# Patient Record
Sex: Male | Born: 1940 | Race: White | Hispanic: No | Marital: Married | Smoking: Former smoker
Health system: Southern US, Community
[De-identification: ages and names within clinical notes are randomized; demographics above are authoritative.]

## PROBLEM LIST (undated history)

## (undated) DIAGNOSIS — M1990 Unspecified osteoarthritis, unspecified site: Secondary | ICD-10-CM

## (undated) DIAGNOSIS — I2510 Atherosclerotic heart disease of native coronary artery without angina pectoris: Secondary | ICD-10-CM

## (undated) DIAGNOSIS — E291 Testicular hypofunction: Secondary | ICD-10-CM

## (undated) DIAGNOSIS — N529 Male erectile dysfunction, unspecified: Secondary | ICD-10-CM

## (undated) DIAGNOSIS — I739 Peripheral vascular disease, unspecified: Secondary | ICD-10-CM

## (undated) DIAGNOSIS — E119 Type 2 diabetes mellitus without complications: Secondary | ICD-10-CM

## (undated) DIAGNOSIS — I10 Essential (primary) hypertension: Secondary | ICD-10-CM

## (undated) DIAGNOSIS — E785 Hyperlipidemia, unspecified: Secondary | ICD-10-CM

## (undated) HISTORY — DX: Atherosclerotic heart disease of native coronary artery without angina pectoris: I25.10

## (undated) HISTORY — DX: Testicular hypofunction: E29.1

## (undated) HISTORY — DX: Essential (primary) hypertension: I10

## (undated) HISTORY — DX: Hyperlipidemia, unspecified: E78.5

## (undated) HISTORY — DX: Type 2 diabetes mellitus without complications: E11.9

## (undated) HISTORY — DX: Peripheral vascular disease, unspecified: I73.9

## (undated) HISTORY — DX: Unspecified osteoarthritis, unspecified site: M19.90

## (undated) HISTORY — DX: Male erectile dysfunction, unspecified: N52.9

---

## 2003-05-20 HISTORY — PX: INGUINAL HERNIA REPAIR: SUR1180

## 2004-01-03 ENCOUNTER — Encounter: Admission: RE | Admit: 2004-01-03 | Discharge: 2004-01-03 | Payer: Self-pay | Admitting: Surgery

## 2004-01-05 ENCOUNTER — Ambulatory Visit (HOSPITAL_COMMUNITY): Admission: RE | Admit: 2004-01-05 | Discharge: 2004-01-05 | Payer: Self-pay | Admitting: Surgery

## 2004-01-05 ENCOUNTER — Ambulatory Visit (HOSPITAL_BASED_OUTPATIENT_CLINIC_OR_DEPARTMENT_OTHER): Admission: RE | Admit: 2004-01-05 | Discharge: 2004-01-05 | Payer: Self-pay | Admitting: Surgery

## 2004-12-26 ENCOUNTER — Inpatient Hospital Stay (HOSPITAL_COMMUNITY): Admission: EM | Admit: 2004-12-26 | Discharge: 2005-01-01 | Payer: Self-pay | Admitting: Emergency Medicine

## 2004-12-26 HISTORY — PX: CARDIAC CATHETERIZATION: SHX172

## 2004-12-27 HISTORY — PX: CORONARY ARTERY BYPASS GRAFT: SHX141

## 2005-01-27 ENCOUNTER — Encounter
Admission: RE | Admit: 2005-01-27 | Discharge: 2005-01-27 | Payer: Self-pay | Admitting: Thoracic Surgery (Cardiothoracic Vascular Surgery)

## 2005-12-13 IMAGING — CR DG CHEST 2V
2 series · 2 of 2 positions shown · non-contrast
Comparison: none

CLINICAL DATA: 63 year old with bypass surgery.
 TWO VIEW CHEST:
 Two views of the chest are compared to prior films from 12/31/04 demonstrating stable surgical changes related to double bypass surgery.   Much improved basilar aeration with resolution of atelectasis.  No effusions are seen.

[view not recorded (1 of 2)]
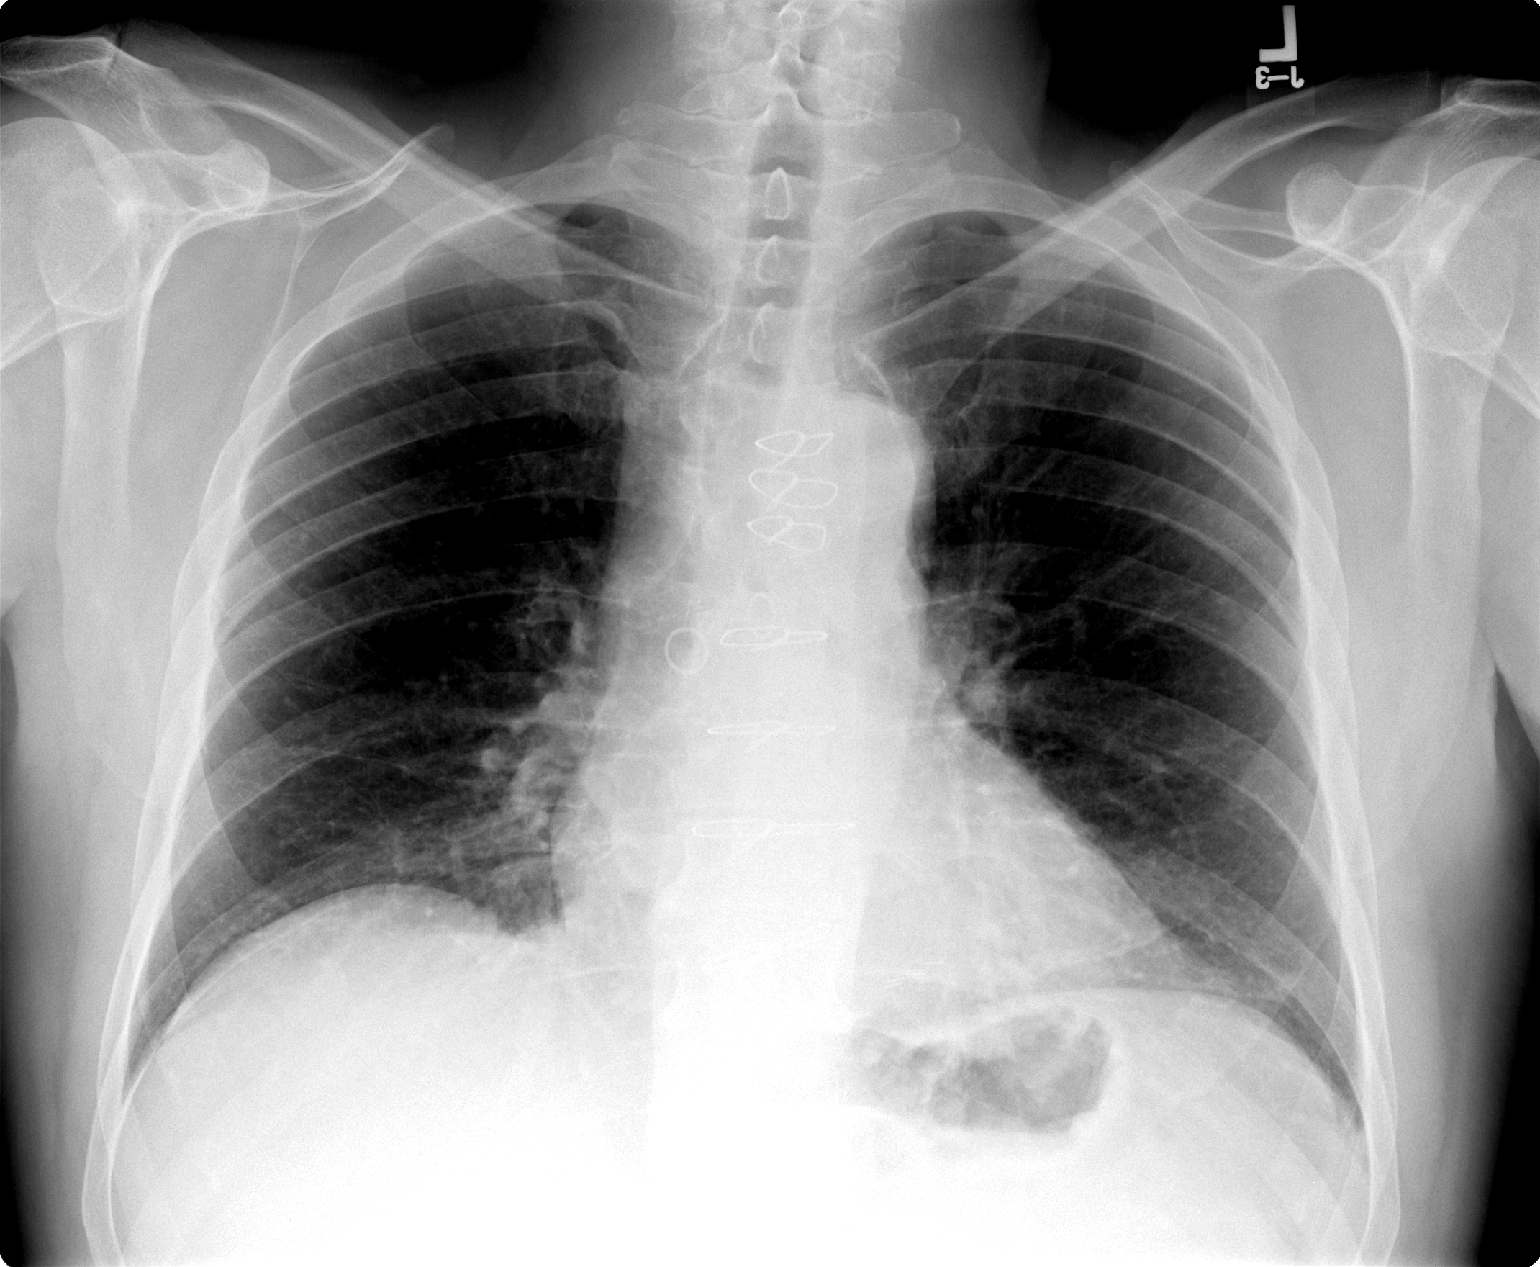

[view not recorded (2 of 2)]
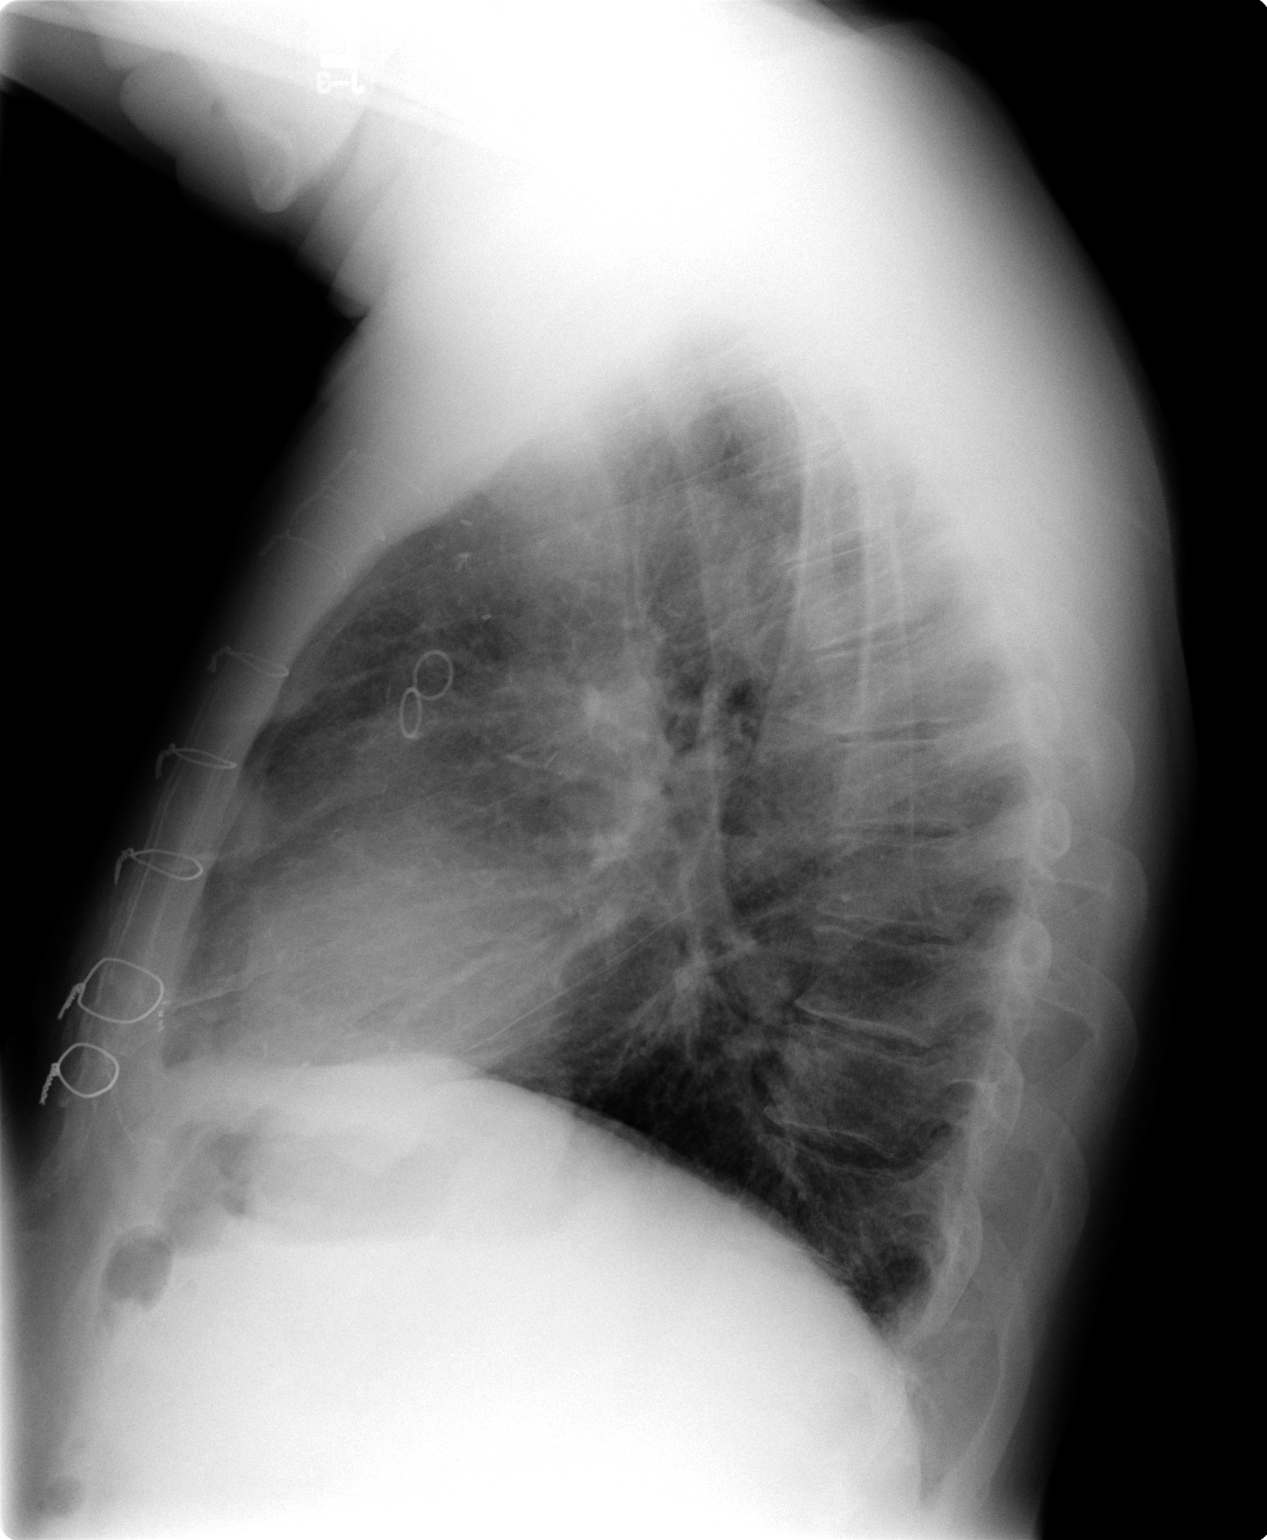

[2 of 2 positions shown; findings below may reference images not displayed]

IMPRESSION: 1.  Surgical changes related to bypass surgery.
 2.  Resolution of basilar atelectasis.
 3.  No edema or infiltrate.

## 2011-06-27 DIAGNOSIS — I739 Peripheral vascular disease, unspecified: Secondary | ICD-10-CM

## 2011-06-27 DIAGNOSIS — I2510 Atherosclerotic heart disease of native coronary artery without angina pectoris: Secondary | ICD-10-CM

## 2011-06-27 HISTORY — DX: Peripheral vascular disease, unspecified: I73.9

## 2011-06-27 HISTORY — DX: Atherosclerotic heart disease of native coronary artery without angina pectoris: I25.10

## 2012-09-30 ENCOUNTER — Encounter: Payer: Self-pay | Admitting: Cardiovascular Disease

## 2012-10-01 ENCOUNTER — Ambulatory Visit (INDEPENDENT_AMBULATORY_CARE_PROVIDER_SITE_OTHER): Payer: PRIVATE HEALTH INSURANCE | Admitting: Cardiovascular Disease

## 2012-10-01 ENCOUNTER — Encounter: Payer: Self-pay | Admitting: Cardiovascular Disease

## 2012-10-01 VITALS — BP 130/80 | HR 67 | Ht 72.0 in | Wt 221.0 lb

## 2012-10-01 DIAGNOSIS — I10 Essential (primary) hypertension: Secondary | ICD-10-CM | POA: Insufficient documentation

## 2012-10-01 DIAGNOSIS — E785 Hyperlipidemia, unspecified: Secondary | ICD-10-CM | POA: Insufficient documentation

## 2012-10-01 DIAGNOSIS — Z951 Presence of aortocoronary bypass graft: Secondary | ICD-10-CM | POA: Insufficient documentation

## 2012-10-01 DIAGNOSIS — I2510 Atherosclerotic heart disease of native coronary artery without angina pectoris: Secondary | ICD-10-CM

## 2012-10-01 DIAGNOSIS — I739 Peripheral vascular disease, unspecified: Secondary | ICD-10-CM

## 2012-10-01 NOTE — Assessment & Plan Note (Signed)
The patient denies claudication. He does have symptoms of sciatica. His left lower a Doppler study performed in our office 06/27/11 revealed a right ABI of 1.1 and a left of 0.89 with a high-frequency signal in his proximal left SFA. We will repeat lower extremity arterial Dopplers

## 2012-10-01 NOTE — Assessment & Plan Note (Signed)
Intolerant to statin drugs. His last lipid profile performed in October of last year revealed a total cholesterol of 206, LDL of 140, HDL of 36.

## 2012-10-01 NOTE — Assessment & Plan Note (Signed)
The patient had a nonischemic Myoview 06/27/11 he denies chest pain or shortness of breath. Continue medical therapy will be recommended.

## 2012-10-01 NOTE — Progress Notes (Signed)
10/01/2012 [name]   April 30, 1941  [id number]  Primary Physician Richard Puffer, MD Primary Cardiologist: [name] M.D. FACC  HPI:  The patient returns today for 58-month followup. He is accompanied by his wife today. 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 Ashley Lloyd December 27, 2004, for left main 3-vessel disease. He had a LIMA to his LAD, a vein to his circumflex marginal branch as well as to the PDA. His other problems include hypertension and hyperlipidemia. He denies chest pain or shortness of breath. He does have left ABI in the 0.89 range with a high-frequency signal in his left SFA but denies claudication. He does have what sounds like sciatica in his right hip. His last lipid profile performed in March revealed a total cholesterol 191, LDL of 130 and HDL of 35. He did have side effects to his Crestor. I had started him on Pravachol because of the abnormal profile which the edition was intolerant to chest pain or shortness of breath. His last Myoview performed a year ago is nonischemic.    Current Outpatient Prescriptions  Medication Sig Dispense Refill  . aspirin 81 MG tablet Take 81 mg by mouth daily.      Marland Kitchen lisinopril (PRINIVIL,ZESTRIL) 20 MG tablet Take 20 mg by mouth daily.      . metoprolol (LOPRESSOR) 50 MG tablet Take 50 mg by mouth at bedtime.      . Omega-3 Fatty Acids (FISH OIL) 1200 MG CAPS Take 1,200 capsules by mouth every morning.      . Testosterone (AXIRON) 30 MG/ACT SOLN Place 30 mg onto the skin 2 (two) times daily.       No current facility-administered medications for this visit.    Allergies  Allergen Reactions  . Crestor (Rosuvastatin) Other (See Comments)    Myalgias    History   Social History  . Marital Status: Legally Separated    Spouse Name: N/A    Number of Children: N/A  . Years of Education: N/A    Occupational History  . Not on file.   Social History Main Topics  . Smoking status: Former Smoker -- 1.00 packs/day for 50 years    Types: Cigarettes    Quit date: 12/17/2004  . Smokeless tobacco: Not on file  . Alcohol Use: Yes  . Drug Use: Not on file  . Sexually Active: Not on file   Other Topics Concern  . Not on file   Social History Narrative  . No narrative on file     Review of Systems: General: negative for chills, fever, night sweats or weight changes.  Cardiovascular: negative for chest pain, dyspnea on exertion, edema, orthopnea, palpitations, paroxysmal nocturnal dyspnea or shortness of breath Dermatological: negative for rash Respiratory: negative for cough or wheezing Urologic: negative for hematuria Abdominal: negative for nausea, vomiting, diarrhea, bright red blood per rectum, melena, or hematemesis Neurologic: negative for visual changes, syncope, or dizziness All other systems reviewed and are otherwise negative except as noted above.    Blood pressure 130/80, pulse 67, height 6' (1.829 m), weight 221 lb (100.245 kg).  General appearance: alert and no distress Neck: no adenopathy, no carotid bruit, no JVD, supple, symmetrical, trachea midline and thyroid not enlarged, symmetric, no tenderness/mass/nodules Lungs: clear to auscultation bilaterally Heart: regular rate and rhythm, S1, S2 normal, no murmur, click, rub or gallop Extremities: extremities  normal, atraumatic, no cyanosis or edema  EKG normal sinus rhythm at 67 without ST or T wave changes  ASSESSMENT AND PLAN:   CAD in native artery The patient had a nonischemic Myoview 06/27/11 he denies chest pain or shortness of breath. Continue medical therapy will be recommended.  HTN (hypertension) Well-controlled on current medications  Hyperlipidemia Intolerant to statin drugs. His last lipid profile performed in October of last year revealed a total cholesterol of 206, LDL of 140, HDL of  36.  Peripheral vascular disease The patient denies claudication. He does have symptoms of sciatica. His left lower a Doppler study performed in our office 06/27/11 revealed a right ABI of 1.1 and a left of 0.89 with a high-frequency signal in his proximal left SFA. We will repeat lower extremity arterial Dopplers      [name] JMD, [name], FSCAI 10/01/2012 8:56 AM

## 2012-10-01 NOTE — Patient Instructions (Signed)
  Your physician wants you to follow-up in: 6 MONTHS WITH AN EXTENDER AND 12 MONTHS WITH DR Allyson Sabal. You will receive a reminder letter in the mail one month in advance. If you don't receive a letter, please call our office to schedule the follow-up appointment.   NO CHANGES IN YOUR MEDICATION   Your physician has ordered the following tests: LOWER EXTREMITY DOPPLER

## 2012-10-01 NOTE — Assessment & Plan Note (Signed)
Well-controlled on current medications 

## 2012-10-20 ENCOUNTER — Ambulatory Visit (HOSPITAL_COMMUNITY)
Admission: RE | Admit: 2012-10-20 | Discharge: 2012-10-20 | Disposition: A | Discharge status: Home / Self Care | Payer: PRIVATE HEALTH INSURANCE | Source: Ambulatory Visit | Attending: Cardiovascular Disease | Admitting: Cardiovascular Disease

## 2012-10-20 DIAGNOSIS — I739 Peripheral vascular disease, unspecified: Secondary | ICD-10-CM | POA: Insufficient documentation

## 2012-10-20 NOTE — Progress Notes (Signed)
Arterial Duplex Completed. [name]  

## 2012-10-25 ENCOUNTER — Telehealth: Payer: Self-pay | Admitting: Cardiovascular Disease

## 2012-10-25 NOTE — Telephone Encounter (Signed)
Message forwarded to K. Vogel, RN.  

## 2012-10-25 NOTE — Telephone Encounter (Signed)
lmom 

## 2012-10-25 NOTE — Telephone Encounter (Signed)
Would like doppler test results from Wednesday please

## 2012-10-27 NOTE — Telephone Encounter (Signed)
Returning your call. °

## 2012-11-01 ENCOUNTER — Encounter: Payer: Self-pay | Admitting: *Deleted

## 2012-11-01 ENCOUNTER — Telehealth: Payer: Self-pay | Admitting: *Deleted

## 2012-11-01 DIAGNOSIS — I739 Peripheral vascular disease, unspecified: Secondary | ICD-10-CM

## 2012-11-01 NOTE — Telephone Encounter (Signed)
Message copied by Marella Bile on Mon Nov 01, 2012 11:16 AM ------      Message from: [name]      Created: Mon Oct 25, 2012  8:00 PM       No change from prior study. Repeat in 12 months. ------

## 2012-11-01 NOTE — Telephone Encounter (Signed)
Order placed for repeat doppler in 1 year 

## 2012-11-01 NOTE — Telephone Encounter (Signed)
Letter mailed with results. 

## 2013-05-27 ENCOUNTER — Ambulatory Visit (INDEPENDENT_AMBULATORY_CARE_PROVIDER_SITE_OTHER): Payer: PRIVATE HEALTH INSURANCE | Admitting: Cardiovascular Disease

## 2013-05-27 ENCOUNTER — Encounter: Payer: Self-pay | Admitting: Cardiovascular Disease

## 2013-05-27 VITALS — BP 130/80 | HR 78 | Ht 72.0 in | Wt 221.0 lb

## 2013-05-27 DIAGNOSIS — I2510 Atherosclerotic heart disease of native coronary artery without angina pectoris: Secondary | ICD-10-CM

## 2013-05-27 DIAGNOSIS — Z79899 Other long term (current) drug therapy: Secondary | ICD-10-CM

## 2013-05-27 DIAGNOSIS — E785 Hyperlipidemia, unspecified: Secondary | ICD-10-CM

## 2013-05-27 NOTE — Progress Notes (Signed)
Patient ID: [name], male   DOB: 04-12-1941, 73 y.o.   MRN: [id number]  Chief Complaint  Patient presents with  . Follow-up    ROV 6 months, no chest pain  . Tingling    left arm that radiates down to the fingers    HPI [name] is a 73 y.o. male with h/o CAD (status post coronary artery bypass grafting by Dr. [name] December 27, 2004, for left main 3-vessel disease. He had a LIMA to his LAD, a vein to his circumflex marginal branch as well as to the PDA). He also has h/o PVD (left ABI:0.89 with high frequency signal in left SFA, but asymptomatic), HTN and hyperlipidemia.   He reports feeling well. He denies any chest pain, shortness of breath, palpitations or lower extremity edema. He denies any claudication. 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 He doesn't exercise on a regular basis.  He has not been taking pravastatin for over a year due to myalgias. Additionally, his lisinopril was stopped a year ago due to persistent cough.   Last lexiscan myoview was 06/27/11 which was normal.  Last lipid panel was on 03/03/2012 and showed LDL: 140, HDL: 36, trig: 148 and total chol: 206 Past Medical History  Diagnosis Date  . Coronary artery disease 06/27/2011    Normal Lexiscan, EF 64, post-stress EF 64, no EKG changes  . Hyperlipidemia   . Hypertension   . Claudication 06/27/2011    Left ABIs abnormal values with mild-moderate arterial insufficiency, right SFA moderate amount of mixed density plaque elevating velocities suggestive 50-69% diameter reduction  . PVD (peripheral vascular disease)   . Osteoarthritis   . Impotence of organic origin   . Other testicular hypofunction     Past Surgical History  Procedure Laterality Date  . Cardiac catheterization  12/26/2004    Left main-90% distal tapered stenosis, left circumflex-95% ostial stenosis, first marginal branch 99% ostial branch, posterior descending artery 99% ostial stenosis, 60-70% segmental mid stenosis, needed CABG  .  Inguinal hernia repair Left   . Coronary artery bypass graft  12/27/2004    Left main 3-vessel disease, LIMA to LAD, saphenous vein to circumflexmarginal branch and to the PDA    Family History  Problem Relation Age of Onset  . Hypertension Mother     67  . Clotting disorder Father     77    Social History History  Substance Use Topics  . Smoking status: Former Smoker -- 1.00 packs/day for 50 years    Types: Cigarettes    Quit date: 12/17/2004  . Smokeless tobacco: Not on file  . Alcohol Use: Yes    Allergies  Allergen Reactions  . Crestor [Rosuvastatin] Other (See Comments)    Myalgias  . Lisinopril     cough    Current Outpatient Prescriptions  Medication Sig Dispense Refill  . aspirin 81 MG tablet Take 81 mg by mouth daily.      . metoprolol (LOPRESSOR) 50 MG tablet Take 50 mg by mouth at bedtime.      . naproxen sodium (ANAPROX) 220 MG tablet Take 440 mg by mouth at bedtime.      . Omega-3 Fatty Acids (FISH OIL) 1200 MG CAPS Take 1,200 capsules by mouth every morning.      . Testosterone (AXIRON) 30 MG/ACT SOLN Place 30 mg onto the skin 2 (two) times daily.      . Vitamin D, Ergocalciferol, (DRISDOL) 50000 UNITS CAPS capsule Take 50,000 Units  by mouth every 7 (seven) days.       No current facility-administered medications for this visit.    Review of Systems Review of Systems Negative except per HPI Blood pressure 130/80, pulse 78, height 6' (1.829 m), weight 221 lb (100.245 kg).  Physical Exam Physical Exam General: no acute distress, alert and oriented x3, pleasant elderly white male HEENT: no JVD, no carotid murmur appreciated CV: S1S2, RRR, no murmur appreciated Pulm: clear to auscultation bilaterally, normal work of breathing, no wheezing or crackles.  Extr: no edema, dorsalis pedis pulse difficult to palpate bilaterally Data Reviewed EKG: normal sinus rhythm rate 78  Assessment/Plan:  73 y.o. male with h/o CAD, asymptomatic PVD, HTN and  hyperlipidemia.    1. H/o CAD: s/p CABG in 2006. Asymptomatic. Not currently on statin due to intolerance.  - recheck lipid panel with LFTs - repeat stress test in 1 year (last one in 06/2011 and normal)  2. Hypertension:  - controled on metoprolol 50mg   - acei stopped due to cough  3. Hyperlipidemia: not on medical therapy due to intolerance - repeat lipid panel  4. PVD: asymptomatic - continue to monitor - continue ASA   Follow up in 6-8 months        Suzanne Garbers 05/27/2013, 11:02 AM Family Medicine Resident, PGY-3  I have seen examined and evaluated the patient with Dr. Gwenlyn SaranLosq and agree with her assessment and plan.  Runell GessJonathan J. Berry, M.D., FACP, [facility]CFACC, Earl LagosFAHA, [facility] HospitalFSCAI [facility] [address] [place], [place]  [address]  [phone] 05/27/2013 4:10 PM

## 2013-05-27 NOTE — Patient Instructions (Signed)
  We will see you back in follow up in November 2015  Dr Allyson SabalBerry has ordered blood work to be done at your convenience, fasting

## 2013-06-03 LAB — HEPATIC FUNCTION PANEL
ALBUMIN: 4 g/dL (ref 3.5–5.2)
ALK PHOS: 68 U/L (ref 39–117)
ALT: 20 U/L (ref 0–53)
AST: 21 U/L (ref 0–37)
BILIRUBIN INDIRECT: 1.2 mg/dL — AB (ref 0.0–0.9)
BILIRUBIN TOTAL: 1.5 mg/dL — AB (ref 0.3–1.2)
Bilirubin, Direct: 0.3 mg/dL (ref 0.0–0.3)
TOTAL PROTEIN: 6.9 g/dL (ref 6.0–8.3)

## 2013-06-03 LAB — LIPID PANEL
CHOL/HDL RATIO: 5.4 ratio
Cholesterol: 161 mg/dL (ref 0–200)
HDL: 30 mg/dL — ABNORMAL LOW (ref >39)
LDL Cholesterol: 114 mg/dL — ABNORMAL HIGH (ref 0–99)
Triglycerides: 84 mg/dL (ref <150)
VLDL: 17 mg/dL (ref 0–40)

## 2013-10-19 ENCOUNTER — Telehealth (HOSPITAL_COMMUNITY): Payer: Self-pay | Admitting: *Deleted

## 2013-11-01 ENCOUNTER — Telehealth (HOSPITAL_COMMUNITY): Payer: Self-pay | Admitting: *Deleted

## 2016-06-30 ENCOUNTER — Encounter: Payer: Self-pay | Admitting: *Deleted

## 2016-07-01 ENCOUNTER — Ambulatory Visit (INDEPENDENT_AMBULATORY_CARE_PROVIDER_SITE_OTHER): Payer: Medicare HMO | Admitting: Diagnostic Neuroimaging

## 2016-07-01 ENCOUNTER — Encounter: Payer: Self-pay | Admitting: Diagnostic Neuroimaging

## 2016-07-01 VITALS — BP 163/74 | HR 61 | Ht 73.0 in | Wt 222.4 lb

## 2016-07-01 DIAGNOSIS — I739 Peripheral vascular disease, unspecified: Secondary | ICD-10-CM

## 2016-07-01 DIAGNOSIS — M79605 Pain in left leg: Secondary | ICD-10-CM | POA: Diagnosis not present

## 2016-07-01 DIAGNOSIS — M5416 Radiculopathy, lumbar region: Secondary | ICD-10-CM | POA: Diagnosis not present

## 2016-07-01 DIAGNOSIS — G629 Polyneuropathy, unspecified: Secondary | ICD-10-CM | POA: Diagnosis not present

## 2016-07-01 DIAGNOSIS — M79604 Pain in right leg: Secondary | ICD-10-CM

## 2016-07-01 NOTE — Patient Instructions (Addendum)
Thank you for coming to see [place] at [facility]. I hope we have been able to provide you high quality care today.  You may receive a patient satisfaction survey over the next few weeks. We would appreciate your feedback and comments so that we may continue to improve ourselves and the health of our patients.  - I will check additional testing (MRI lumbar spine, lab testing)   ~~~~~~~~~~~~~~~~~~~~~~~~~~~~~~~~~~~~~~~~~~~~~~~~~~~~~~~~~~~~~~~~~  DR. PENUMALLI'S GUIDE TO HAPPY AND HEALTHY LIVING These are some of my general health and wellness recommendations. Some of them may apply to you better than others. Please use common sense as you try these suggestions and feel free to ask me any questions.   ACTIVITY/FITNESS Mental, social, emotional and physical stimulation are very important for brain and body health. Try learning a new activity (arts, music, language, sports, games).  Keep moving your body to the best of your abilities. You can do this at home, inside or outside, the park, community center, gym or anywhere you like. Consider a physical therapist or personal trainer to get started. Consider the app Sworkit. Fitness trackers such as smart-watches, smart-phones or Fitbits can help as well.   NUTRITION Eat more plants: colorful vegetables, nuts, seeds and berries.  Eat less sugar, salt, preservatives and processed foods.  Avoid toxins such as cigarettes and alcohol.  Drink water when you are thirsty. Warm water with a slice of lemon is an excellent morning drink to start the day.  Consider these websites for more information The Nutrition Source (https://www.henry-hernandez.biz/) Precision Nutrition (WindowBlog.ch)   RELAXATION Consider practicing mindfulness meditation or other relaxation techniques such as deep breathing, prayer, yoga, tai chi, massage. See website mindful.org or the apps Headspace or Calm to help get  started.   SLEEP Try to get at least 7-8+ hours sleep per day. Regular exercise and reduced caffeine will help you sleep better. Practice good sleep hygeine techniques. See website sleep.org for more information.   PLANNING Prepare estate planning, living will, healthcare POA documents. Sometimes this is best planned with the help of an attorney. Theconversationproject.org and agingwithdignity.org are excellent resources.

## 2016-07-01 NOTE — Progress Notes (Signed)
[facility]  PATIENT: [name] DOB: Sep 22, 1940  REFERRING CLINICIAN: Kristopher Oppenheim, MD HISTORY FROM: patient  REASON FOR VISIT: new consult    HISTORICAL  CHIEF COMPLAINT:  Chief Complaint  Patient presents with  . Pain    rm 7, New Pt, "pain in legs, feet x 1 year; worse with walking, comes and goes"    HISTORY OF PRESENT ILLNESS:   76 year old male here for evaluation of lower extremity pain, numbness and tingling. Symptoms of been present for at least one year, intermittent, worse when he is walking. He walks up to 1000 feet symptoms start to aggravate and occur. He has history of peripheral vascular disease followed by cardiology in the past. Patient having more numbness and tingling. Patient has had some low back pain radiating to his legs and calves in the past. No other aggravating or triggering factors. No alleviating factors.   REVIEW OF SYSTEMS: Full 14 system review of systems performed and negative with exception of: Only as per history of present illness.  ALLERGIES: Allergies  Allergen Reactions  . Crestor [Rosuvastatin] Other (See Comments)    Myalgias  . Lisinopril     cough    HOME MEDICATIONS: Outpatient Medications Prior to Visit  Medication Sig Dispense Refill  . aspirin 81 MG tablet Take 81 mg by mouth daily.    . metoprolol (LOPRESSOR) 50 MG tablet Take 50 mg by mouth at bedtime.    . naproxen sodium (ANAPROX) 220 MG tablet Take 440 mg by mouth at bedtime.    . Omega-3 Fatty Acids (FISH OIL) 1200 MG CAPS Take 1,200 capsules by mouth every morning.    . Vitamin D, Ergocalciferol, (DRISDOL) 50000 UNITS CAPS capsule Take 50,000 Units by mouth every 7 (seven) days.    . Testosterone (AXIRON) 30 MG/ACT SOLN Place 30 mg onto the skin 2 (two) times daily.     No facility-administered medications prior to visit.     PAST MEDICAL HISTORY: Past Medical History:  Diagnosis Date  . Claudication (HCC) 06/27/2011   Left ABIs abnormal  values with mild-moderate arterial insufficiency, right SFA moderate amount of mixed density plaque elevating velocities suggestive 50-69% diameter reduction  . Coronary artery disease 06/27/2011   Normal Lexiscan, EF 64, post-stress EF 64, no EKG changes  . Hyperlipidemia   . Hypertension   . Impotence of organic origin   . Osteoarthritis   . Other testicular hypofunction   . PVD (peripheral vascular disease) (HCC)     PAST SURGICAL HISTORY: Past Surgical History:  Procedure Laterality Date  . CARDIAC CATHETERIZATION  12/26/2004   Left main-90% distal tapered stenosis, left circumflex-95% ostial stenosis, first marginal branch 99% ostial branch, posterior descending artery 99% ostial stenosis, 60-70% segmental mid stenosis, needed CABG  . CORONARY ARTERY BYPASS GRAFT  12/27/2004   Left main 3-vessel disease, LIMA to LAD, saphenous vein to circumflexmarginal branch and to the PDA  . INGUINAL HERNIA REPAIR Left 2005    FAMILY HISTORY: Family History  Problem Relation Age of Onset  . Hypertension Mother     14  . Clotting disorder Father     81  . Diabetes Brother     heart surgery    SOCIAL HISTORY:  Social History   Social History  . Marital status: Married    Spouse name: cathy  . Number of children: 1  . Years of education: 21   Occupational History  . retired    Social History Main Topics  .  Smoking status: Former Smoker    Packs/day: 1.00    Years: 50.00    Types: Cigarettes    Quit date: 12/17/2004  . Smokeless tobacco: Never Used  . Alcohol use Yes     Comment: occasionally  . Drug use: No  . Sexual activity: Not on file   Other Topics Concern  . Not on file   Social History Narrative   Lives with wife   Caffeine - coffee, 1 cup daily     PHYSICAL EXAM  GENERAL EXAM/CONSTITUTIONAL: Vitals:  Vitals:   07/01/16 0747  BP: (!) 163/74  Pulse: 61  Weight: 222 lb 6.4 oz (100.9 kg)  Height: 6\' 1"  (1.854 m)     Body mass index is 29.34  kg/m.  Visual Acuity Screening   Right eye Left eye Both eyes  Without correction: 20/40 20/40   With correction:        Patient is in no distress; well developed, nourished and groomed; neck is supple  STRAIGHT LEG RAISE NEGATIVE  FABER TESTING NEGATIVE  CARDIOVASCULAR:  Examination of carotid arteries is normal; no carotid bruits  Regular rate and rhythm, no murmurs  Examination of peripheral vascular system by observation and palpation is normal  EXCEPT DECR DORSIS PEDIS AND POSTERIOR TIBIAL PULSES BY PALPATION BILATERALLY  EYES:  Ophthalmoscopic exam of optic discs and posterior segments is normal; no papilledema or hemorrhages  MUSCULOSKELETAL:  Gait, strength, tone, movements noted in Neurologic exam below  NEUROLOGIC: MENTAL STATUS:  No flowsheet data found.  awake, alert, oriented to person, place and time  recent and remote memory intact  normal attention and concentration  language fluent, comprehension intact, naming intact,   fund of knowledge appropriate  CRANIAL NERVE:   2nd - no papilledema on fundoscopic exam  2nd, 3rd, 4th, 6th - pupils equal and reactive to light, visual fields full to confrontation, extraocular muscles intact, no nystagmus  5th - facial sensation symmetric  7th - facial strength symmetric  8th - hearing intact  9th - palate elevates symmetrically, uvula midline  11th - shoulder shrug symmetric  12th - tongue protrusion midline  MOTOR:   normal bulk and tone, full strength in the BUE, BLE  SENSORY:   normal and symmetric to light touch, pinprick, temperature, vibration EXCEPT  DECR PP, TEMP AND VIB AT TOES AND FEET  COORDINATION:   finger-nose-finger, fine finger movements SLOW  REFLEXES:   deep tendon reflexes present --> BUE 1, KNEES 2, RIGHT ANKLE 1, LEFT ANKLE TRACE  DOWN GOING TOES  GAIT/STATION:   SLIGHTLY CAUTIOUS GAIT; narrow based gait; able to walk on toes and heels; romberg is  negative    DIAGNOSTIC DATA (LABS, IMAGING, TESTING) - I reviewed patient records, labs, notes, testing and imaging myself where available.  No results found for: WBC, HGB, HCT, MCV, PLT    Component Value Date/Time   PROT 6.9 06/03/2013 0956   ALBUMIN 4.0 06/03/2013 0956   AST 21 06/03/2013 0956   ALT 20 06/03/2013 0956   ALKPHOS 68 06/03/2013 0956   BILITOT 1.5 (H) 06/03/2013 0956   Lab Results  Component Value Date   CHOL 161 06/03/2013   HDL 30 (L) 06/03/2013   LDLCALC 114 (H) 06/03/2013   TRIG 84 06/03/2013   CHOLHDL 5.4 06/03/2013   No results found for: HGBA1C No results found for: VITAMINB12 No results found for: TSH  10/20/12 ABI u/s  - Right and left superficial femoral artery: Moderate mixed density plaque suggestive of  50-69% stenosis - Anterior tibial artery demonstrates occlusive disease with reconstitution of flow in dorsalis pedis artery - Posterior tibial artery demonstrates occlusive disease with reconstitution of flow distally.    ASSESSMENT AND PLAN  76 y.o. year old male here with Here for evaluation of lower extremity pain and numbness for the past one year, triggered by walking up to 1000 feet. Will check additional testing.  Ddx: lumbar spinal stenosis, peripheral neuropathy, myopathy/myalgia, peripheral vascular disease  1. Lumbar radiculopathy   2. Peripheral vascular disease (HCC)   3. Pain in both lower extremities   4. Neuropathy (HCC)      PLAN: - check additional testing (MRI lumbar spine and labs) - follow up with Dr. Allyson SabalBerry re: peripheral vascular disease (last seen in 2014-2015) - conservative mgmt of pain; may consider stretching, physical therapy and OTC tylenol or ibuprofen  Orders Placed This Encounter  Procedures  . MR LUMBAR SPINE WO CONTRAST  . CK  . Aldolase  . Vitamin B12  . Hemoglobin A1c  . TSH  . Ambulatory referral to Cardiology   Return in about 2 months (around 08/29/2016).    Suanne MarkerVIKRAM R. Gilda Abboud, MD  07/01/2016, 8:17 AM Certified in Neurology, Neurophysiology and Neuroimaging  [facility] [address], [address] [place], [place] [address] [phone]

## 2016-07-02 LAB — HEMOGLOBIN A1C
Est. average glucose Bld gHb Est-mCnc: 134 mg/dL
HEMOGLOBIN A1C: 6.3 % — AB (ref 4.8–5.6)

## 2016-07-02 LAB — ALDOLASE: Aldolase: 6 U/L (ref 3.3–10.3)

## 2016-07-02 LAB — CK: CK TOTAL: 148 U/L (ref 24–204)

## 2016-07-02 LAB — VITAMIN B12: VITAMIN B 12: 340 pg/mL (ref 232–1245)

## 2016-07-02 LAB — TSH: TSH: 1.15 u[IU]/mL (ref 0.450–4.500)

## 2016-07-03 ENCOUNTER — Telehealth: Payer: Self-pay | Admitting: *Deleted

## 2016-07-03 NOTE — Telephone Encounter (Signed)
Called home phone, unable to LVM. Called mobile #, LVM per Dr Marjory LiesPenumalli, advising patient his lab result are nor,mal with the exception of a borderline high A1C. Advised he discuss with his PCP at his next appointment. Left number for any questions.

## 2016-07-11 ENCOUNTER — Other Ambulatory Visit: Payer: Self-pay | Admitting: Diagnostic Neuroimaging

## 2016-07-11 DIAGNOSIS — T1590XA Foreign body on external eye, part unspecified, unspecified eye, initial encounter: Secondary | ICD-10-CM

## 2016-07-17 ENCOUNTER — Encounter: Payer: Self-pay | Admitting: Podiatry

## 2016-07-17 ENCOUNTER — Ambulatory Visit (INDEPENDENT_AMBULATORY_CARE_PROVIDER_SITE_OTHER): Payer: Medicare HMO | Admitting: Podiatry

## 2016-07-17 VITALS — BP 170/100 | HR 70 | Ht 73.0 in | Wt 222.0 lb

## 2016-07-17 DIAGNOSIS — B351 Tinea unguium: Secondary | ICD-10-CM | POA: Diagnosis not present

## 2016-07-17 DIAGNOSIS — L600 Ingrowing nail: Secondary | ICD-10-CM | POA: Diagnosis not present

## 2016-07-17 DIAGNOSIS — M79676 Pain in unspecified toe(s): Secondary | ICD-10-CM

## 2016-07-17 NOTE — Progress Notes (Signed)
   Subjective:    Patient ID: [name], male    DOB: 07-Nov-1940, 76 y.o.   MRN: [id number]  HPI this patient presents to the office with chief complaint of a painful big toenail, left foot. He states the nail was smashed years ago and it has grown thick and frequently falls off on its own. He says the nail is now painful walking and wearing his shoes. He denies any history of drainage or pus. He presents the office today for an evaluation and treatment of this painful toe    Review of Systems  Cardiovascular:       Calf pain with walking  Musculoskeletal: Positive for gait problem.  Skin:       Nail changes       Objective:   Physical Exam GENERAL APPEARANCE: Alert, conversant. Appropriately groomed. No acute distress.  VASCULAR: Pedal pulses are  palpable at  [facility] bilateral.  Capillary refill time is immediate to all digits,  Normal temperature gradient.  Digital hair growth is present bilateral  NEUROLOGIC: sensation is normal to 5.07 monofilament at 5/5 sites bilateral.  Light touch is intact bilateral, Muscle strength normal.  MUSCULOSKELETAL: acceptable muscle strength, tone and stability bilateral.  Intrinsic muscluature intact bilateral.  Rectus appearance of foot and digits noted bilateral.   DERMATOLOGIC: skin color, texture, and turgor are within normal limits.  No preulcerative lesions or ulcers  are seen, no interdigital maceration noted.  No open lesions present.  . No drainage noted.  NAILS  Thick disfigured discolored nails both feet.  His lateral border left hallux is ingrown.           Assessment & Plan:  Onychomycosis   Ingrown Toenail.  IE  Debride nails  B/L  RTC 3 months   Helane GuntherGregory Mayer DPM

## 2016-07-18 ENCOUNTER — Telehealth: Payer: Self-pay | Admitting: Diagnostic Neuroimaging

## 2016-07-18 NOTE — Telephone Encounter (Signed)
Dondra SpryGail at [facility] informed me that Monia Pouchetna did not approve the MR Lumbar.. The case number is [id number]. The phone number to [facility] for the peer to peer is [phone] option 3. They are schedule to have this [facility] done at [facility] on Monday 07/21/16.

## 2016-07-21 ENCOUNTER — Other Ambulatory Visit: Payer: PRIVATE HEALTH INSURANCE

## 2016-07-21 NOTE — Telephone Encounter (Signed)
Sorry; not able to.

## 2016-07-21 NOTE — Telephone Encounter (Signed)
He has his MRI appointment this afternoon at 1:00 pm. Do you think you will be able to do that peer to peer??

## 2016-07-21 NOTE — Telephone Encounter (Signed)
I called for mri lumbar spine approval.  Approval number: [id number]  Suanne MarkerVIKRAM R. Javin Nong, MD 07/21/2016, 4:46 PM Certified in Neurology, Neurophysiology and Neuroimaging  [facility] [address], [address] [place], [place] [address] [phone]

## 2016-07-22 NOTE — Telephone Encounter (Signed)
Noted, thank you

## 2016-07-25 ENCOUNTER — Encounter: Payer: Self-pay | Admitting: Cardiovascular Disease

## 2016-07-25 ENCOUNTER — Ambulatory Visit (INDEPENDENT_AMBULATORY_CARE_PROVIDER_SITE_OTHER): Payer: Medicare HMO | Admitting: Cardiovascular Disease

## 2016-07-25 VITALS — BP 144/76 | HR 66 | Ht 73.0 in | Wt 221.2 lb

## 2016-07-25 DIAGNOSIS — Z01812 Encounter for preprocedural laboratory examination: Secondary | ICD-10-CM | POA: Diagnosis not present

## 2016-07-25 DIAGNOSIS — Z713 Dietary counseling and surveillance: Secondary | ICD-10-CM

## 2016-07-25 DIAGNOSIS — I10 Essential (primary) hypertension: Secondary | ICD-10-CM

## 2016-07-25 DIAGNOSIS — E7800 Pure hypercholesterolemia, unspecified: Secondary | ICD-10-CM

## 2016-07-25 DIAGNOSIS — I739 Peripheral vascular disease, unspecified: Secondary | ICD-10-CM

## 2016-07-25 DIAGNOSIS — I2510 Atherosclerotic heart disease of native coronary artery without angina pectoris: Secondary | ICD-10-CM

## 2016-07-25 LAB — CBC WITH DIFFERENTIAL/PLATELET
BASOS ABS: 0 {cells}/uL (ref 0–200)
Basophils Relative: 0 %
EOS ABS: 188 {cells}/uL (ref 15–500)
Eosinophils Relative: 2 %
HCT: 43.4 % (ref 38.5–50.0)
Hemoglobin: 14.5 g/dL (ref 13.2–17.1)
LYMPHS PCT: 25 %
Lymphs Abs: 2350 cells/uL (ref 850–3900)
MCH: 29.3 pg (ref 27.0–33.0)
MCHC: 33.4 g/dL (ref 32.0–36.0)
MCV: 87.7 fL (ref 80.0–100.0)
MONOS PCT: 8 %
MPV: 11.6 fL (ref 7.5–12.5)
Monocytes Absolute: 752 cells/uL (ref 200–950)
NEUTROS PCT: 65 %
Neutro Abs: 6110 cells/uL (ref 1500–7800)
PLATELETS: 275 10*3/uL (ref 140–400)
RBC: 4.95 MIL/uL (ref 4.20–5.80)
RDW: 14.2 % (ref 11.0–15.0)
WBC: 9.4 10*3/uL (ref 3.8–10.8)

## 2016-07-25 LAB — BASIC METABOLIC PANEL WITH GFR
BUN: 24 mg/dL (ref 7–25)
CALCIUM: 9.4 mg/dL (ref 8.6–10.3)
CO2: 25 mmol/L (ref 20–31)
CREATININE: 0.71 mg/dL (ref 0.70–1.18)
Chloride: 106 mmol/L (ref 98–110)
GFR, Est African American: >89 mL/min (ref >=60)
GFR, Est Non African American: >89 mL/min (ref >=60)
GLUCOSE: 98 mg/dL (ref 65–99)
Potassium: 4.5 mmol/L (ref 3.5–5.3)
Sodium: 140 mmol/L (ref 135–146)

## 2016-07-25 LAB — APTT: aPTT: 29 s (ref 22–34)

## 2016-07-25 LAB — PROTIME-INR
INR: 1
PROTHROMBIN TIME: 11 s (ref 9.0–11.5)

## 2016-07-25 NOTE — Assessment & Plan Note (Signed)
Coronary artery disease status post cardiac catheterization performed by myself 12/26/04 revealing left main/three-vessel disease with preserved left ventricular function. He had coronary artery bypass grafting by Dr.Cub Cornelius Moraswen  12/27/04 with a LIMA to his LAD, a vein to the circumflex marginal branch as well as to his PDA. Since I saw him in the office 4 years ago he has remained asymptomatic.

## 2016-07-25 NOTE — Assessment & Plan Note (Signed)
History of peripheral arterial disease with Doppler performed 10/20/12 revealing a right ABI 1.1 left ABI 0.85. He did have moderately ill with gated velocities at the origin of his left SFA. Since I saw him 4 years ago the claudication has gotten progressively worse. It is symmetric. He has  lifestyle limiting claudication at  less than one block. We will get lower extremity arterial Doppler studies. This reveals progression of disease we have decided to proceed with angiography and potential endovascular therapy.

## 2016-07-25 NOTE — Assessment & Plan Note (Signed)
History of hypertension blood pressure measured 144/76. He is on metoprolol. Continue current meds at current dosing

## 2016-07-25 NOTE — Assessment & Plan Note (Signed)
History of hyperlipidemia not on statin therapy followed by his PCP 

## 2016-07-25 NOTE — Progress Notes (Signed)
07/25/2016 [name]   [birth year]  [id number]  Primary Physician [name], MD Primary Cardiologist: [name] MD Roseanne Reno    HPI:  [name] is a 76 year old mildly overweight married Caucasian male who is accompanied by his wife [name]. I last saw him in the office 10/01/12.Marland Kitchen He is the father of one child, grandfather and one grandchild. 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 [name] December 27, 2004, for left main 3-vessel disease. He had a LIMA to his LAD, a vein to his circumflex marginal branch as well as to the PDA. His other problems include hypertension and hyperlipidemia. He denies chest pain or shortness of breath. He does have left ABI in the 0.85range with a high-frequency signal in his left SFA by duplex ultrasound 10/20/12 but did not claudication at that time. His last Myoview performed 06/27/11 was nonischemic. 4 years ago he developed lifestyle limiting bilateral lower extremity claudication. Should be noted that he did stop smoking at the time of his bypass surgery in 2006.  Current Outpatient Prescriptions  Medication Sig Dispense Refill  . aspirin 81 MG tablet Take 81 mg by mouth daily.    . metoprolol (LOPRESSOR) 50 MG tablet Take 50 mg by mouth at bedtime.    . Omega-3 Fatty Acids (FISH OIL) 1200 MG CAPS Take 1,200 capsules by mouth every morning.    . Testosterone (AXIRON) 30 MG/ACT SOLN Place 30 mg onto the skin 2 (two) times daily.    . Vitamin D, Ergocalciferol, (DRISDOL) 50000 UNITS CAPS capsule Take 50,000 Units by mouth every 7 (seven) days.     No current facility-administered medications for this visit.     Allergies  Allergen Reactions  . Crestor [Rosuvastatin] Other (See Comments)    Myalgias  . Lisinopril     cough    Social History   Social History  . Marital status: Married    Spouse name: cathy  . Number of children: 1  . Years of education: 66   Occupational History  . retired     Social History Main Topics  . Smoking status: Former Smoker    Packs/day: 1.00    Years: 50.00    Types: Cigarettes    Quit date: 12/17/2004  . Smokeless tobacco: Never Used  . Alcohol use Yes     Comment: occasionally  . Drug use: No  . Sexual activity: Not on file   Other Topics Concern  . Not on file   Social History Narrative   Lives with wife   Caffeine - coffee, 1 cup daily     Review of Systems: General: negative for chills, fever, night sweats or weight changes.  Cardiovascular: negative for chest pain, dyspnea on exertion, edema, orthopnea, palpitations, paroxysmal nocturnal dyspnea or shortness of breath Dermatological: negative for rash Respiratory: negative for cough or wheezing Urologic: negative for hematuria Abdominal: negative for nausea, vomiting, diarrhea, bright red blood per rectum, melena, or hematemesis Neurologic: negative for visual changes, syncope, or dizziness All other systems reviewed and are otherwise negative except as noted above.    Blood pressure (!) 144/76, pulse 66, height 6\' 1"  (1.854 m), weight 221 lb 3.2 oz (100.3 kg).  General appearance: alert and no distress Neck: no adenopathy, no carotid bruit, no JVD, supple, symmetrical, trachea midline and thyroid not enlarged, symmetric, no tenderness/mass/nodules Lungs: clear to auscultation bilaterally Heart: regular rate and rhythm, S1, S2 normal, no murmur, click,  rub or gallop Extremities: extremities normal, atraumatic, no cyanosis or edema  EKG sinus rhythm at 66 without ST or T-wave changes. I personally reviewed this EKG.  ASSESSMENT AND PLAN:   CAD in native artery Coronary artery disease status post cardiac catheterization performed by myself 12/26/04 revealing left main/three-vessel disease with preserved left ventricular function. He had coronary artery bypass grafting by Dr.Cub Cornelius Moraswen  12/27/04 with a LIMA to his LAD, a vein to the circumflex marginal branch as well as to his PDA.  Since I saw him in the office 4 years ago he has remained asymptomatic.  HTN (hypertension) History of hypertension blood pressure measured 144/76. He is on metoprolol. Continue current meds at current dosing  Hyperlipidemia History of hyperlipidemia not on statin therapy followed by his PCP  Peripheral vascular disease History of peripheral arterial disease with Doppler performed 10/20/12 revealing a right ABI 1.1 left ABI 0.85. He did have moderately ill with gated velocities at the origin of his left SFA. Since I saw him 4 years ago the claudication has gotten progressively worse. It is symmetric. He has  lifestyle limiting claudication at  less than one block. We will get lower extremity arterial Doppler studies. This reveals progression of disease we have decided to proceed with angiography and potential endovascular therapy.      Runell GessJonathan J. Shernita Rabinovich MD FACP,FACC,FAHA, [facility] 07/25/2016 8:49 AM

## 2016-07-25 NOTE — Patient Instructions (Signed)
Medication Instructions: Your physician recommends that you continue on your current medications as directed. Please refer to the Current Medication list given to you today.  If you need a refill on your cardiac medications before your next appointment, please call your pharmacy.   Labwork: Your physician recommends that you return for lab work: Monday, 07/28/16--BMET, CBC w/ Diff, PT-INR, PTT   Testing/Procedures: Your physician has requested that you have a lower extremity arterial exercise duplex. During this test, exercise and ultrasound are used to evaluate arterial blood flow in the legs. Allow one hour for this exam. There are no restrictions or special instructions.  Your physician has requested that you have an ankle brachial index (ABI). During this test an ultrasound and blood pressure cuff are used to evaluate the arteries that supply the arms and legs with blood. Allow thirty minutes for this exam. There are no restrictions or special instructions.  A chest x-ray--Tuesday, 07/29/16-- takes a picture of the organs and structures inside the chest, including the heart, lungs, and blood vessels. This test can show several things, including, whether the heart is enlarges; whether fluid is building up in the lungs; and whether pacemaker / defibrillator leads are still in place.  Follow-Up: Dr. Allyson SabalBerry has ordered a peripheral angiogram on Monday, 08/04/16 with Dr. Allyson SabalBerry (Dx: Claudication) to [facility].  This procedure is going to look at the bloodflow in your lower extremities.  If Dr. Allyson SabalBerry is able to open up the arteries, you will have to spend one night in the hospital.  If he is not able to open the arteries, you will be able to go home that same day.    After the procedure, you will not be allowed to drive for 3 days or push, pull, or lift anything greater than 10 lbs for one week.    You will be required to have the following tests prior to the procedure:  1.  Blood work-the blood work can be done no more than 14 days prior to the procedure.  It [facility] be done at any [facility].  There is one downstairs on the first floor of this building and one in the [facility] building [phone]([address], [address])  2. Chest Xray-the chest xray order has already been placed [facility].     *REPS:  SCOTT  Puncture site: Rt. Groin

## 2016-07-28 ENCOUNTER — Other Ambulatory Visit: Payer: Self-pay | Admitting: Cardiovascular Disease

## 2016-07-28 DIAGNOSIS — I739 Peripheral vascular disease, unspecified: Secondary | ICD-10-CM

## 2016-07-29 ENCOUNTER — Ambulatory Visit
Admission: RE | Admit: 2016-07-29 | Discharge: 2016-07-29 | Disposition: A | Discharge status: Home / Self Care | Payer: Medicare HMO | Source: Ambulatory Visit | Attending: Cardiovascular Disease | Admitting: Cardiovascular Disease

## 2016-07-29 ENCOUNTER — Ambulatory Visit
Admission: RE | Admit: 2016-07-29 | Discharge: 2016-07-29 | Disposition: A | Discharge status: Home / Self Care | Payer: Medicare HMO | Source: Ambulatory Visit | Attending: Diagnostic Neuroimaging | Admitting: Diagnostic Neuroimaging

## 2016-07-29 ENCOUNTER — Telehealth: Payer: Self-pay | Admitting: Cardiovascular Disease

## 2016-07-29 DIAGNOSIS — M79604 Pain in right leg: Secondary | ICD-10-CM

## 2016-07-29 DIAGNOSIS — M5416 Radiculopathy, lumbar region: Secondary | ICD-10-CM | POA: Diagnosis not present

## 2016-07-29 DIAGNOSIS — T1590XA Foreign body on external eye, part unspecified, unspecified eye, initial encounter: Secondary | ICD-10-CM

## 2016-07-29 DIAGNOSIS — I10 Essential (primary) hypertension: Secondary | ICD-10-CM

## 2016-07-29 DIAGNOSIS — G629 Polyneuropathy, unspecified: Secondary | ICD-10-CM

## 2016-07-29 DIAGNOSIS — I739 Peripheral vascular disease, unspecified: Secondary | ICD-10-CM

## 2016-07-29 DIAGNOSIS — M79605 Pain in left leg: Secondary | ICD-10-CM

## 2016-07-29 NOTE — Telephone Encounter (Signed)
CBC with Differential/Platelet  Order: [id number]  Status:  Final result Visible to patient:  No (Not Released) Dx:  Peripheral vascular disease (HCC); Cl...   BASIC METABOLIC PANEL WITH GFR  Order: [id number]  Status:  Final result Visible to patient:  No (Not Released) Dx:  Peripheral vascular disease (HCC); Cl...   APTT  Order: [id number]  Status:  Final result Visible to patient:  No (Not Released) Dx:  Peripheral vascular disease (HCC); Cl...   Protime-INR  Order: [id number]  Status:  Final result Visible to patient:  No (Not Released) Dx:  Peripheral vascular disease (HCC); Cl...  Notes Recorded by Evans Lanceaylor W Heela Heishman on 07/29/2016 at 2:45 PM EDT Left detail message with results, ok per DPR, and to call back if any questions.   ------  Notes Recorded by Runell GessJonathan J Berry, MD on 07/25/2016 at 5:00 PM EST Call and tell normal     DG Chest 2 View  Order: [id number]  Status:  Final result Visible to patient:  No (Not Released) Dx:  Peripheral vascular disease (HCC); Cl...  Notes Recorded by Evans Lanceaylor W Shloimy Michalski on 07/29/2016 at 2:45 PM EDT Left detail message with results, ok per DPR, and to call back if any questions.   ------  Notes Recorded by Runell GessJonathan J Berry, MD on 07/29/2016 at 2:17 PM EDT NAD

## 2016-07-31 ENCOUNTER — Ambulatory Visit (HOSPITAL_COMMUNITY)
Admission: RE | Admit: 2016-07-31 | Discharge: 2016-07-31 | Disposition: A | Discharge status: Home / Self Care | Payer: Medicare HMO | Source: Ambulatory Visit | Attending: Cardiology | Admitting: Cardiology

## 2016-07-31 DIAGNOSIS — I739 Peripheral vascular disease, unspecified: Secondary | ICD-10-CM | POA: Diagnosis not present

## 2016-08-04 ENCOUNTER — Encounter (HOSPITAL_COMMUNITY): Payer: Self-pay | Admitting: Cardiovascular Disease

## 2016-08-04 ENCOUNTER — Encounter (HOSPITAL_COMMUNITY)
Admission: RE | Disposition: A | Discharge status: Home / Self Care | Payer: Self-pay | Source: Ambulatory Visit | Attending: Cardiovascular Disease

## 2016-08-04 ENCOUNTER — Ambulatory Visit (HOSPITAL_COMMUNITY)
Admission: RE | Admit: 2016-08-04 | Discharge: 2016-08-04 | Disposition: A | Discharge status: Home / Self Care | Payer: Medicare HMO | Source: Ambulatory Visit | Attending: Cardiovascular Disease | Admitting: Cardiovascular Disease

## 2016-08-04 ENCOUNTER — Telehealth: Payer: Self-pay | Admitting: Diagnostic Neuroimaging

## 2016-08-04 DIAGNOSIS — Z951 Presence of aortocoronary bypass graft: Secondary | ICD-10-CM | POA: Diagnosis not present

## 2016-08-04 DIAGNOSIS — Z6829 Body mass index (BMI) 29.0-29.9, adult: Secondary | ICD-10-CM | POA: Diagnosis not present

## 2016-08-04 DIAGNOSIS — E663 Overweight: Secondary | ICD-10-CM | POA: Diagnosis not present

## 2016-08-04 DIAGNOSIS — I739 Peripheral vascular disease, unspecified: Secondary | ICD-10-CM | POA: Diagnosis present

## 2016-08-04 DIAGNOSIS — I10 Essential (primary) hypertension: Secondary | ICD-10-CM | POA: Diagnosis not present

## 2016-08-04 DIAGNOSIS — I70213 Atherosclerosis of native arteries of extremities with intermittent claudication, bilateral legs: Secondary | ICD-10-CM | POA: Insufficient documentation

## 2016-08-04 DIAGNOSIS — Z7982 Long term (current) use of aspirin: Secondary | ICD-10-CM | POA: Insufficient documentation

## 2016-08-04 DIAGNOSIS — Z87891 Personal history of nicotine dependence: Secondary | ICD-10-CM | POA: Insufficient documentation

## 2016-08-04 DIAGNOSIS — I2510 Atherosclerotic heart disease of native coronary artery without angina pectoris: Secondary | ICD-10-CM | POA: Insufficient documentation

## 2016-08-04 DIAGNOSIS — E785 Hyperlipidemia, unspecified: Secondary | ICD-10-CM | POA: Insufficient documentation

## 2016-08-04 DIAGNOSIS — I700 Atherosclerosis of aorta: Secondary | ICD-10-CM | POA: Diagnosis not present

## 2016-08-04 HISTORY — PX: LOWER EXTREMITY INTERVENTION: CATH118252

## 2016-08-04 LAB — GLUCOSE, CAPILLARY: Glucose-Capillary: 115 mg/dL — ABNORMAL HIGH (ref 65–99)

## 2016-08-04 SURGERY — LOWER EXTREMITY INTERVENTION

## 2016-08-04 MED ORDER — MIDAZOLAM HCL 2 MG/2ML IJ SOLN
INTRAMUSCULAR | Status: AC
  Filled 2016-08-04: qty 2

## 2016-08-04 MED ORDER — FENTANYL CITRATE (PF) 100 MCG/2ML IJ SOLN
INTRAMUSCULAR | Status: DC | PRN
  Administered 2016-08-04: 25 ug via INTRAVENOUS

## 2016-08-04 MED ORDER — SODIUM CHLORIDE 0.9 % WEIGHT BASED INFUSION
3.0000 mL/kg/h | INTRAVENOUS | Status: DC
  Administered 2016-08-04: 3 mL/kg/h via INTRAVENOUS

## 2016-08-04 MED ORDER — FENTANYL CITRATE (PF) 100 MCG/2ML IJ SOLN
INTRAMUSCULAR | Status: AC
  Filled 2016-08-04: qty 2

## 2016-08-04 MED ORDER — IODIXANOL 320 MG/ML IV SOLN
INTRAVENOUS | Status: DC | PRN
  Administered 2016-08-04: 117 mL via INTRA_ARTERIAL

## 2016-08-04 MED ORDER — ASPIRIN 81 MG PO CHEW
CHEWABLE_TABLET | ORAL | Status: AC
  Administered 2016-08-04: 81 mg via ORAL
  Filled 2016-08-04: qty 1

## 2016-08-04 MED ORDER — ASPIRIN EC 81 MG PO TBEC
81.0000 mg | DELAYED_RELEASE_TABLET | Freq: Every day | ORAL | Status: DC
  Filled 2016-08-04: qty 1

## 2016-08-04 MED ORDER — LIDOCAINE HCL (PF) 1 % IJ SOLN
INTRAMUSCULAR | Status: AC
  Filled 2016-08-04: qty 30

## 2016-08-04 MED ORDER — SODIUM CHLORIDE 0.9 % WEIGHT BASED INFUSION: 1.0000 mL/kg/h | INTRAVENOUS | Status: DC

## 2016-08-04 MED ORDER — ONDANSETRON HCL 4 MG/2ML IJ SOLN: 4.0000 mg | Freq: Four times a day (QID) | INTRAMUSCULAR | Status: DC | PRN

## 2016-08-04 MED ORDER — ACETAMINOPHEN 325 MG PO TABS: 650.0000 mg | ORAL_TABLET | ORAL | Status: DC | PRN

## 2016-08-04 MED ORDER — SODIUM CHLORIDE 0.9% FLUSH: 3.0000 mL | INTRAVENOUS | Status: DC | PRN

## 2016-08-04 MED ORDER — ASPIRIN 81 MG PO CHEW
81.0000 mg | CHEWABLE_TABLET | ORAL | Status: AC
  Administered 2016-08-04: 81 mg via ORAL

## 2016-08-04 MED ORDER — MIDAZOLAM HCL 2 MG/2ML IJ SOLN
INTRAMUSCULAR | Status: DC | PRN
  Administered 2016-08-04: 1 mg via INTRAVENOUS

## 2016-08-04 MED ORDER — MORPHINE SULFATE (PF) 4 MG/ML IV SOLN: 2.0000 mg | INTRAVENOUS | Status: DC | PRN

## 2016-08-04 MED ORDER — HYDRALAZINE HCL 20 MG/ML IJ SOLN
10.0000 mg | INTRAMUSCULAR | Status: DC | PRN
  Administered 2016-08-04: 10 mg via INTRAVENOUS

## 2016-08-04 MED ORDER — HEPARIN (PORCINE) IN NACL 2-0.9 UNIT/ML-% IJ SOLN
INTRAMUSCULAR | Status: DC | PRN
  Administered 2016-08-04: 1000 mL

## 2016-08-04 MED ORDER — LIDOCAINE HCL (PF) 1 % IJ SOLN
INTRAMUSCULAR | Status: DC | PRN
  Administered 2016-08-04: 20 mL

## 2016-08-04 MED ORDER — HYDRALAZINE HCL 20 MG/ML IJ SOLN
INTRAMUSCULAR | Status: AC
  Administered 2016-08-04: 10 mg via INTRAVENOUS
  Filled 2016-08-04: qty 1

## 2016-08-04 MED ORDER — HEPARIN (PORCINE) IN NACL 2-0.9 UNIT/ML-% IJ SOLN
INTRAMUSCULAR | Status: AC
  Filled 2016-08-04: qty 1000

## 2016-08-04 MED ORDER — SODIUM CHLORIDE 0.9 % IV SOLN: INTRAVENOUS | Status: AC

## 2016-08-04 SURGICAL SUPPLY — 9 items
CATH ANGIO 5F PIGTAIL 65CM (CATHETERS) ×1 IMPLANT
KIT PV (KITS) ×2 IMPLANT
SHEATH PINNACLE 5F 10CM (SHEATH) ×1 IMPLANT
STOPCOCK MORSE 400PSI 3WAY (MISCELLANEOUS) ×1 IMPLANT
SYRINGE MEDRAD AVANTA MACH 7 (SYRINGE) ×1 IMPLANT
TRANSDUCER W/STOPCOCK (MISCELLANEOUS) ×2 IMPLANT
TRAY PV CATH (CUSTOM PROCEDURE TRAY) ×2 IMPLANT
TUBING CIL FLEX 10 FLL-RA (TUBING) ×1 IMPLANT
WIRE HITORQ VERSACORE ST 145CM (WIRE) ×1 IMPLANT

## 2016-08-04 NOTE — Interval H&P Note (Signed)
History and Physical Interval Note:  08/04/2016 7:37 AM  [name]  has presented today for surgery, with the diagnosis of claudication  The various methods of treatment have been discussed with the patient and family. After consideration of risks, benefits and other options for treatment, the patient has consented to  Procedure(s): Lower Extremity Intervention (N/A) as a surgical intervention .  The patient's history has been reviewed, patient examined, no change in status, stable for surgery.  I have reviewed the patient's chart and labs.  Questions were answered to the patient's satisfaction.     Nanetta BattyBerry, Royann Wildasin

## 2016-08-04 NOTE — Discharge Instructions (Signed)
Angiogram, Care After °This sheet gives you information about how to care for yourself after your procedure. Your health care provider may also give you more specific instructions. If you have problems or questions, contact your health care provider. °What can I expect after the procedure? °After the procedure, it is common to have bruising and tenderness at the catheter insertion area. °Follow these instructions at home: °Insertion site care  °· Follow instructions from your health care provider about how to take care of your insertion site. Make sure you: °¨ Wash your hands with soap and water before you change your bandage (dressing). If soap and water are not available, use hand sanitizer. °¨ Change your dressing as told by your health care provider. °¨ Leave stitches (sutures), skin glue, or adhesive strips in place. These skin closures may need to stay in place for 2 weeks or longer. If adhesive strip edges start to loosen and curl up, you may trim the loose edges. Do not remove adhesive strips completely unless your health care provider tells you to do that. °· Do not take baths, swim, or use a hot tub until your health care provider approves. °· You may shower 24-48 hours after the procedure or as told by your health care provider. °¨ Gently wash the site with plain soap and water. °¨ Pat the area dry with a clean towel. °¨ Do not rub the site. This may cause bleeding. °· Do not apply powder or lotion to the site. Keep the site clean and dry. °· Check your insertion site every day for signs of infection. Check for: °¨ Redness, swelling, or pain. °¨ Fluid or blood. °¨ Warmth. °¨ Pus or a bad smell. °Activity  °· Rest as told by your health care provider, usually for 1-2 days. °· Do not lift anything that is heavier than 10 lbs. (4.5 kg) or as told by your health care provider. °· Do not drive for 24 hours if you were given a medicine to help you relax (sedative). °· Do not drive or use heavy machinery while  taking prescription pain medicine. °General instructions  °· Return to your normal activities as told by your health care provider, usually in about a week. Ask your health care provider what activities are safe for you. °· If the catheter site starts bleeding, lie flat and put pressure on the site. If the bleeding does not stop, get help right away. This is a medical emergency. °· Drink enough fluid to keep your urine clear or pale yellow. This helps flush the contrast dye from your body. °· Take over-the-counter and prescription medicines only as told by your health care provider. °· Keep all follow-up visits as told by your health care provider. This is important. °Contact a health care provider if: °· You have a fever or chills. °· You have redness, swelling, or pain around your insertion site. °· You have fluid or blood coming from your insertion site. °· The insertion site feels warm to the touch. °· You have pus or a bad smell coming from your insertion site. °· You have bruising around the insertion site. °· You notice blood collecting in the tissue around the catheter site (hematoma). The hematoma may be painful to the touch. °Get help right away if: °· You have severe pain at the catheter insertion area. °· The catheter insertion area swells very fast. °· The catheter insertion area is bleeding, and the bleeding does not stop when you hold steady pressure on   the area. °· The area near or just beyond the catheter insertion site becomes pale, cool, tingly, or numb. °These symptoms may represent a serious problem that is an emergency. Do not wait to see if the symptoms will go away. Get medical help right away. Call your local emergency services (911 in the [place] Do not drive yourself to the hospital. °Summary °· After the procedure, it is common to have bruising and tenderness at the catheter insertion area. °· After the procedure, it is important to rest and drink plenty of fluids. °· Do not take baths,  swim, or use a hot tub until your health care provider says it is okay to do so. You may shower 24-48 hours after the procedure or as told by your health care provider. °· If the catheter site starts bleeding, lie flat and put pressure on the site. If the bleeding does not stop, get help right away. This is a medical emergency. °This information is not intended to replace advice given to you by your health care provider. Make sure you discuss any questions you have with your health care provider. °Document Released: 11/21/2004 Document Revised: 04/09/2016 Document Reviewed: 04/09/2016 °Elsevier Interactive Patient Education © 2017 [employer] ° °

## 2016-08-04 NOTE — Progress Notes (Signed)
Site area: Right groin a 5 french arterial sheath was removed   Site Prior to Removal:  Level 0  Pressure Applied For 15 MINUTES    Bedrest Beginning at 0920a  Manual:   Yes.    Patient Status During Pull:  stable  Post Pull Groin Site:  Level 0  Post Pull Instructions Given:  Yes.    Post Pull Pulses Present:  Yes.    Dressing Applied:  Yes.    Comments:  VS remain stable during sheath pull.

## 2016-08-04 NOTE — H&P (View-Only) (Signed)
07/25/2016 [name]   [birth year]  [id number]  Primary Physician [name], MD Primary Cardiologist: [name] MD Roseanne Reno    HPI:  [name] is a 76 year old mildly overweight married Caucasian male who is accompanied by his wife [name]. I last saw him in the office 10/01/12.Marland Kitchen He is the father of one child, grandfather and one grandchild. 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 [name] December 27, 2004, for left main 3-vessel disease. He had a LIMA to his LAD, a vein to his circumflex marginal branch as well as to the PDA. His other problems include hypertension and hyperlipidemia. He denies chest pain or shortness of breath. He does have left ABI in the 0.85range with a high-frequency signal in his left SFA by duplex ultrasound 10/20/12 but did not claudication at that time. His last Myoview performed 06/27/11 was nonischemic. 4 years ago he developed lifestyle limiting bilateral lower extremity claudication. Should be noted that he did stop smoking at the time of his bypass surgery in 2006.  Current Outpatient Prescriptions  Medication Sig Dispense Refill  . aspirin 81 MG tablet Take 81 mg by mouth daily.    . metoprolol (LOPRESSOR) 50 MG tablet Take 50 mg by mouth at bedtime.    . Omega-3 Fatty Acids (FISH OIL) 1200 MG CAPS Take 1,200 capsules by mouth every morning.    . Testosterone (AXIRON) 30 MG/ACT SOLN Place 30 mg onto the skin 2 (two) times daily.    . Vitamin D, Ergocalciferol, (DRISDOL) 50000 UNITS CAPS capsule Take 50,000 Units by mouth every 7 (seven) days.     No current facility-administered medications for this visit.     Allergies  Allergen Reactions  . Crestor [Rosuvastatin] Other (See Comments)    Myalgias  . Lisinopril     cough    Social History   Social History  . Marital status: Married    Spouse name: cathy  . Number of children: 1  . Years of education: 66   Occupational History  . retired     Social History Main Topics  . Smoking status: Former Smoker    Packs/day: 1.00    Years: 50.00    Types: Cigarettes    Quit date: 12/17/2004  . Smokeless tobacco: Never Used  . Alcohol use Yes     Comment: occasionally  . Drug use: No  . Sexual activity: Not on file   Other Topics Concern  . Not on file   Social History Narrative   Lives with wife   Caffeine - coffee, 1 cup daily     Review of Systems: General: negative for chills, fever, night sweats or weight changes.  Cardiovascular: negative for chest pain, dyspnea on exertion, edema, orthopnea, palpitations, paroxysmal nocturnal dyspnea or shortness of breath Dermatological: negative for rash Respiratory: negative for cough or wheezing Urologic: negative for hematuria Abdominal: negative for nausea, vomiting, diarrhea, bright red blood per rectum, melena, or hematemesis Neurologic: negative for visual changes, syncope, or dizziness All other systems reviewed and are otherwise negative except as noted above.    Blood pressure (!) 144/76, pulse 66, height 6\' 1"  (1.854 m), weight 221 lb 3.2 oz (100.3 kg).  General appearance: alert and no distress Neck: no adenopathy, no carotid bruit, no JVD, supple, symmetrical, trachea midline and thyroid not enlarged, symmetric, no tenderness/mass/nodules Lungs: clear to auscultation bilaterally Heart: regular rate and rhythm, S1, S2 normal, no murmur, click,  rub or gallop Extremities: extremities normal, atraumatic, no cyanosis or edema  EKG sinus rhythm at 66 without ST or T-wave changes. I personally reviewed this EKG.  ASSESSMENT AND PLAN:   CAD in native artery Coronary artery disease status post cardiac catheterization performed by myself 12/26/04 revealing left main/three-vessel disease with preserved left ventricular function. He had coronary artery bypass grafting by Dr.Cub Cornelius Moraswen  12/27/04 with a LIMA to his LAD, a vein to the circumflex marginal branch as well as to his PDA.  Since I saw him in the office 4 years ago he has remained asymptomatic.  HTN (hypertension) History of hypertension blood pressure measured 144/76. He is on metoprolol. Continue current meds at current dosing  Hyperlipidemia History of hyperlipidemia not on statin therapy followed by his PCP  Peripheral vascular disease History of peripheral arterial disease with Doppler performed 10/20/12 revealing a right ABI 1.1 left ABI 0.85. He did have moderately ill with gated velocities at the origin of his left SFA. Since I saw him 4 years ago the claudication has gotten progressively worse. It is symmetric. He has  lifestyle limiting claudication at  less than one block. We will get lower extremity arterial Doppler studies. This reveals progression of disease we have decided to proceed with angiography and potential endovascular therapy.      Runell GessJonathan J. Devere Brem MD FACP,FACC,FAHA, [facility] 07/25/2016 8:49 AM

## 2016-08-04 NOTE — Telephone Encounter (Signed)
I called patient with MRI results. 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   I gave options for referral to spine surgeon for surgical treatment. Patient would like to discuss this first with his PCP and think about his options.    Suanne MarkerVIKRAM R. Ranald Alessio, MD 08/04/2016, 12:54 PM Certified in Neurology, Neurophysiology and Neuroimaging  [facility] [address], [address] [place], [place] [address] [phone]

## 2016-08-14 ENCOUNTER — Telehealth: Payer: Self-pay | Admitting: *Deleted

## 2016-08-14 NOTE — Telephone Encounter (Signed)
LVM informing patient his PCP, Dr Clarene DukeLittle sent referral to see Dr Marjory LiesPenumalli sooner than his routine FU on 09/02/16 due to MRI. Advised patient that Dr Marjory LiesPenumalli spoke with him re: MRI results, and Dr Marjory LiesPenumalli recommended referral to neurosurgeon. Patient was going to discuss with PCP. This RN stated that Dr Marjory LiesPenumalli is not a neurosurgeon, requested he call back to discuss.

## 2016-08-18 ENCOUNTER — Encounter: Payer: Self-pay | Admitting: *Deleted

## 2016-08-18 NOTE — Telephone Encounter (Signed)
Spoke with patient re: needs to see neurosurgeon. He stated he now has blood clots and is seeing cardiologist this month. He stated he wants to see D Penumalli after seeing cardiologist, wants to hold off on neurosurgeon referral at this time. Rescheduled him and advised if he needs to cancel he call more than 24 hrs ahead to avoid cancellation fee. Patient verbalized understanding, appreciation.

## 2016-08-21 ENCOUNTER — Telehealth: Payer: Self-pay | Admitting: *Deleted

## 2016-08-21 NOTE — Telephone Encounter (Signed)
Per Dr Marjory Lies, called patient's PCP, Dr Fredirick Maudlin office and spoke with Karen Kitchens, referral coordinator. Karen Kitchens stated there is not a nurse in the office. She took message re: this RN spoke to patient last week about Dr Marjory Lies recommending he see neurosurgery. Advised her that patient stated he is currently having a problem with blood clots in his legs. Patient did not want a neurosurgury consult at this time.  Patient did request to keep his FU with Dr Marjory Lies, but for it to be rescheduled for after cardiologist's appointment. He stated he would call back and cancel if he decided he didn't want to see Dr Marjory Lies. This RN explained to patient that Dr Marjory Lies recommends neurosurgery so a follow up with him at this time was not necessary.  Patient still requested to keep FU for now; rescheduled Dr [facility]  following his cardiology appt on 09/12/16.    Karen Kitchens stated she would go ahead and put referral to neurosurgery in for patient so that he can be evaluated. She verbalized understanding of call. This RN advised she call back for any questions.

## 2016-09-02 ENCOUNTER — Ambulatory Visit: Payer: Medicare HMO | Admitting: Diagnostic Neuroimaging

## 2016-09-12 ENCOUNTER — Encounter: Payer: Self-pay | Admitting: Cardiovascular Disease

## 2016-09-12 ENCOUNTER — Ambulatory Visit (INDEPENDENT_AMBULATORY_CARE_PROVIDER_SITE_OTHER): Payer: Medicare HMO | Admitting: Cardiovascular Disease

## 2016-09-12 VITALS — BP 141/85 | HR 93 | Ht 73.0 in | Wt 215.6 lb

## 2016-09-12 DIAGNOSIS — I10 Essential (primary) hypertension: Secondary | ICD-10-CM

## 2016-09-12 DIAGNOSIS — I739 Peripheral vascular disease, unspecified: Secondary | ICD-10-CM

## 2016-09-12 DIAGNOSIS — E7800 Pure hypercholesterolemia, unspecified: Secondary | ICD-10-CM

## 2016-09-12 MED ORDER — CILOSTAZOL 50 MG PO TABS: 50.0000 mg | ORAL_TABLET | Freq: Two times a day (BID) | ORAL | 6 refills | Status: DC

## 2016-09-12 NOTE — Progress Notes (Signed)
09/12/2016 [name]   [birth year]  [id number]  Primary Physician Richard Puffer, MD Primary Cardiologist: Richard Gess MD [name]  HPI:  [name] is a 76 year old mildly overweight married Caucasian male whoI last saw him in the office 07/25/16. He is the father of one child, grandfather and one grandchild. 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 Ashley Lloyd December 27, 2004, for left main 3-vessel disease. He had a LIMA to his LAD, a vein to his circumflex marginal branch as well as to the PDA. His other problems include hypertension and hyperlipidemia. He denies chest pain or shortness of breath. He does have left ABI in the 0.85range with a high-frequency signal in his left SFA by duplex ultrasound 10/20/12 but did not claudication at that time.His last Myoview performed 06/27/11 was nonischemic.4 years ago he developed lifestyle limiting bilateral lower extremity claudication. Should be noted that he did stop smoking at the time of his bypass surgery in 2006. He underwent peripheral angiography by myself 08/04/16 revealing a long segment calcified occlusion right SFA and high-grade diffuse calcified left SFA stenosis. I did not think that he could be percutaneously revascularized and therefore opted for medical therapy.  Current Outpatient Prescriptions  Medication Sig Dispense Refill  . aspirin 81 MG tablet Take 81 mg by mouth at bedtime.     . metoprolol (LOPRESSOR) 50 MG tablet Take 50 mg by mouth at bedtime.    . naproxen sodium (ANAPROX) 220 MG tablet Take 440 mg by mouth at bedtime.    . Omega-3 Fatty Acids (FISH OIL) 1200 MG CAPS Take 2,400 capsules by mouth every morning.     . Vitamin D, Ergocalciferol, (DRISDOL) 50000 UNITS CAPS capsule Take 50,000 Units by mouth every Sunday.      No current facility-administered medications for this visit.     Allergies  Allergen Reactions  . Crestor [Rosuvastatin] Other (See Comments)   Myalgias  . Lisinopril     cough    Social History   Social History  . Marital status: Married    Spouse name: cathy  . Number of children: 1  . Years of education: 67   Occupational History  . retired    Social History Main Topics  . Smoking status: Former Smoker    Packs/day: 1.00    Years: 50.00    Types: Cigarettes    Quit date: 12/17/2004  . Smokeless tobacco: Never Used  . Alcohol use Yes     Comment: occasionally  . Drug use: No  . Sexual activity: Not on file   Other Topics Concern  . Not on file   Social History Narrative   Lives with wife   Caffeine - coffee, 1 cup daily     Review of Systems: General: negative for chills, fever, night sweats or weight changes.  Cardiovascular: negative for chest pain, dyspnea on exertion, edema, orthopnea, palpitations, paroxysmal nocturnal dyspnea or shortness of breath Dermatological: negative for rash Respiratory: negative for cough or wheezing Urologic: negative for hematuria Abdominal: negative for nausea, vomiting, diarrhea, bright red blood per rectum, melena, or hematemesis Neurologic: negative for visual changes, syncope, or dizziness All other systems reviewed and are otherwise negative except as noted above.    Blood pressure (!) 141/85, pulse 93, height  (1.854 m), weight 215 lb 9.6 oz (97.8 kg), SpO2 92 %.  General appearance: alert and no distress Neck: no adenopathy, no carotid bruit, no JVD,  supple, symmetrical, trachea midline and thyroid not enlarged, symmetric, no tenderness/mass/nodules Lungs: clear to auscultation bilaterally Heart: regular rate and rhythm, S1, S2 normal, no murmur, click, rub or gallop Extremities: extremities normal, atraumatic, no cyanosis or edema  EKG not performed today  ASSESSMENT AND PLAN:   Peripheral vascular disease [facility]) [name] returns today for follow-up of his recent angiogram which I performed 08/04/16 for claudication. He had Dopplers in our office  performed/15/18 revealing a right ABI could not be Because of Noncompressibility and Left ABI of 0.63. He Had a Occluded Right SFA from the Origin down to the Adductor Canal Which Was Highly Calcified and with Two-Vessel Runoff and No Left SFA That Was Diffusely Calcified 80-95% Diffuse Calcified Stenosis. I did not think that his right SFA could be percutaneously revascularized, and therefore we opted for medical therapy. I'm going to put him on Pletal 50 mg by mouth twice a day.  HTN (hypertension) History of hypertension blood pressure measured today 141/85. He is on metoprolol. Continue current meds at current dosing  Hyperlipidemia History of hyperlipidemia intolerant to statin therapy. We will recheck a lipid and liver profile.      Richard Gess MD FACP,FACC,FAHA, [facility] 09/12/2016 4:22 PM

## 2016-09-12 NOTE — Assessment & Plan Note (Signed)
[name] returns today for follow-up of his recent angiogram which I performed 08/04/16 for claudication. He had Dopplers in our office performed/15/18 revealing a right ABI could not be Because of Noncompressibility and Left ABI of 0.63. He Had a Occluded Right SFA from the Origin down to the Adductor Canal Which Was Highly Calcified and with Two-Vessel Runoff and No Left SFA That Was Diffusely Calcified 80-95% Diffuse Calcified Stenosis. I did not think that his right SFA could be percutaneously revascularized, and therefore we opted for medical therapy. I'm going to put him on Pletal 50 mg by mouth twice a day.

## 2016-09-12 NOTE — Assessment & Plan Note (Signed)
History of hypertension blood pressure measured today 141/85. He is on metoprolol. Continue current meds at current dosing

## 2016-09-12 NOTE — Addendum Note (Signed)
Addended by: Evans Lance on: 09/12/2016 04:34 PM   Modules accepted: Orders

## 2016-09-12 NOTE — Assessment & Plan Note (Signed)
History of hyperlipidemia intolerant to statin therapy. We will recheck a lipid and liver profile 

## 2016-09-12 NOTE — Patient Instructions (Signed)
Medication Instructions: START Pletal 50 mg twice daily.   Labwork: Your physician recommends that you return for a FASTING lipid profile and hepatic function panel.   Follow-Up: Your physician wants you to follow-up in: 6 months with Dr. Allyson Sabal. You will receive a reminder letter in the mail two months in advance. If you don't receive a letter, please call our office to schedule the follow-up appointment.  If you need a refill on your cardiac medications before your next appointment, please call your pharmacy.

## 2016-09-16 ENCOUNTER — Ambulatory Visit: Payer: Self-pay | Admitting: Diagnostic Neuroimaging

## 2016-09-17 LAB — LIPID PANEL
CHOLESTEROL: 163 mg/dL (ref <200)
HDL: 38 mg/dL — ABNORMAL LOW (ref >40)
LDL Cholesterol: 110 mg/dL — ABNORMAL HIGH (ref <100)
Total CHOL/HDL Ratio: 4.3 Ratio (ref <5.0)
Triglycerides: 76 mg/dL (ref <150)
VLDL: 15 mg/dL (ref <30)

## 2016-09-17 LAB — HEPATIC FUNCTION PANEL
ALT: 12 U/L (ref 9–46)
AST: 13 U/L (ref 10–35)
Albumin: 4 g/dL (ref 3.6–5.1)
Alkaline Phosphatase: 73 U/L (ref 40–115)
BILIRUBIN DIRECT: 0.2 mg/dL (ref <=0.2)
BILIRUBIN INDIRECT: 0.7 mg/dL (ref 0.2–1.2)
Total Bilirubin: 0.9 mg/dL (ref 0.2–1.2)
Total Protein: 6.6 g/dL (ref 6.1–8.1)

## 2016-09-19 ENCOUNTER — Other Ambulatory Visit: Payer: Self-pay | Admitting: Cardiovascular Disease

## 2016-09-19 NOTE — Telephone Encounter (Signed)
New Message   pt verbalized that he is returning call for rn Ladona Ridgelaylor  About sending a prescription into pharmacy

## 2016-09-23 MED ORDER — PRAVASTATIN SODIUM 20 MG PO TABS: 20.0000 mg | ORAL_TABLET | Freq: Every evening | ORAL | 3 refills | Status: DC

## 2016-09-23 NOTE — Telephone Encounter (Signed)
Wife is returning Taylor's call ,she needs the name of pt's pharmacy. [facility] pharmacy is [facility] in  [place]-[phone].

## 2016-09-23 NOTE — Telephone Encounter (Signed)
Rx(s) sent to pharmacy electronically per result note copied below   Notes recorded by Runell GessJonathan J Berry, MD on 09/17/2016 at 9:28 AM EDT History of CAD goal for secondary prevention. Intolerant to Crestor. Start Pravachol 20 mg a day and recheck lipid and liver in 2 months.

## 2016-10-22 ENCOUNTER — Ambulatory Visit: Payer: Medicare HMO | Admitting: Podiatry

## 2016-12-28 ENCOUNTER — Other Ambulatory Visit: Payer: Self-pay | Admitting: Cardiovascular Disease

## 2017-01-01 ENCOUNTER — Other Ambulatory Visit: Payer: Self-pay | Admitting: Cardiovascular Disease

## 2017-01-15 ENCOUNTER — Ambulatory Visit (INDEPENDENT_AMBULATORY_CARE_PROVIDER_SITE_OTHER): Payer: Medicare HMO | Admitting: Cardiology

## 2017-01-15 ENCOUNTER — Encounter: Payer: Self-pay | Admitting: Cardiology

## 2017-01-15 VITALS — BP 110/70 | HR 70 | Ht 73.0 in | Wt 215.4 lb

## 2017-01-15 DIAGNOSIS — I739 Peripheral vascular disease, unspecified: Secondary | ICD-10-CM | POA: Diagnosis not present

## 2017-01-15 DIAGNOSIS — I10 Essential (primary) hypertension: Secondary | ICD-10-CM

## 2017-01-15 DIAGNOSIS — E785 Hyperlipidemia, unspecified: Secondary | ICD-10-CM | POA: Diagnosis not present

## 2017-01-15 DIAGNOSIS — Z951 Presence of aortocoronary bypass graft: Secondary | ICD-10-CM

## 2017-01-15 DIAGNOSIS — M48061 Spinal stenosis, lumbar region without neurogenic claudication: Secondary | ICD-10-CM

## 2017-01-15 MED ORDER — CILOSTAZOL 100 MG PO TABS: 50.0000 mg | ORAL_TABLET | Freq: Two times a day (BID) | ORAL | Status: DC

## 2017-01-15 MED ORDER — CILOSTAZOL 100 MG PO TABS: 100.0000 mg | ORAL_TABLET | Freq: Two times a day (BID) | ORAL | 3 refills | Status: DC

## 2017-01-15 MED ORDER — CILOSTAZOL 100 MG PO TABS: 100.0000 mg | ORAL_TABLET | Freq: Two times a day (BID) | ORAL | Status: DC

## 2017-01-15 NOTE — Assessment & Plan Note (Signed)
CABG x 3 2006-LIMA-LAD, SVG-OM, SVG-PDA Myoview low risk 2013 No angina

## 2017-01-15 NOTE — Patient Instructions (Signed)
Medication Instructions: INCREASE the Pletal to 100 mg tablet twice daily.  If you need a refill on your cardiac medications before your next appointment, please call your pharmacy.    Follow-Up: Your physician wants you to follow-up towards the end of September with Dr. Allyson SabalBerry. You will receive a reminder letter in the mail two months in advance. If you don't receive a letter, please call our office to schedule this follow-up appointment.     Thank you [facility] choosing [facility]!!

## 2017-01-15 NOTE — Assessment & Plan Note (Signed)
Controlled.  

## 2017-01-15 NOTE — Assessment & Plan Note (Signed)
Pt seen for increasing claudication of his LE

## 2017-01-15 NOTE — Assessment & Plan Note (Signed)
MRI March 2018 1. At L4-5: pseudo disc bulging and facet and ligamentum flavum hypertrophy with severe spinal stenosis and moderate biforaminal stenosis. 2. At L2-3: disc bulging and facet hypertrophy with moderate spinal stenosis and mild foraminal stenosis. 3. At L3-4: disc bulging and facet hypertrophy with mild biforaminal stenosis.  This was never worked up as it was felt his symptoms were secondary to vascular disease. He has no rest leg pain

## 2017-01-15 NOTE — Progress Notes (Signed)
01/15/2017 [name]   [birth year]  [id number]  Primary Physician [name], MD Primary Cardiologist: Dr Allyson Sabal  HPI:  76 y/o male followed by Dr Allyson Sabal with a history of CAD, s/p CABG x 3 in 2006. He had a Myoview that was low risk in 2013. He has PVD and had an angiogram march 2018 secondary to claudication. The plan was for medical Rx and Pletal 50 mg BID was added.   The pt is in the office today for follow up. He is having increasing claudication. Yesterday he was walking his grandson to school and could hardly make it to the school from the parking lot. He has no rest symptoms.    Media Information        Document Information   CARDIOVASCULAR    08/05/2016 13:00  Attached To:  Hospital Encounter on 08/04/16  Source Information   Default, Provider, MD       Current Outpatient Prescriptions  Medication Sig Dispense Refill  . aspirin 81 MG tablet Take 81 mg by mouth at bedtime.     . cilostazol (PLETAL) 100 MG tablet Take 1 tablet (100 mg total) by mouth 2 (two) times daily. 180 tablet 3  . metoprolol (LOPRESSOR) 50 MG tablet Take 50 mg by mouth at bedtime.    . naproxen sodium (ANAPROX) 220 MG tablet Take 440 mg by mouth at bedtime.    . Omega-3 Fatty Acids (FISH OIL) 1200 MG CAPS Take 2,400 capsules by mouth every morning.     . pravastatin (PRAVACHOL) 20 MG tablet Take 1 tablet (20 mg total) by mouth every evening. 90 tablet 3  . Vitamin D, Ergocalciferol, (DRISDOL) 50000 UNITS CAPS capsule Take 50,000 Units by mouth every Sunday.      No current facility-administered medications for this visit.     Allergies  Allergen Reactions  . Crestor [Rosuvastatin] Other (See Comments)    Myalgias  . Lisinopril     cough    Past Medical History:  Diagnosis Date  . Claudication (HCC) 06/27/2011   Left ABIs abnormal values with mild-moderate arterial insufficiency, right SFA moderate amount of mixed density plaque elevating velocities suggestive 50-69% diameter  reduction  . Coronary artery disease 06/27/2011   Normal Lexiscan, EF 64, post-stress EF 64, no EKG changes  . Diabetes mellitus without complication (HCC)    prediabetic  . Hyperlipidemia   . Hypertension   . Impotence of organic origin   . Osteoarthritis   . Other testicular hypofunction   . PVD (peripheral vascular disease) (HCC)     Social History   Social History  . Marital status: Married    Spouse name: cathy  . Number of children: 1  . Years of education: 12   Occupational History  . retired    Social History Main Topics  . Smoking status: Former Smoker    Packs/day: 1.00    Years: 50.00    Types: Cigarettes    Quit date: 12/17/2004  . Smokeless tobacco: Never Used  . Alcohol use Yes     Comment: occasionally  . Drug use: No  . Sexual activity: Not on file   Other Topics Concern  . Not on file   Social History Narrative   Lives with wife   Caffeine - coffee, 1 cup daily     Family History  Problem Relation Age of Onset  . Hypertension Mother        19 96  . Clotting disorder Father  1969  . Diabetes Brother        heart surgery     Review of Systems: General: negative for chills, fever, night sweats or weight changes.  Cardiovascular: negative for chest pain, dyspnea on exertion, edema, orthopnea, palpitations, paroxysmal nocturnal dyspnea or shortness of breath Dermatological: negative for rash Respiratory: negative for cough or wheezing Urologic: negative for hematuria Abdominal: negative for nausea, vomiting, diarrhea, bright red blood per rectum, melena, or hematemesis Neurologic: negative for visual changes, syncope, or dizziness All other systems reviewed and are otherwise negative except as noted above.    Blood pressure 110/70, pulse 70, height 6\' 1"  (1.854 m), weight 215 lb 6.4 oz (97.7 kg).  General appearance: alert, cooperative and no distress Neck: no carotid bruit and no JVD Lungs: clear to auscultation bilaterally Heart:  regular rate and rhythm Extremities: no edema. No wounds on his feet. His distal pulses are diminnished Skin: Skin color, texture, turgor normal. No rashes or lesions Neurologic: Grossly normal   ASSESSMENT AND PLAN:   Claudication of both lower extremities (HCC) Pt seen for increasing claudication of his LE  Peripheral vascular disease (HCC) Bilateral LEA disease, occluded RSFA, 95% LSFA with 2V runoff by angiogram March 2018   Hx of CABG CABG x 3 2006-LIMA-LAD, SVG-OM, SVG-PDA Myoview low risk 2013 No angina  Dyslipidemia Statin intolerance  HTN (hypertension) Controlled  Spinal stenosis, lumbar MRI March 2018 1. At L4-5: pseudo disc bulging and facet and ligamentum flavum hypertrophy with severe spinal stenosis and moderate biforaminal stenosis. 2. At L2-3: disc bulging and facet hypertrophy with moderate spinal stenosis and mild foraminal stenosis. 3. At L3-4: disc bulging and facet hypertrophy with mild biforaminal stenosis.  This was never worked up as it was felt his symptoms were secondary to vascular disease. He has no rest leg pain   PLAN  The pt states he did have some improvement in his symptoms with the Pletal initially but that he is now as bad or worse than before.   Increase Pletal to 100 mg BID. Will ask dr Allyson SabalBerry to review-? surgical consult.   Corine ShelterLuke Chiquita Heckert PA-C 01/15/2017 9:20 AM

## 2017-01-15 NOTE — Assessment & Plan Note (Signed)
Bilateral LEA disease, occluded RSFA, 95% LSFA with 2V runoff by angiogram March 2018

## 2017-01-15 NOTE — Assessment & Plan Note (Signed)
Statin intolerance 

## 2017-02-18 ENCOUNTER — Ambulatory Visit (INDEPENDENT_AMBULATORY_CARE_PROVIDER_SITE_OTHER): Payer: Medicare HMO | Admitting: Cardiovascular Disease

## 2017-02-18 ENCOUNTER — Encounter: Payer: Self-pay | Admitting: Cardiovascular Disease

## 2017-02-18 ENCOUNTER — Other Ambulatory Visit: Payer: Self-pay | Admitting: Cardiovascular Disease

## 2017-02-18 VITALS — BP 140/80 | HR 70 | Ht 73.0 in | Wt 214.0 lb

## 2017-02-18 DIAGNOSIS — I10 Essential (primary) hypertension: Secondary | ICD-10-CM

## 2017-02-18 DIAGNOSIS — E785 Hyperlipidemia, unspecified: Secondary | ICD-10-CM | POA: Diagnosis not present

## 2017-02-18 DIAGNOSIS — I739 Peripheral vascular disease, unspecified: Secondary | ICD-10-CM

## 2017-02-18 NOTE — Assessment & Plan Note (Signed)
History of essential hypertension blood pressure is 140/80. He is on metoprolol. Continue current meds

## 2017-02-18 NOTE — Assessment & Plan Note (Signed)
History of dyslipidemia on statin therapy followed by his PCP 

## 2017-02-18 NOTE — Assessment & Plan Note (Signed)
History of peripheral arterial disease with lifestyle limiting claudication. Angiography performed by myself 08/04/16 revealed an occluded right SFA at the origin down to the adductor canal which was highly calcified with 2 vessel runoff and diffuse high-grade segmental disease in the left SFA which was highly calcified as well. The patient is on maximum doses of Pletal. He wishes to pursue percutaneous fixation of his left SFA with staged potential consideration of right femoropopliteal bypass grafting.

## 2017-02-18 NOTE — Patient Instructions (Signed)
   [facility] CARDIOVASCULAR DIVISION [facility] [address] [place] [address] Dept: [phone] Loc: [phone]  [name]  02/18/2017  You are scheduled for a Peripheral Angiogram on Monday, November 26 with Dr. Nanetta Batty.  1. Please arrive at the [facility] (Main Entrance A) at [facility]: [address] [place], [place] [address] at 5:30 AM (two hours before your procedure to ensure your preparation). Free valet parking service is available.   Special note: Every effort is made to have your procedure done on time. Please understand that emergencies sometimes delay scheduled procedures.  2. Diet: Do not eat or drink anything after midnight prior to your procedure except sips of water to take medications.  3. Labs: You will need to have blood drawn on Monday, November 19 at [facility] [address], [place]  Open: 8am - 5pm (Lunch 12:30 - 1:30)   Phone: [phone]. You do not need to be fasting.  4. Medication instructions in preparation for your procedure:  On the morning of your procedure, take your Aspirin and any morning medicines NOT listed above.  You may use sips of water.  5. Plan for one night stay--bring personal belongings. 6. Bring a current list of your medications and current insurance cards. 7. You MUST have a responsible person to drive you home. 8. Someone MUST be with you the first 24 hours after you arrive home or your discharge will be delayed. 9. Please wear clothes that are easy to get on and off and wear slip-on shoes.  Thank you for allowing [place] to care for you!   -- [facility]  Post Procedure Testing and Follow-up:  Your physician has requested that you have a lower extremity arterial duplex. During this test, ultrasound is used to evaluate arterial blood flow in the legs. Allow one hour for this exam. There are no  restrictions or special instructions.  Your physician has requested that you have an ankle brachial index (ABI). During this test an ultrasound and blood pressure cuff are used to evaluate the arteries that supply the arms and legs with blood. Allow thirty minutes for this exam. There are no restrictions or special instructions. --approximately 1 week after procedure  Your physician recommends that you schedule a follow-up appointment with Dr. Allyson Sabal approximately 2 weeks after procedure.

## 2017-02-18 NOTE — Progress Notes (Signed)
02/18/2017 [name]   02-26-1941  [id number]  Primary Physician Richard Puffer, MD Primary Cardiologist: Richard Gess MD [name], [place], [place]  HPI:  [name] is a 76 y.o. male mildly overweight married Caucasian male whoI last saw him in the office 07/25/16. He is the father of one child, grandfather and one grandchild. I last saw him in the office 09/12/16. 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 Ashley Lloyd December 27, 2004, for left main 3-vessel disease. He had a LIMA to his LAD, a vein to his circumflex marginal branch as well as to the PDA. His other problems include hypertension and hyperlipidemia. He denies chest pain or shortness of breath. He does have left ABI in the 0.85range with a high-frequency signal in his left SFA by duplex ultrasound 10/20/12 but did not claudication at that time.His last Myoview performed 06/27/11 was nonischemic.4 years ago he developed lifestyle limiting bilateral lower extremity claudication. Should be noted that he did stop smoking at the time of his bypass surgery in 2006. He underwent peripheral angiography by myself 08/04/16 revealing a long segment calcified occlusion right SFA and high-grade diffuse calcified left SFA stenosis. I did not think that he could be percutaneously revascularized on the right, and therefore opted for medical therapy. This Pletal has been titrated to 100 mg by mouth twice a day without significant improvement in his claudication symptoms. These are lifestyle limiting. He denies chest pain or shortness of breath.   Current Meds  Medication Sig  . aspirin 81 MG tablet Take 81 mg by mouth at bedtime.   . cilostazol (PLETAL) 100 MG tablet Take 1 tablet (100 mg total) by mouth 2 (two) times daily.  . metoprolol (LOPRESSOR) 50 MG tablet Take 50 mg by mouth at bedtime.  . naproxen sodium (ANAPROX) 220 MG tablet Take 440 mg by mouth at bedtime.  . Omega-3 Fatty Acids (FISH OIL) 1200 MG CAPS Take  2,400 capsules by mouth every morning.   . Vitamin D, Ergocalciferol, (DRISDOL) 50000 UNITS CAPS capsule Take 50,000 Units by mouth every Sunday.      Allergies  Allergen Reactions  . Crestor [Rosuvastatin] Other (See Comments)    Myalgias  . Lisinopril     cough    Social History   Social History  . Marital status: Married    Spouse name: [name]  . Number of children: 1  . Years of education: 9   Occupational History  . retired    Social History Main Topics  . Smoking status: Former Smoker    Packs/day: 1.00    Years: 50.00    Types: Cigarettes    Quit date: 12/17/2004  . Smokeless tobacco: Never Used  . Alcohol use Yes     Comment: occasionally  . Drug use: No  . Sexual activity: Not on file   Other Topics Concern  . Not on file   Social History Narrative   Lives with wife   Caffeine - coffee, 1 cup daily     Review of Systems: General: negative for chills, fever, night sweats or weight changes.  Cardiovascular: negative for chest pain, dyspnea on exertion, edema, orthopnea, palpitations, paroxysmal nocturnal dyspnea or shortness of breath Dermatological: negative for rash Respiratory: negative for cough or wheezing Urologic: negative for hematuria Abdominal: negative for nausea, vomiting, diarrhea, bright red blood per rectum, melena, or hematemesis Neurologic: negative for visual changes, syncope, or dizziness All other systems reviewed and  are otherwise negative except as noted above.    Blood pressure 140/80, pulse 70, height  (1.854 m), weight 214 lb (97.1 kg).  General appearance: alert and no distress Neck: no adenopathy, no carotid bruit, no JVD, supple, symmetrical, trachea midline and thyroid not enlarged, symmetric, no tenderness/mass/nodules Lungs: clear to auscultation bilaterally Heart: regular rate and rhythm, S1, S2 normal, no murmur, click, rub or gallop Extremities: extremities normal, atraumatic, no cyanosis or edema Pulses: 2+ and  symmetric Diminished pedal pulses bilaterally Skin: Skin color, texture, turgor normal. No rashes or lesions Neurologic: Alert and oriented X 3, normal strength and tone. Normal symmetric reflexes. Normal coordination and gait  EKG not performed today  ASSESSMENT AND PLAN:   Hx of CABG History of coronary artery bypass grafting 3 in 2006 the LIMA to his LAD, vein to obtuse marginal branch and PDA. His last Myoview performed in 2013 was low risk. He denies chest pain or shortness of breath.  HTN (hypertension) History of essential hypertension blood pressure is 140/80. He is on metoprolol. Continue current meds  Dyslipidemia History of dyslipidemia on statin therapy followed by his PCP  Peripheral vascular disease (HCC) History of peripheral arterial disease with lifestyle limiting claudication. Angiography performed by myself 08/04/16 revealed an occluded right SFA at the origin down to the adductor canal which was highly calcified with 2 vessel runoff and diffuse high-grade segmental disease in the left SFA which was highly calcified as well. The patient is on maximum doses of Pletal. He wishes to pursue percutaneous fixation of his left SFA with staged potential consideration of right femoropopliteal bypass grafting.      Richard Gess MD FACP,FACC,FAHA, [facility] 02/18/2017 9:34 AM

## 2017-02-18 NOTE — Assessment & Plan Note (Signed)
History of coronary artery bypass grafting 3 in 2006 the LIMA to his LAD, vein to obtuse marginal branch and PDA. His last Myoview performed in 2013 was low risk. He denies chest pain or shortness of breath.

## 2017-04-06 ENCOUNTER — Other Ambulatory Visit: Payer: Self-pay | Admitting: Cardiovascular Disease

## 2017-04-06 DIAGNOSIS — I739 Peripheral vascular disease, unspecified: Secondary | ICD-10-CM

## 2017-04-07 ENCOUNTER — Telehealth: Payer: Self-pay | Admitting: Cardiovascular Disease

## 2017-04-07 NOTE — Telephone Encounter (Signed)
Spoke to patient- patient wanted make sure he was prepared for procedure on 05/13/17  patient did not have labs done yesterday , will come tomorrow. Patient aware to arrive at hospital at 8:30 am . Per TAYLOR INSTRUCTION TIME CHANGE WITH PROCEDURE.  PATIENT VERBALIZED UNDERSTANDING.

## 2017-04-07 NOTE — Telephone Encounter (Signed)
New message    Patient would like to go over details of his procedure for Monday please call thank you

## 2017-04-09 LAB — CBC WITH DIFFERENTIAL/PLATELET
BASOS: 0 %
Basophils Absolute: 0 10*3/uL (ref 0.0–0.2)
EOS (ABSOLUTE): 0.3 10*3/uL (ref 0.0–0.4)
EOS: 3 %
HEMATOCRIT: 41.4 % (ref 37.5–51.0)
Hemoglobin: 14.1 g/dL (ref 13.0–17.7)
IMMATURE GRANULOCYTES: 0 %
Immature Grans (Abs): 0 10*3/uL (ref 0.0–0.1)
LYMPHS ABS: 2.2 10*3/uL (ref 0.7–3.1)
Lymphs: 27 %
MCH: 30 pg (ref 26.6–33.0)
MCHC: 34.1 g/dL (ref 31.5–35.7)
MCV: 88 fL (ref 79–97)
MONOCYTES: 6 %
MONOS ABS: 0.5 10*3/uL (ref 0.1–0.9)
Neutrophils Absolute: 5.3 10*3/uL (ref 1.4–7.0)
Neutrophils: 64 %
PLATELETS: 266 10*3/uL (ref 150–379)
RBC: 4.7 x10E6/uL (ref 4.14–5.80)
RDW: 14.2 % (ref 12.3–15.4)
WBC: 8.4 10*3/uL (ref 3.4–10.8)

## 2017-04-09 LAB — BASIC METABOLIC PANEL
BUN / CREAT RATIO: 25 — AB (ref 10–24)
BUN: 21 mg/dL (ref 8–27)
CO2: 23 mmol/L (ref 20–29)
CREATININE: 0.83 mg/dL (ref 0.76–1.27)
Calcium: 9.4 mg/dL (ref 8.6–10.2)
Chloride: 104 mmol/L (ref 96–106)
GFR calc Af Amer: 100 mL/min/{1.73_m2} (ref >59)
GFR, EST NON AFRICAN AMERICAN: 87 mL/min/{1.73_m2} (ref >59)
GLUCOSE: 121 mg/dL — AB (ref 65–99)
Potassium: 4.7 mmol/L (ref 3.5–5.2)
SODIUM: 142 mmol/L (ref 134–144)

## 2017-04-09 LAB — PROTIME-INR
INR: 1.1 (ref 0.8–1.2)
PROTHROMBIN TIME: 11 s (ref 9.1–12.0)

## 2017-04-09 LAB — APTT: APTT: 30 s (ref 24–33)

## 2017-04-13 ENCOUNTER — Other Ambulatory Visit: Payer: Self-pay

## 2017-04-13 ENCOUNTER — Encounter (HOSPITAL_COMMUNITY)
Admission: RE | Disposition: A | Discharge status: Home / Self Care | Payer: Self-pay | Source: Ambulatory Visit | Attending: Cardiovascular Disease

## 2017-04-13 ENCOUNTER — Ambulatory Visit (HOSPITAL_COMMUNITY)
Admission: RE | Admit: 2017-04-13 | Discharge: 2017-04-14 | Disposition: A | Discharge status: Home / Self Care | Payer: Medicare HMO | Source: Ambulatory Visit | Attending: Cardiovascular Disease | Admitting: Cardiovascular Disease

## 2017-04-13 ENCOUNTER — Encounter (HOSPITAL_COMMUNITY): Payer: Self-pay | Admitting: Cardiovascular Disease

## 2017-04-13 DIAGNOSIS — I70212 Atherosclerosis of native arteries of extremities with intermittent claudication, left leg: Secondary | ICD-10-CM | POA: Diagnosis not present

## 2017-04-13 DIAGNOSIS — I2510 Atherosclerotic heart disease of native coronary artery without angina pectoris: Secondary | ICD-10-CM | POA: Insufficient documentation

## 2017-04-13 DIAGNOSIS — Z951 Presence of aortocoronary bypass graft: Secondary | ICD-10-CM | POA: Insufficient documentation

## 2017-04-13 DIAGNOSIS — I739 Peripheral vascular disease, unspecified: Secondary | ICD-10-CM | POA: Diagnosis present

## 2017-04-13 DIAGNOSIS — Z7982 Long term (current) use of aspirin: Secondary | ICD-10-CM | POA: Insufficient documentation

## 2017-04-13 DIAGNOSIS — Z87891 Personal history of nicotine dependence: Secondary | ICD-10-CM | POA: Insufficient documentation

## 2017-04-13 DIAGNOSIS — I10 Essential (primary) hypertension: Secondary | ICD-10-CM | POA: Diagnosis not present

## 2017-04-13 DIAGNOSIS — E785 Hyperlipidemia, unspecified: Secondary | ICD-10-CM | POA: Insufficient documentation

## 2017-04-13 DIAGNOSIS — E663 Overweight: Secondary | ICD-10-CM | POA: Insufficient documentation

## 2017-04-13 DIAGNOSIS — Z6828 Body mass index (BMI) 28.0-28.9, adult: Secondary | ICD-10-CM | POA: Diagnosis not present

## 2017-04-13 HISTORY — PX: LOWER EXTREMITY ANGIOGRAPHY: CATH118251

## 2017-04-13 HISTORY — PX: PERIPHERAL VASCULAR BALLOON ANGIOPLASTY: CATH118281

## 2017-04-13 HISTORY — PX: PERIPHERAL VASCULAR ATHERECTOMY: CATH118256

## 2017-04-13 LAB — POCT ACTIVATED CLOTTING TIME
ACTIVATED CLOTTING TIME: 213 s
Activated Clotting Time: 230 seconds
Activated Clotting Time: 235 seconds
Activated Clotting Time: 208 seconds
Activated Clotting Time: 252 seconds

## 2017-04-13 SURGERY — LOWER EXTREMITY ANGIOGRAPHY
Anesthesia: LOCAL

## 2017-04-13 MED ORDER — SODIUM CHLORIDE 0.9% FLUSH: 3.0000 mL | INTRAVENOUS | Status: DC | PRN

## 2017-04-13 MED ORDER — CLOPIDOGREL BISULFATE 75 MG PO TABS: 300.0000 mg | ORAL_TABLET | Freq: Once | ORAL | Status: AC

## 2017-04-13 MED ORDER — VERAPAMIL HCL 2.5 MG/ML IV SOLN
INTRAVENOUS | Status: AC
  Filled 2017-04-13: qty 2

## 2017-04-13 MED ORDER — SODIUM CHLORIDE 0.9 % WEIGHT BASED INFUSION
3.0000 mL/kg/h | INTRAVENOUS | Status: DC
  Administered 2017-04-13 (×2): 3 mL/kg/h via INTRAVENOUS

## 2017-04-13 MED ORDER — NITROGLYCERIN IN D5W 200-5 MCG/ML-% IV SOLN
INTRAVENOUS | Status: AC
  Filled 2017-04-13: qty 250

## 2017-04-13 MED ORDER — HEPARIN SODIUM (PORCINE) 1000 UNIT/ML IJ SOLN
INTRAMUSCULAR | Status: AC
  Filled 2017-04-13: qty 1

## 2017-04-13 MED ORDER — IODIXANOL 320 MG/ML IV SOLN
INTRAVENOUS | Status: DC | PRN
  Administered 2017-04-13: 110 mL via INTRA_ARTERIAL

## 2017-04-13 MED ORDER — NITROGLYCERIN IN D5W 200-5 MCG/ML-% IV SOLN
0.0000 ug/min | INTRAVENOUS | Status: DC
  Administered 2017-04-13: 20 ug/min via INTRAVENOUS

## 2017-04-13 MED ORDER — HEPARIN (PORCINE) IN NACL 2-0.9 UNIT/ML-% IJ SOLN
INTRAMUSCULAR | Status: AC
  Filled 2017-04-13: qty 1000

## 2017-04-13 MED ORDER — ACETAMINOPHEN 325 MG PO TABS
650.0000 mg | ORAL_TABLET | ORAL | Status: DC | PRN
Start: 2017-04-13 — End: 2017-04-14

## 2017-04-13 MED ORDER — HEPARIN (PORCINE) IN NACL 2-0.9 UNIT/ML-% IJ SOLN
INTRAMUSCULAR | Status: AC | PRN
  Administered 2017-04-13: 1500 mL

## 2017-04-13 MED ORDER — ASPIRIN 81 MG PO CHEW
CHEWABLE_TABLET | ORAL | Status: AC
  Filled 2017-04-13: qty 1

## 2017-04-13 MED ORDER — ANGIOPLASTY BOOK
Freq: Once | Status: AC
  Administered 2017-04-13: 21:00:00 1
  Filled 2017-04-13: qty 1

## 2017-04-13 MED ORDER — HEPARIN SODIUM (PORCINE) 1000 UNIT/ML IJ SOLN
INTRAMUSCULAR | Status: DC | PRN
  Administered 2017-04-13: 2500 [IU] via INTRAVENOUS
  Administered 2017-04-13: 3000 [IU] via INTRAVENOUS
  Administered 2017-04-13: 8000 [IU] via INTRAVENOUS
  Administered 2017-04-13: 2500 [IU] via INTRAVENOUS

## 2017-04-13 MED ORDER — ONDANSETRON HCL 4 MG/2ML IJ SOLN
4.0000 mg | Freq: Four times a day (QID) | INTRAMUSCULAR | Status: DC | PRN
  Administered 2017-04-13: 4 mg via INTRAVENOUS
  Filled 2017-04-13: qty 2

## 2017-04-13 MED ORDER — HYDRALAZINE HCL 20 MG/ML IJ SOLN
10.0000 mg | Freq: Once | INTRAMUSCULAR | Status: AC
  Administered 2017-04-13: 16:00:00 10 mg via INTRAVENOUS

## 2017-04-13 MED ORDER — SODIUM CHLORIDE 0.9 % IV SOLN
250.0000 mL | INTRAVENOUS | Status: DC | PRN
  Administered 2017-04-13: 100 mL via INTRAVENOUS

## 2017-04-13 MED ORDER — CLOPIDOGREL BISULFATE 300 MG PO TABS
ORAL_TABLET | ORAL | Status: AC
  Filled 2017-04-13: qty 1

## 2017-04-13 MED ORDER — PRAVASTATIN SODIUM 40 MG PO TABS
20.0000 mg | ORAL_TABLET | Freq: Every evening | ORAL | Status: DC
  Administered 2017-04-13: 20 mg via ORAL
  Filled 2017-04-13: qty 1

## 2017-04-13 MED ORDER — SODIUM CHLORIDE 0.9% FLUSH: 3.0000 mL | Freq: Two times a day (BID) | INTRAVENOUS | Status: DC

## 2017-04-13 MED ORDER — ASPIRIN 81 MG PO TABS: 81.0000 mg | ORAL_TABLET | Freq: Every day | ORAL | Status: DC

## 2017-04-13 MED ORDER — METOPROLOL TARTRATE 25 MG PO TABS
50.0000 mg | ORAL_TABLET | Freq: Every day | ORAL | Status: DC
  Administered 2017-04-13: 21:00:00 50 mg via ORAL
  Filled 2017-04-13: qty 2

## 2017-04-13 MED ORDER — LIDOCAINE HCL (PF) 1 % IJ SOLN
INTRAMUSCULAR | Status: DC | PRN
  Administered 2017-04-13: 15 mL via INTRADERMAL

## 2017-04-13 MED ORDER — NITROGLYCERIN IN D5W 200-5 MCG/ML-% IV SOLN: 0.0000 ug/min | INTRAVENOUS | Status: DC

## 2017-04-13 MED ORDER — NITROGLYCERIN 1 MG/10 ML FOR IR/CATH LAB
INTRA_ARTERIAL | Status: DC | PRN
  Administered 2017-04-13 (×6): 200 ug via INTRA_ARTERIAL

## 2017-04-13 MED ORDER — ASPIRIN 81 MG PO CHEW
81.0000 mg | CHEWABLE_TABLET | ORAL | Status: AC
  Administered 2017-04-13: 81 mg via ORAL

## 2017-04-13 MED ORDER — MORPHINE SULFATE (PF) 4 MG/ML IV SOLN: 2.0000 mg | INTRAVENOUS | Status: DC | PRN

## 2017-04-13 MED ORDER — SODIUM CHLORIDE 0.9 % IV SOLN
INTRAVENOUS | Status: DC | PRN
  Administered 2017-04-13 (×2): 1000 mL via SURGICAL_CAVITY

## 2017-04-13 MED ORDER — LIDOCAINE HCL (PF) 1 % IJ SOLN
INTRAMUSCULAR | Status: AC
  Filled 2017-04-13: qty 30

## 2017-04-13 MED ORDER — CLOPIDOGREL BISULFATE 300 MG PO TABS
ORAL_TABLET | ORAL | Status: DC | PRN
  Administered 2017-04-13: 300 mg via ORAL

## 2017-04-13 MED ORDER — HYDRALAZINE HCL 20 MG/ML IJ SOLN
5.0000 mg | INTRAMUSCULAR | Status: DC | PRN
  Administered 2017-04-13: 5 mg via INTRAVENOUS
  Filled 2017-04-13 (×3): qty 1

## 2017-04-13 MED ORDER — LABETALOL HCL 5 MG/ML IV SOLN: 10.0000 mg | INTRAVENOUS | Status: DC | PRN

## 2017-04-13 MED ORDER — SODIUM CHLORIDE 0.9 % WEIGHT BASED INFUSION: 1.0000 mL/kg/h | INTRAVENOUS | Status: DC

## 2017-04-13 MED ORDER — CLOPIDOGREL BISULFATE 75 MG PO TABS
75.0000 mg | ORAL_TABLET | Freq: Every day | ORAL | Status: DC
  Administered 2017-04-14: 09:00:00 75 mg via ORAL
  Filled 2017-04-13: qty 1

## 2017-04-13 MED ORDER — SODIUM CHLORIDE 0.9 % IV SOLN
INTRAVENOUS | Status: AC
  Administered 2017-04-13: 14:00:00 via INTRAVENOUS

## 2017-04-13 MED ORDER — ASPIRIN EC 81 MG PO TBEC
81.0000 mg | DELAYED_RELEASE_TABLET | Freq: Every day | ORAL | Status: DC
  Administered 2017-04-14: 81 mg via ORAL
  Filled 2017-04-13: qty 1

## 2017-04-13 SURGICAL SUPPLY — 36 items
BALLN ADMIRAL INPACT 5X250 (BALLOONS) ×3
BALLN COYOTE OTW 4X220X150 (BALLOONS) ×3
BALLN IN.PACT DCB 5X120 (BALLOONS) ×3
BALLOON ADMIRAL INPACT 5X250 (BALLOONS) IMPLANT
BALLOON COYOTE OTW 4X220X150 (BALLOONS) IMPLANT
CATH ANGIO 5F BER2 65CM (CATHETERS) ×1 IMPLANT
CATH ANGIO 5F PIGTAIL 65CM (CATHETERS) IMPLANT
CATH CROSS OVER TEMPO 5F (CATHETERS) ×1 IMPLANT
CATH QUICKCROSS .018X135CM (MICROCATHETER) ×1 IMPLANT
DCB IN.PACT 5X120 (BALLOONS) IMPLANT
DEVICE CONTINUOUS FLUSH (MISCELLANEOUS) ×1 IMPLANT
DEVICE EMBOSHIELD NAV6 4.0-7.0 (WIRE) ×1 IMPLANT
DEVICE TORQUE .025-.038 (MISCELLANEOUS) ×1 IMPLANT
DIAMONDBACK SOLID OAS 1.5MM (CATHETERS) ×3
DIAMONDBACK SOLID OAS 2.0MM (CATHETERS) ×3
GUIDEWIRE LT ZIPWIRE 035X260 (WIRE) ×1 IMPLANT
GUIDEWIRE REGALIA .014X300CM (WIRE) ×1 IMPLANT
KIT ENCORE 26 ADVANTAGE (KITS) ×1 IMPLANT
KIT PV (KITS) ×3 IMPLANT
LUBRICANT VIPERSLIDE CORONARY (MISCELLANEOUS) ×1 IMPLANT
SHEATH HIGHFLEX ANSEL 7FR 55CM (SHEATH) ×1 IMPLANT
SHEATH PINNACLE 5F 10CM (SHEATH) ×1 IMPLANT
SHEATH PINNACLE 7F 10CM (SHEATH) ×1 IMPLANT
SYR MEDRAD MARK V 150ML (SYRINGE) ×3 IMPLANT
SYSTEM DIMNDBCK SLD OAS 1.5MM (CATHETERS) IMPLANT
SYSTEM DIMNDBCK SLD OAS 2.0MM (CATHETERS) IMPLANT
TAPE RADIOPAQUE TURBO (MISCELLANEOUS) ×2 IMPLANT
TRANSDUCER W/STOPCOCK (MISCELLANEOUS) ×3 IMPLANT
TRAY PV CATH (CUSTOM PROCEDURE TRAY) ×3 IMPLANT
WIRE AMPLATZ SS-J .035X180CM (WIRE) ×1 IMPLANT
WIRE HI TORQ VERSACORE J 260CM (WIRE) ×1 IMPLANT
WIRE HITORQ VERSACORE ST 145CM (WIRE) ×1 IMPLANT
WIRE ROSEN-J .035X260CM (WIRE) ×1 IMPLANT
WIRE SPARTACORE .014X300CM (WIRE) ×2 IMPLANT
WIRE TREASURE-12 .018X300CM (WIRE) ×1 IMPLANT
WIRE VIPER ADVANCE .017X335CM (WIRE) ×1 IMPLANT

## 2017-04-13 NOTE — Progress Notes (Signed)
Site area: right groin  Site Prior to Removal:  Level 1  Pressure Applied For 20 MINUTES    Minutes Beginning at 1810  Manual:   Yes.    Patient Status During Pull:  Mild vagal reaction  Post Pull Groin Site:  Level 0  Post Pull Instructions Given:  Yes.    Post Pull Pulses Present:  Yes.    Dressing Applied:  Yes.    Comments:

## 2017-04-13 NOTE — Care Management Note (Signed)
Case Management Note  Patient Details  Name: [name] MRN: [id number] Date of Birth: [birth year]  Subjective/Objective:   From home,  pta indep, s/p pv intervention, will be on plavix.  For dc to home , no needs.                  Action/Plan: DC home no needs.  Expected Discharge Date:  04/14/17               Expected Discharge Plan:  Home/Self Care  In-House Referral:     Discharge planning Services  CM Consult  Post Acute Care Choice:    Choice offered to:     DME Arranged:    DME Agency:     HH Arranged:    HH Agency:     Status of Service:  Completed, signed off  If discussed at [employer] Length of Stay Meetings, dates discussed:    Additional Comments:  Leone Havenaylor, Kaimana Lurz Clinton, RN 04/13/2017, 3:27 PM

## 2017-04-13 NOTE — H&P (Signed)
Runell GessBerry, Saidy Ormand J, MD  Physician  Cardiology  Progress Notes  Signed  Encounter Date:  02/18/2017       Related encounter[facility]      Signed      Expand All Collapse All       [] Hide copied text  [] Hover for details       04/13/17 [name]   1941-01-27  [id number]  Primary Physician Aida PufferLittle, James, MD Primary Cardiologist: Runell GessJonathan J Aydn Ferrara MD Milagros LollFACP, FACC, [place], [place]  HPI:  [name] is a 76 y.o. male mildly overweight married Caucasian male whoI last saw him in the office 07/25/16. He is the father of one child, grandfather and one grandchild. I last saw him in the office 09/12/16. 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 Ashley Marinerub Owen December 27, 2004, for left main 3-vessel disease. He had a LIMA to his LAD, a vein to his circumflex marginal branch as well as to the PDA. His other problems include hypertension and hyperlipidemia. He denies chest pain or shortness of breath. He does have left ABI in the 0.85range with a high-frequency signal in his left SFA by duplex ultrasound 10/20/12 but did not claudication at that time.His last Myoview performed 06/27/11 was nonischemic.4 years ago he developed lifestyle limiting bilateral lower extremity claudication. Should be noted that he did stop smoking at the time of his bypass surgery in 2006. He underwent peripheral angiography by myself 08/04/16 revealing a long segment calcified occlusion right SFA and high-grade diffuse calcified left SFA stenosis. I did not think that he could be percutaneously revascularized on the right, and therefore opted for medical therapy. This Pletal has been titrated to 100 mg by mouth twice a day without significant improvement in his claudication symptoms. These are lifestyle limiting. He denies chest pain or shortness of breath.   ActiveMedications      Current Meds  Medication Sig  . aspirin 81 MG tablet Take  81 mg by mouth at bedtime.   . cilostazol (PLETAL) 100 MG tablet Take 1 tablet (100 mg total) by mouth 2 (two) times daily.  . metoprolol (LOPRESSOR) 50 MG tablet Take 50 mg by mouth at bedtime.  . naproxen sodium (ANAPROX) 220 MG tablet Take 440 mg by mouth at bedtime.  . Omega-3 Fatty Acids (FISH OIL) 1200 MG CAPS Take 2,400 capsules by mouth every morning.   . Vitamin D, Ergocalciferol, (DRISDOL) 50000 UNITS CAPS capsule Take 50,000 Units by mouth every Sunday.             Allergies  Allergen Reactions  . Crestor [Rosuvastatin] Other (See Comments)    Myalgias  . Lisinopril     cough    Social History        Social History  . Marital status: Married    Spouse name: cathy  . Number of children: 1  . Years of education: 4912       Occupational History  . retired          Social History Main Topics  . Smoking status: Former Smoker    Packs/day: 1.00    Years: 50.00    Types: Cigarettes    Quit date: 12/17/2004  . Smokeless tobacco: Never Used  . Alcohol use Yes     Comment: occasionally  . Drug use: No  . Sexual activity: Not on file       Other Topics Concern  .  Not on file      Social History Narrative   Lives with wife   Caffeine - coffee, 1 cup daily     Review of Systems: General: negative for chills, fever, night sweats or weight changes.  Cardiovascular: negative for chest pain, dyspnea on exertion, edema, orthopnea, palpitations, paroxysmal nocturnal dyspnea or shortness of breath Dermatological: negative for rash Respiratory: negative for cough or wheezing Urologic: negative for hematuria Abdominal: negative for nausea, vomiting, diarrhea, bright red blood per rectum, melena, or hematemesis Neurologic: negative for visual changes, syncope, or dizziness All other systems reviewed and are otherwise negative except as noted above.    Blood pressure 140/80, pulse 70, height 6\' 1"  (1.854 m), weight 214 lb (97.1 kg).    General appearance: alert and no distress Neck: no adenopathy, no carotid bruit, no JVD, supple, symmetrical, trachea midline and thyroid not enlarged, symmetric, no tenderness/mass/nodules Lungs: clear to auscultation bilaterally Heart: regular rate and rhythm, S1, S2 normal, no murmur, click, rub or gallop Extremities: extremities normal, atraumatic, no cyanosis or edema Pulses: 2+ and symmetric Diminished pedal pulses bilaterally Skin: Skin color, texture, turgor normal. No rashes or lesions Neurologic: Alert and oriented X 3, normal strength and tone. Normal symmetric reflexes. Normal coordination and gait  EKG not performed today  ASSESSMENT AND PLAN:   Hx of CABG History of coronary artery bypass grafting 3 in 2006 the LIMA to his LAD, vein to obtuse marginal branch and PDA. His last Myoview performed in 2013 was low risk. He denies chest pain or shortness of breath.  HTN (hypertension) History of essential hypertension blood pressure is 140/80. He is on metoprolol. Continue current meds  Dyslipidemia History of dyslipidemia on statin therapy followed by his PCP  Peripheral vascular disease (HCC) History of peripheral arterial disease with lifestyle limiting claudication. Angiography performed by myself 08/04/16 revealed an occluded right SFA at the origin down to the adductor canal which was highly calcified with 2 vessel runoff and diffuse high-grade segmental disease in the left SFA which was highly calcified as well. The patient is on maximum doses of Pletal. He wishes to pursue percutaneous revascularization of his left SFA with staged potential consideration of right femoropopliteal bypass grafting.      Runell GessJonathan J. Ferol Laiche, M.D., FACP, [facility]ACC, Earl LagosFAHA, [facility]SCAI [facility] [address] [place], [place]  [address]  [phone] 04/13/2017 10:03 AM

## 2017-04-13 NOTE — Interval H&P Note (Signed)
History and Physical Interval Note:  04/13/2017 10:48 AM  Dareen PianoAlford Crowl  has presented today for surgery, with the diagnosis of claudication  The various methods of treatment have been discussed with the patient and family. After consideration of risks, benefits and other options for treatment, the patient has consented to  Procedure(s): LOWER EXTREMITY ANGIOGRAPHY (N/A) as a surgical intervention .  The patient's history has been reviewed, patient examined, no change in status, stable for surgery.  I have reviewed the patient's chart and labs.  Questions were answered to the patient's satisfaction.     Nanetta BattyJonathan Berry

## 2017-04-14 DIAGNOSIS — E782 Mixed hyperlipidemia: Secondary | ICD-10-CM | POA: Diagnosis not present

## 2017-04-14 DIAGNOSIS — I739 Peripheral vascular disease, unspecified: Secondary | ICD-10-CM

## 2017-04-14 DIAGNOSIS — I25119 Atherosclerotic heart disease of native coronary artery with unspecified angina pectoris: Secondary | ICD-10-CM

## 2017-04-14 DIAGNOSIS — I70212 Atherosclerosis of native arteries of extremities with intermittent claudication, left leg: Secondary | ICD-10-CM | POA: Diagnosis not present

## 2017-04-14 LAB — BASIC METABOLIC PANEL
Anion gap: 6 (ref 5–15)
BUN: 17 mg/dL (ref 6–20)
CHLORIDE: 109 mmol/L (ref 101–111)
CO2: 24 mmol/L (ref 22–32)
CREATININE: 0.75 mg/dL (ref 0.61–1.24)
Calcium: 8.8 mg/dL — ABNORMAL LOW (ref 8.9–10.3)
GFR calc Af Amer: >60 mL/min (ref >60)
GFR calc non Af Amer: >60 mL/min (ref >60)
Glucose, Bld: 112 mg/dL — ABNORMAL HIGH (ref 65–99)
Potassium: 3.8 mmol/L (ref 3.5–5.1)
Sodium: 139 mmol/L (ref 135–145)

## 2017-04-14 LAB — CBC
HCT: 38 % — ABNORMAL LOW (ref 39.0–52.0)
Hemoglobin: 12.6 g/dL — ABNORMAL LOW (ref 13.0–17.0)
MCH: 29.1 pg (ref 26.0–34.0)
MCHC: 33.2 g/dL (ref 30.0–36.0)
MCV: 87.8 fL (ref 78.0–100.0)
PLATELETS: 198 10*3/uL (ref 150–400)
RBC: 4.33 MIL/uL (ref 4.22–5.81)
RDW: 14.4 % (ref 11.5–15.5)
WBC: 11.1 10*3/uL — ABNORMAL HIGH (ref 4.0–10.5)

## 2017-04-14 MED ORDER — CLOPIDOGREL BISULFATE 75 MG PO TABS: 75.0000 mg | ORAL_TABLET | Freq: Every day | ORAL | 3 refills | Status: DC

## 2017-04-14 MED ORDER — FISH OIL 1200 MG PO CAPS: 2.0000 | ORAL_CAPSULE | Freq: Every morning | ORAL | Status: AC

## 2017-04-14 MED FILL — Verapamil HCl IV Soln 2.5 MG/ML: INTRAVENOUS | Qty: 2 | Status: AC

## 2017-04-14 NOTE — Discharge Summary (Signed)
Discharge Summary    Patient ID: [name],  MRN: [id number], DOB/AGE: [birth year] 76 y.o.  Admit date: 04/13/2017 Discharge date: 04/14/2017  Primary Care Provider: Aida PufferLittle, James Primary Cardiologist: Allyson SabalBerry   Discharge Diagnoses    Active Problems:   Peripheral vascular disease [facility](HCC)   Claudication in peripheral vascular disease (HCC)   Allergies Allergies  Allergen Reactions  . Crestor [Rosuvastatin] Other (See Comments)    Myalgias  . Lisinopril     cough    Diagnostic Studies/Procedures    PV angiogram: 04/13/17  Final Impression: Successful diamondback orbital rotational atherectomy followed by drug eluting balloon angioplasty using distal protection of the entire calcified left SFA for lifestyle limiting claudication. The patient did receive 300 mg of Plavix at the end of the case. The sheath will be removed and pressure held once the ACT falls below 170. Additionally hydrated overnight and discharged home in the morning on dual antiplatelet therapy. We will arrange lower extremity arterial Doppler studies in our [facility] office next week and I will see him back in 2-3 weeks thereafter.  Nanetta BattyJonathan Berry. 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 Cornelius Moraswen December 27, 2004, for left main 3-vessel disease. He had a LIMA to his LAD, a vein to his circumflex marginal branch as well as to the PDA. His other problems include hypertension and hyperlipidemia. He was noted to have left ABI in the 0.85 range with a high-frequency signal in his left SFA by duplex ultrasound 10/20/12 but did not report claudication at that time.His last Myoview performed 06/27/11 was nonischemic.4 years ago he developed lifestyle limiting bilateral lower extremity claudication. Should be noted that he did stop smoking at the time of his bypass surgery in 2006. He underwent peripheral  angiography by Dr. Allyson SabalBerry 08/04/16 revealing a long segment calcified occlusion right SFA and high-grade diffuse calcified left SFA stenosis. It was felt that he could not be percutaneously revascularized on the right,and therefore opted for medical therapy.His pletal was titrated to 100 mg by mouth twice a day without significant improvement in his claudication symptoms. These symptoms were lifestyle limiting. Given his report he was set up for outpatient PV angiogram.  Hospital Course     Underwent successful diamondback orbital rotational atherectomy with drug eluting balloon angioplasty of the entire left SFA. Plan for ASA/Plavix in addition to his home dose of pletal. No complications noted post cath. Labs showed Cr 0.75 and Hgb 12.6. Able to ambulate the following day without any issues.  [name] was seen by Dr. Eldridge DaceVaranasi and determined stable for discharge home. Follow up in the office has been arranged. Medications are listed below.   _____________  Discharge Vitals Blood pressure 101/65, pulse 74, temperature 98 F (36.7 C), temperature source Oral, resp. rate 17, height 6\' 1"  (1.854 m), weight 217 lb 6 oz (98.6 kg), SpO2 93 %.  Filed Weights   04/13/17 0833 04/14/17 0333  Weight: 214 lb (97.1 kg) 217 lb 6 oz (98.6 kg)    Labs & Radiologic Studies    CBC Recent Labs    04/14/17 0321  WBC 11.1*  HGB 12.6*  HCT 38.0*  MCV 87.8  PLT 198   Basic Metabolic Panel Recent Labs    82/95/6211/27/18 0321  NA 139  K 3.8  CL 109  CO2 24  GLUCOSE 112*  BUN 17  CREATININE 0.75  CALCIUM 8.8*   Liver Function Tests No results for input(s): AST, ALT, ALKPHOS, BILITOT, PROT, ALBUMIN in the last 72 hours. No results for input(s): LIPASE, AMYLASE in the last 72 hours. Cardiac Enzymes No results for input(s): CKTOTAL, CKMB, CKMBINDEX, TROPONINI in the last 72 hours. BNP Invalid input(s): POCBNP D-Dimer No results for input(s): DDIMER in the last 72 hours. Hemoglobin A1C No results  for input(s): HGBA1C in the last 72 hours. Fasting Lipid Panel No results for input(s): CHOL, HDL, LDLCALC, TRIG, CHOLHDL, LDLDIRECT in the last 72 hours. Thyroid Function Tests No results for input(s): TSH, T4TOTAL, T3FREE, THYROIDAB in the last 72 hours.  Invalid input(s): FREET3 _____________  No results found. Disposition   Pt is being discharged home today in good condition.  Follow-up Plans & Appointments    Follow-up Information    [facility] Follow up on 04/21/2017.   Specialty:  Cardiology Why:  at 9am for your follow up dopplers Contact information: [address] [place] [id number] [phone]       Runell GessBerry, Jonathan J, MD Follow up on 04/28/2017.   Specialties:  Cardiology, Radiology Why:  at 9:15am for your follow up appt.  Contact information: [address] [place] [address] [phone]          Discharge Instructions    Call MD for:  redness, tenderness, or signs of infection (pain, swelling, redness, odor or green/yellow discharge around incision site)   Complete by:  As directed    Diet - low sodium heart healthy   Complete by:  As directed    Discharge instructions   Complete by:  As directed    Groin Site Care Refer to this sheet in the next few weeks. These instructions provide you with information on caring for yourself after your procedure. Your caregiver may also give you more specific instructions. Your treatment has been planned according to current medical practices, but problems sometimes occur. Call your caregiver if you have any problems or questions after your procedure. HOME CARE INSTRUCTIONS You may shower 24 hours after the procedure. Remove the bandage (dressing) and gently wash the site with plain soap and water. Gently pat the site dry.  Do not apply powder or lotion to the site.  Do not sit in a bathtub, swimming pool, or whirlpool for 5 to 7 days.  No bending,  squatting, or lifting anything over 10 pounds (4.5 kg) as directed by your caregiver.  Inspect the site at least twice daily.  Do not drive home if you are discharged the same day of the procedure. Have someone else drive you.  You may drive 24 hours after the procedure unless otherwise instructed by your caregiver.  What to expect: Any bruising will usually fade within 1 to 2 weeks.  Blood that collects in the tissue (hematoma) may be painful to the touch. It should usually decrease in size and tenderness within 1 to 2 weeks.  SEEK IMMEDIATE MEDICAL CARE IF: You have unusual pain at the groin site or down the affected leg.  You have redness, warmth, swelling, or pain at the groin site.  You have drainage (other than a small amount of blood on the dressing).  You have chills.  You have a fever or persistent symptoms for more than 72 hours.  You have a fever and your symptoms suddenly get worse.  Your leg becomes pale, cool, tingly, or numb.  You have heavy bleeding from the site. Hold  pressure on the site. Marland Kitchen  PLEASE DO NOT MISS ANY DOSES OF YOUR PLAVIX!!!!! Also keep a log of you blood pressures and bring back to your follow up appt. Please call the office with any questions.   Patients taking blood thinners should generally stay away from medicines like ibuprofen, Advil, Motrin, naproxen, and Aleve due to risk of stomach bleeding. You may take Tylenol as directed or talk to your primary doctor about alternatives.   Increase activity slowly   Complete by:  As directed       Discharge Medications     Medication List    STOP taking these medications   naproxen sodium 220 MG tablet Commonly known as:  ALEVE     TAKE these medications   aspirin 81 MG tablet Take 81 mg by mouth at bedtime.   cilostazol 100 MG tablet Commonly known as:  PLETAL Take 1 tablet (100 mg total) by mouth 2 (two) times daily.   clopidogrel 75 MG tablet Commonly known as:  PLAVIX Take 1 tablet (75 mg  total) by mouth daily with breakfast. Start taking on:  04/15/2017   Fish Oil 1200 MG Caps Take 2 capsules (2,400 mg total) by mouth every morning. What changed:  how much to take   metoprolol tartrate 50 MG tablet Commonly known as:  LOPRESSOR Take 50 mg by mouth at bedtime.   pravastatin 20 MG tablet Commonly known as:  PRAVACHOL Take 1 tablet (20 mg total) by mouth every evening.   Vitamin D (Ergocalciferol) 50000 units Caps capsule Commonly known as:  DRISDOL Take 50,000 Units by mouth every Sunday.        Outstanding Labs/Studies   Follow up dopplers   Duration of Discharge Encounter   Greater than 30 minutes including physician time.  Signed, Laverda Page NP-C 04/14/2017, 8:47 AM   I have examined the patient and reviewed assessment and plan and discussed with patient.  Agree with above as stated.  Pulses Dopplerable, PT bilaterally.  Stop Aleve given Plavix use.  OK to continue Pletal given right SFA occlusion.  No right groin hematoma. Plan f/u with Dr. Allyson Sabal.  All questions answered.  Lance Muss

## 2017-04-15 ENCOUNTER — Telehealth: Payer: Self-pay | Admitting: Cardiovascular Disease

## 2017-04-15 LAB — POCT ACTIVATED CLOTTING TIME: ACTIVATED CLOTTING TIME: 169 s

## 2017-04-15 NOTE — Telephone Encounter (Signed)
-----   Message from Runell GessJonathan J Berry, MD Lloyd at 04/14/2017  4:47 PM EST ----- Regarding: RE: [name] radiation I will have my nurse [name] take care of that.   JJB ----- Message ----- From: Hazel SamsPifer, [name]: 04/14/2017   2:57 PM To: Arty BaumgartnerLindsay B Roberts, NP, Runell GessJonathan J Berry, MD Subject: [name] radiation                                 Dr. Allyson SabalBerry, [name] had 74 minutes of fluoro time from his procedure on 11-26 and didn't get anything in his discharge paperwork about his exposure. Can you or the provider that sees him at his follow up make sure that he does not have any reactions and let him know what to look for? Thanks,  Adalberto IllAaron Pifer  Here is the standard text we use:  Your procedure required the use of prolonged amounts of x-ray.  Radiation side-effects are unlikely but possible.  Please have a family member inspect your chest and back area daily, for signs of redness or rash two weeks from today.  Please call your provider and tell us whether if you have concerns about your findings.

## 2017-04-15 NOTE — Telephone Encounter (Signed)
Left detail message, ok per DPR, and to call back if any questions/concerns/symptoms.

## 2017-04-21 ENCOUNTER — Ambulatory Visit (HOSPITAL_COMMUNITY)
Admission: RE | Admit: 2017-04-21 | Discharge: 2017-04-21 | Disposition: A | Discharge status: Home / Self Care | Payer: Medicare HMO | Source: Ambulatory Visit | Attending: Cardiology | Admitting: Cardiology

## 2017-04-21 DIAGNOSIS — I70202 Unspecified atherosclerosis of native arteries of extremities, left leg: Secondary | ICD-10-CM | POA: Diagnosis not present

## 2017-04-21 DIAGNOSIS — R9389 Abnormal findings on diagnostic imaging of other specified body structures: Secondary | ICD-10-CM | POA: Diagnosis not present

## 2017-04-21 DIAGNOSIS — E785 Hyperlipidemia, unspecified: Secondary | ICD-10-CM | POA: Insufficient documentation

## 2017-04-21 DIAGNOSIS — I10 Essential (primary) hypertension: Secondary | ICD-10-CM | POA: Diagnosis not present

## 2017-04-21 DIAGNOSIS — Z87891 Personal history of nicotine dependence: Secondary | ICD-10-CM | POA: Diagnosis not present

## 2017-04-21 DIAGNOSIS — Z951 Presence of aortocoronary bypass graft: Secondary | ICD-10-CM | POA: Insufficient documentation

## 2017-04-21 DIAGNOSIS — I739 Peripheral vascular disease, unspecified: Secondary | ICD-10-CM

## 2017-04-28 ENCOUNTER — Ambulatory Visit: Payer: Medicare HMO | Admitting: Cardiovascular Disease

## 2017-05-01 ENCOUNTER — Ambulatory Visit: Payer: Medicare HMO | Admitting: Cardiovascular Disease

## 2017-05-01 ENCOUNTER — Encounter: Payer: Self-pay | Admitting: Cardiovascular Disease

## 2017-05-01 VITALS — BP 138/72 | HR 73 | Ht 73.0 in | Wt 217.0 lb

## 2017-05-01 DIAGNOSIS — I739 Peripheral vascular disease, unspecified: Secondary | ICD-10-CM

## 2017-05-01 DIAGNOSIS — Z951 Presence of aortocoronary bypass graft: Secondary | ICD-10-CM | POA: Diagnosis not present

## 2017-05-01 DIAGNOSIS — I10 Essential (primary) hypertension: Secondary | ICD-10-CM | POA: Diagnosis not present

## 2017-05-01 DIAGNOSIS — E785 Hyperlipidemia, unspecified: Secondary | ICD-10-CM | POA: Diagnosis not present

## 2017-05-01 MED ORDER — PRAVASTATIN SODIUM 40 MG PO TABS: 40.0000 mg | ORAL_TABLET | Freq: Every evening | ORAL | 3 refills | Status: DC

## 2017-05-01 NOTE — Assessment & Plan Note (Signed)
History of dyslipidemia on Pravachol 20 mg a day recent lipid profile performed 09/16/16 revealing an LDL of 110. I am going to increase his Pravachol from 20-40 mg a day and will recheck a lipid liver profile in 2 months

## 2017-05-01 NOTE — Assessment & Plan Note (Signed)
History of essential hypertension blood pressure measured 138/72. He is on metoprolol. Continue current meds at current dosing

## 2017-05-01 NOTE — Progress Notes (Signed)
05/01/2017 [name]   1941/05/13  [id number]  Primary Physician Aida PufferLittle, James, MD Primary Cardiologist: Runell GessJonathan J Kemyah Buser MD FACP, [place]ACC, [place], [place]  HPI:  Richard Pianolford Laffoon is a 76 y.o.  mildly overweight married Caucasian male whoI last saw him in the office 02/18/17. He is the father of one child, grandfather and one grandchild. I last saw him in the office 09/12/16. 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 Ashley Marinerub Owen December 27, 2004, for left main 3-vessel disease. He had a LIMA to his LAD, a vein to his circumflex marginal branch as well as to the PDA. His other problems include hypertension and hyperlipidemia. He denies chest pain or shortness of breath. He does have left ABI in the 0.85range with a high-frequency signal in his left SFA by duplex ultrasound 10/20/12 but did not claudication at that time.His last Myoview performed 06/27/11 was nonischemic.4 years ago he developed lifestyle limiting bilateral lower extremity claudication. Should be noted that he did stop smoking at the time of his bypass surgery in 2006. He underwent peripheral angiography by myself 08/04/16 revealing a long segment calcified occlusion right SFA and high-grade diffuse calcified left SFA stenosis. I did not think that he could be percutaneously revascularized on the right, and therefore opted for medical therapy. This Pletal has been titrated to 100 mg by mouth twice a day without significant improvement in his claudication symptoms. These are lifestyle limiting. He denies chest pain or shortness of breath. I performed peripheral angiography and endovascular therapy of his diffusely calcified and highly obstructive left SFA using diamondback orbital rotational atherectomy followed by drug-eluting balloon plasty. This left ABI increased from 0.63-.85 and his claudication has significantly improved and were resolved.   No outpatient medications have been marked as taking for the 05/01/17  encounter (Office Visit) with Runell GessBerry, Ardie Mclennan J, MD.     Allergies  Allergen Reactions  . Crestor [Rosuvastatin] Other (See Comments)    Myalgias  . Lisinopril     cough    Social History   Socioeconomic History  . Marital status: Married    Spouse name: cathy  . Number of children: 1  . Years of education: 9812  . Highest education level: Not on file  Social Needs  . Financial resource strain: Not on file  . Food insecurity - worry: Not on file  . Food insecurity - inability: Not on file  . Transportation needs - medical: Not on file  . Transportation needs - non-medical: Not on file  Occupational History  . Occupation: retired  Tobacco Use  . Smoking status: Former Smoker    Packs/day: 1.00    Years: 50.00    Pack years: 50.00    Types: Cigarettes    Last attempt to quit: 12/17/2004    Years since quitting: 12.3  . Smokeless tobacco: Never Used  Substance and Sexual Activity  . Alcohol use: Yes    Comment: occasionally  . Drug use: No  . Sexual activity: Not on file  Other Topics Concern  . Not on file  Social History Narrative   Lives with wife   Caffeine - coffee, 1 cup daily     Review of Systems: General: negative for chills, fever, night sweats or weight changes.  Cardiovascular: negative for chest pain, dyspnea on exertion, edema, orthopnea, palpitations, paroxysmal nocturnal dyspnea or shortness of breath Dermatological: negative for rash Respiratory: negative for cough or wheezing Urologic: negative for hematuria Abdominal: negative  for nausea, vomiting, diarrhea, bright red blood per rectum, melena, or hematemesis Neurologic: negative for visual changes, syncope, or dizziness All other systems reviewed and are otherwise negative except as noted above.    Blood pressure 138/72, pulse 73, height 6\' 1"  (1.854 m), weight 217 lb (98.4 kg), SpO2 94 %.  General appearance: alert and no distress Neck: no adenopathy, no carotid bruit, no JVD, supple,  symmetrical, trachea midline and thyroid not enlarged, symmetric, no tenderness/mass/nodules Lungs: clear to auscultation bilaterally Heart: regular rate and rhythm, S1, S2 normal, no murmur, click, rub or gallop Extremities: extremities normal, atraumatic, no cyanosis or edema Pulses: Absent right pedal pulse Skin: Skin color, texture, turgor normal. No rashes or lesions Neurologic: Alert and oriented X 3, normal strength and tone. Normal symmetric reflexes. Normal coordination and gait  EKG not performed today  ASSESSMENT AND PLAN:   Hx of CABG History of coronary artery bypass grafting 12/27/04 with Myoview performed 06/27/11 which is nonischemic. He denies chest pain or shortness of breath.  HTN (hypertension) History of essential hypertension blood pressure measured 138/72. He is on metoprolol. Continue current meds at current dosing  Dyslipidemia History of dyslipidemia on Pravachol 20 mg a day recent lipid profile performed 09/16/16 revealing an LDL of 110. 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 He ultimately decided to proceed with endovascular therapy on the left which I did on 04/13/17 using diamondback orbital rotational atherectomy followed by drug-eluting balloon angioplasty. His Dopplers significantly improved and his ABIs on the left went from 0.63-.85. His claudication has resolved. He remains on dual antiplatelet therapy.      Runell GessJonathan J. Summers Buendia MD FACP,FACC,FAHA, [facility]SCAI 05/01/2017 8:28 AM

## 2017-05-01 NOTE — Patient Instructions (Signed)
Medication Instructions: Your physician recommends that you continue on your current medications as directed. Please refer to the Current Medication list given to you today.  Increase Pravachol to 40 mg daily.  STOP Pletal.  Labwork: Your physician recommends that you return for a FASTING lipid profile and hepatic function panel in 2 months.   Testing/Procedures: Your physician has requested that you have a lower extremity arterial duplex. During this test, ultrasound is used to evaluate arterial blood flow in the legs. Allow one hour for this exam. There are no restrictions or special instructions.  Your physician has requested that you have an ankle brachial index (ABI). During this test an ultrasound and blood pressure cuff are used to evaluate the arteries that supply the arms and legs with blood. Allow thirty minutes for this exam. There are no restrictions or special instructions. (will repeat every 6 months)   Follow-Up: We request that you follow-up in: 6 months with an extender and in 12 months with Dr San MorelleBerry  You will receive a reminder letter in the mail two months in advance. If you don't receive a letter, please call our office to schedule the follow-up appointment.  If you need a refill on your cardiac medications before your next appointment, please call your pharmacy.

## 2017-05-01 NOTE — Assessment & Plan Note (Signed)
History of coronary artery bypass grafting 12/27/04 with Myoview performed 06/27/11 which is nonischemic. He denies chest pain or shortness of breath.

## 2017-05-01 NOTE — Assessment & Plan Note (Signed)
History of twice a day with angiogram performed 08/04/16 revealing a totally occluded right SFA from the origin down to the adductor canal is highly calcified and not percutaneously revascularizable with diffuse high-grade calcific obstructive disease in the left SFA. He ultimately decided to proceed with endovascular therapy on the left which I did on 04/13/17 using diamondback orbital rotational atherectomy followed by drug-eluting balloon angioplasty. His Dopplers significantly improved and his ABIs on the left went from 0.63-.85. His claudication has resolved. He remains on dual antiplatelet therapy.

## 2017-05-01 NOTE — Addendum Note (Signed)
Addended by: Evans LanceSTOVER, Elyshia Kumagai W on: 05/01/2017 08:33 AM   Modules accepted: Orders

## 2017-06-29 LAB — LIPID PANEL
CHOL/HDL RATIO: 3.1 ratio (ref 0.0–5.0)
Cholesterol, Total: 122 mg/dL (ref 100–199)
HDL: 39 mg/dL — AB (ref >39)
LDL CALC: 70 mg/dL (ref 0–99)
TRIGLYCERIDES: 65 mg/dL (ref 0–149)
VLDL CHOLESTEROL CAL: 13 mg/dL (ref 5–40)

## 2017-06-29 LAB — HEPATIC FUNCTION PANEL
ALT: 14 IU/L (ref 0–44)
AST: 17 IU/L (ref 0–40)
Albumin: 4.3 g/dL (ref 3.5–4.8)
Alkaline Phosphatase: 76 IU/L (ref 39–117)
Bilirubin Total: 0.9 mg/dL (ref 0.0–1.2)
Bilirubin, Direct: 0.27 mg/dL (ref 0.00–0.40)
Total Protein: 6.8 g/dL (ref 6.0–8.5)

## 2017-07-03 NOTE — Progress Notes (Signed)
Statin holiday for 2 months and if Sx resolve have him come in to discuss alternatives with Belenda CruiseKristin

## 2017-07-17 NOTE — Progress Notes (Signed)
Have him take a statin holiday for 4-6 weeks and then see Belenda CruiseKristin back in the office in 2 months for follow-up

## 2017-08-17 ENCOUNTER — Other Ambulatory Visit: Payer: Self-pay | Admitting: Cardiology

## 2017-08-18 NOTE — Telephone Encounter (Signed)
Rx has been sent to the pharmacy electronically. ° °

## 2018-01-12 ENCOUNTER — Ambulatory Visit: Payer: Medicare HMO | Admitting: Cardiovascular Disease

## 2018-01-12 ENCOUNTER — Encounter: Payer: Self-pay | Admitting: Cardiovascular Disease

## 2018-01-12 VITALS — BP 150/68 | HR 63 | Ht 73.0 in | Wt 220.8 lb

## 2018-01-12 DIAGNOSIS — E785 Hyperlipidemia, unspecified: Secondary | ICD-10-CM | POA: Diagnosis not present

## 2018-01-12 DIAGNOSIS — I739 Peripheral vascular disease, unspecified: Secondary | ICD-10-CM | POA: Diagnosis not present

## 2018-01-12 DIAGNOSIS — I10 Essential (primary) hypertension: Secondary | ICD-10-CM

## 2018-01-12 DIAGNOSIS — Z951 Presence of aortocoronary bypass graft: Secondary | ICD-10-CM | POA: Diagnosis not present

## 2018-01-12 LAB — LIPID PANEL
CHOL/HDL RATIO: 4.6 ratio (ref 0.0–5.0)
Cholesterol, Total: 199 mg/dL (ref 100–199)
HDL: 43 mg/dL (ref >39)
LDL Calculated: 135 mg/dL — ABNORMAL HIGH (ref 0–99)
TRIGLYCERIDES: 107 mg/dL (ref 0–149)
VLDL CHOLESTEROL CAL: 21 mg/dL (ref 5–40)

## 2018-01-12 LAB — HEPATIC FUNCTION PANEL
ALK PHOS: 83 IU/L (ref 39–117)
ALT: 16 IU/L (ref 0–44)
AST: 14 IU/L (ref 0–40)
Albumin: 4.6 g/dL (ref 3.5–4.8)
Bilirubin Total: 1.6 mg/dL — ABNORMAL HIGH (ref 0.0–1.2)
Bilirubin, Direct: 0.33 mg/dL (ref 0.00–0.40)
TOTAL PROTEIN: 7.1 g/dL (ref 6.0–8.5)

## 2018-01-12 NOTE — Assessment & Plan Note (Signed)
History of peripheral arterial disease with a known occluded right SFA and angiography 3/18 diffusely diseased left SFA which ultimately underwent endovascular therapy with diamondback orbital rotational atherectomy and drug-eluting balloon angioplasty 04/13/2017 with follow-up Doppler performed 04/21/2017 revealing this to be widely patent.  This did improve his claudication on that side.  Does occasionally get claudication on the right but this is only surgically revascularizable.

## 2018-01-12 NOTE — Patient Instructions (Signed)
Medication Instructions:   NO CHANGE  Labwork:  Your physician recommends that you HAVE LAB WORK TODAY  Testing/Procedures:  Your physician has requested that you have a lower extremity arterial duplex. This test is an ultrasound of the arteries in the legs or arms. It looks at arterial blood flow in the legs and arms. Allow one hour for Lower and Upper Arterial scans. There are no restrictions or special instructions SCHEDULE IN DECEMBER  Follow-Up:  Your physician wants you to follow-up in: 6 MONTHS WITH APP You will receive a reminder letter in the mail two months in advance. If you don't receive a letter, please call our office to schedule the follow-up appointment.   Your physician wants you to follow-up in: ONE YEAR WITH DR San MorelleBERRY You will receive a reminder letter in the mail two months in advance. If you don't receive a letter, please call our office to schedule the follow-up appointment.   If you need a refill on your cardiac medications before your next appointment, please call your pharmacy.

## 2018-01-12 NOTE — Assessment & Plan Note (Signed)
History of CAD status post bypass grafting by Dr. Cornelius Moraswen 12/27/2004 for left main/three-vessel disease.  He did a LIMA to his LAD, vein to the circumflex marginal branch as well as the PDA.  He denies chest pain or shortness of breath.  Last Myoview performed 06/27/2011 was nonischemic.

## 2018-01-12 NOTE — Assessment & Plan Note (Signed)
History of hyperlipidemia intolerant to statin therapy.  He stopped his pravastatin earlier this year.  His last lipid profile performed 06/29/2017 revealed total cholesterol 122, LDL 70 and HDL of 39.  We will explore the possibility of beginning Repatha.

## 2018-01-12 NOTE — Progress Notes (Signed)
01/12/2018 [name]   1940-12-07  [id number]  Primary Physician Aida PufferLittle, James, MD Primary Cardiologist: Runell GessJonathan J Varnell Donate MD FACP, [place], [place], [place]  HPI:  [name] is a 77 y.o.   mildly overweight married Caucasian male whoI last saw him in the office  05/01/2017. 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 Ashley Marinerub Owen December 27, 2004, for left main 3-vessel disease. He had a LIMA to his LAD, a vein to his circumflex marginal branch as well as to the PDA. His other problems include hypertension and hyperlipidemia. He denies chest pain or shortness of breath. He does have left ABI in the 0.85range with a high-frequency signal in his left SFA by duplex ultrasound 10/20/12 but did not claudication at that time.His last Myoview performed 06/27/11 was nonischemic.4 years ago he developed lifestyle limiting bilateral lower extremity claudication. Should be noted that he did stop smoking at the time of his bypass surgery in 2006. He underwent peripheral angiography by myself 08/04/16 revealing a long segment calcified occlusion right SFA and high-grade diffuse calcified left SFA stenosis. I did not think that he could be percutaneously revascularized on the right,and therefore opted for medical therapy.This Pletal has been titrated to 100 mg by mouth twice a day without significant improvement in his claudication symptoms. These are lifestyle limiting. He denies chest pain or shortness of breath. I performed peripheral angiography and endovascular therapy of his diffusely calcified and highly obstructive left SFA using diamondback orbital rotational atherectomy followed by drug-eluting balloon plasty. This left ABI increased from 0.63-.85 and his claudication has significantly improved and were resolved. Since I saw him 8 months ago he is remained stable.  He continues to  enjoy the absence of claudication on the left though he has some discomfort on the right when he walks.  He denies chest pain or shortness of breath.  He did stop his pravastatin earlier this year because of statin intolerance.  Current Meds  Medication Sig  . aspirin 81 MG tablet Take 81 mg by mouth at bedtime.   . clopidogrel (PLAVIX) 75 MG tablet TAKE 1 TABLET (75 MG TOTAL) BY MOUTH DAILY WITH BREAKFAST.  . metoprolol (LOPRESSOR) 50 MG tablet Take 50 mg by mouth at bedtime.  . Omega-3 Fatty Acids (FISH OIL) 1200 MG CAPS Take 2 capsules (2,400 mg total) by mouth every morning.  . Vitamin D, Ergocalciferol, (DRISDOL) 50000 UNITS CAPS capsule Take 50,000 Units by mouth every Sunday.      Allergies  Allergen Reactions  . Crestor [Rosuvastatin] Other (See Comments)    Myalgias  . Lisinopril     cough    Social History   Socioeconomic History  . Marital status: Married    Spouse name: cathy  . Number of children: 1  . Years of education: 8012  . Highest education level: Not on file  Occupational History  . Occupation: retired  Engineer, productionocial Needs  . Financial resource strain: Not on file  . Food insecurity:    Worry: Not on file    Inability: Not on file  . Transportation needs:    Medical: Not on file    Non-medical: Not on file  Tobacco Use  . Smoking status: Former Smoker    Packs/day: 1.00    Years: 50.00    Pack years: 50.00    Types: Cigarettes  Last attempt to quit: 12/17/2004    Years since quitting: 13.0  . Smokeless tobacco: Never Used  Substance and Sexual Activity  . Alcohol use: Yes    Comment: occasionally  . Drug use: No  . Sexual activity: Not on file  Lifestyle  . Physical activity:    Days per week: Not on file    Minutes per session: Not on file  . Stress: Not on file  Relationships  . Social connections:    Talks on phone: Not on file    Gets together: Not on file    Attends religious service: Not on file    Active member of club or organization:  Not on file    Attends meetings of clubs or organizations: Not on file    Relationship status: Not on file  . Intimate partner violence:    Fear of current or ex partner: Not on file    Emotionally abused: Not on file    Physically abused: Not on file    Forced sexual activity: Not on file  Other Topics Concern  . Not on file  Social History Narrative   Lives with wife   Caffeine - coffee, 1 cup daily     Review of Systems: General: negative for chills, fever, night sweats or weight changes.  Cardiovascular: negative for chest pain, dyspnea on exertion, edema, orthopnea, palpitations, paroxysmal nocturnal dyspnea or shortness of breath Dermatological: negative for rash Respiratory: negative for cough or wheezing Urologic: negative for hematuria Abdominal: negative for nausea, vomiting, diarrhea, bright red blood per rectum, melena, or hematemesis Neurologic: negative for visual changes, syncope, or dizziness All other systems reviewed and are otherwise negative except as noted above.    Blood pressure (!) 150/68, pulse 63, height 6\' 1"  (1.854 m), weight 220 lb 12.8 oz (100.2 kg).  General appearance: alert and no distress Neck: no adenopathy, no carotid bruit, no JVD, supple, symmetrical, trachea midline and thyroid not enlarged, symmetric, no tenderness/mass/nodules Lungs: clear to auscultation bilaterally Heart: regular rate and rhythm, S1, S2 normal, no murmur, click, rub or gallop Extremities: extremities normal, atraumatic, no cyanosis or edema Pulses: Absent right pedal pulse Skin: Skin color, texture, turgor normal. No rashes or lesions Neurologic: Alert and oriented X 3, normal strength and tone. Normal symmetric reflexes. Normal coordination and gait  EKG sinus rhythm at 63 without ST or T wave changes.  I personally reviewed this EKG.  ASSESSMENT AND PLAN:   Hx of CABG History of CAD status post bypass grafting by Dr. Cornelius Moras 12/27/2004 for left main/three-vessel  disease.  He did a LIMA to his LAD, vein to the circumflex marginal branch as well as the PDA.  He denies chest pain or shortness of breath.  Last Myoview performed 06/27/2011 was nonischemic.  HTN (hypertension) History of essential hypertension her blood pressure measured at 150/68.  He is on metoprolol.  Continue current meds at current dosing.  Dyslipidemia History of hyperlipidemia intolerant to statin therapy.  He stopped his pravastatin earlier this year.  His last lipid profile performed 06/29/2017 revealed total cholesterol 122, LDL 70 and HDL of 39.  We will explore the possibility of beginning Repatha.  Peripheral vascular disease (HCC) History of peripheral arterial disease with a known occluded right SFA and angiography 3/18 diffusely diseased left SFA which ultimately underwent endovascular therapy with diamondback orbital rotational atherectomy and drug-eluting balloon angioplasty 04/13/2017 with follow-up Doppler performed 04/21/2017 revealing this to be widely patent.  This did improve his claudication on  that side.  Does occasionally get claudication on the right but this is only surgically revascularizable.      [name] MD FACP,FACC,FAHA, [facility] 01/12/2018 9:13 AM

## 2018-01-12 NOTE — Assessment & Plan Note (Signed)
History of essential hypertension her blood pressure measured at 150/68.  He is on metoprolol.  Continue current meds at current dosing.

## 2018-01-14 ENCOUNTER — Encounter: Payer: Self-pay | Admitting: Podiatry

## 2018-01-14 ENCOUNTER — Ambulatory Visit: Payer: Medicare HMO | Admitting: Podiatry

## 2018-01-14 ENCOUNTER — Ambulatory Visit (INDEPENDENT_AMBULATORY_CARE_PROVIDER_SITE_OTHER): Payer: Medicare HMO

## 2018-01-14 DIAGNOSIS — M7752 Other enthesopathy of left foot: Secondary | ICD-10-CM

## 2018-01-14 DIAGNOSIS — M2042 Other hammer toe(s) (acquired), left foot: Secondary | ICD-10-CM

## 2018-01-14 NOTE — Progress Notes (Signed)
He presents today with a chief complaint of painful fifth digit of the left foot.  He states that his PCP had removed a corn and caused some nerve damage in that area and is exquisitely tender with his tennis shoes.  Objective: Vital signs are stable he is alert oriented x3 pulses are palpable.  He has tenderness on palpation of his fifth digit of the left foot.  Radiographs do not demonstrate any osseous abnormalities in the area.  Assessment neuritis fifth digit left foot capsulitis fifth digit left foot.  Plan: After sterile Betadine skin prep I injected 2 mg of dexamethasone local anesthetic fifth PIPJ.  Follow-up with me as needed.  May need to consider surgical intervention.

## 2018-02-01 ENCOUNTER — Telehealth: Payer: Self-pay | Admitting: Cardiovascular Disease

## 2018-02-01 NOTE — Telephone Encounter (Signed)
New Message:      Pt was approved for Repatha, wife wants to know what is the next step?

## 2018-02-04 NOTE — Telephone Encounter (Signed)
LMOM - need to know which pharmacy - [facility] does not fill Repatha in store

## 2018-02-05 MED ORDER — EVOLOCUMAB 140 MG/ML ~~LOC~~ SOAJ: 140.0000 mg | SUBCUTANEOUS | 12 refills | Status: DC

## 2018-02-05 NOTE — Telephone Encounter (Signed)
[facility]   Rx sent.  Patient and wife know to call if cost prohibitive.

## 2018-02-09 ENCOUNTER — Other Ambulatory Visit: Payer: Self-pay | Admitting: Pharmacist Clinician (PhC)/ Clinical Pharmacy Specialist

## 2018-02-09 MED ORDER — EVOLOCUMAB 140 MG/ML ~~LOC~~ SOAJ: 140.0000 mg | SUBCUTANEOUS | 12 refills | Status: DC

## 2018-04-12 ENCOUNTER — Other Ambulatory Visit: Payer: Self-pay | Admitting: Cardiovascular Disease

## 2018-04-12 DIAGNOSIS — Z9862 Peripheral vascular angioplasty status: Secondary | ICD-10-CM

## 2018-04-12 DIAGNOSIS — I739 Peripheral vascular disease, unspecified: Secondary | ICD-10-CM

## 2018-04-20 ENCOUNTER — Encounter (HOSPITAL_COMMUNITY): Payer: Medicare HMO

## 2018-04-26 ENCOUNTER — Ambulatory Visit (HOSPITAL_COMMUNITY)
Admission: RE | Admit: 2018-04-26 | Discharge: 2018-04-26 | Disposition: A | Discharge status: Home / Self Care | Payer: Medicare HMO | Source: Ambulatory Visit | Attending: Cardiology | Admitting: Cardiology

## 2018-04-26 DIAGNOSIS — I739 Peripheral vascular disease, unspecified: Secondary | ICD-10-CM | POA: Diagnosis present

## 2018-04-26 DIAGNOSIS — Z9862 Peripheral vascular angioplasty status: Secondary | ICD-10-CM | POA: Insufficient documentation

## 2018-04-29 ENCOUNTER — Other Ambulatory Visit: Payer: Self-pay | Admitting: *Deleted

## 2018-04-29 DIAGNOSIS — I739 Peripheral vascular disease, unspecified: Secondary | ICD-10-CM

## 2018-06-03 ENCOUNTER — Other Ambulatory Visit: Payer: Self-pay | Admitting: Cardiovascular Disease

## 2018-06-03 NOTE — Telephone Encounter (Signed)
Rx request sent to pharmacy.  

## 2018-06-22 ENCOUNTER — Encounter: Payer: Self-pay | Admitting: Cardiology

## 2018-06-22 ENCOUNTER — Ambulatory Visit: Payer: Medicare HMO | Admitting: Cardiology

## 2018-06-22 DIAGNOSIS — I10 Essential (primary) hypertension: Secondary | ICD-10-CM | POA: Diagnosis not present

## 2018-06-22 DIAGNOSIS — I739 Peripheral vascular disease, unspecified: Secondary | ICD-10-CM | POA: Diagnosis not present

## 2018-06-22 DIAGNOSIS — Z951 Presence of aortocoronary bypass graft: Secondary | ICD-10-CM

## 2018-06-22 DIAGNOSIS — E785 Hyperlipidemia, unspecified: Secondary | ICD-10-CM

## 2018-06-22 DIAGNOSIS — M48061 Spinal stenosis, lumbar region without neurogenic claudication: Secondary | ICD-10-CM

## 2018-06-22 LAB — LIPID PANEL
Chol/HDL Ratio: 4.9 ratio (ref 0.0–5.0)
Cholesterol, Total: 152 mg/dL (ref 100–199)
HDL: 31 mg/dL — ABNORMAL LOW (ref >39)
LDL Calculated: 95 mg/dL (ref 0–99)
Triglycerides: 130 mg/dL (ref 0–149)
VLDL Cholesterol Cal: 26 mg/dL (ref 5–40)

## 2018-06-22 NOTE — Assessment & Plan Note (Signed)
Statin intolerant-approved for Repatha

## 2018-06-22 NOTE — Progress Notes (Signed)
06/22/2018 [name]   1940-06-23  [id number]  Primary Physician [name], MD Primary Cardiologist: Dr Allyson Sabal  HPI: [name] is a 78 year old male with a history of coronary disease and vascular disease.  He had CABG in 2006 with an LIMA to his LAD, and SVG to his CFX marginal branch, and an SVG to the PDA.  Myoview in 2013 was low risk.  He developed claudication and in March 2018 had PV angiogram which revealed revealed high-grade diffuse calcified left SFA stenosis as well as a long segment of calcified right SFA stenosis.  Initially the plan was for medical therapy but he continued to have claudication.  In November 2018 he underwent successful left SFA diamondback atherectomy by Dr. Allyson Sabal.  Doppler studies in December 2019 showed no significant significant change from his previous study, no evidence of significant focal stenosis on the left.  Other medical issues include hypertension and dyslipidemia.  He has statin intolerant.  His LDL in August 2019 was 135.  He has been approved for Repatha therapy.   Current Outpatient Medications  Medication Sig Dispense Refill  . aspirin 81 MG tablet Take 81 mg by mouth at bedtime.     . clopidogrel (PLAVIX) 75 MG tablet TAKE 1 TABLET (75 MG TOTAL) BY MOUTH DAILY WITH BREAKFAST. 90 tablet 2  . Evolocumab (REPATHA SURECLICK) 140 MG/ML SOAJ Inject 140 mg into the skin every 14 (fourteen) days. 2 pen 12  . metoprolol (LOPRESSOR) 50 MG tablet Take 50 mg by mouth at bedtime.    . Omega-3 Fatty Acids (FISH OIL) 1200 MG CAPS Take 2 capsules (2,400 mg total) by mouth every morning.    . Vitamin D, Ergocalciferol, (DRISDOL) 50000 UNITS CAPS capsule Take 50,000 Units by mouth every Sunday.      No current facility-administered medications for this visit.     Allergies  Allergen Reactions  . Crestor [Rosuvastatin] Other (See Comments)    Myalgias  . Lisinopril     cough    Past Medical History:  Diagnosis Date  . Claudication (HCC) 06/27/2011    Left ABIs abnormal values with mild-moderate arterial insufficiency, right SFA moderate amount of mixed density plaque elevating velocities suggestive 50-69% diameter reduction  . Coronary artery disease 06/27/2011   Normal Lexiscan, EF 64, post-stress EF 64, no EKG changes  . Diabetes mellitus without complication (HCC)    prediabetic  . Hyperlipidemia   . Hypertension   . Impotence of organic origin   . Osteoarthritis   . Other testicular hypofunction   . PVD (peripheral vascular disease) (HCC)     Social History   Socioeconomic History  . Marital status: Married    Spouse name: cathy  . Number of children: 1  . Years of education: 80  . Highest education level: Not on file  Occupational History  . Occupation: retired  Engineer, production  . Financial resource strain: Not on file  . Food insecurity:    Worry: Not on file    Inability: Not on file  . Transportation needs:    Medical: Not on file    Non-medical: Not on file  Tobacco Use  . Smoking status: Former Smoker    Packs/day: 1.00    Years: 50.00    Pack years: 50.00    Types: Cigarettes    Last attempt to quit: 12/17/2004    Years since quitting: 13.5  . Smokeless tobacco: Never Used  Substance and Sexual Activity  . Alcohol use: Yes  Comment: occasionally  . Drug use: No  . Sexual activity: Not on file  Lifestyle  . Physical activity:    Days per week: Not on file    Minutes per session: Not on file  . Stress: Not on file  Relationships  . Social connections:    Talks on phone: Not on file    Gets together: Not on file    Attends religious service: Not on file    Active member of club or organization: Not on file    Attends meetings of clubs or organizations: Not on file    Relationship status: Not on file  . Intimate partner violence:    Fear of current or ex partner: Not on file    Emotionally abused: Not on file    Physically abused: Not on file    Forced sexual activity: Not on file  Other Topics  Concern  . Not on file  Social History Narrative   Lives with wife   Caffeine - coffee, 1 cup daily     Family History  Problem Relation Age of Onset  . Hypertension Mother        [id number]  . Clotting disorder Father        [id number]  . Diabetes Brother        heart surgery     Review of Systems: General: negative for chills, fever, night sweats or weight changes.  Cardiovascular: negative for chest pain, dyspnea on exertion, edema, orthopnea, palpitations, paroxysmal nocturnal dyspnea or shortness of breath Dermatological: negative for rash Respiratory: negative for cough or wheezing Urologic: negative for hematuria Abdominal: negative for nausea, vomiting, diarrhea, bright red blood per rectum, melena, or hematemesis Neurologic: negative for visual changes, syncope, or dizziness All other systems reviewed and are otherwise negative except as noted above.    Blood pressure (!) 162/70, pulse 67, height 6' (1.829 m), weight 218 lb (98.9 kg), SpO2 95 %.  General appearance: alert, cooperative and no distress Neck: no carotid bruit and no JVD Lungs: decreased breath sounds, few rhonchi Heart: regular rate and rhythm Extremities: no edema Pulses: diminneished LEA pulses, he does have hair growth oin both feet Skin: warm and dry Neurologic: Grossly normal  EKG NSR, 63  ASSESSMENT AND PLAN:   Peripheral vascular disease (HCC) Bilateral LEA disease, occluded RSFA, 95% LSFA with 2V runoff by angiogram March 2018 [facility]  Hx of CABG CABG x 3 2006-LIMA-LAD, SVG-OM, SVG-PDA Myoview low risk 2013  Dyslipidemia Statin intolerant-approved for Repatha  HTN (hypertension) Essential hypertension-repeat B/P by me 152/76  Spinal stenosis, lumbar 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 He has not started Repatha yet-? cost.   Corine ShelterLuke  Brandalynn Ofallon PA-C 06/22/2018 8:02 AM

## 2018-06-22 NOTE — Patient Instructions (Addendum)
Medication Instructions:  Your physician recommends that you continue on your current medications as directed. Please refer to the Current Medication list given to you today. If you need a refill on your cardiac medications before your next appointment, please call your pharmacy.   Lab work: Your physician recommends that you return for lab work in: TODAY-LIPID If you have labs (blood work) drawn today and your tests are completely normal, you will receive your results only by: Marland Kitchen MyChart Message (if you have MyChart) OR . A paper copy in the mail If you have any lab test that is abnormal or we need to change your treatment, we will call you to review the results.  Testing/Procedures: None   Follow-Up: At [facility], you and your health needs are our priority.  As part of our continuing mission to provide you with exceptional heart care, we have created designated Provider Care Teams.  These Care Teams include your primary Cardiologist (physician) and Advanced Practice Providers (APPs -  Physician Assistants and Nurse Practitioners) who all work together to provide you with the care you need, when you need it. . Your physician recommends that you schedule a follow-up appointment in: 1 month with Corine Shelter, PA-C  Any Other Special Instructions Will Be Listed Below (If Applicable). CHECK YOUR BLOOD PRESSURE 3-4 TIMES A WEEK AT DIFFERENT TIMES OF THE DAY AND BRING YOUR READINGS TO YOUR NEXT VISIT

## 2018-06-22 NOTE — Assessment & Plan Note (Signed)
Essential hypertension 

## 2018-06-22 NOTE — Assessment & Plan Note (Signed)
CABG x 3 2006-LIMA-LAD, SVG-OM, SVG-PDA Myoview low risk 2013

## 2018-06-22 NOTE — Assessment & Plan Note (Signed)
Bilateral LEA disease, occluded RSFA, 95% LSFA with 2V runoff by angiogram March 2018 [facility]

## 2018-06-22 NOTE — Assessment & Plan Note (Signed)
L2-L5 by MRI March 2018

## 2018-06-25 ENCOUNTER — Telehealth: Payer: Self-pay

## 2018-06-25 NOTE — Telephone Encounter (Signed)
Spoke with patient this morning about his lab results and he wanted to know the status of his Repatha. Advised patient that I would send the pharmacist a message and they would give him a call.

## 2018-06-25 NOTE — Telephone Encounter (Signed)
Spoke with patient, his PA expired on 05/18/18.  Will reapply thru [employer] and then send rx to [facility] for pricing.  Patient aware.

## 2018-07-06 ENCOUNTER — Ambulatory Visit: Payer: Medicare HMO | Admitting: Cardiology

## 2018-07-13 ENCOUNTER — Telehealth: Payer: Self-pay

## 2018-07-13 MED ORDER — EVOLOCUMAB 140 MG/ML ~~LOC~~ SOAJ: 140.0000 mg | SUBCUTANEOUS | 12 refills | Status: DC

## 2018-07-13 NOTE — Telephone Encounter (Signed)
Called to let the pt know that the repatha pa is approved until December 31 and sent [facility] to [facility] [address] and to call back with questions, left a detailed msg

## 2018-07-20 ENCOUNTER — Ambulatory Visit: Payer: Medicare HMO | Admitting: Cardiology

## 2018-07-22 ENCOUNTER — Ambulatory Visit: Payer: Medicare HMO | Admitting: Cardiology

## 2018-07-22 ENCOUNTER — Encounter: Payer: Self-pay | Admitting: Cardiology

## 2018-07-22 VITALS — BP 126/66 | HR 74 | Ht 72.0 in | Wt 216.8 lb

## 2018-07-22 DIAGNOSIS — I739 Peripheral vascular disease, unspecified: Secondary | ICD-10-CM | POA: Diagnosis not present

## 2018-07-22 DIAGNOSIS — M48061 Spinal stenosis, lumbar region without neurogenic claudication: Secondary | ICD-10-CM | POA: Diagnosis not present

## 2018-07-22 DIAGNOSIS — E785 Hyperlipidemia, unspecified: Secondary | ICD-10-CM

## 2018-07-22 DIAGNOSIS — Z951 Presence of aortocoronary bypass graft: Secondary | ICD-10-CM

## 2018-07-22 DIAGNOSIS — I10 Essential (primary) hypertension: Secondary | ICD-10-CM

## 2018-07-22 MED ORDER — AMLODIPINE BESYLATE 5 MG PO TABS: 5.0000 mg | ORAL_TABLET | Freq: Every day | ORAL | 6 refills | Status: DC

## 2018-07-22 MED ORDER — CO-Q 10 OMEGA-3 FISH OIL PO CAPS: ORAL_CAPSULE | ORAL | 0 refills | Status: DC

## 2018-07-22 MED ORDER — ROSUVASTATIN CALCIUM 5 MG PO TABS: ORAL_TABLET | ORAL | 2 refills | Status: DC

## 2018-07-22 NOTE — Patient Instructions (Addendum)
Medication Instructions:  START Crestor 5mg  Take 1 tablet Tuesdays and Fridays START CoQ10 Take 1 tablet once a day This can be purchased over the counter START Norvasc 5mg  Take 1 tablet once a day  If you need a refill on your cardiac medications before your next appointment, please call your pharmacy.   Lab work: None  If you have labs (blood work) drawn today and your tests are completely normal, you will receive your results only by: Marland Kitchen MyChart Message (if you have MyChart) OR . A paper copy in the mail If you have any lab test that is abnormal or we need to change your treatment, we will call you to review the results.  Testing/Procedures: None   Follow-Up: At [facility], you and your health needs are our priority.  As part of our continuing mission to provide you with exceptional heart care, we have created designated Provider Care Teams.  These Care Teams include your primary Cardiologist (physician) and Advanced Practice Providers (APPs -  Physician Assistants and Nurse Practitioners) who all work together to provide you with the care you need, when you need it. You will need a follow up appointment in 5 months.  Please call our office 2 months (May/June) in advance to schedule this appointment.  You may see Dr Nanetta Batty or one of the following Advanced Practice Providers on your designated Care Team:   Corine Shelter, PA-C Judy Pimple, [place] . Marjie Skiff, PA-C  Any Other Special Instructions Will Be Listed Below (If Applicable).

## 2018-07-22 NOTE — Progress Notes (Signed)
07/22/2018 [name]   Jan 24, 1941  [id number]  Primary Physician [name], MD Primary Cardiologist: Dr Allyson Sabal  HPI:  Mr. Eltzroth is a pleasant 78 year old male with a history of coronary disease and vascular disease.  He had CABG in 2006 with an LIMA to his LAD, and SVG to his CFX marginal branch, and an SVG to the PDA.  Myoview in 2013 was low risk.  He developed claudication and in March 2018 had PV angiogram which revealed revealed high-grade diffuse calcified left SFA stenosis as well as a long segment of calcified right SFA stenosis.  Initially the plan was for medical therapy but he continued to have claudication.  In November 2018 he underwent successful left SFA diamondback atherectomy by Dr. Allyson Sabal.  Doppler studies in December 2019 showed no significant significant change from his previous study, no evidence of significant focal stenosis on the left.  Other medical issues include hypertension and dyslipidemia.  He has been statin intolerant.  He has been approved for Repatha therapy but can't afford the co pay.   I saw him in the office 06/22/2018 for a routine follow up.  He has done well from a cardiac standpoint, he is active and does lots of work outside.  His B/P was elevated.  I asked him to track his B/P and return for a follow up. I also ordered a lipid panel.  Since he was seen in Feb he found out his co pay was going to be 300.00 for the Repatha and he says he can't afford that. His B/P has been running 130-150 systolic.    Current Outpatient Medications  Medication Sig Dispense Refill  . aspirin 81 MG tablet Take 81 mg by mouth at bedtime.     . clopidogrel (PLAVIX) 75 MG tablet TAKE 1 TABLET (75 MG TOTAL) BY MOUTH DAILY WITH BREAKFAST. 90 tablet 2  . Evolocumab (REPATHA SURECLICK) 140 MG/ML SOAJ Inject 140 mg into the skin every 14 (fourteen) days. 2 pen 12  . metoprolol (LOPRESSOR) 50 MG tablet Take 50 mg by mouth at bedtime.    . Omega-3 Fatty Acids (FISH OIL) 1200 MG  CAPS Take 2 capsules (2,400 mg total) by mouth every morning.    . Vitamin D, Ergocalciferol, (DRISDOL) 50000 UNITS CAPS capsule Take 50,000 Units by mouth every Sunday.      No current facility-administered medications for this visit.     Allergies  Allergen Reactions  . Crestor [Rosuvastatin] Other (See Comments)    Myalgias  . Lisinopril     cough    Past Medical History:  Diagnosis Date  . Claudication (HCC) 06/27/2011   Left ABIs abnormal values with mild-moderate arterial insufficiency, right SFA moderate amount of mixed density plaque elevating velocities suggestive 50-69% diameter reduction  . Coronary artery disease 06/27/2011   Normal Lexiscan, EF 64, post-stress EF 64, no EKG changes  . Diabetes mellitus without complication (HCC)    prediabetic  . Hyperlipidemia   . Hypertension   . Impotence of organic origin   . Osteoarthritis   . Other testicular hypofunction   . PVD (peripheral vascular disease) (HCC)     Social History   Socioeconomic History  . Marital status: Married    Spouse name: cathy  . Number of children: 1  . Years of education: 13  . Highest education level: Not on file  Occupational History  . Occupation: retired  Engineer, production  . Financial resource strain: Not on file  . Food insecurity:  Worry: Not on file    Inability: Not on file  . Transportation needs:    Medical: Not on file    Non-medical: Not on file  Tobacco Use  . Smoking status: Former Smoker    Packs/day: 1.00    Years: 50.00    Pack years: 50.00    Types: Cigarettes    Last attempt to quit: 12/17/2004    Years since quitting: 13.6  . Smokeless tobacco: Never Used  Substance and Sexual Activity  . Alcohol use: Yes    Comment: occasionally  . Drug use: No  . Sexual activity: Not on file  Lifestyle  . Physical activity:    Days per week: Not on file    Minutes per session: Not on file  . Stress: Not on file  Relationships  . Social connections:    Talks on phone:  Not on file    Gets together: Not on file    Attends religious service: Not on file    Active member of club or organization: Not on file    Attends meetings of clubs or organizations: Not on file    Relationship status: Not on file  . Intimate partner violence:    Fear of current or ex partner: Not on file    Emotionally abused: Not on file    Physically abused: Not on file    Forced sexual activity: Not on file  Other Topics Concern  . Not on file  Social History Narrative   Lives with wife   Caffeine - coffee, 1 cup daily     Family History  Problem Relation Age of Onset  . Hypertension Mother        52  . Clotting disorder Father        31  . Diabetes Brother        heart surgery     Review of Systems: General: negative for chills, fever, night sweats or weight changes.  Cardiovascular: negative for chest pain, dyspnea on exertion, edema, orthopnea, palpitations, paroxysmal nocturnal dyspnea or shortness of breath Dermatological: negative for rash Respiratory: negative for cough or wheezing Urologic: negative for hematuria Abdominal: negative for nausea, diarrhea, bright red blood per rectum, melena, or hematemesis N&V yesterday after he ate shrimp for lunch- better today Neurologic: negative for visual changes, syncope, or dizziness All other systems reviewed and are otherwise negative except as noted above.    Blood pressure 126/66, pulse 74, height 6' (1.829 m), weight 216 lb 12.8 oz (98.3 kg).  General appearance: alert, cooperative and no distress Neck: no JVD Extremities: no edema Skin: Skin color, texture, turgor normal. No rashes or lesions Neurologic: Grossly normal   ASSESSMENT AND PLAN:    HTN (hypertension) Essential hypertension-repeat B/P by me 138/74  Peripheral vascular disease (HCC) Bilateral LEA disease, occluded RSFA, 95% LSFA with 2V runoff by angiogram March 2018 Successful LSFA [facility] atherectomy Nov 2018 for  claudication  Hx of CABG CABG x 3 2006-LIMA-LAD, SVG-OM, SVG-PDA Myoview low risk 2013  Dyslipidemia Statin intolerant-approved for Repatha but co pay too high  Spinal stenosis, lumbar L2-L5 by MRI March 2018- he had two days of back pain after using a chain saw last week.   PLAN Mr. Mccaskey tells me that last month when he was seen he remembered that he was taking an over-the-counter cold medication when we saw him.  His blood pressure has been better since he finished that.  It still elevated and I suggested we add amlodipine  5 mg daily.  He has had a cough associated with an ACE inhibitor in the past.  I suggested we try Crestor 5 mg twice a week.  His recent LDL was 95.  I also suggested he take co-Q10 daily.  He will have a follow-up lipid panel when he sees Dr. Allyson Sabal in August.  Corine Shelter PA-C 07/22/2018 9:03 AM

## 2018-09-28 ENCOUNTER — Other Ambulatory Visit (HOSPITAL_COMMUNITY): Payer: Self-pay | Admitting: Cardiovascular Disease

## 2018-09-28 DIAGNOSIS — I739 Peripheral vascular disease, unspecified: Secondary | ICD-10-CM

## 2018-10-28 ENCOUNTER — Telehealth: Payer: Self-pay

## 2018-10-28 NOTE — Telephone Encounter (Signed)
Called pt to notify them that [employer] is not covering the medication to receive it free from the manufacturer and to apply w/ the [employer] gave them the number and the website and instructed the pt that they may apply by phone or thru the site

## 2019-01-03 ENCOUNTER — Other Ambulatory Visit: Payer: Self-pay | Admitting: Cardiology

## 2019-01-14 ENCOUNTER — Other Ambulatory Visit: Payer: Self-pay

## 2019-01-14 ENCOUNTER — Ambulatory Visit: Payer: Medicare HMO | Admitting: Cardiovascular Disease

## 2019-01-14 ENCOUNTER — Encounter: Payer: Self-pay | Admitting: Cardiovascular Disease

## 2019-01-14 DIAGNOSIS — I10 Essential (primary) hypertension: Secondary | ICD-10-CM

## 2019-01-14 DIAGNOSIS — I739 Peripheral vascular disease, unspecified: Secondary | ICD-10-CM

## 2019-01-14 DIAGNOSIS — Z951 Presence of aortocoronary bypass graft: Secondary | ICD-10-CM | POA: Diagnosis not present

## 2019-01-14 DIAGNOSIS — E785 Hyperlipidemia, unspecified: Secondary | ICD-10-CM | POA: Diagnosis not present

## 2019-01-14 NOTE — Patient Instructions (Addendum)
Medication Instructions:  Your physician recommends that you continue on your current medications as directed. Please refer to the Current Medication list given to you today.  If you need a refill on your cardiac medications before your next appointment, please call your pharmacy.   Lab work: Your physician recommends that you return for lab work within 1-2 weeks: lipid and liver panels  If you have labs (blood work) drawn today and your tests are completely normal, you will receive your results only by: Marland Kitchen MyChart Message (if you have MyChart) OR . A paper copy in the mail If you have any lab test that is abnormal or we need to change your treatment, we will call you to review the results.  Testing/Procedures: Your physician has requested that you have a lower or upper extremity arterial duplex. This test is an ultrasound of the arteries in the legs or arms. It looks at arterial blood flow in the legs and arms. Allow one hour for Lower and Upper Arterial scans. There are no restrictions or special instructions TO BE SCHEDULED FOR December 2021  Your physician has requested that you have an ankle brachial index (ABI). During this test an ultrasound and blood pressure cuff are used to evaluate the arteries that supply the arms and legs with blood. Allow thirty minutes for this exam. There are no restrictions or special instructions. TO BE SCHEDULED FOR December 2021  Follow-Up: At [facility], you and your health needs are our priority.  As part of our continuing mission to provide you with exceptional heart care, we have created designated Provider Care Teams.  These Care Teams include your primary Cardiologist (physician) and Advanced Practice Providers (APPs -  Physician Assistants and Nurse Practitioners) who all work together to provide you with the care you need, when you need it. . You will need a follow up appointment in 6 months with Kerin Ransom, PA-C and in 12 months with Dr.  Quay Burow.  Please call our office 2 months in advance to schedule each appointment.

## 2019-01-14 NOTE — Progress Notes (Signed)
01/14/2019 [name]   Sep 13, 1940  [id number]  Primary Physician Tamsen Roers, MD Primary Cardiologist: Lorretta Harp MD FACP, [place], [place], [place]  HPI:  [name] is a 79 y.o.  mildly overweight married Caucasian male whoI last saw him in the office  01/12/2018. 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 Lilly Cove December 27, 2004, for left main 3-vessel disease. He had a LIMA to his LAD, a vein to his circumflex marginal branch as well as to the PDA. His other problems include hypertension and hyperlipidemia. He denies chest pain or shortness of breath. He does have left ABI in the 0.85range with a high-frequency signal in his left SFA by duplex ultrasound 10/20/12 but did not claudication at that time.His last Myoview performed 06/27/11 was nonischemic.4 years ago he developed lifestyle limiting bilateral lower extremity claudication. Should be noted that he did stop smoking at the time of his bypass surgery in 2006. He underwent peripheral angiography by myself 08/04/16 revealing a long segment calcified occlusion right SFA and high-grade diffuse calcified left SFA stenosis. I did not think that he could be percutaneously revascularized on the right,and therefore opted for medical therapy.This Pletal has been titrated to 100 mg by mouth twice a day without significant improvement in his claudication symptoms. These are lifestyle limiting. He denies chest pain or shortness of breath. I performed peripheral angiography and endovascular therapy of his diffusely calcified and highly obstructive left SFA using diamondback orbital rotational atherectomy followed by drug-eluting balloon plasty. This left ABI increased from 0.63-.85 and his claudication has significantly improved and were resolved.   Since I saw him a year ago he is done well.  His Dopplers in December  revealed a left ABI 0.84 with a patent left SFA.  Does have spinal stenosis and has back and leg pain which is somewhat lifestyle limiting.  He denies chest pain or shortness of breath.  He is somewhat statin intolerant although is a recently able to take Crestor 3 times a week..   Current Meds  Medication Sig  . amLODipine (NORVASC) 5 MG tablet TAKE 1 TABLET BY MOUTH EVERY DAY  . aspirin 81 MG tablet Take 81 mg by mouth at bedtime.   . clopidogrel (PLAVIX) 75 MG tablet TAKE 1 TABLET (75 MG TOTAL) BY MOUTH DAILY WITH BREAKFAST.  Marland Kitchen Coenzyme Q10-Fish Oil-Vit E (CO-Q 10 OMEGA-3 FISH OIL) CAPS Take 1 tablet once a day  . Evolocumab (REPATHA SURECLICK) 536 MG/ML SOAJ Inject 140 mg into the skin every 14 (fourteen) days.  . metoprolol (LOPRESSOR) 50 MG tablet Take 50 mg by mouth at bedtime.  . Omega-3 Fatty Acids (FISH OIL) 1200 MG CAPS Take 2 capsules (2,400 mg total) by mouth every morning.  . rosuvastatin (CRESTOR) 5 MG tablet Take 1 tablet on Tuesdays and Fridays ONLY  . Vitamin D, Ergocalciferol, (DRISDOL) 50000 UNITS CAPS capsule Take 50,000 Units by mouth every Sunday.      Allergies  Allergen Reactions  . Crestor [Rosuvastatin] Other (See Comments)    Myalgias  . Lisinopril     cough    Social History   Socioeconomic History  . Marital status: Married    Spouse name: cathy  . Number of children: 1  . Years of education: 44  . Highest education level: Not on file  Occupational History  . Occupation: retired  Social Needs  . Financial resource strain: Not on file  . Food insecurity    Worry: Not on file    Inability: Not on file  . Transportation needs    Medical: Not on file    Non-medical: Not on file  Tobacco Use  . Smoking status: Former Smoker    Packs/day: 1.00    Years: 50.00    Pack years: 50.00    Types: Cigarettes    Quit date: 12/17/2004    Years since quitting: 14.0  . Smokeless tobacco: Never Used  Substance and Sexual Activity  . Alcohol use: Yes     Comment: occasionally  . Drug use: No  . Sexual activity: Not on file  Lifestyle  . Physical activity    Days per week: Not on file    Minutes per session: Not on file  . Stress: Not on file  Relationships  . Social Musician on phone: Not on file    Gets together: Not on file    Attends religious service: Not on file    Active member of club or organization: Not on file    Attends meetings of clubs or organizations: Not on file    Relationship status: Not on file  . Intimate partner violence    Fear of current or ex partner: Not on file    Emotionally abused: Not on file    Physically abused: Not on file    Forced sexual activity: Not on file  Other Topics Concern  . Not on file  Social History Narrative   Lives with wife   Caffeine - coffee, 1 cup daily     Review of Systems: General: negative for chills, fever, night sweats or weight changes.  Cardiovascular: negative for chest pain, dyspnea on exertion, edema, orthopnea, palpitations, paroxysmal nocturnal dyspnea or shortness of breath Dermatological: negative for rash Respiratory: negative for cough or wheezing Urologic: negative for hematuria Abdominal: negative for nausea, vomiting, diarrhea, bright red blood per rectum, melena, or hematemesis Neurologic: negative for visual changes, syncope, or dizziness All other systems reviewed and are otherwise negative except as noted above.    Blood pressure 124/70, pulse 80, temperature 99 F (37.2 C), height 6' (1.829 m), weight 215 lb (97.5 kg).  General appearance: alert and no distress Neck: no adenopathy, no carotid bruit, no JVD, supple, symmetrical, trachea midline and thyroid not enlarged, symmetric, no tenderness/mass/nodules Lungs: clear to auscultation bilaterally Heart: regular rate and rhythm, S1, S2 normal, no murmur, click, rub or gallop Extremities: extremities normal, atraumatic, no cyanosis or edema Pulses: 2+ and symmetric Skin: Skin color,  texture, turgor normal. No rashes or lesions Neurologic: Alert and oriented X 3, normal strength and tone. Normal symmetric reflexes. Normal coordination and gait  EKG normal sinus rhythm at 80 without ST or T wave changes.  I personally reviewed this EKG.  ASSESSMENT AND PLAN:   Hx of CABG History of CAD status post CABG in 2006 with a LIMA to his LAD, vein to the circumflex marginal branch and to the PDA.  His last Myoview was in 2013 and was low risk.  He denies chest pain or shortness of breath.  HTN (hypertension) History of essential hypertension with blood pressure measured today at 124/70.  He is on Norvasc and metoprolol  Dyslipidemia History of hyperlipidemia somewhat statin intolerant, improved for Repatha but unable to afford this.  He is back on Crestor 3 times a week and is tolerating this.  We will recheck a lipid liver profile.  Peripheral vascular disease (HCC) History of peripheral arterial disease status post peripheral angiography by myself 08/04/2016 revealing a long segment calcified occlusion of the right SFA which did not I did not think was percutaneously addressable as well as long subtotally occluded calcified left SFA which I revascularized using orbital rotational atherectomy, PTA 04/13/2017 resulting in improvement in his claudication on that side.  His most recent Dopplers performed 04/26/2018 revealed a left ABI 0.84 with a patent left SFA.  He does have back issues/spinal stenosis potentially contributing to his back and leg pain.      Runell GessJonathan J. Berry MD FACP,FACC,FAHA, [facility] 01/14/2019 4:55 PM

## 2019-01-14 NOTE — Assessment & Plan Note (Signed)
History of hyperlipidemia somewhat statin intolerant, improved for Repatha but unable to afford this.  He is back on Crestor 3 times a week and is tolerating this.  We will recheck a lipid liver profile.

## 2019-01-14 NOTE — Assessment & Plan Note (Signed)
History of essential hypertension with blood pressure measured today at 124/70.  He is on Norvasc and metoprolol

## 2019-01-14 NOTE — Assessment & Plan Note (Signed)
History of peripheral arterial disease status post peripheral angiography by myself 08/04/2016 revealing a long segment calcified occlusion of the right SFA which did not I did not think was percutaneously addressable as well as long subtotally occluded calcified left SFA which I revascularized using orbital rotational atherectomy, PTA 04/13/2017 resulting in improvement in his claudication on that side.  His most recent Dopplers performed 04/26/2018 revealed a left ABI 0.84 with a patent left SFA.  He does have back issues/spinal stenosis potentially contributing to his back and leg pain.

## 2019-01-14 NOTE — Assessment & Plan Note (Signed)
History of CAD status post CABG in 2006 with a LIMA to his LAD, vein to the circumflex marginal branch and to the PDA.  His last Myoview was in 2013 and was low risk.  He denies chest pain or shortness of breath.

## 2019-01-18 LAB — HEPATIC FUNCTION PANEL
ALT: 14 IU/L (ref 0–44)
AST: 15 IU/L (ref 0–40)
Albumin: 4.2 g/dL (ref 3.7–4.7)
Alkaline Phosphatase: 79 IU/L (ref 39–117)
Bilirubin Total: 0.7 mg/dL (ref 0.0–1.2)
Bilirubin, Direct: 0.21 mg/dL (ref 0.00–0.40)
Total Protein: 6.8 g/dL (ref 6.0–8.5)

## 2019-01-18 LAB — LIPID PANEL
Chol/HDL Ratio: 3.5 ratio (ref 0.0–5.0)
Cholesterol, Total: 138 mg/dL (ref 100–199)
HDL: 39 mg/dL — ABNORMAL LOW (ref >39)
LDL Chol Calc (NIH): 84 mg/dL (ref 0–99)
Triglycerides: 74 mg/dL (ref 0–149)
VLDL Cholesterol Cal: 15 mg/dL (ref 5–40)

## 2019-01-19 ENCOUNTER — Encounter: Payer: Self-pay | Admitting: *Deleted

## 2019-02-13 ENCOUNTER — Other Ambulatory Visit: Payer: Self-pay | Admitting: Cardiology

## 2019-03-02 ENCOUNTER — Other Ambulatory Visit: Payer: Self-pay | Admitting: Cardiovascular Disease

## 2019-04-27 ENCOUNTER — Other Ambulatory Visit: Payer: Self-pay

## 2019-04-27 ENCOUNTER — Ambulatory Visit (HOSPITAL_COMMUNITY)
Admission: RE | Admit: 2019-04-27 | Discharge: 2019-04-27 | Disposition: A | Discharge status: Home / Self Care | Payer: Medicare HMO | Source: Ambulatory Visit | Attending: Cardiology | Admitting: Cardiology

## 2019-04-27 ENCOUNTER — Other Ambulatory Visit (HOSPITAL_COMMUNITY): Payer: Self-pay | Admitting: Cardiovascular Disease

## 2019-04-27 DIAGNOSIS — I739 Peripheral vascular disease, unspecified: Secondary | ICD-10-CM

## 2019-05-06 ENCOUNTER — Ambulatory Visit (INDEPENDENT_AMBULATORY_CARE_PROVIDER_SITE_OTHER): Payer: Medicare HMO | Admitting: Cardiovascular Disease

## 2019-05-06 ENCOUNTER — Other Ambulatory Visit: Payer: Self-pay

## 2019-05-06 ENCOUNTER — Encounter: Payer: Self-pay | Admitting: Cardiovascular Disease

## 2019-05-06 VITALS — BP 130/76 | HR 59 | Ht 72.0 in | Wt 222.2 lb

## 2019-05-06 DIAGNOSIS — I739 Peripheral vascular disease, unspecified: Secondary | ICD-10-CM

## 2019-05-06 DIAGNOSIS — Z951 Presence of aortocoronary bypass graft: Secondary | ICD-10-CM | POA: Diagnosis not present

## 2019-05-06 DIAGNOSIS — I10 Essential (primary) hypertension: Secondary | ICD-10-CM

## 2019-05-06 DIAGNOSIS — E785 Hyperlipidemia, unspecified: Secondary | ICD-10-CM | POA: Diagnosis not present

## 2019-05-06 NOTE — Assessment & Plan Note (Signed)
History of dyslipidemia on Crestor 3 times a week although he is statin intolerant and takes it sporadically.  We got him authorized for Repatha although he never filled the prescription because of financial constraints.  His most recent lipid profile performed 01/18/2019 revealed total cholesterol 138, LDL of 95 and HDL 39.

## 2019-05-06 NOTE — Progress Notes (Signed)
05/06/2019 [name]   25-Sep-1940  [id number]  Primary Physician Tamsen Roers, MD Primary Cardiologist: Lorretta Harp MD FACP, [place], [place], [place]  HPI:  [name] is a 79 y.o.  mildly overweight married Caucasian male whoI last saw him in the office  01/06/2019. 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 Lilly Cove December 27, 2004, for left main 3-vessel disease. He had a LIMA to his LAD, a vein to his circumflex marginal branch as well as to the PDA. His other problems include hypertension and hyperlipidemia. He denies chest pain or shortness of breath. He does have left ABI in the 0.85range with a high-frequency signal in his left SFA by duplex ultrasound 10/20/12 but did not claudication at that time.His last Myoview performed 06/27/11 was nonischemic.4 years ago he developed lifestyle limiting bilateral lower extremity claudication. Should be noted that he did stop smoking at the time of his bypass surgery in 2006. He underwent peripheral angiography by myself 08/04/16 revealing a long segment calcified occlusion right SFA and high-grade diffuse calcified left SFA stenosis. I did not think that he could be percutaneously revascularized on the right,and therefore opted for medical therapy.This Pletal has been titrated to 100 mg by mouth twice a day without significant improvement in his claudication symptoms. These are lifestyle limiting. He denies chest pain or shortness of breath. I performed peripheral angiography and endovascular therapy of his diffusely calcified and highly obstructive left SFA using diamondback orbital rotational atherectomy followed by drug-eluting balloon plasty. This left ABI increased from 0.63-.85 and his claudication has significantly improved and were resolved.   Since I saw him 4 months ago he is remained stable.  His most  recent Dopplers performed 04/26/2021 suggest restenosis of his left SFA although he continues to deny claudication.  Denies chest pain or shortness of breath.  He is statin intolerant and was unable to afford Repatha.  We may pursue Praluent.  No outpatient medications have been marked as taking for the 05/06/19 encounter (Office Visit) with Lorretta Harp, MD.     Allergies  Allergen Reactions  . Crestor [Rosuvastatin] Other (See Comments)    Myalgias  . Lisinopril     cough    Social History   Socioeconomic History  . Marital status: Married    Spouse name: cathy  . Number of children: 1  . Years of education: 54  . Highest education level: Not on file  Occupational History  . Occupation: retired  Tobacco Use  . Smoking status: Former Smoker    Packs/day: 1.00    Years: 50.00    Pack years: 50.00    Types: Cigarettes    Quit date: 12/17/2004    Years since quitting: 14.3  . Smokeless tobacco: Never Used  Substance and Sexual Activity  . Alcohol use: Yes    Comment: occasionally  . Drug use: No  . Sexual activity: Not on file  Other Topics Concern  . Not on file  Social History Narrative   Lives with wife   Caffeine - coffee, 1 cup daily   Social Determinants of Health   Financial Resource Strain:   . Difficulty of Paying Living Expenses: Not on file  Food Insecurity:   . Worried About Charity fundraiser in the Last Year: Not on file  . Ran Out of Food in the  Last Year: Not on file  Transportation Needs:   . Lack of Transportation (Medical): Not on file  . Lack of Transportation (Non-Medical): Not on file  Physical Activity:   . Days of Exercise per Week: Not on file  . Minutes of Exercise per Session: Not on file  Stress:   . Feeling of Stress : Not on file  Social Connections:   . Frequency of Communication with Friends and Family: Not on file  . Frequency of Social Gatherings with Friends and Family: Not on file  . Attends Religious Services: Not on  file  . Active Member of Clubs or Organizations: Not on file  . Attends Banker Meetings: Not on file  . Marital Status: Not on file  Intimate Partner Violence:   . Fear of Current or Ex-Partner: Not on file  . Emotionally Abused: Not on file  . Physically Abused: Not on file  . Sexually Abused: Not on file     Review of Systems: General: negative for chills, fever, night sweats or weight changes.  Cardiovascular: negative for chest pain, dyspnea on exertion, edema, orthopnea, palpitations, paroxysmal nocturnal dyspnea or shortness of breath Dermatological: negative for rash Respiratory: negative for cough or wheezing Urologic: negative for hematuria Abdominal: negative for nausea, vomiting, diarrhea, bright red blood per rectum, melena, or hematemesis Neurologic: negative for visual changes, syncope, or dizziness All other systems reviewed and are otherwise negative except as noted above.    Blood pressure 130/76, pulse (!) 59, height 6' (1.829 m), weight 222 lb 3.2 oz (100.8 kg), SpO2 98 %.  General appearance: alert and no distress Neck: no adenopathy, no JVD, supple, symmetrical, trachea midline, thyroid not enlarged, symmetric, no tenderness/mass/nodules and Left carotid bruit Lungs: clear to auscultation bilaterally Heart: regular rate and rhythm, S1, S2 normal, no murmur, click, rub or gallop Extremities: extremities normal, atraumatic, no cyanosis or edema Pulses: Absent pedal pulses Skin: Skin color, texture, turgor normal. No rashes or lesions Neurologic: Alert and oriented X 3, normal strength and tone. Normal symmetric reflexes. Normal coordination and gait  EKG not performed today  ASSESSMENT AND PLAN:   Hx of CABG History of CAD status post CABG by Dr. Cornelius Moras 12/27/2004 for left main three-vessel disease.  He had a LIMA to his LAD, vein to the circumflex marginal branch as well as the PDA.  Myoview performed 06/27/2011 was nonischemic.  He denies chest pain  or shortness of breath.  HTN (hypertension) History of essential hypertension blood pressure measured today 130/76.  He is on amlodipine, and metoprolol.  Dyslipidemia History of dyslipidemia on Crestor 3 times a week although he is statin intolerant and takes it sporadically.  We got him authorized for Repatha although he never filled the prescription because of financial constraints.  His most recent lipid profile performed 01/18/2019 revealed total cholesterol 138, LDL of 95 and HDL 39.  Peripheral vascular disease (HCC) History of PAD angiography 08/04/2016 revealing a long segment calcified occlusion of the right SFA from the origin down to the adductor canal with two-vessel runoff which I did not think was endovascularly fixable.  He did have high-grade segmental calcified left SFA stenosis and underwent diamondback orbital rotational atherectomy followed by drug-coated balloon angioplasty 04/15/2017 with marked improvement in his left ABI from 0.63 up to 0.85 with improvement in his claudication which has remained durable although his most recent Doppler studies performed 04/27/2019 suggest restenosis.      [name] MD FACP,FACC,FAHA, [facility] 05/06/2019 9:09 AM

## 2019-05-06 NOTE — Assessment & Plan Note (Signed)
History of CAD status post CABG by Dr. Roxy Manns 12/27/2004 for left main three-vessel disease.  He had a LIMA to his LAD, vein to the circumflex marginal branch as well as the PDA.  Myoview performed 06/27/2011 was nonischemic.  He denies chest pain or shortness of breath.

## 2019-05-06 NOTE — Patient Instructions (Addendum)
Medication Instructions:  Your physician recommends that you continue on your current medications as directed. Please refer to the Current Medication list given to you today. *If you need a refill on your cardiac medications before your next appointment, please call your pharmacy*  Lab Work: NONE ordered at this time of appointment   If you have labs (blood work) drawn today and your tests are completely normal, you will receive your results only by: Marland Kitchen MyChart Message (if you have MyChart) OR . A paper copy in the mail If you have any lab test that is abnormal or we need to change your treatment, we will call you to review the results.  Testing/Procedures: Your physician has requested that you have a lower extremity arterial exercise duplex. During this test, exercise and ultrasound are used to evaluate arterial blood flow in the legs. Allow one hour for this exam. There are no restrictions or special instructions.  Please schedule for 12 months   Follow-Up: At [facility], you and your health needs are our priority.  As part of our continuing mission to provide you with exceptional heart care, we have created designated Provider Care Teams.  These Care Teams include your primary Cardiologist (physician) and Advanced Practice Providers (APPs -  Physician Assistants and Nurse Practitioners) who all work together to provide you with the care you need, when you need it.  Your next appointment:   6 month(s) 12 months  The format for your next appointment:   In Person In Person  Provider:   Kerin Ransom, PA-C Sande Rives, PA-C Coletta Memos, [place]  Quay Burow, MD-12 months   Other Instructions  Patient Assistance:  The [employer] foundation offers assistance to help pay for medication copays.  They will cover copays for all cholesterol lowering meds, including statins, fibrates, omega-3 oils, ezetimibe, Repatha, Praluent, Nexletol, Nexlizet.  The cards are usually good for  $2,500 or 12 months, whichever comes first. 1. Go to healthwellfoundation.org 2. Click on "Apply Now" 3. Answer questions as to whom is applying (patient or representative) 4. Your disease fund will be "hypercholesterolemia - Medicare access" 5. They will ask questions about finances and which medications you are taking for cholesterol 6. When you submit, the approval is usually within minutes.  You will need to print the card information from the site 7. You will need to show this information to your pharmacy, they will bill your Medicare Part D plan first -then bill [employer] --for the copay.   You can also call them at [phone], although the hold times can be quite long.   Thank you for choosing [facility]

## 2019-05-06 NOTE — Assessment & Plan Note (Signed)
History of PAD angiography 08/04/2016 revealing a long segment calcified occlusion of the right SFA from the origin down to the adductor canal with two-vessel runoff which I did not think was endovascularly fixable.  He did have high-grade segmental calcified left SFA stenosis and underwent diamondback orbital rotational atherectomy followed by drug-coated balloon angioplasty 04/15/2017 with marked improvement in his left ABI from 0.63 up to 0.85 with improvement in his claudication which has remained durable although his most recent Doppler studies performed 04/27/2019 suggest restenosis.

## 2019-05-06 NOTE — Assessment & Plan Note (Signed)
History of essential hypertension blood pressure measured today 130/76.  He is on amlodipine, and metoprolol.

## 2019-06-22 ENCOUNTER — Telehealth: Payer: Self-pay | Admitting: Cardiovascular Disease

## 2019-06-22 NOTE — Telephone Encounter (Signed)
New message   Pt c/o of Chest Pain: STAT if CP now or developed within 24 hours  1. Are you having CP right now? No   2. Are you experiencing any other symptoms (ex. SOB, nausea, vomiting, sweating)?no   3. How long have you been experiencing CP? Has had pain for about a week   4. Is your CP continuous or coming and going? Coming and going   5. Have you taken Nitroglycerin? No  ?

## 2019-06-22 NOTE — Telephone Encounter (Signed)
LMTCB on home and cell numbers listed

## 2019-06-27 NOTE — Telephone Encounter (Signed)
Lm to call back ./cy 

## 2019-07-05 ENCOUNTER — Encounter: Payer: Self-pay | Admitting: Cardiovascular Disease

## 2019-07-05 ENCOUNTER — Ambulatory Visit: Payer: Medicare HMO | Admitting: Cardiovascular Disease

## 2019-07-05 ENCOUNTER — Other Ambulatory Visit: Payer: Self-pay

## 2019-07-05 VITALS — BP 138/72 | HR 63 | Ht 72.0 in | Wt 229.0 lb

## 2019-07-05 DIAGNOSIS — Z951 Presence of aortocoronary bypass graft: Secondary | ICD-10-CM | POA: Diagnosis not present

## 2019-07-05 DIAGNOSIS — R079 Chest pain, unspecified: Secondary | ICD-10-CM | POA: Diagnosis not present

## 2019-07-05 DIAGNOSIS — I10 Essential (primary) hypertension: Secondary | ICD-10-CM | POA: Diagnosis not present

## 2019-07-05 MED ORDER — ISOSORBIDE MONONITRATE ER 30 MG PO TB24: 15.0000 mg | ORAL_TABLET | Freq: Every day | ORAL | 3 refills | Status: DC

## 2019-07-05 NOTE — Patient Instructions (Addendum)
Medication Instructions:  Start taking 15mg  Imdur Daily  If you need a refill on your cardiac medications before your next appointment, please call your pharmacy.   Lab work: NONE  Testing/Procedures: Your physician has requested that you have a lexiscan myoview. For further information please visit . Please follow instruction sheet, as given.  Follow-Up: At [facility], you and your health needs are our priority.  As part of our continuing mission to provide you with exceptional heart care, we have created designated Provider Care Teams.  These Care Teams include your primary Cardiologist (physician) and Advanced Practice Providers (APPs -  Physician Assistants and Nurse Practitioners) who all work together to provide you with the care you need, when you need it. You may see [facility], MD or one of the following Advanced Practice Providers on your designated Care Team:    Nanetta Batty, PA-C  [place], [place]  [place], Edd Fabian  Your physician wants you to follow-up in: 1 month unless the Lexiscan comes back abnormal. You will hear from the office with your results.

## 2019-07-05 NOTE — Progress Notes (Signed)
07/05/2019 [name]   1941/01/23  [id number]  Primary Physician [name], MD Primary Cardiologist: [name] MD FACP, [place], [place], [place]  HPI:  [name] is a 79 y.o.  mildly overweight married Caucasian male whoI last saw him in the office  05/06/2019. 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 [name] December 27, 2004, for left main 3-vessel disease. He had a LIMA to his LAD, a vein to his circumflex marginal branch as well as to the PDA. His other problems include hypertension and hyperlipidemia. He denies chest pain or shortness of breath. He does have left ABI in the 0.85range with a high-frequency signal in his left SFA by duplex ultrasound 10/20/12 but did not claudication at that time.His last Myoview performed 06/27/11 was nonischemic.4 years ago he developed lifestyle limiting bilateral lower extremity claudication. Should be noted that he did stop smoking at the time of his bypass surgery in 2006. He underwent peripheral angiography by myself 08/04/16 revealing a long segment calcified occlusion right SFA and high-grade diffuse calcified left SFA stenosis. I did not think that he could be percutaneously revascularized on the right,and therefore opted for medical therapy.This Pletal has been titrated to 100 mg by mouth twice a day without significant improvement in his claudication symptoms. These are lifestyle limiting. He denies chest pain or shortness of breath. I performed peripheral angiography and endovascular therapy of his diffusely calcified and highly obstructive left SFA using diamondback orbital rotational atherectomy followed by drug-eluting balloon plasty. This left ABI increased from 0.63-.85 and his claudication has significantly improved and were resolved.    His most recent Dopplers performed 04/27/2019 suggest restenosis  of his left SFA although he continues to deny claudication.  Denies chest pain or shortness of breath.  He is statin intolerant and was recently approved for Repatha.  Since I saw him 2 months ago he has developed some upper extremity and upper chest discomfort with exertion which she had not complained of before.  Current Meds  Medication Sig  . amLODipine (NORVASC) 5 MG tablet TAKE 1 TABLET BY MOUTH EVERY DAY  . aspirin 81 MG tablet Take 81 mg by mouth at bedtime.   . clopidogrel (PLAVIX) 75 MG tablet TAKE 1 TABLET BY MOUTH DAILY WITH BREAKFAST.  Marland Kitchen Coenzyme Q10-Fish Oil-Vit E (CO-Q 10 OMEGA-3 FISH OIL) CAPS Take 1 tablet once a day  . Evolocumab (REPATHA SURECLICK) 140 MG/ML SOAJ Inject 140 mg into the skin every 14 (fourteen) days.  . metoprolol (LOPRESSOR) 50 MG tablet Take 50 mg by mouth at bedtime.  . Omega-3 Fatty Acids (FISH OIL) 1200 MG CAPS Take 2 capsules (2,400 mg total) by mouth every morning.  . rosuvastatin (CRESTOR) 5 MG tablet TAKE 1 TABLET ON TUESDAYS AND FRIDAYS ONLY  . Vitamin D, Ergocalciferol, (DRISDOL) 50000 UNITS CAPS capsule Take 50,000 Units by mouth every Sunday.      Allergies  Allergen Reactions  . Crestor [Rosuvastatin] Other (See Comments)    Myalgias  . Lisinopril     cough    Social History   Socioeconomic History  . Marital status: Married    Spouse name: cathy  . Number of children: 1  . Years of education: 56  . Highest education level: Not on file  Occupational History  . Occupation: retired  Tobacco Use  . Smoking status: Former  Smoker    Packs/day: 1.00    Years: 50.00    Pack years: 50.00    Types: Cigarettes    Quit date: 12/17/2004    Years since quitting: 14.5  . Smokeless tobacco: Never Used  Substance and Sexual Activity  . Alcohol use: Yes    Comment: occasionally  . Drug use: No  . Sexual activity: Not on file  Other Topics Concern  . Not on file  Social History Narrative   Lives with wife   Caffeine - coffee, 1 cup  daily   Social Determinants of Health   Financial Resource Strain:   . Difficulty of Paying Living Expenses: Not on file  Food Insecurity:   . Worried About Charity fundraiser in the Last Year: Not on file  . Ran Out of Food in the Last Year: Not on file  Transportation Needs:   . Lack of Transportation (Medical): Not on file  . Lack of Transportation (Non-Medical): Not on file  Physical Activity:   . Days of Exercise per Week: Not on file  . Minutes of Exercise per Session: Not on file  Stress:   . Feeling of Stress : Not on file  Social Connections:   . Frequency of Communication with Friends and Family: Not on file  . Frequency of Social Gatherings with Friends and Family: Not on file  . Attends Religious Services: Not on file  . Active Member of Clubs or Organizations: Not on file  . Attends Archivist Meetings: Not on file  . Marital Status: Not on file  Intimate Partner Violence:   . Fear of Current or Ex-Partner: Not on file  . Emotionally Abused: Not on file  . Physically Abused: Not on file  . Sexually Abused: Not on file     Review of Systems: General: negative for chills, fever, night sweats or weight changes.  Cardiovascular: negative for chest pain, dyspnea on exertion, edema, orthopnea, palpitations, paroxysmal nocturnal dyspnea or shortness of breath Dermatological: negative for rash Respiratory: negative for cough or wheezing Urologic: negative for hematuria Abdominal: negative for nausea, vomiting, diarrhea, bright red blood per rectum, melena, or hematemesis Neurologic: negative for visual changes, syncope, or dizziness All other systems reviewed and are otherwise negative except as noted above.    Blood pressure 138/72, pulse 63, height 6' (1.829 m), weight 229 lb (103.9 kg).  General appearance: alert and no distress Neck: no adenopathy, no carotid bruit, no JVD, supple, symmetrical, trachea midline and thyroid not enlarged, symmetric, no  tenderness/mass/nodules Lungs: clear to auscultation bilaterally Heart: regular rate and rhythm, S1, S2 normal, no murmur, click, rub or gallop Extremities: extremities normal, atraumatic, no cyanosis or edema Pulses: Diminished pedal pulses Skin: Skin color, texture, turgor normal. No rashes or lesions Neurologic: Alert and oriented X 3, normal strength and tone. Normal symmetric reflexes. Normal coordination and gait  EKG sinus rhythm at 63 without ST or T wave changes.  I personally reviewed this EKG.    ASSESSMENT AND PLAN:   Hx of CABG History of CAD status post CABG x3 in 2006 with a LIMA to his LAD, vein graft to an obtuse marginal branch and PDA.  He did have a low risk Myoview in 2013.  I saw him 2 months ago and he was doing well at that time.  Over the last several weeks has developed some left upper extremity and upper chest discomfort with exertion.  Ongoing to begin him on low-dose Imdur and obtain  a Myoview stress test to further evaluate.      [name] MD FACP,FACC,FAHA, [facility] 07/05/2019 11:50 AM

## 2019-07-05 NOTE — Assessment & Plan Note (Signed)
History of CAD status post CABG x3 in 2006 with a LIMA to his LAD, vein graft to an obtuse marginal branch and PDA.  He did have a low risk Myoview in 2013.  I saw him 2 months ago and he was doing well at that time.  Over the last several weeks has developed some left upper extremity and upper chest discomfort with exertion.  Ongoing to begin him on low-dose Imdur and obtain a Myoview stress test to further evaluate.

## 2019-07-11 ENCOUNTER — Telehealth: Payer: Self-pay | Admitting: Pharmacist Clinician (PhC)/ Clinical Pharmacy Specialist

## 2019-07-11 NOTE — Telephone Encounter (Signed)
New Message  Pt's wife calling regarding the shot for the cholesterol (Repatha) she said they got the grant approval for it and wanted to discuss it with Belenda Cruise.  Please call

## 2019-07-12 ENCOUNTER — Telehealth: Payer: Self-pay

## 2019-07-12 ENCOUNTER — Telehealth (HOSPITAL_COMMUNITY): Payer: Self-pay

## 2019-07-12 ENCOUNTER — Other Ambulatory Visit: Payer: Self-pay | Admitting: Cardiology

## 2019-07-12 MED ORDER — PRALUENT 150 MG/ML ~~LOC~~ SOAJ: 150.0000 mg | SUBCUTANEOUS | 11 refills | Status: DC

## 2019-07-12 NOTE — Telephone Encounter (Signed)
Can you run a PA for him for Praluent?  Spreadsheet says NO PA REQUIRED, but I think he really needs one -   Thanks,

## 2019-07-12 NOTE — Telephone Encounter (Signed)
Pt was approved and this was addressed in another encounter

## 2019-07-12 NOTE — Telephone Encounter (Signed)
Encounter complete. 

## 2019-07-12 NOTE — Telephone Encounter (Signed)
Called and lmomed the pt to start praluent 150mg  instead of repatha. rx sent, instructed the pt to callback if issues arise

## 2019-07-14 ENCOUNTER — Ambulatory Visit (HOSPITAL_COMMUNITY)
Admission: RE | Admit: 2019-07-14 | Discharge: 2019-07-14 | Disposition: A | Discharge status: Home / Self Care | Payer: Medicare HMO | Source: Ambulatory Visit | Attending: Cardiology | Admitting: Cardiology

## 2019-07-14 ENCOUNTER — Other Ambulatory Visit: Payer: Self-pay

## 2019-07-14 DIAGNOSIS — Z951 Presence of aortocoronary bypass graft: Secondary | ICD-10-CM | POA: Insufficient documentation

## 2019-07-14 DIAGNOSIS — R079 Chest pain, unspecified: Secondary | ICD-10-CM | POA: Diagnosis present

## 2019-07-14 DIAGNOSIS — I10 Essential (primary) hypertension: Secondary | ICD-10-CM | POA: Insufficient documentation

## 2019-07-14 LAB — MYOCARDIAL PERFUSION IMAGING
LV dias vol: 117 mL (ref 62–150)
LV sys vol: 53 mL
Peak HR: 77 {beats}/min
Rest HR: 62 {beats}/min
SDS: 0
SRS: 0
SSS: 0
TID: 1.07

## 2019-07-14 MED ORDER — TECHNETIUM TC 99M TETROFOSMIN IV KIT
31.1000 | PACK | Freq: Once | INTRAVENOUS | Status: AC | PRN
  Administered 2019-07-14: 31.1 via INTRAVENOUS
  Filled 2019-07-14: qty 32

## 2019-07-14 MED ORDER — REGADENOSON 0.4 MG/5ML IV SOLN
0.4000 mg | Freq: Once | INTRAVENOUS | Status: AC
  Administered 2019-07-14: 0.4 mg via INTRAVENOUS

## 2019-07-14 MED ORDER — TECHNETIUM TC 99M TETROFOSMIN IV KIT
10.2000 | PACK | Freq: Once | INTRAVENOUS | Status: AC | PRN
  Administered 2019-07-14: 10.2 via INTRAVENOUS
  Filled 2019-07-14: qty 11

## 2019-08-02 ENCOUNTER — Other Ambulatory Visit: Payer: Self-pay

## 2019-08-02 ENCOUNTER — Encounter: Payer: Self-pay | Admitting: Cardiovascular Disease

## 2019-08-02 ENCOUNTER — Ambulatory Visit: Payer: Medicare HMO | Admitting: Cardiovascular Disease

## 2019-08-02 VITALS — BP 128/68 | HR 68 | Ht 73.0 in | Wt 226.6 lb

## 2019-08-02 DIAGNOSIS — Z951 Presence of aortocoronary bypass graft: Secondary | ICD-10-CM

## 2019-08-02 DIAGNOSIS — E785 Hyperlipidemia, unspecified: Secondary | ICD-10-CM | POA: Diagnosis not present

## 2019-08-02 NOTE — Patient Instructions (Signed)
Medication Instructions:  NO CHANGE *If you need a refill on your cardiac medications before your next appointment, please call your pharmacy*   Lab Work: Your physician recommends that you return for lab work in: 2 MONTHS PRIOR TO EATING If you have labs (blood work) drawn today and your tests are completely normal, you will receive your results only by: Marland Kitchen MyChart Message (if you have MyChart) OR . A paper copy in the mail If you have any lab test that is abnormal or we need to change your treatment, we will call you to review the results.  Follow-Up: At [facility], you and your health needs are our priority.  As part of our continuing mission to provide you with exceptional heart care, we have created designated Provider Care Teams.  These Care Teams include your primary Cardiologist (physician) and Advanced Practice Providers (APPs -  Physician Assistants and Nurse Practitioners) who all work together to provide you with the care you need, when you need it.  We recommend signing up for the patient portal called "MyChart".  Sign up information is provided on this After Visit Summary.  MyChart is used to connect with patients for Virtual Visits (Telemedicine).  Patients are able to view lab/test results, encounter notes, upcoming appointments, etc.  Non-urgent messages can be sent to your provider as well.   To learn more about what you can do with MyChart, go to [email or web address].    Your next appointment:   Your physician recommends that you schedule a follow-up appointment in: 3 MONTHS WITH APP  Your physician wants you to follow-up in: ONE YEAR WITH DR San Morelle will receive a reminder letter in the mail two months in advance. If you don't receive a letter, please call our office to schedule the follow-up appointment.

## 2019-08-02 NOTE — Assessment & Plan Note (Signed)
History of CAD status post CABG by Dr. Cornelius Moras 12/27/2004 for left main three-vessel disease with a LIMA to his LAD, vein to the circumflex marginal branch and to the PDA.  He had a Myoview performed 06/27/2011 which was nonischemic.  When I saw him a month ago he was complaining of some upper extremity and chest discomfort with exertion.  We obtained a follow-up Myoview stress test 07/15/2019 which was low risk and nonischemic.  His symptoms are somewhat improved.

## 2019-08-02 NOTE — Assessment & Plan Note (Signed)
History of hyperlipidemia intolerant to statin therapy with recent initiation of Praluent PCSK9 therapy.  We will recheck a fasting lipid liver profile in 2 months.

## 2019-08-02 NOTE — Progress Notes (Signed)
08/02/2019 [name]   Dec 14, 1940  [id number]  Primary Physician Tamsen Roers, MD Primary Cardiologist: Lorretta Harp MD FACP, [place], [place], [place]  HPI:  [name] is a 79 y.o.   mildly overweight married Caucasian male whoI last saw him in the office  07/05/2019. 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 Lilly Cove December 27, 2004, for left main 3-vessel disease. He had a LIMA to his LAD, a vein to his circumflex marginal branch as well as to the PDA. His other problems include hypertension and hyperlipidemia. He denies chest pain or shortness of breath. He does have left ABI in the 0.85range with a high-frequency signal in his left SFA by duplex ultrasound 10/20/12 but did not claudication at that time.His last Myoview performed 06/27/11 was nonischemic.4 years ago he developed lifestyle limiting bilateral lower extremity claudication. Should be noted that he did stop smoking at the time of his bypass surgery in 2006. He underwent peripheral angiography by myself 08/04/16 revealing a long segment calcified occlusion right SFA and high-grade diffuse calcified left SFA stenosis. I did not think that he could be percutaneously revascularized on the right,and therefore opted for medical therapy.This Pletal has been titrated to 100 mg by mouth twice a day without significant improvement in his claudication symptoms. These are lifestyle limiting. He denies chest pain or shortness of breath. I performed peripheral angiography and endovascular therapy of his diffusely calcified and highly obstructive left SFA using diamondback orbital rotational atherectomy followed by drug-eluting balloon plasty. This left ABI increased from 0.63-.85 and his claudication has significantly improved and were resolved.   His most recent Dopplers performed 04/27/2019 suggest restenosis  of his left SFA although he continues to deny claudication. Denies chest pain or shortness of breath. He is statin intolerant and was recently approved for Repatha.  Since I saw him in the office a month ago when he was complaining of some new onset upper arm and chest discomfort we did get a Myoview stress test on 07/14/2019 which was nonischemic.  His symptoms have somewhat improved since that time.    Current Meds  Medication Sig  . Alirocumab (PRALUENT) 150 MG/ML SOAJ Inject 150 mg into the skin every 14 (fourteen) days.  Marland Kitchen amLODipine (NORVASC) 5 MG tablet TAKE 1 TABLET BY MOUTH EVERY DAY  . aspirin 81 MG tablet Take 81 mg by mouth at bedtime.   . clopidogrel (PLAVIX) 75 MG tablet TAKE 1 TABLET BY MOUTH DAILY WITH BREAKFAST.  Marland Kitchen Coenzyme Q10-Fish Oil-Vit E (CO-Q 10 OMEGA-3 FISH OIL) CAPS Take 1 tablet once a day  . isosorbide mononitrate (IMDUR) 30 MG 24 hr tablet Take 0.5 tablets (15 mg total) by mouth daily.  . metoprolol (LOPRESSOR) 50 MG tablet Take 50 mg by mouth at bedtime.  . Omega-3 Fatty Acids (FISH OIL) 1200 MG CAPS Take 2 capsules (2,400 mg total) by mouth every morning.  . rosuvastatin (CRESTOR) 5 MG tablet TAKE 1 TABLET ON TUESDAYS AND FRIDAYS ONLY  . Vitamin D, Ergocalciferol, (DRISDOL) 50000 UNITS CAPS capsule Take 50,000 Units by mouth every Sunday.      Allergies  Allergen Reactions  . Crestor [Rosuvastatin] Other (See Comments)    Myalgias  . Lisinopril     cough    Social History   Socioeconomic History  . Marital status: Married    Spouse name:  cathy  . Number of children: 1  . Years of education: 70  . Highest education level: Not on file  Occupational History  . Occupation: retired  Tobacco Use  . Smoking status: Former Smoker    Packs/day: 1.00    Years: 50.00    Pack years: 50.00    Types: Cigarettes    Quit date: 12/17/2004    Years since quitting: 14.6  . Smokeless tobacco: Never Used  Substance and Sexual Activity  . Alcohol use: Yes     Comment: occasionally  . Drug use: No  . Sexual activity: Not on file  Other Topics Concern  . Not on file  Social History Narrative   Lives with wife   Caffeine - coffee, 1 cup daily   Social Determinants of Health   Financial Resource Strain:   . Difficulty of Paying Living Expenses:   Food Insecurity:   . Worried About Programme researcher, broadcasting/film/video in the Last Year:   . Barista in the Last Year:   Transportation Needs:   . Freight forwarder (Medical):   Marland Kitchen Lack of Transportation (Non-Medical):   Physical Activity:   . Days of Exercise per Week:   . Minutes of Exercise per Session:   Stress:   . Feeling of Stress :   Social Connections:   . Frequency of Communication with Friends and Family:   . Frequency of Social Gatherings with Friends and Family:   . Attends Religious Services:   . Active Member of Clubs or Organizations:   . Attends Banker Meetings:   Marland Kitchen Marital Status:   Intimate Partner Violence:   . Fear of Current or Ex-Partner:   . Emotionally Abused:   Marland Kitchen Physically Abused:   . Sexually Abused:      Review of Systems: General: negative for chills, fever, night sweats or weight changes.  Cardiovascular: negative for chest pain, dyspnea on exertion, edema, orthopnea, palpitations, paroxysmal nocturnal dyspnea or shortness of breath Dermatological: negative for rash Respiratory: negative for cough or wheezing Urologic: negative for hematuria Abdominal: negative for nausea, vomiting, diarrhea, bright red blood per rectum, melena, or hematemesis Neurologic: negative for visual changes, syncope, or dizziness All other systems reviewed and are otherwise negative except as noted above.    Blood pressure 128/68, pulse 68, height 6\' 1"  (1.854 m), weight 226 lb 9.6 oz (102.8 kg), SpO2 94 %.  General appearance: alert and no distress Neck: no adenopathy, no carotid bruit, no JVD, supple, symmetrical, trachea midline and thyroid not enlarged,  symmetric, no tenderness/mass/nodules Lungs: clear to auscultation bilaterally Heart: regular rate and rhythm, S1, S2 normal, no murmur, click, rub or gallop Extremities: extremities normal, atraumatic, no cyanosis or edema Pulses: 2+ and symmetric Skin: Skin color, texture, turgor normal. No rashes or lesions Neurologic: Alert and oriented X 3, normal strength and tone. Normal symmetric reflexes. Normal coordination and gait  EKG not performed today  ASSESSMENT AND PLAN:   Hx of CABG History of CAD status post CABG by Dr. 12/27/2004 for left main three-vessel disease with a LIMA to his LAD, vein to the circumflex marginal branch and to the PDA.  He had a Myoview performed 06/27/2011 which was nonischemic.  When I saw him a month ago he was complaining of some upper extremity and chest discomfort with exertion.  We obtained a follow-up Myoview stress test 07/15/2019 which was low risk and nonischemic.  His symptoms are somewhat improved.  Dyslipidemia History of  hyperlipidemia intolerant to statin therapy with recent initiation of Praluent PCSK9 therapy.  We will recheck a fasting lipid liver profile in 2 months.      [name] MD FACP,FACC,FAHA, [facility] 08/02/2019 8:26 AM

## 2019-09-14 ENCOUNTER — Other Ambulatory Visit: Payer: Self-pay | Admitting: Cardiovascular Disease

## 2019-11-03 ENCOUNTER — Encounter: Payer: Medicare HMO | Admitting: Physician Assistant

## 2019-11-04 NOTE — Progress Notes (Signed)
This encounter was created in error - please disregard.

## 2020-03-30 LAB — HEPATIC FUNCTION PANEL
ALT: 19 IU/L (ref 0–44)
AST: 21 IU/L (ref 0–40)
Albumin: 4.6 g/dL (ref 3.7–4.7)
Alkaline Phosphatase: 96 IU/L (ref 44–121)
Bilirubin Total: 1.1 mg/dL (ref 0.0–1.2)
Bilirubin, Direct: 0.32 mg/dL (ref 0.00–0.40)
Total Protein: 7.2 g/dL (ref 6.0–8.5)

## 2020-03-30 LAB — LIPID PANEL
Chol/HDL Ratio: 1.9 ratio (ref 0.0–5.0)
Cholesterol, Total: 99 mg/dL — ABNORMAL LOW (ref 100–199)
HDL: 51 mg/dL (ref >39)
LDL Chol Calc (NIH): 33 mg/dL (ref 0–99)
Triglycerides: 72 mg/dL (ref 0–149)
VLDL Cholesterol Cal: 15 mg/dL (ref 5–40)

## 2020-04-26 ENCOUNTER — Ambulatory Visit (HOSPITAL_COMMUNITY)
Admission: RE | Admit: 2020-04-26 | Discharge: 2020-04-26 | Disposition: A | Discharge status: Home / Self Care | Payer: Medicare HMO | Source: Ambulatory Visit | Attending: Cardiology | Admitting: Cardiology

## 2020-04-26 ENCOUNTER — Other Ambulatory Visit: Payer: Self-pay

## 2020-04-26 ENCOUNTER — Other Ambulatory Visit (HOSPITAL_COMMUNITY): Payer: Self-pay | Admitting: Cardiovascular Disease

## 2020-04-26 DIAGNOSIS — I739 Peripheral vascular disease, unspecified: Secondary | ICD-10-CM | POA: Diagnosis present

## 2020-04-27 ENCOUNTER — Encounter: Payer: Self-pay | Admitting: Cardiology

## 2020-04-27 ENCOUNTER — Ambulatory Visit: Payer: Medicare HMO | Admitting: Cardiology

## 2020-04-27 VITALS — BP 112/60 | HR 68 | Ht 73.0 in | Wt 221.8 lb

## 2020-04-27 DIAGNOSIS — I10 Essential (primary) hypertension: Secondary | ICD-10-CM | POA: Diagnosis not present

## 2020-04-27 DIAGNOSIS — I739 Peripheral vascular disease, unspecified: Secondary | ICD-10-CM

## 2020-04-27 DIAGNOSIS — R079 Chest pain, unspecified: Secondary | ICD-10-CM | POA: Diagnosis not present

## 2020-04-27 DIAGNOSIS — Z951 Presence of aortocoronary bypass graft: Secondary | ICD-10-CM

## 2020-04-27 DIAGNOSIS — M48061 Spinal stenosis, lumbar region without neurogenic claudication: Secondary | ICD-10-CM

## 2020-04-27 DIAGNOSIS — E785 Hyperlipidemia, unspecified: Secondary | ICD-10-CM | POA: Diagnosis not present

## 2020-04-27 DIAGNOSIS — R0789 Other chest pain: Secondary | ICD-10-CM | POA: Insufficient documentation

## 2020-04-27 MED ORDER — ISOSORBIDE MONONITRATE ER 30 MG PO TB24: 30.0000 mg | ORAL_TABLET | Freq: Every day | ORAL | 3 refills | Status: DC

## 2020-04-27 NOTE — Progress Notes (Signed)
Cardiology Office Note:    Date:  04/27/2020   ID:  [name], DOB [birth year], MRN [id number]  PCP:  [name], MD  Cardiologist:  Nanetta Batty, MD  Electrophysiologist:  None   Referring MD: [name], MD   CC: lt sided chest pain with exertion  History of Present Illness:    [name] is a 79 y.o. male with a hx of CAD and PVD. He had CABG in 2006 with an LIMA to his LAD, and SVG to his CFX marginal branch, and an SVG to the PDA. Myoview in 2013 was low risk. He developed claudication and in March 2018 had PV angiogram which revealed revealed high-grade diffuse calcified left SFA stenosis as well as a long segment of calcified right SFA stenosis. Initially the plan was for medical therapy but he continued to have claudication. In November 2018 he underwent successful left SFA diamondback atherectomy by Dr. Allyson Sabal. Other medical issues include hypertension and dyslipidemia. He has been statin intolerant and has been on Pralulent but has to have financial assistance to afford it.  He saw Dr Allyson Sabal in Feb 2021 and complained of lt sided chest and arm pain.  Low dose Imdur was added which the patient says helped his symptoms. Myoview Feb 2021 was low risk.   He is seen in the office today for routine follow-up.  He is accompanied by his daughter.  The patient did complain of increasing left arm pain and left chest pain.  He takes care of horses and when he carries the feed basket in his left arm he gets discomfort that goes up the left side of his chest and left arm.  When he stops it goes away.  He tells me he can carry a 50 pound bag in both arms and not have the symptoms.  He does think his symptoms are getting worse.  He has noted some associated shortness of breath.  He is also having claudication symptoms in both legs.  LE a Dopplers were done yesterday, I do not have those results but the Dopplers done in December 2020 did suggest restenosis but Dr. Allyson Sabal felt the patient  was asymptomatic then.  Past Medical History:  Diagnosis Date  . Claudication (HCC) 06/27/2011   Left ABIs abnormal values with mild-moderate arterial insufficiency, right SFA moderate amount of mixed density plaque elevating velocities suggestive 50-69% diameter reduction  . Coronary artery disease 06/27/2011   Normal Lexiscan, EF 64, post-stress EF 64, no EKG changes  . Diabetes mellitus without complication (HCC)    prediabetic  . Hyperlipidemia   . Hypertension   . Impotence of organic origin   . Osteoarthritis   . Other testicular hypofunction   . PVD (peripheral vascular disease) (HCC)     Past Surgical History:  Procedure Laterality Date  . CARDIAC CATHETERIZATION  12/26/2004   Left main-90% distal tapered stenosis, left circumflex-95% ostial stenosis, first marginal branch 99% ostial branch, posterior descending artery 99% ostial stenosis, 60-70% segmental mid stenosis, needed CABG  . CORONARY ARTERY BYPASS GRAFT  12/27/2004   Left main 3-vessel disease, LIMA to LAD, saphenous vein to circumflexmarginal branch and to the PDA  . INGUINAL HERNIA REPAIR Left 2005  . LOWER EXTREMITY ANGIOGRAPHY N/A 04/13/2017   Procedure: LOWER EXTREMITY ANGIOGRAPHY;  Surgeon: [name], MD;  Location: [facility];  Service: Cardiovascular;  Laterality: N/A;  . LOWER EXTREMITY INTERVENTION N/A 08/04/2016   Procedure: Lower Extremity Intervention;  Surgeon: [name], MD;  Location: [facility];  Service: Cardiovascular;  Laterality: N/A;  . PERIPHERAL VASCULAR ATHERECTOMY Left 04/13/2017   Procedure: PERIPHERAL VASCULAR ATHERECTOMY;  Surgeon: [name], MD;  Location: [facility];  Service: Cardiovascular;  Laterality: Left;  SFA  . PERIPHERAL VASCULAR BALLOON ANGIOPLASTY Left 04/13/2017   Procedure: PERIPHERAL VASCULAR BALLOON ANGIOPLASTY;  Surgeon: [name], MD;  Location: [facility];  Service: Cardiovascular;  Laterality: Left;  SFA     Current Medications: No outpatient medications have been marked as taking for the 04/27/20 encounter (Office Visit) with Abelino Derrick, PA-C.     Allergies:   Crestor [rosuvastatin] and Lisinopril   Social History   Socioeconomic History  . Marital status: Married    Spouse name: cathy  . Number of children: 1  . Years of education: 36  . Highest education level: Not on file  Occupational History  . Occupation: retired  Tobacco Use  . Smoking status: Former Smoker    Packs/day: 1.00    Years: 50.00    Pack years: 50.00    Types: Cigarettes    Quit date: 12/17/2004    Years since quitting: 15.3  . Smokeless tobacco: Never Used  Vaping Use  . Vaping Use: Never used  Substance and Sexual Activity  . Alcohol use: Yes    Comment: occasionally  . Drug use: No  . Sexual activity: Not on file  Other Topics Concern  . Not on file  Social History Narrative   Lives with wife   Caffeine - coffee, 1 cup daily   Social Determinants of Health   Financial Resource Strain: Not on file  Food Insecurity: Not on file  Transportation Needs: Not on file  Physical Activity: Not on file  Stress: Not on file  Social Connections: Not on file     Family History: The patient's family history includes Clotting disorder in his father; Diabetes in his brother; Hypertension in his mother.  ROS:   Please see the history of present illness. He has a history of "severe" spinal stenosis by MRI 2018  All other systems reviewed and are negative.  EKGs/Labs/Other Studies Reviewed:    The following studies were reviewed today: Myoview 07/14/2019-  Nuclear stress EF: 54%. Visually, the EF appears to be greater  There was no ST segment deviation noted during stress.  This is a low risk study. No evidence of ischemia. No evidence of previous infarction.  The study is normal.    EKG:  EKG is ordered today.  The ekg ordered today demonstrates NSR, 68  Recent Labs: 03/30/2020: ALT 19   Recent Lipid Panel    Component Value Date/Time   CHOL 99 (L) 03/30/2020 0814   TRIG 72 03/30/2020 0814   HDL 51 03/30/2020 0814   CHOLHDL 1.9 03/30/2020 0814   CHOLHDL 4.3 09/16/2016 0800   VLDL 15 09/16/2016 0800   LDLCALC 33 03/30/2020 0814    Physical Exam:    VS:  BP 112/60   Pulse 68   Ht 6\' 1"  (1.854 m)   Wt 221 lb 12.8 oz (100.6 kg)   BMI 29.26 kg/m     Wt Readings from Last 3 Encounters:  04/27/20 221 lb 12.8 oz (100.6 kg)  08/02/19 226 lb 9.6 oz (102.8 kg)  07/14/19 229 lb (103.9 kg)     GEN:  Well nourished, well developed in no acute distress HEENT: Normal NECK: No JVD CARDIAC: RRR, no murmurs, rubs, gallops RESPIRATORY:  Clear to auscultation without rales, wheezing or rhonchi  ABDOMEN: Soft, non-tender, non-distended MUSCULOSKELETAL:  No edema; No deformity -radial pulses 2=/4 bilaterally  SKIN: Warm and dry NEUROLOGIC:  Alert and oriented x 3 PSYCHIATRIC:  Normal affect   ASSESSMENT:    Chest pain of uncertain etiology The patient has typical and atypical features.  Initially I felt his symptoms were most likely M/S but the fact that low dose Imdur seemed to help is concerning.  Increase Imdur to 30 mg daily.  Hx of CABG CABG x 3 2006-LIMA-LAD, SVG-OM, SVG-PDA Myoview low risk Feb 2021  Peripheral vascular disease (HCC) Bilateral LEA disease, occluded RSFA, 95% LSFA with 2V runoff by angiogram March 2018 Successful LSFA [facility] atherectomy Nov 2018 for claudication. He c/o of bilateral thigh and calf pain- especially when walking uphill- LEA dopplers done yesterday- results pending  Dyslipidemia Statin intolerant-approved for Repatha LDL 33 Nov 2021  HTN (hypertension) Controlled  Spinal stenosis, lumbar H/O spinal stenosis "severe" by MRI 2018  PLAN:    Increase Imdur to 30 mg.  F/U Dr Allyson Sabal in 3-4 weeks.     Medication Adjustments/Labs and Tests Ordered: Current medicines are reviewed at length with the patient today.   Concerns regarding medicines are outlined above.  Orders Placed This Encounter  Procedures  . EKG 12-Lead   Meds ordered this encounter  Medications  . isosorbide mononitrate (IMDUR) 30 MG 24 hr tablet    Sig: Take 1 tablet (30 mg total) by mouth daily.    Dispense:  90 tablet    Refill:  3    Patient Instructions  Medication Instructions:  INCREASE- Isosorbide 30 mg by mouth daily  *If you need a refill on your cardiac medications before your next appointment, please call your pharmacy*   Lab Work: None Ordered   Testing/Procedures: None Ordered   Follow-Up: At [facility], you and your health needs are our priority.  As part of our continuing mission to provide you with exceptional heart care, we have created designated Provider Care Teams.  These Care Teams include your primary Cardiologist (physician) and Advanced Practice Providers (APPs -  Physician Assistants and Nurse Practitioners) who all work together to provide you with the care you need, when you need it.  We recommend signing up for the patient portal called "MyChart".  Sign up information is provided on this After Visit Summary.  MyChart is used to connect with patients for Virtual Visits (Telemedicine).  Patients are able to view lab/test results, encounter notes, upcoming appointments, etc.  Non-urgent messages can be sent to your provider as well.   To learn more about what you can do with MyChart, go to [email or web address].    Your next appointment:   1 month(s)  The format for your next appointment:   In Person  Provider:   You may see Nanetta Batty, MD or one of the following Advanced Practice Providers on your designated Care Team:    Corine Shelter, PA-C  [place], [place]  Edd Fabian, FNP         Signed, Corine Shelter, [place]  04/27/2020 10:22 AM    [facility]

## 2020-04-27 NOTE — Assessment & Plan Note (Signed)
The patient has typical and atypical features.  Initially I felt his symptoms were most likely M/S but the fact that low dose Imdur seemed to help is concerning.  Increase Imdur to 30 mg daily.

## 2020-04-27 NOTE — Assessment & Plan Note (Signed)
Bilateral LEA disease, occluded RSFA, 95% LSFA with 2V runoff by angiogram March 2018 Successful LSFA [facility] atherectomy Nov 2018 for claudication. He c/o of bilateral thigh and calf pain- especially when walking uphill- LEA dopplers done yesterday- results pending

## 2020-04-27 NOTE — Assessment & Plan Note (Signed)
Statin intolerant-approved for Repatha LDL 33 Nov 2021

## 2020-04-27 NOTE — Assessment & Plan Note (Signed)
H/O spinal stenosis "severe" by MRI 2018

## 2020-04-27 NOTE — Patient Instructions (Signed)
Medication Instructions:  INCREASE- Isosorbide 30 mg by mouth daily  *If you need a refill on your cardiac medications before your next appointment, please call your pharmacy*   Lab Work: None Ordered   Testing/Procedures: None Ordered   Follow-Up: At [facility], you and your health needs are our priority.  As part of our continuing mission to provide you with exceptional heart care, we have created designated Provider Care Teams.  These Care Teams include your primary Cardiologist (physician) and Advanced Practice Providers (APPs -  Physician Assistants and Nurse Practitioners) who all work together to provide you with the care you need, when you need it.  We recommend signing up for the patient portal called "MyChart".  Sign up information is provided on this After Visit Summary.  MyChart is used to connect with patients for Virtual Visits (Telemedicine).  Patients are able to view lab/test results, encounter notes, upcoming appointments, etc.  Non-urgent messages can be sent to your provider as well.   To learn more about what you can do with MyChart, go to [email or web address].    Your next appointment:   1 month(s)  The format for your next appointment:   In Person  Provider:   You may see Nanetta Batty, MD or one of the following Advanced Practice Providers on your designated Care Team:    Corine Shelter, PA-C  [place], [place]  Edd Fabian, [place]

## 2020-04-27 NOTE — Assessment & Plan Note (Signed)
Controlled.  

## 2020-04-27 NOTE — Assessment & Plan Note (Signed)
CABG x 3 2006-LIMA-LAD, SVG-OM, SVG-PDA Myoview low risk Feb 2021

## 2020-06-20 ENCOUNTER — Other Ambulatory Visit: Payer: Self-pay | Admitting: Cardiovascular Disease

## 2020-06-26 ENCOUNTER — Other Ambulatory Visit (HOSPITAL_COMMUNITY)
Admission: RE | Admit: 2020-06-26 | Discharge: 2020-06-26 | Disposition: A | Discharge status: Home / Self Care | Payer: Medicare HMO | Source: Ambulatory Visit | Attending: Cardiovascular Disease | Admitting: Cardiovascular Disease

## 2020-06-26 ENCOUNTER — Ambulatory Visit (INDEPENDENT_AMBULATORY_CARE_PROVIDER_SITE_OTHER): Payer: Medicare HMO | Admitting: Cardiovascular Disease

## 2020-06-26 ENCOUNTER — Other Ambulatory Visit: Payer: Self-pay

## 2020-06-26 ENCOUNTER — Encounter: Payer: Self-pay | Admitting: Cardiovascular Disease

## 2020-06-26 DIAGNOSIS — Z01812 Encounter for preprocedural laboratory examination: Secondary | ICD-10-CM | POA: Diagnosis present

## 2020-06-26 DIAGNOSIS — Z951 Presence of aortocoronary bypass graft: Secondary | ICD-10-CM

## 2020-06-26 DIAGNOSIS — I10 Essential (primary) hypertension: Secondary | ICD-10-CM

## 2020-06-26 DIAGNOSIS — Z20822 Contact with and (suspected) exposure to covid-19: Secondary | ICD-10-CM | POA: Diagnosis not present

## 2020-06-26 DIAGNOSIS — I739 Peripheral vascular disease, unspecified: Secondary | ICD-10-CM

## 2020-06-26 DIAGNOSIS — R079 Chest pain, unspecified: Secondary | ICD-10-CM

## 2020-06-26 DIAGNOSIS — E785 Hyperlipidemia, unspecified: Secondary | ICD-10-CM

## 2020-06-26 LAB — SARS CORONAVIRUS 2 (TAT 6-24 HRS): SARS Coronavirus 2: NEGATIVE

## 2020-06-26 MED ORDER — SODIUM CHLORIDE 0.9% FLUSH: 3.0000 mL | Freq: Two times a day (BID) | INTRAVENOUS | Status: DC

## 2020-06-26 NOTE — Assessment & Plan Note (Signed)
History of essential hypertension a blood pressure measured today 140/74.  He is on amlodipine and metoprolol.

## 2020-06-26 NOTE — Assessment & Plan Note (Signed)
History of dyslipidemia on Praluent.  His most recent lipid profile performed 03/30/2020 revealed a total cholesterol 99, LDL of 33 and HDL 51.

## 2020-06-26 NOTE — Patient Instructions (Signed)
    [facility] CARDIOVASCULAR DIVISION [facility] [address] [place] [address] Dept: [phone] Loc: [phone]  Kleber Crean  06/26/2020  You are scheduled for a Cardiac Catheterization on Thursday, February 10 with Dr. Nanetta Batty.  1. Please arrive at the [facility] (Main Entrance A) at [facility]: [address] [place], [place] [address] at 9:30 AM (This time is two hours before your procedure to ensure your preparation). Free valet parking service is available.   Special note: Every effort is made to have your procedure done on time. Please understand that emergencies sometimes delay scheduled procedures.  2. Diet: Do not eat solid foods after midnight.  The patient may have clear liquids until 5am upon the day of the procedure.  3. Labs: You will need to have blood drawn today.  4. Medication instructions in preparation for your procedure:    On the morning of your procedure, take your Aspirin and Plavix/Clopidogrel and any morning medicines NOT listed above.  You may use sips of water.  5. Plan for one night stay--bring personal belongings. 6. Bring a current list of your medications and current insurance cards. 7. You MUST have a responsible person to drive you home. 8. Someone MUST be with you the first 24 hours after you arrive home or your discharge will be delayed. 9. Please wear clothes that are easy to get on and off and wear slip-on shoes.  Thank you for allowing [place] to care for you!   -- [facility]  You will need a COVID-19  test prior to your procedure. You are scheduled for Today 06/26/20 at 11:10 AM. This is a Drive Up Visit at [address] [place], [place] [address]. Someone will direct you to the appropriate testing line. Stay in your car and someone will be with you shortly.  Pt will need a 2 week follow up appointment with Dr. Allyson Sabal.

## 2020-06-26 NOTE — H&P (View-Only) (Signed)
06/26/2020 [name]   1941-05-07  [id number]  Primary Physician [name], MD Primary Cardiologist: [name] MD FACP, [place], [place], [place]  HPI:  [name] is a 80 y.o.  mildly overweight married Caucasian male whoI last saw him in the office 08/02/2019.  He is accompanied by his wife [name] today.  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 [name] December 27, 2004, for left main 3-vessel disease. He had a LIMA to his LAD, a vein to his circumflex marginal branch as well as to the PDA. His other problems include hypertension and hyperlipidemia. He denies chest pain or shortness of breath. He does have left ABI in the 0.85range with a high-frequency signal in his left SFA by duplex ultrasound 10/20/12 but did not claudication at that time.His last Myoview performed 06/27/11 was nonischemic.4 years ago he developed lifestyle limiting bilateral lower extremity claudication. Should be noted that he did stop smoking at the time of his bypass surgery in 2006. He underwent peripheral angiography by myself 08/04/16 revealing a long segment calcified occlusion right SFA and high-grade diffuse calcified left SFA stenosis. I did not think that he could be percutaneously revascularized on the right,and therefore opted for medical therapy.This Pletal has been titrated to 100 mg by mouth twice a day without significant improvement in his claudication symptoms. These are lifestyle limiting. He denies chest pain or shortness of breath. I performed peripheral angiography and endovascular therapy of his diffusely calcified and highly obstructive left SFA using diamondback orbital rotational atherectomy followed by drug-eluting balloon plasty. This left ABI increased from 0.63-.85 and his claudication has significantly improved and were resolved.   His most recent Dopplers  performed 12/9/2020suggest restenosis of his left SFA although he continues to deny claudication. Denies chest pain or shortness of breath. He is statin intolerant andwas recently approved for Repatha.  Since I saw him in the office a year ago he has had progressive upper extremity effort angina.  He did have a negative Myoview 07/14/2019.  He saw Corine Shelter, PA-C in the office 04/27/2020.  His Imdur was increased but this is not affected the frequency and/or severity of his symptoms.  He also complains of claudication bilaterally.  He has a known occluded right SFA from prior angiography status post orbital atherectomy, PTA of his subtotally occluded calcified left SFA 04/13/2017.  Recent Dopplers performed 04/26/2020 revealed a left ABI of 0.52 with an occluded left popliteal artery.   Current Meds  Medication Sig  . Alirocumab (PRALUENT) 150 MG/ML SOAJ Inject 150 mg into the skin every 14 (fourteen) days.  Marland Kitchen amLODipine (NORVASC) 5 MG tablet TAKE 1 TABLET BY MOUTH EVERY DAY  . aspirin 81 MG tablet Take 81 mg by mouth at bedtime.   . clopidogrel (PLAVIX) 75 MG tablet TAKE 1 TABLET BY MOUTH DAILY WITH BREAKFAST.  Marland Kitchen Coenzyme Q10-Fish Oil-Vit E (CO-Q 10 OMEGA-3 FISH OIL) CAPS Take 1 tablet once a day  . isosorbide mononitrate (IMDUR) 30 MG 24 hr tablet Take 1 tablet (30 mg total) by mouth daily.  . metoprolol (LOPRESSOR) 50 MG tablet Take 50 mg by mouth at bedtime.  . Omega-3 Fatty Acids (FISH OIL) 1200 MG CAPS Take 2 capsules (2,400 mg total) by mouth every morning.  . Vitamin D, Ergocalciferol, (DRISDOL) 50000 UNITS CAPS capsule Take 50,000 Units by mouth every Sunday.  Allergies  Allergen Reactions  . Crestor [Rosuvastatin] Other (See Comments)    Myalgias  . Lisinopril     cough    Social History   Socioeconomic History  . Marital status: Married    Spouse name: cathy  . Number of children: 1  . Years of education: 48  . Highest education level: Not on file  Occupational  History  . Occupation: retired  Tobacco Use  . Smoking status: Former Smoker    Packs/day: 1.00    Years: 50.00    Pack years: 50.00    Types: Cigarettes    Quit date: 12/17/2004    Years since quitting: 15.5  . Smokeless tobacco: Never Used  Vaping Use  . Vaping Use: Never used  Substance and Sexual Activity  . Alcohol use: Yes    Comment: occasionally  . Drug use: No  . Sexual activity: Not on file  Other Topics Concern  . Not on file  Social History Narrative   Lives with wife   Caffeine - coffee, 1 cup daily   Social Determinants of Health   Financial Resource Strain: Not on file  Food Insecurity: Not on file  Transportation Needs: Not on file  Physical Activity: Not on file  Stress: Not on file  Social Connections: Not on file  Intimate Partner Violence: Not on file     Review of Systems: General: negative for chills, fever, night sweats or weight changes.  Cardiovascular: negative for chest pain, dyspnea on exertion, edema, orthopnea, palpitations, paroxysmal nocturnal dyspnea or shortness of breath Dermatological: negative for rash Respiratory: negative for cough or wheezing Urologic: negative for hematuria Abdominal: negative for nausea, vomiting, diarrhea, bright red blood per rectum, melena, or hematemesis Neurologic: negative for visual changes, syncope, or dizziness All other systems reviewed and are otherwise negative except as noted above.    Blood pressure 140/74, pulse 71, height 6\' 1"  (1.854 m), weight 226 lb 9.6 oz (102.8 kg), SpO2 96 %.  General appearance: alert and no distress Neck: no adenopathy, no carotid bruit, no JVD, supple, symmetrical, trachea midline and thyroid not enlarged, symmetric, no tenderness/mass/nodules Lungs: clear to auscultation bilaterally Heart: regular rate and rhythm, S1, S2 normal, no murmur, click, rub or gallop Extremities: extremities normal, atraumatic, no cyanosis or edema Pulses: 2+ and symmetric Absent pedal  pulses Skin: Skin color, texture, turgor normal. No rashes or lesions Neurologic: Alert and oriented X 3, normal strength and tone. Normal symmetric reflexes. Normal coordination and gait  EKG sinus rhythm at 66 without ST or T wave changes.  I personally reviewed this EKG.  ASSESSMENT AND PLAN:   Hx of CABG History of CABG by Dr. 12/27/2004 for left main/three-vessel disease.  LIMA to his LAD, a vein to the circumflex obtuse marginal branch and to the PDA.  He did have a Myoview stress test performed 07/14/2019 that was nonischemic.  He continues to have exertional chest pain.  I decided to proceed with outpatient diagnostic coronary angiography to further define his anatomy given the fact that he has 79 year old vein graft.  HTN (hypertension) History of essential hypertension a blood pressure measured today 140/74.  He is on amlodipine and metoprolol.  Dyslipidemia History of dyslipidemia on Praluent.  His most recent lipid profile performed 03/30/2020 revealed a total cholesterol 99, LDL of 33 and HDL 51.  Peripheral vascular disease (HCC) History of PAD with a known occluded right SFA by angiography in March 2018.  He underwent orbital atherectomy, PTA by myself  04/13/2017 with an excellent result.  He has three-vessel runoff.  He does have lifestyle limiting claudication with recent Dopplers performed 04/26/2020 revealed a right ABI of 0.74 and a left of 0.52 with what appears to be an occluded left popliteal artery.  We will address this angiographically and endovascularly sometime in the near future.      Shigeru Lampert J. Evangelynn Lochridge MD FACP,FACC,FAHA, FSCAI 06/26/2020 9:21 AM 

## 2020-06-26 NOTE — Assessment & Plan Note (Signed)
History of CABG by Dr. Cornelius Moras 12/27/2004 for left main/three-vessel disease.  LIMA to his LAD, a vein to the circumflex obtuse marginal branch and to the PDA.  He did have a Myoview stress test performed 07/14/2019 that was nonischemic.  He continues to have exertional chest pain.  I decided to proceed with outpatient diagnostic coronary angiography to further define his anatomy given the fact that he has 79 year old vein graft.

## 2020-06-26 NOTE — Progress Notes (Signed)
06/26/2020 [name]   1941-05-07  [id number]  Primary Physician [name], MD Primary Cardiologist: [name] MD FACP, [place], [place], [place]  HPI:  [name] is a 80 y.o.  mildly overweight married Caucasian male whoI last saw him in the office 08/02/2019.  He is accompanied by his wife [name] today.  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 [name] December 27, 2004, for left main 3-vessel disease. He had a LIMA to his LAD, a vein to his circumflex marginal branch as well as to the PDA. His other problems include hypertension and hyperlipidemia. He denies chest pain or shortness of breath. He does have left ABI in the 0.85range with a high-frequency signal in his left SFA by duplex ultrasound 10/20/12 but did not claudication at that time.His last Myoview performed 06/27/11 was nonischemic.4 years ago he developed lifestyle limiting bilateral lower extremity claudication. Should be noted that he did stop smoking at the time of his bypass surgery in 2006. He underwent peripheral angiography by myself 08/04/16 revealing a long segment calcified occlusion right SFA and high-grade diffuse calcified left SFA stenosis. I did not think that he could be percutaneously revascularized on the right,and therefore opted for medical therapy.This Pletal has been titrated to 100 mg by mouth twice a day without significant improvement in his claudication symptoms. These are lifestyle limiting. He denies chest pain or shortness of breath. I performed peripheral angiography and endovascular therapy of his diffusely calcified and highly obstructive left SFA using diamondback orbital rotational atherectomy followed by drug-eluting balloon plasty. This left ABI increased from 0.63-.85 and his claudication has significantly improved and were resolved.   His most recent Dopplers  performed 12/9/2020suggest restenosis of his left SFA although he continues to deny claudication. Denies chest pain or shortness of breath. He is statin intolerant andwas recently approved for Repatha.  Since I saw him in the office a year ago he has had progressive upper extremity effort angina.  He did have a negative Myoview 07/14/2019.  He saw Corine Shelter, PA-C in the office 04/27/2020.  His Imdur was increased but this is not affected the frequency and/or severity of his symptoms.  He also complains of claudication bilaterally.  He has a known occluded right SFA from prior angiography status post orbital atherectomy, PTA of his subtotally occluded calcified left SFA 04/13/2017.  Recent Dopplers performed 04/26/2020 revealed a left ABI of 0.52 with an occluded left popliteal artery.   Current Meds  Medication Sig  . Alirocumab (PRALUENT) 150 MG/ML SOAJ Inject 150 mg into the skin every 14 (fourteen) days.  Marland Kitchen amLODipine (NORVASC) 5 MG tablet TAKE 1 TABLET BY MOUTH EVERY DAY  . aspirin 81 MG tablet Take 81 mg by mouth at bedtime.   . clopidogrel (PLAVIX) 75 MG tablet TAKE 1 TABLET BY MOUTH DAILY WITH BREAKFAST.  Marland Kitchen Coenzyme Q10-Fish Oil-Vit E (CO-Q 10 OMEGA-3 FISH OIL) CAPS Take 1 tablet once a day  . isosorbide mononitrate (IMDUR) 30 MG 24 hr tablet Take 1 tablet (30 mg total) by mouth daily.  . metoprolol (LOPRESSOR) 50 MG tablet Take 50 mg by mouth at bedtime.  . Omega-3 Fatty Acids (FISH OIL) 1200 MG CAPS Take 2 capsules (2,400 mg total) by mouth every morning.  . Vitamin D, Ergocalciferol, (DRISDOL) 50000 UNITS CAPS capsule Take 50,000 Units by mouth every Sunday.  Allergies  Allergen Reactions  . Crestor [Rosuvastatin] Other (See Comments)    Myalgias  . Lisinopril     cough    Social History   Socioeconomic History  . Marital status: Married    Spouse name: cathy  . Number of children: 1  . Years of education: 48  . Highest education level: Not on file  Occupational  History  . Occupation: retired  Tobacco Use  . Smoking status: Former Smoker    Packs/day: 1.00    Years: 50.00    Pack years: 50.00    Types: Cigarettes    Quit date: 12/17/2004    Years since quitting: 15.5  . Smokeless tobacco: Never Used  Vaping Use  . Vaping Use: Never used  Substance and Sexual Activity  . Alcohol use: Yes    Comment: occasionally  . Drug use: No  . Sexual activity: Not on file  Other Topics Concern  . Not on file  Social History Narrative   Lives with wife   Caffeine - coffee, 1 cup daily   Social Determinants of Health   Financial Resource Strain: Not on file  Food Insecurity: Not on file  Transportation Needs: Not on file  Physical Activity: Not on file  Stress: Not on file  Social Connections: Not on file  Intimate Partner Violence: Not on file     Review of Systems: General: negative for chills, fever, night sweats or weight changes.  Cardiovascular: negative for chest pain, dyspnea on exertion, edema, orthopnea, palpitations, paroxysmal nocturnal dyspnea or shortness of breath Dermatological: negative for rash Respiratory: negative for cough or wheezing Urologic: negative for hematuria Abdominal: negative for nausea, vomiting, diarrhea, bright red blood per rectum, melena, or hematemesis Neurologic: negative for visual changes, syncope, or dizziness All other systems reviewed and are otherwise negative except as noted above.    Blood pressure 140/74, pulse 71, height 6\' 1"  (1.854 m), weight 226 lb 9.6 oz (102.8 kg), SpO2 96 %.  General appearance: alert and no distress Neck: no adenopathy, no carotid bruit, no JVD, supple, symmetrical, trachea midline and thyroid not enlarged, symmetric, no tenderness/mass/nodules Lungs: clear to auscultation bilaterally Heart: regular rate and rhythm, S1, S2 normal, no murmur, click, rub or gallop Extremities: extremities normal, atraumatic, no cyanosis or edema Pulses: 2+ and symmetric Absent pedal  pulses Skin: Skin color, texture, turgor normal. No rashes or lesions Neurologic: Alert and oriented X 3, normal strength and tone. Normal symmetric reflexes. Normal coordination and gait  EKG sinus rhythm at 66 without ST or T wave changes.  I personally reviewed this EKG.  ASSESSMENT AND PLAN:   Hx of CABG History of CABG by Dr. 12/27/2004 for left main/three-vessel disease.  LIMA to his LAD, a vein to the circumflex obtuse marginal branch and to the PDA.  He did have a Myoview stress test performed 07/14/2019 that was nonischemic.  He continues to have exertional chest pain.  I decided to proceed with outpatient diagnostic coronary angiography to further define his anatomy given the fact that he has 79 year old vein graft.  HTN (hypertension) History of essential hypertension a blood pressure measured today 140/74.  He is on amlodipine and metoprolol.  Dyslipidemia History of dyslipidemia on Praluent.  His most recent lipid profile performed 03/30/2020 revealed a total cholesterol 99, LDL of 33 and HDL 51.  Peripheral vascular disease (HCC) History of PAD with a known occluded right SFA by angiography in March 2018.  He underwent orbital atherectomy, PTA by myself  04/13/2017 with an excellent result.  He has three-vessel runoff.  He does have lifestyle limiting claudication with recent Dopplers performed 04/26/2020 revealed a right ABI of 0.74 and a left of 0.52 with what appears to be an occluded left popliteal artery.  We will address this angiographically and endovascularly sometime in the near future.      [name] MD FACP,FACC,FAHA, [facility] 06/26/2020 9:21 AM

## 2020-06-26 NOTE — Assessment & Plan Note (Signed)
History of PAD with a known occluded right SFA by angiography in March 2018.  He underwent orbital atherectomy, PTA by myself 04/13/2017 with an excellent result.  He has three-vessel runoff.  He does have lifestyle limiting claudication with recent Dopplers performed 04/26/2020 revealed a right ABI of 0.74 and a left of 0.52 with what appears to be an occluded left popliteal artery.  We will address this angiographically and endovascularly sometime in the near future.

## 2020-06-27 LAB — BASIC METABOLIC PANEL
BUN/Creatinine Ratio: 20 (ref 10–24)
BUN: 14 mg/dL (ref 8–27)
CO2: 22 mmol/L (ref 20–29)
Calcium: 9.3 mg/dL (ref 8.6–10.2)
Chloride: 106 mmol/L (ref 96–106)
Creatinine, Ser: 0.71 mg/dL — ABNORMAL LOW (ref 0.76–1.27)
GFR calc Af Amer: 104 mL/min/{1.73_m2} (ref >59)
GFR calc non Af Amer: 90 mL/min/{1.73_m2} (ref >59)
Glucose: 114 mg/dL — ABNORMAL HIGH (ref 65–99)
Potassium: 4.4 mmol/L (ref 3.5–5.2)
Sodium: 144 mmol/L (ref 134–144)

## 2020-06-27 LAB — CBC
Hematocrit: 43.4 % (ref 37.5–51.0)
Hemoglobin: 14.6 g/dL (ref 13.0–17.7)
MCH: 29.8 pg (ref 26.6–33.0)
MCHC: 33.6 g/dL (ref 31.5–35.7)
MCV: 89 fL (ref 79–97)
Platelets: 278 10*3/uL (ref 150–450)
RBC: 4.9 x10E6/uL (ref 4.14–5.80)
RDW: 13.3 % (ref 11.6–15.4)
WBC: 10.2 10*3/uL (ref 3.4–10.8)

## 2020-06-27 NOTE — Progress Notes (Signed)
Attempted to call patient regarding procedure instructions for tomorrow.  Left voice mail, nothing to eat or drink after midnight, need responsible adult to drive you home and stay with you for 1st 24 hours.  Ok to take all morning medications.  Arrival time is 9:30

## 2020-06-28 ENCOUNTER — Other Ambulatory Visit: Payer: Self-pay

## 2020-06-28 ENCOUNTER — Encounter (HOSPITAL_COMMUNITY)
Admission: RE | Disposition: A | Discharge status: Home / Self Care | Payer: Self-pay | Source: Home / Self Care | Attending: Cardiovascular Disease

## 2020-06-28 ENCOUNTER — Ambulatory Visit (HOSPITAL_COMMUNITY)
Admission: RE | Admit: 2020-06-28 | Discharge: 2020-06-28 | Disposition: A | Discharge status: Home / Self Care | Payer: Medicare HMO | Attending: Cardiovascular Disease | Admitting: Cardiovascular Disease

## 2020-06-28 DIAGNOSIS — I10 Essential (primary) hypertension: Secondary | ICD-10-CM | POA: Diagnosis not present

## 2020-06-28 DIAGNOSIS — I2510 Atherosclerotic heart disease of native coronary artery without angina pectoris: Secondary | ICD-10-CM | POA: Diagnosis not present

## 2020-06-28 DIAGNOSIS — I739 Peripheral vascular disease, unspecified: Secondary | ICD-10-CM | POA: Diagnosis not present

## 2020-06-28 DIAGNOSIS — R0789 Other chest pain: Secondary | ICD-10-CM | POA: Diagnosis present

## 2020-06-28 DIAGNOSIS — I25119 Atherosclerotic heart disease of native coronary artery with unspecified angina pectoris: Secondary | ICD-10-CM | POA: Diagnosis not present

## 2020-06-28 DIAGNOSIS — R079 Chest pain, unspecified: Secondary | ICD-10-CM | POA: Diagnosis present

## 2020-06-28 DIAGNOSIS — Z7982 Long term (current) use of aspirin: Secondary | ICD-10-CM | POA: Insufficient documentation

## 2020-06-28 DIAGNOSIS — Z79899 Other long term (current) drug therapy: Secondary | ICD-10-CM | POA: Insufficient documentation

## 2020-06-28 DIAGNOSIS — E785 Hyperlipidemia, unspecified: Secondary | ICD-10-CM | POA: Diagnosis not present

## 2020-06-28 DIAGNOSIS — Z951 Presence of aortocoronary bypass graft: Secondary | ICD-10-CM | POA: Diagnosis not present

## 2020-06-28 DIAGNOSIS — Z7902 Long term (current) use of antithrombotics/antiplatelets: Secondary | ICD-10-CM | POA: Insufficient documentation

## 2020-06-28 DIAGNOSIS — Z888 Allergy status to other drugs, medicaments and biological substances status: Secondary | ICD-10-CM | POA: Diagnosis not present

## 2020-06-28 DIAGNOSIS — Z87891 Personal history of nicotine dependence: Secondary | ICD-10-CM | POA: Diagnosis not present

## 2020-06-28 HISTORY — PX: RIGHT/LEFT HEART CATH AND CORONARY/GRAFT ANGIOGRAPHY: CATH118267

## 2020-06-28 HISTORY — PX: ABDOMINAL AORTOGRAM: CATH118222

## 2020-06-28 LAB — POCT I-STAT 7, (LYTES, BLD GAS, ICA,H+H)
Acid-base deficit: 1 mmol/L (ref 0.0–2.0)
Bicarbonate: 25 mmol/L (ref 20.0–28.0)
Calcium, Ion: 1.24 mmol/L (ref 1.15–1.40)
HCT: 39 % (ref 39.0–52.0)
Hemoglobin: 13.3 g/dL (ref 13.0–17.0)
O2 Saturation: 98 %
Potassium: 3.8 mmol/L (ref 3.5–5.1)
Sodium: 143 mmol/L (ref 135–145)
TCO2: 26 mmol/L (ref 22–32)
pCO2 arterial: 44.5 mmHg (ref 32.0–48.0)
pH, Arterial: 7.359 (ref 7.350–7.450)
pO2, Arterial: 105 mmHg (ref 83.0–108.0)

## 2020-06-28 LAB — POCT I-STAT EG7
Acid-Base Excess: 0 mmol/L (ref 0.0–2.0)
Acid-Base Excess: 1 mmol/L (ref 0.0–2.0)
Bicarbonate: 25.9 mmol/L (ref 20.0–28.0)
Bicarbonate: 26.9 mmol/L (ref 20.0–28.0)
Calcium, Ion: 1.24 mmol/L (ref 1.15–1.40)
Calcium, Ion: 1.25 mmol/L (ref 1.15–1.40)
HCT: 39 % (ref 39.0–52.0)
HCT: 39 % (ref 39.0–52.0)
Hemoglobin: 13.3 g/dL (ref 13.0–17.0)
Hemoglobin: 13.3 g/dL (ref 13.0–17.0)
O2 Saturation: 68 %
O2 Saturation: 69 %
Potassium: 3.8 mmol/L (ref 3.5–5.1)
Potassium: 3.9 mmol/L (ref 3.5–5.1)
Sodium: 144 mmol/L (ref 135–145)
Sodium: 144 mmol/L (ref 135–145)
TCO2: 27 mmol/L (ref 22–32)
TCO2: 28 mmol/L (ref 22–32)
pCO2, Ven: 46 mmHg (ref 44.0–60.0)
pCO2, Ven: 48.1 mmHg (ref 44.0–60.0)
pH, Ven: 7.355 (ref 7.250–7.430)
pH, Ven: 7.359 (ref 7.250–7.430)
pO2, Ven: 37 mmHg (ref 32.0–45.0)
pO2, Ven: 38 mmHg (ref 32.0–45.0)

## 2020-06-28 SURGERY — RIGHT/LEFT HEART CATH AND CORONARY/GRAFT ANGIOGRAPHY
Anesthesia: LOCAL

## 2020-06-28 MED ORDER — SODIUM CHLORIDE 0.9% FLUSH: 3.0000 mL | INTRAVENOUS | Status: DC | PRN

## 2020-06-28 MED ORDER — IOHEXOL 350 MG/ML SOLN
INTRAVENOUS | Status: DC | PRN
  Administered 2020-06-28: 100 mL

## 2020-06-28 MED ORDER — IOHEXOL 350 MG/ML SOLN
INTRAVENOUS | Status: AC
  Filled 2020-06-28: qty 1

## 2020-06-28 MED ORDER — MIDAZOLAM HCL 2 MG/2ML IJ SOLN
INTRAMUSCULAR | Status: AC
  Filled 2020-06-28: qty 2

## 2020-06-28 MED ORDER — HEPARIN (PORCINE) IN NACL 1000-0.9 UT/500ML-% IV SOLN
INTRAVENOUS | Status: AC
  Filled 2020-06-28: qty 1000

## 2020-06-28 MED ORDER — LABETALOL HCL 5 MG/ML IV SOLN: 10.0000 mg | INTRAVENOUS | Status: DC | PRN

## 2020-06-28 MED ORDER — SODIUM CHLORIDE 0.9 % IV SOLN: INTRAVENOUS | Status: DC

## 2020-06-28 MED ORDER — FENTANYL CITRATE (PF) 100 MCG/2ML IJ SOLN
INTRAMUSCULAR | Status: DC | PRN
  Administered 2020-06-28: 25 ug via INTRAVENOUS

## 2020-06-28 MED ORDER — ASPIRIN 81 MG PO CHEW: 81.0000 mg | CHEWABLE_TABLET | ORAL | Status: DC

## 2020-06-28 MED ORDER — HYDRALAZINE HCL 20 MG/ML IJ SOLN: 10.0000 mg | INTRAMUSCULAR | Status: DC | PRN

## 2020-06-28 MED ORDER — MORPHINE SULFATE (PF) 2 MG/ML IV SOLN: 2.0000 mg | INTRAVENOUS | Status: DC | PRN

## 2020-06-28 MED ORDER — LIDOCAINE HCL (PF) 1 % IJ SOLN
INTRAMUSCULAR | Status: DC | PRN
  Administered 2020-06-28: 20 mL

## 2020-06-28 MED ORDER — SODIUM CHLORIDE 0.9 % WEIGHT BASED INFUSION: 1.0000 mL/kg/h | INTRAVENOUS | Status: DC

## 2020-06-28 MED ORDER — LIDOCAINE HCL (PF) 1 % IJ SOLN
INTRAMUSCULAR | Status: AC
  Filled 2020-06-28: qty 30

## 2020-06-28 MED ORDER — SODIUM CHLORIDE 0.9 % IV SOLN: 250.0000 mL | INTRAVENOUS | Status: DC | PRN

## 2020-06-28 MED ORDER — ONDANSETRON HCL 4 MG/2ML IJ SOLN: 4.0000 mg | Freq: Four times a day (QID) | INTRAMUSCULAR | Status: DC | PRN

## 2020-06-28 MED ORDER — HEPARIN (PORCINE) IN NACL 1000-0.9 UT/500ML-% IV SOLN
INTRAVENOUS | Status: DC | PRN
  Administered 2020-06-28: 500 mL

## 2020-06-28 MED ORDER — SODIUM CHLORIDE 0.9% FLUSH: 3.0000 mL | Freq: Two times a day (BID) | INTRAVENOUS | Status: DC

## 2020-06-28 MED ORDER — FENTANYL CITRATE (PF) 100 MCG/2ML IJ SOLN
INTRAMUSCULAR | Status: AC
  Filled 2020-06-28: qty 2

## 2020-06-28 MED ORDER — CLOPIDOGREL BISULFATE 75 MG PO TABS: 75.0000 mg | ORAL_TABLET | Freq: Every day | ORAL | Status: DC

## 2020-06-28 MED ORDER — ASPIRIN 81 MG PO CHEW: 81.0000 mg | CHEWABLE_TABLET | Freq: Every day | ORAL | Status: DC

## 2020-06-28 MED ORDER — ACETAMINOPHEN 325 MG PO TABS: 650.0000 mg | ORAL_TABLET | ORAL | Status: DC | PRN

## 2020-06-28 MED ORDER — SODIUM CHLORIDE 0.9 % WEIGHT BASED INFUSION
3.0000 mL/kg/h | INTRAVENOUS | Status: AC
  Administered 2020-06-28: 3 mL/kg/h via INTRAVENOUS

## 2020-06-28 MED ORDER — MIDAZOLAM HCL 2 MG/2ML IJ SOLN
INTRAMUSCULAR | Status: DC | PRN
  Administered 2020-06-28: 1 mg via INTRAVENOUS

## 2020-06-28 SURGICAL SUPPLY — 18 items
BAG SNAP BAND KOVER 36X36 (MISCELLANEOUS) ×1 IMPLANT
CATH INFINITI 5 FR IM (CATHETERS) ×1 IMPLANT
CATH INFINITI 5FR MULTPACK ANG (CATHETERS) ×1 IMPLANT
CATH SWAN GANZ 7F STRAIGHT (CATHETERS) ×1 IMPLANT
CLOSURE MYNX CONTROL 5F (Vascular Products) ×1 IMPLANT
CLOSURE MYNX CONTROL 6F/7F (Vascular Products) ×1 IMPLANT
COVER DOME SNAP 22 D (MISCELLANEOUS) ×1 IMPLANT
KIT HEART LEFT (KITS) ×2 IMPLANT
PACK CARDIAC CATHETERIZATION (CUSTOM PROCEDURE TRAY) ×2 IMPLANT
SHEATH PINNACLE 5F 10CM (SHEATH) ×1 IMPLANT
SHEATH PINNACLE 7F 10CM (SHEATH) ×1 IMPLANT
SHEATH PROBE COVER 6X72 (BAG) ×1 IMPLANT
SYR MEDRAD MARK 7 150ML (SYRINGE) ×2 IMPLANT
TRANSDUCER W/STOPCOCK (MISCELLANEOUS) ×2 IMPLANT
TUBING CIL FLEX 10 FLL-RA (TUBING) ×2 IMPLANT
WIRE EMERALD 3MM-J .035X150CM (WIRE) ×1 IMPLANT
WIRE EMERALD 3MM-J .035X260CM (WIRE) ×1 IMPLANT
WIRE HI TORQ VERSACORE-J 145CM (WIRE) ×1 IMPLANT

## 2020-06-28 NOTE — Interval H&P Note (Signed)
Cath Lab Visit (complete for each Cath Lab visit)  Clinical Evaluation Leading to the Procedure:   ACS: No.  Non-ACS:    Anginal Classification: CCS II  Anti-ischemic medical therapy: Minimal Therapy (1 class of medications)  Non-Invasive Test Results: No non-invasive testing performed  Prior CABG: Previous CABG      History and Physical Interval Note:  06/28/2020 12:16 PM  [name]  has presented today for surgery, with the diagnosis of unstable angina.  The various methods of treatment have been discussed with the patient and family. After consideration of risks, benefits and other options for treatment, the patient has consented to  Procedure(s): LEFT HEART CATH AND CORS/GRAFTS ANGIOGRAPHY (N/A) as a surgical intervention.  The patient's history has been reviewed, patient examined, no change in status, stable for surgery.  I have reviewed the patient's chart and labs.  Questions were answered to the patient's satisfaction.     Nanetta Batty

## 2020-06-28 NOTE — Discharge Instructions (Signed)

## 2020-06-28 NOTE — Progress Notes (Signed)
Pt ambulated without difficulty or bleeding.   Discharged home with wife who will drive and stay with pt x 24 hrs 

## 2020-06-29 ENCOUNTER — Encounter (HOSPITAL_COMMUNITY): Payer: Self-pay | Admitting: Cardiovascular Disease

## 2020-07-10 ENCOUNTER — Encounter: Payer: Self-pay | Admitting: Cardiovascular Disease

## 2020-07-10 ENCOUNTER — Ambulatory Visit (INDEPENDENT_AMBULATORY_CARE_PROVIDER_SITE_OTHER): Payer: Medicare HMO | Admitting: Cardiovascular Disease

## 2020-07-10 ENCOUNTER — Other Ambulatory Visit: Payer: Self-pay

## 2020-07-10 VITALS — BP 112/66 | HR 64 | Ht 73.0 in | Wt 215.0 lb

## 2020-07-10 DIAGNOSIS — E785 Hyperlipidemia, unspecified: Secondary | ICD-10-CM | POA: Diagnosis not present

## 2020-07-10 DIAGNOSIS — Z951 Presence of aortocoronary bypass graft: Secondary | ICD-10-CM | POA: Diagnosis not present

## 2020-07-10 DIAGNOSIS — I739 Peripheral vascular disease, unspecified: Secondary | ICD-10-CM | POA: Diagnosis not present

## 2020-07-10 DIAGNOSIS — I10 Essential (primary) hypertension: Secondary | ICD-10-CM | POA: Diagnosis not present

## 2020-07-10 LAB — HEPATIC FUNCTION PANEL
ALT: 26 IU/L (ref 0–44)
AST: 18 IU/L (ref 0–40)
Albumin: 4.5 g/dL (ref 3.7–4.7)
Alkaline Phosphatase: 85 IU/L (ref 44–121)
Bilirubin Total: 1 mg/dL (ref 0.0–1.2)
Bilirubin, Direct: 0.26 mg/dL (ref 0.00–0.40)
Total Protein: 6.9 g/dL (ref 6.0–8.5)

## 2020-07-10 LAB — LIPID PANEL
Chol/HDL Ratio: 4.4 ratio (ref 0.0–5.0)
Cholesterol, Total: 176 mg/dL (ref 100–199)
HDL: 40 mg/dL (ref >39)
LDL Chol Calc (NIH): 121 mg/dL — ABNORMAL HIGH (ref 0–99)
Triglycerides: 78 mg/dL (ref 0–149)
VLDL Cholesterol Cal: 15 mg/dL (ref 5–40)

## 2020-07-10 MED ORDER — CILOSTAZOL 50 MG PO TABS: 50.0000 mg | ORAL_TABLET | Freq: Two times a day (BID) | ORAL | 4 refills | Status: DC

## 2020-07-10 NOTE — Progress Notes (Signed)
07/11/2020 [name]   [birth year]  [id number]  Primary Physician [name], MD Primary Cardiologist: [name] MD FACP, [place], [place], [place]  HPI:  [name] is a 80 y.o.  mildly overweight married Caucasian male whoI last saw him in the office 08/02/2019.  He is accompanied by his wife [name] today.  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 [name] December 27, 2004, for left main 3-vessel disease. He had a LIMA to his LAD, a vein to his circumflex marginal branch as well as to the PDA. His other problems include hypertension and hyperlipidemia. He denies chest pain or shortness of breath. He does have left ABI in the 0.85range with a high-frequency signal in his left SFA by duplex ultrasound 10/20/12 but did not claudication at that time.His last Myoview performed 06/27/11 was nonischemic.4 years ago he developed lifestyle limiting bilateral lower extremity claudication. Should be noted that he did stop smoking at the time of his bypass surgery in 2006. He underwent peripheral angiography by myself 08/04/16 revealing a long segment calcified occlusion right SFA and high-grade diffuse calcified left SFA stenosis. I did not think that he could be percutaneously revascularized on the right,and therefore opted for medical therapy.This Pletal has been titrated to 100 mg by mouth twice a day without significant improvement in his claudication symptoms. These are lifestyle limiting. He denies chest pain or shortness of breath. I performed peripheral angiography and endovascular therapy of his diffusely calcified and highly obstructive left SFA using diamondback orbital rotational atherectomy followed by drug-eluting balloon plasty. This left ABI increased from 0.63-.85 and his claudication has significantly improved and were resolved.  He  had progressive upper  extremity effort angina.  He did have a negative Myoview 07/14/2019.  He saw Corine Shelter, PA-C in the office 04/27/2020.  His Imdur was increased but this is not affected the frequency and/or severity of his symptoms.  He also complains of claudication bilaterally.  He has a known occluded right SFA from prior angiography status post orbital atherectomy, PTA of his subtotally occluded calcified left SFA 04/13/2017.  Recent Dopplers performed 04/26/2020 revealed a left ABI of 0.52 with an occluded left popliteal artery.  I performed right left heart cath on him 06/28/2020 revealing a left dominant system, normal LV function, patent vein to the distal left PDA, LM branch and LIMA to the LAD.  He did have a 90% apical LAD stenosis after LIMA insertion.  His hemodynamics are stable.  He says he feels better since the heart cath although I did not do an intervention.  I also performed abdominal aortography and bilateral iliac angiography revealing no obstructive disease.  He does complain of bilateral calf claudication.  The etiology of his chest pain cannot be explained by the findings on his heart cath I have reassured him and his wife [name].   Current Meds  Medication Sig  . Alirocumab (PRALUENT) 150 MG/ML SOAJ Inject 150 mg into the skin every 14 (fourteen) days.  Marland Kitchen amLODipine (NORVASC) 5 MG tablet TAKE 1 TABLET BY MOUTH EVERY DAY (Patient taking differently: Take 5 mg by mouth daily.)  . aspirin 81 MG tablet Take 81 mg by mouth at bedtime.   . cilostazol (PLETAL) 50 MG tablet Take 1 tablet (50 mg total) by mouth 2 (two) times daily.  . clopidogrel (PLAVIX) 75 MG tablet  TAKE 1 TABLET BY MOUTH DAILY WITH BREAKFAST. (Patient taking differently: Take 75 mg by mouth daily.)  . Coenzyme Q10-Fish Oil-Vit E (CO-Q 10 OMEGA-3 FISH OIL) CAPS Take 1 tablet once a day (Patient taking differently: Take 1 capsule by mouth daily. 200 mg)  . isosorbide mononitrate (IMDUR) 30 MG 24 hr tablet Take 1 tablet (30 mg total) by  mouth daily.  . metoprolol (LOPRESSOR) 50 MG tablet Take 730 mg by mouth daily.  . Omega-3 Fatty Acids (FISH OIL) 1200 MG CAPS Take 2 capsules (2,400 mg total) by mouth every morning. (Patient taking differently: Take 2,400 mg by mouth every morning.)  . Vitamin D, Ergocalciferol, (DRISDOL) 50000 UNITS CAPS capsule Take 50,000 Units by mouth every Sunday.    Current Facility-Administered Medications for the 07/10/20 encounter (Office Visit) with [name], MD  Medication  . sodium chloride flush (NS) 0.9 % injection 3 mL     Allergies  Allergen Reactions  . Crestor [Rosuvastatin] Other (See Comments)    Myalgias  . Lisinopril     cough    Social History   Socioeconomic History  . Marital status: Married    Spouse name: cathy  . Number of children: 1  . Years of education: 62  . Highest education level: Not on file  Occupational History  . Occupation: retired  Tobacco Use  . Smoking status: Former Smoker    Packs/day: 1.00    Years: 50.00    Pack years: 50.00    Types: Cigarettes    Quit date: 12/17/2004    Years since quitting: 15.5  . Smokeless tobacco: Never Used  Vaping Use  . Vaping Use: Never used  Substance and Sexual Activity  . Alcohol use: Yes    Comment: occasionally  . Drug use: No  . Sexual activity: Not on file  Other Topics Concern  . Not on file  Social History Narrative   Lives with wife   Caffeine - coffee, 1 cup daily   Social Determinants of Health   Financial Resource Strain: Not on file  Food Insecurity: Not on file  Transportation Needs: Not on file  Physical Activity: Not on file  Stress: Not on file  Social Connections: Not on file  Intimate Partner Violence: Not on file     Review of Systems: General: negative for chills, fever, night sweats or weight changes.  Cardiovascular: negative for chest pain, dyspnea on exertion, edema, orthopnea, palpitations, paroxysmal nocturnal dyspnea or shortness of breath Dermatological:  negative for rash Respiratory: negative for cough or wheezing Urologic: negative for hematuria Abdominal: negative for nausea, vomiting, diarrhea, bright red blood per rectum, melena, or hematemesis Neurologic: negative for visual changes, syncope, or dizziness All other systems reviewed and are otherwise negative except as noted above.    Blood pressure 112/66, pulse 64, height 6\' 1"  (1.854 m), weight 215 lb (97.5 kg).  General appearance: alert and no distress Neck: no adenopathy, no carotid bruit, no JVD, supple, symmetrical, trachea midline and thyroid not enlarged, symmetric, no tenderness/mass/nodules Lungs: clear to auscultation bilaterally Heart: regular rate and rhythm, S1, S2 normal, no murmur, click, rub or gallop Extremities: extremities normal, atraumatic, no cyanosis or edema Pulses: Absent pedal pulses Skin: Skin color, texture, turgor normal. No rashes or lesions Neurologic: Alert and oriented X 3, normal strength and tone. Normal symmetric reflexes. Normal coordination and gait  EKG not performed today  ASSESSMENT AND PLAN:   Hx of CABG History of CABG x3 in 2006 with Myoview  performed February 2021 which was low risk.  He does not complain of exertional chest pain.  I performed outpatient right left heart cath on him 06/28/2020 revealing patent grafts and normal LV function.  There are no culprit lesion lesions identified.  His filling pressures were normal.  HTN (hypertension) History of essential hypertension a blood pressure measured today 112/66.  He is on amlodipine and metoprolol.  Dyslipidemia History of hyperlipidemia with statin intolerance.  He was previously on Praluent however he has not taken it since October because of financial issues.  We will recheck a lipid liver profile this morning.  Lipid profile performed 09/27/2019 revealed total cholesterol 71 with an LDL of less than 10.  The PCSK9 however appeared to be working.    Peripheral vascular disease  (HCC) History of peripheral arterial disease with a known occluded right SFA and recent Dopplers that showed left popliteal artery occlusion.  He has had multiple procedures on his left SFA popliteal artery.  I did do abdominal aortography bilateral iliac angiography during his recent cardiac catheterization 06/28/2020 revealing no obstructive disease in his iliac system.  I am going to begin him on Pletal 50 mg p.o. twice daily and will reassess efficacy in 3 months.      [name] MD FACP,FACC,FAHA, [facility] 07/11/2020 4:33 PM

## 2020-07-10 NOTE — Patient Instructions (Signed)
Medication Instructions:   -Start Pletal 50mg  twice daily.  *If you need a refill on your cardiac medications before your next appointment, please call your pharmacy*   Lab Work: Your physician recommends that you have labs drawn today: lipid/liver profile.  If you have labs (blood work) drawn today and your tests are completely normal, you will receive your results only by: MyChart Message (if you have MyChart) OR . A paper copy in the mail If you have any lab test that is abnormal or we need to change your treatment, we will call you to review the results.   Follow-Up: At [facility], you and your health needs are our priority.  As part of our continuing mission to provide you with exceptional heart care, we have created designated Provider Care Teams.  These Care Teams include your primary Cardiologist (physician) and Advanced Practice Providers (APPs -  Physician Assistants and Nurse Practitioners) who all work together to provide you with the care you need, when you need it.  We recommend signing up for the patient portal called "MyChart".  Sign up information is provided on this After Visit Summary.  MyChart is used to connect with patients for Virtual Visits (Telemedicine).  Patients are able to view lab/test results, encounter notes, upcoming appointments, etc.  Non-urgent messages can be sent to your provider as well.   To learn more about what you can do with MyChart, go to [facility].    Your next appointment:   3 month(s)  The format for your next appointment:   In Person  Provider:   [email or web address], MD

## 2020-07-10 NOTE — Assessment & Plan Note (Signed)
History of CABG x3 in 2006 with Myoview performed February 2021 which was low risk.  He does not complain of exertional chest pain.  I performed outpatient right left heart cath on him 06/28/2020 revealing patent grafts and normal LV function.  There are no culprit lesion lesions identified.  His filling pressures were normal.

## 2020-07-10 NOTE — Assessment & Plan Note (Addendum)
History of hyperlipidemia with statin intolerance.  He was previously on Praluent however he has not taken it since October because of financial issues.  We will recheck a lipid liver profile this morning.  Lipid profile performed 09/27/2019 revealed total cholesterol 71 with an LDL of less than 10.  The PCSK9 however appeared to be working.

## 2020-07-10 NOTE — Assessment & Plan Note (Signed)
History of peripheral arterial disease with a known occluded right SFA and recent Dopplers that showed left popliteal artery occlusion.  He has had multiple procedures on his left SFA popliteal artery.  I did do abdominal aortography bilateral iliac angiography during his recent cardiac catheterization 06/28/2020 revealing no obstructive disease in his iliac system.  I am going to begin him on Pletal 50 mg p.o. twice daily and will reassess efficacy in 3 months.

## 2020-07-10 NOTE — Assessment & Plan Note (Signed)
History of essential hypertension a blood pressure measured today 112/66.  He is on amlodipine and metoprolol.

## 2020-07-13 ENCOUNTER — Other Ambulatory Visit: Payer: Self-pay

## 2020-07-13 DIAGNOSIS — E785 Hyperlipidemia, unspecified: Secondary | ICD-10-CM

## 2020-07-16 ENCOUNTER — Other Ambulatory Visit: Payer: Self-pay | Admitting: Family Medicine

## 2020-07-16 ENCOUNTER — Other Ambulatory Visit: Payer: Self-pay

## 2020-07-16 ENCOUNTER — Ambulatory Visit
Admission: RE | Admit: 2020-07-16 | Discharge: 2020-07-16 | Disposition: A | Discharge status: Home / Self Care | Payer: Medicare HMO | Source: Ambulatory Visit | Attending: Family Medicine | Admitting: Family Medicine

## 2020-07-16 DIAGNOSIS — R062 Wheezing: Secondary | ICD-10-CM

## 2020-07-22 ENCOUNTER — Other Ambulatory Visit: Payer: Self-pay | Admitting: Cardiology

## 2020-08-02 ENCOUNTER — Ambulatory Visit (INDEPENDENT_AMBULATORY_CARE_PROVIDER_SITE_OTHER): Payer: Medicare HMO | Admitting: Pharmacist

## 2020-08-02 ENCOUNTER — Other Ambulatory Visit: Payer: Self-pay

## 2020-08-02 VITALS — BP 112/58 | HR 66 | Resp 16 | Ht 73.0 in | Wt 228.0 lb

## 2020-08-02 DIAGNOSIS — E785 Hyperlipidemia, unspecified: Secondary | ICD-10-CM | POA: Diagnosis not present

## 2020-08-02 DIAGNOSIS — T466X5A Adverse effect of antihyperlipidemic and antiarteriosclerotic drugs, initial encounter: Secondary | ICD-10-CM

## 2020-08-02 DIAGNOSIS — M791 Myalgia, unspecified site: Secondary | ICD-10-CM

## 2020-08-02 DIAGNOSIS — Z951 Presence of aortocoronary bypass graft: Secondary | ICD-10-CM

## 2020-08-02 MED ORDER — PRALUENT 150 MG/ML ~~LOC~~ SOAJ: 150.0000 mg | SUBCUTANEOUS | 11 refills | Status: DC

## 2020-08-02 NOTE — Progress Notes (Signed)
Patient ID: [name]                 DOB: 16-Nov-1940                    MRN: [id number]     HPI: [name] is a 80 y.o. male patient referred to lipid clinic by Dr Allyson Sabal. PMH is significant for HTN, PVD, HLD and hx of CABG.   Patient was previously on Praluent 150mg  in 2021 and had reached goal LDL of 33 in November 2021 however patient assistance grant ran out and patient could no longer afford.  LDL increased to 121 and patient was referred back to lipid clinic.  Current Medications: Fish oil  Intolerances: Rosuvastatin, Atorvastatin Risk Factors: HTN, HLD LDL goal: <55  Labs: TC 176, HDL 40, Trigs 78, LDL 121 (07/10/20 - not on any meds)  Past Medical History:  Diagnosis Date  . Claudication (HCC) 06/27/2011   Left ABIs abnormal values with mild-moderate arterial insufficiency, right SFA moderate amount of mixed density plaque elevating velocities suggestive 50-69% diameter reduction  . Coronary artery disease 06/27/2011   Normal Lexiscan, EF 64, post-stress EF 64, no EKG changes  . Diabetes mellitus without complication (HCC)    prediabetic  . Hyperlipidemia   . Hypertension   . Impotence of organic origin   . Osteoarthritis   . Other testicular hypofunction   . PVD (peripheral vascular disease) (HCC)     Current Outpatient Medications on File Prior to Visit  Medication Sig Dispense Refill  . Alirocumab (PRALUENT) 150 MG/ML SOAJ Inject 150 mg into the skin every 14 (fourteen) days. 2 pen 11  . amLODipine (NORVASC) 5 MG tablet TAKE 1 TABLET BY MOUTH EVERY DAY 90 tablet 3  . aspirin 81 MG tablet Take 81 mg by mouth at bedtime.     . cilostazol (PLETAL) 50 MG tablet Take 1 tablet (50 mg total) by mouth 2 (two) times daily. 180 tablet 4  . clopidogrel (PLAVIX) 75 MG tablet TAKE 1 TABLET BY MOUTH DAILY WITH BREAKFAST. (Patient taking differently: Take 75 mg by mouth daily.) 90 tablet 2  . Coenzyme Q10-Fish Oil-Vit E (CO-Q 10 OMEGA-3 FISH OIL) CAPS Take 1 tablet once a day  (Patient taking differently: Take 1 capsule by mouth daily. 200 mg) 30 capsule 0  . isosorbide mononitrate (IMDUR) 30 MG 24 hr tablet Take 1 tablet (30 mg total) by mouth daily. 90 tablet 3  . metoprolol (LOPRESSOR) 50 MG tablet Take 730 mg by mouth daily.    . Omega-3 Fatty Acids (FISH OIL) 1200 MG CAPS Take 2 capsules (2,400 mg total) by mouth every morning. (Patient taking differently: Take 2,400 mg by mouth every morning.)    . Vitamin D, Ergocalciferol, (DRISDOL) 50000 UNITS CAPS capsule Take 50,000 Units by mouth every Sunday.      Current Facility-Administered Medications on File Prior to Visit  Medication Dose Route Frequency Provider Last Rate Last Admin  . sodium chloride flush (NS) 0.9 % injection 3 mL  3 mL Intravenous Q12H Monday, MD        Allergies  Allergen Reactions  . Crestor [Rosuvastatin] Other (See Comments)    Myalgias  . Lisinopril     cough    Assessment/Plan:  1. Hyperlipidemia - Patient LDL 121 which is above goal of <70.  Patient willing to restart Praluent.  Patient approved for [name].  Will send to pharmacy and recheck lipid panel in 2-3 months.  Patient voiced understanding.  Start Praluent 150mg  SQ Q14D Recheck lipid panel 2-3 months   , PharmD, BCACP, CDCES, CPP [facility] [address], [place], [place] Phone: [phone]; Fax: [phone] 08/02/2020 3:32 PM

## 2020-08-02 NOTE — Patient Instructions (Addendum)
It was good seeing you!  We would like your LDL (bad cholesterol) to be less than 55  We will restart the Praluent 150mg  once every 2 weeks  We will recheck your cholesterol in May when you see Dr. June  Please call with any questions  Allyson Sabal, PharmD, BCACP, CDCES, CPP [facility] [address] [place], [place] Phone: [phone]; Fax: [phone] 08/02/2020 10:22 AM

## 2020-09-03 ENCOUNTER — Telehealth: Payer: Self-pay | Admitting: Cardiovascular Disease

## 2020-09-03 MED ORDER — AMLODIPINE BESYLATE 10 MG PO TABS: 10.0000 mg | ORAL_TABLET | Freq: Every day | ORAL | 3 refills | Status: DC

## 2020-09-03 MED ORDER — ISOSORBIDE MONONITRATE ER 60 MG PO TB24: 60.0000 mg | ORAL_TABLET | Freq: Every day | ORAL | 3 refills | Status: DC

## 2020-09-03 NOTE — Telephone Encounter (Signed)
Pt c/o of Chest Pain: STAT if CP now or developed within 24 hours  1. Are you having CP right now? He denies CP right now.  He had 3 episodes since last Friday .   1st last Friday afternoon  2nd Saturday afternoon 3rd Sunday Morning   2. Are you experiencing any other symptoms (ex. SOB, nausea, vomiting, sweating)? When he has the "episode" he has has SOB.  Pt denies any other symptoms listed   3. How long have you been experiencing CP? Since Friday   4. Is your CP continuous or coming and going? While in the "Episode" it is Continuous pain   5. Have you taken Nitroglycerin? No   Best number - [phone]  ?  Bp Friday  180/98 BP on Saturday 140/90 BP on Sunday 160/98 Bp this morning 161/90

## 2020-09-03 NOTE — Telephone Encounter (Signed)
Spoke with Dr. Allyson Sabal on the phone regarding medication recommendations for pt. Spoke to pt on the phone about changes in medication per Dr. Allyson Sabal. Per Dr. Allyson Sabal pt's cath does not give an indication to why pt is hurting in his chest. Dr. Allyson Sabal would like to increase amlodipine to 10mg  daily and increase isosorbide mononitrate to 60mg  daily. Pt is to keep a blood pressure log until his next office visit scheduled for 10/09/20. Pt will bring blood pressure log to OV to discuss if medication changes improved his chest pain and increased blood pressure. Pt verbalizes understanding.

## 2020-09-03 NOTE — Telephone Encounter (Signed)
Spoke with pt on the phone regarding chest pain episodes that have been occurring for the last 3 days, pt is not currently having chest pain at this time. Pt states that he is usually working in the yard when his chest pain episodes start, pt says that he has shortness of breath with these chest pain episodes. Pt does not have SL nitroglycerin at home. Pt states that he is taking all medication as prescribed, including isosorbide mononitrate 30mg  daily. Pt has not taken his blood pressure during an episode but states that it has been somewhat elevated in the past few days. Pt did not write down his blood pressure from the last few day but states it is around 190/90.   Per chart pt had CABG in 2006. Pt had a right and left heart cath on 06/28/20.  Per cath report:  IMPRESSION: Mr. Hane had a left dominant system with a 90% distal left main, occluded LAD and circumflex, patent vein to the PDA on the left, patent vein to an OM branch and patent LIMA to the LAD.  He had normal LV function and normal filling pressures.  There is no "culprit lesion" that would explain his chest pain.  It sounds like this was the same reason that pt was seen and cathed back in February.

## 2020-10-09 ENCOUNTER — Ambulatory Visit: Payer: Medicare HMO | Admitting: Cardiovascular Disease

## 2020-10-09 ENCOUNTER — Encounter: Payer: Self-pay | Admitting: Cardiovascular Disease

## 2020-10-09 ENCOUNTER — Other Ambulatory Visit: Payer: Self-pay

## 2020-10-09 DIAGNOSIS — Z951 Presence of aortocoronary bypass graft: Secondary | ICD-10-CM | POA: Diagnosis not present

## 2020-10-09 DIAGNOSIS — I10 Essential (primary) hypertension: Secondary | ICD-10-CM

## 2020-10-09 DIAGNOSIS — E785 Hyperlipidemia, unspecified: Secondary | ICD-10-CM | POA: Diagnosis not present

## 2020-10-09 DIAGNOSIS — I739 Peripheral vascular disease, unspecified: Secondary | ICD-10-CM

## 2020-10-09 LAB — HEPATIC FUNCTION PANEL
ALT: 34 IU/L (ref 0–44)
AST: 30 IU/L (ref 0–40)
Albumin: 4.2 g/dL (ref 3.7–4.7)
Alkaline Phosphatase: 78 IU/L (ref 44–121)
Bilirubin Total: 0.8 mg/dL (ref 0.0–1.2)
Bilirubin, Direct: 0.32 mg/dL (ref 0.00–0.40)
Total Protein: 6.6 g/dL (ref 6.0–8.5)

## 2020-10-09 LAB — LIPID PANEL
Chol/HDL Ratio: 1.7 ratio (ref 0.0–5.0)
Cholesterol, Total: 72 mg/dL — ABNORMAL LOW (ref 100–199)
HDL: 42 mg/dL (ref >39)
LDL Chol Calc (NIH): 15 mg/dL (ref 0–99)
Triglycerides: 64 mg/dL (ref 0–149)
VLDL Cholesterol Cal: 15 mg/dL (ref 5–40)

## 2020-10-09 MED ORDER — CLOPIDOGREL BISULFATE 75 MG PO TABS: 75.0000 mg | ORAL_TABLET | Freq: Every day | ORAL | 3 refills | Status: DC

## 2020-10-09 NOTE — Assessment & Plan Note (Signed)
History of dyslipidemia on Praluent with lipid profile performed 07/10/2020 revealing total cholesterol 176, LDL 121 and HDL 40.  He is taking his Praluent now which she was not in the past because of financial constraints.  I am going to check a fasting lipid liver profile today.

## 2020-10-09 NOTE — Assessment & Plan Note (Signed)
History of essential hypertension a blood pressure measured today at 112/56.  Blood pressure has been better controlled recently with the up titration of his metoprolol.  He is on amlodipine and metoprolol.

## 2020-10-09 NOTE — Patient Instructions (Signed)
Medication Instructions:  Your physician recommends that you continue on your current medications as directed. Please refer to the Current Medication list given to you today.  *If you need a refill on your cardiac medications before your next appointment, please call your pharmacy*   Lab Work: Your physician recommends that you have labs drawn today: lipid/liver profile  If you have labs (blood work) drawn today and your tests are completely normal, you will receive your results only by: Marland Kitchen MyChart Message (if you have MyChart) OR . A paper copy in the mail If you have any lab test that is abnormal or we need to change your treatment, we will call you to review the results.   Follow-Up: At [facility], you and your health needs are our priority.  As part of our continuing mission to provide you with exceptional heart care, we have created designated Provider Care Teams.  These Care Teams include your primary Cardiologist (physician) and Advanced Practice Providers (APPs -  Physician Assistants and Nurse Practitioners) who all work together to provide you with the care you need, when you need it.  We recommend signing up for the patient portal called "MyChart".  Sign up information is provided on this After Visit Summary.  MyChart is used to connect with patients for Virtual Visits (Telemedicine).  Patients are able to view lab/test results, encounter notes, upcoming appointments, etc.  Non-urgent messages can be sent to your provider as well.   To learn more about what you can do with MyChart, go to [email or web address].    Your next appointment:   6 weeks  The format for your next appointment:   In Person  Provider:   Nanetta Batty, MD

## 2020-10-09 NOTE — Progress Notes (Signed)
10/09/2020 [name]   Feb 23, 1941  [id number]  Primary Physician [name], MD Primary Cardiologist: [name] MD FACP, [place], [place], [place]  HPI:  [name] is a 80 y.o.  mildly overweight married Caucasian male whoI last saw him in the office 07/11/2020.Marland KitchenHe is accompanied by his wife [name] today. 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 [name] December 27, 2004, for left main 3-vessel disease. He had a LIMA to his LAD, a vein to his circumflex marginal branch as well as to the PDA. His other problems include hypertension and hyperlipidemia. He denies chest pain or shortness of breath. He does have left ABI in the 0.85range with a high-frequency signal in his left SFA by duplex ultrasound 10/20/12 but did not claudication at that time.His last Myoview performed 06/27/11 was nonischemic.4 years ago he developed lifestyle limiting bilateral lower extremity claudication. Should be noted that he did stop smoking at the time of his bypass surgery in 2006. He underwent peripheral angiography by myself 08/04/16 revealing a long segment calcified occlusion right SFA and high-grade diffuse calcified left SFA stenosis. I did not think that he could be percutaneously revascularized on the right,and therefore opted for medical therapy.This Pletal has been titrated to 100 mg by mouth twice a day without significant improvement in his claudication symptoms. These are lifestyle limiting. He denies chest pain or shortness of breath. I performed peripheral angiography and endovascular therapy of his diffusely calcified and highly obstructive left SFA using diamondback orbital rotational atherectomy followed by drug-eluting balloon plasty. This left ABI increased from 0.63-.85 and his claudication has significantly improved and were resolved.  He  had progressive upper  extremity effort angina. He did have a negative Myoview 07/14/2019. He saw Corine Shelter, PA-C in the office 04/27/2020. His Imdur was increased but this is not affected the frequency and/or severity of his symptoms. He also complains of claudication bilaterally. He has a known occluded right SFA from prior angiography status post orbital atherectomy, PTA of his subtotally occluded calcified left SFA 04/13/2017. Recent Dopplers performed 04/26/2020 revealed a left ABI of 0.52 with an occluded left popliteal artery.  I performed right left heart cath on him 06/28/2020 revealing a left dominant system, normal LV function, patent vein to the distal left PDA, LM branch and LIMA to the LAD.  He did have a 90% apical LAD stenosis after LIMA insertion.  His hemodynamics are stable.  He says he feels better since the heart cath although I did not do an intervention.  I also performed abdominal aortography and bilateral iliac angiography revealing no obstructive disease.  He does complain of bilateral calf claudication.  The etiology of his chest pain cannot be explained by the findings on his heart cath I have reassured him and his wife [name].  Since I saw him in the office 3 months ago his chest pain is resolved.  Does complain of left greater than right lower extremity claudication however.  He had Doppler studies performed 04/26/2020 that showed a decline in his left ABI with what appears to be an occluded left popliteal artery.  He still works and is fairly symptomatic from this.  We did try him on cilostazol which resulted in no clinical benefit.   Current Meds  Medication Sig  . Alirocumab (PRALUENT) 150 MG/ML SOAJ Inject 150 mg into the skin  every 14 (fourteen) days.  Marland Kitchen amLODipine (NORVASC) 10 MG tablet Take 1 tablet (10 mg total) by mouth daily.  Marland Kitchen aspirin 81 MG tablet Take 81 mg by mouth at bedtime.   . cilostazol (PLETAL) 50 MG tablet Take 1 tablet (50 mg total) by mouth 2 (two) times daily.  .  clopidogrel (PLAVIX) 75 MG tablet TAKE 1 TABLET BY MOUTH DAILY WITH BREAKFAST. (Patient taking differently: Take 75 mg by mouth daily.)  . Coenzyme Q10-Fish Oil-Vit E (CO-Q 10 OMEGA-3 FISH OIL) CAPS Take 1 tablet once a day (Patient taking differently: Take 1 capsule by mouth daily. 200 mg)  . isosorbide mononitrate (IMDUR) 60 MG 24 hr tablet Take 1 tablet (60 mg total) by mouth daily.  . metoprolol tartrate (LOPRESSOR) 50 MG tablet Take 50 mg by mouth daily.  . Omega-3 Fatty Acids (FISH OIL) 1200 MG CAPS Take 2 capsules (2,400 mg total) by mouth every morning. (Patient taking differently: Take 2,400 mg by mouth every morning.)  . Vitamin D, Ergocalciferol, (DRISDOL) 50000 UNITS CAPS capsule Take 50,000 Units by mouth every Sunday.   . [DISCONTINUED] metoprolol (LOPRESSOR) 50 MG tablet Take 730 mg by mouth daily.     Allergies  Allergen Reactions  . Crestor [Rosuvastatin] Other (See Comments)    Myalgias  . Lipitor [Atorvastatin]     myalgias  . Lisinopril     cough    Social History   Socioeconomic History  . Marital status: Married    Spouse name: cathy  . Number of children: 1  . Years of education: 37  . Highest education level: Not on file  Occupational History  . Occupation: retired  Tobacco Use  . Smoking status: Former Smoker    Packs/day: 1.00    Years: 50.00    Pack years: 50.00    Types: Cigarettes    Quit date: 12/17/2004    Years since quitting: 15.8  . Smokeless tobacco: Never Used  Vaping Use  . Vaping Use: Never used  Substance and Sexual Activity  . Alcohol use: Yes    Comment: occasionally  . Drug use: No  . Sexual activity: Not on file  Other Topics Concern  . Not on file  Social History Narrative   Lives with wife   Caffeine - coffee, 1 cup daily   Social Determinants of Health   Financial Resource Strain: Not on file  Food Insecurity: Not on file  Transportation Needs: Not on file  Physical Activity: Not on file  Stress: Not on file  Social  Connections: Not on file  Intimate Partner Violence: Not on file     Review of Systems: General: negative for chills, fever, night sweats or weight changes.  Cardiovascular: negative for chest pain, dyspnea on exertion, edema, orthopnea, palpitations, paroxysmal nocturnal dyspnea or shortness of breath Dermatological: negative for rash Respiratory: negative for cough or wheezing Urologic: negative for hematuria Abdominal: negative for nausea, vomiting, diarrhea, bright red blood per rectum, melena, or hematemesis Neurologic: negative for visual changes, syncope, or dizziness All other systems reviewed and are otherwise negative except as noted above.    Blood pressure (!) 112/56, pulse 68, height 6\' 1"  (1.854 m), weight 221 lb (100.2 kg).  General appearance: alert and no distress Neck: no adenopathy, no carotid bruit, no JVD, supple, symmetrical, trachea midline and thyroid not enlarged, symmetric, no tenderness/mass/nodules Lungs: clear to auscultation bilaterally Heart: regular rate and rhythm, S1, S2 normal, no murmur, click, rub or gallop Extremities: extremities normal, atraumatic, no  cyanosis or edema Pulses: 2+ and symmetric Diminished pedal pulses. Skin: Skin color, texture, turgor normal. No rashes or lesions Neurologic: Alert and oriented X 3, normal strength and tone. Normal symmetric reflexes. Normal coordination and gait  EKG sinus rhythm at 68 without ST or T wave changes.  Personally reviewed this EKG.  ASSESSMENT AND PLAN:   Hx of CABG 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  He had a LIMA to his LAD, vein to the circumflex marginal branch as well as the PDA.  I recast him 06/28/2020 revealing a patent vein to the distal PDA, patent LIMA to the LAD and patent vein to an obtuse marginal branch.  He did have a apical LAD disease beyond LIMA insertion.  His chest pain has resolved.  HTN (hypertension) History of  essential hypertension a blood pressure measured today at 112/56.  Blood pressure has been better controlled recently with the up titration of his metoprolol.  He is on amlodipine and metoprolol.  Dyslipidemia History of dyslipidemia on Praluent with lipid profile performed 07/10/2020 revealing total cholesterol 176, LDL 121 and HDL 40.  He is taking his Praluent now which she was not in the past because of financial constraints.  I am going to check a fasting lipid liver profile today.  Peripheral vascular disease (HCC) History of peripheral arterial disease status post left SFA orbital atherectomy back in 2018.  His right SFA was demonstrated to be occluded from the origin down to the adductor canal without percutaneous options.  Doppler studies performed this past December showed decline in his left ABI with what appears to be an occluded left popliteal artery.  He was on Pletal nonresponder.  He does have lifestyle limiting claudication left greater than right and wishes to have this percutaneously addressed.      [name] MD FACP,FACC,FAHA, [facility] 10/09/2020 8:46 AM

## 2020-10-09 NOTE — Assessment & Plan Note (Signed)
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  He had a LIMA to his LAD, vein to the circumflex marginal branch as well as the PDA.  I recast him 06/28/2020 revealing a patent vein to the distal PDA, patent LIMA to the LAD and patent vein to an obtuse marginal branch.  He did have a apical LAD disease beyond LIMA insertion.  His chest pain has resolved.

## 2020-10-09 NOTE — Assessment & Plan Note (Signed)
History of peripheral arterial disease status post left SFA orbital atherectomy back in 2018.  His right SFA was demonstrated to be occluded from the origin down to the adductor canal without percutaneous options.  Doppler studies performed this past December showed decline in his left ABI with what appears to be an occluded left popliteal artery.  He was on Pletal nonresponder.  He does have lifestyle limiting claudication left greater than right and wishes to have this percutaneously addressed.

## 2020-10-11 ENCOUNTER — Telehealth: Payer: Self-pay | Admitting: Cardiovascular Disease

## 2020-10-11 NOTE — Telephone Encounter (Signed)
Everardo All, RN 10:15 AM Per Dr. Allyson Sabal, he is aware and is comfortable with pt being on both medications. Thank you! KG Spoke with the pharmacy pharmacist, she states that she was just verifying that pt was taking the medications together, the pt misunderstood. They will fill and notify pt when ready. Pt wife (DPR) informed of providers result & recommendations. verbalized understanding. No further questions .

## 2020-10-11 NOTE — Telephone Encounter (Signed)
Pt c/o medication issue:  1. Name of Medications:  clopidogrel (PLAVIX) 75 MG tablet cilostazol (PLETAL) 50 MG tablet  2. How are you currently taking this medication (dosage and times per day)? Patient has not started plavix yet but is still taking pletal  3. Are you having a reaction (difficulty breathing--STAT)?   4. What is your medication issue? The patient went to the pharmacy yesterday to pick up the plavix. The pharmacist advised him that both medications are blood thinners and he should check with Dr. Allyson Sabal to make sure it is safe for him to take both.

## 2020-11-20 ENCOUNTER — Encounter: Payer: Self-pay | Admitting: Cardiovascular Disease

## 2020-11-20 ENCOUNTER — Ambulatory Visit: Payer: Medicare HMO | Admitting: Cardiovascular Disease

## 2020-11-20 ENCOUNTER — Other Ambulatory Visit: Payer: Self-pay

## 2020-11-20 VITALS — BP 116/58 | HR 72 | Ht 73.0 in | Wt 216.0 lb

## 2020-11-20 DIAGNOSIS — Z01812 Encounter for preprocedural laboratory examination: Secondary | ICD-10-CM | POA: Diagnosis not present

## 2020-11-20 DIAGNOSIS — I739 Peripheral vascular disease, unspecified: Secondary | ICD-10-CM

## 2020-11-20 NOTE — Patient Instructions (Addendum)
Medication Instructions:  STOP: PLETAL *If you need a refill on your cardiac medications before your next appointment, please call your pharmacy*  Lab Work: LABS ON Thursday 11/29/20- CBC AND BMET FOR PROCEDURE If you have labs (blood work) drawn today and your tests are completely normal, you will receive your results only by: MyChart Message (if you have MyChart) OR A paper copy in the mail If you have any lab test that is abnormal or we need to change your treatment, we will call you to review the results.  Testing/Procedures: Your physician has requested that you have an ankle brachial index (ABI) 1 WEEK AFTER PROCEDURE. During this test an ultrasound and blood pressure cuff are used to evaluate the arteries that supply the arms and legs with blood. Allow thirty minutes for this exam. There are no restrictions or special instructions. This will take place at [address] [facility], [address].   Your physician has requested that you have a lower extremity arterial duplex 1 WEEK AFTER PROCEDURE. During this test, ultrasound is used to evaluate arterial blood flow in the legs. Allow one hour for this exam. There are no restrictions or special instructions. This will take place at [address] [facility], [address].   PV PROCEDURE- PLEASE SEE INSTRUCTION SHEET ATTACHED   Follow-Up: At [facility], you and your health needs are our priority.  As part of our continuing mission to provide you with exceptional heart care, we have created designated Provider Care Teams.  These Care Teams include your primary Cardiologist (physician) and Advanced Practice Providers (APPs -  Physician Assistants and Nurse Practitioners) who all work together to provide you with the care you need, when you need it.  We recommend signing up for the patient portal called "MyChart".  Sign up information is provided on this After Visit Summary.  MyChart is used to connect with patients for Virtual Visits (Telemedicine).  Patients  are able to view lab/test results, encounter notes, upcoming appointments, etc.  Non-urgent messages can be sent to your provider as well.   To learn more about what you can do with MyChart, go to [email or web address].    Your next appointment:   2-3 week(s) POST PROCEDURE   The format for your next appointment:   In Person  Provider:   Nanetta Batty, MD  Other Instructions    [facility] CARDIOVASCULAR DIVISION [facility] [address] [place] [address] Dept: [phone] Loc: [phone]  Domonik Levario  11/20/2020  You are scheduled for a Peripheral Angiogram on Thursday, July 21st  with Dr. Nanetta Batty.  1. Please arrive at the [facility] (Main Entrance A) at [facility]: [address] [place], [place] [address] at 7:30 AM (This time is two hours before your procedure to ensure your preparation). Free valet parking service is available.   Special note: Every effort is made to have your procedure done on time. Please understand that emergencies sometimes delay scheduled procedures.  2. Diet: Do not eat solid foods after midnight.  The patient may have clear liquids until 5am upon the day of the procedure.  3. Labs: You will need to have blood drawn on Thursday, July 14 at [facility] [address], [place]  Open: 8am - 5pm (Lunch 12:30 - 1:30)   Phone: [phone]. You do not need to be fasting.  4. Medication instructions in preparation for your procedure:   Contrast Allergy: No  On the morning of your  procedure, take your Aspirin and Plavix/Clopidogrel and any morning medicines NOT listed above.  You may use sips of water.  5. Plan for one night stay--bring personal belongings. 6. Bring a current list of your medications and current insurance cards. 7. You MUST have a responsible person to drive you home. 8. Someone MUST be with you the first 24 hours after you arrive home or your  discharge will be delayed. 9. Please wear clothes that are easy to get on and off and wear slip-on shoes.  Thank you for allowing [place] to care for you!   -- [place]

## 2020-11-20 NOTE — Assessment & Plan Note (Signed)
[name] returns for follow-up of his PAD.  I performed orbital atherectomy and drug-coated balloon angioplasty on him 04/13/2017 for diffusely calcified left SFA.  He had three-vessel runoff.  His Dopplers improved after that as did his symptoms.  Over the last 6 months or so he has noticed increasing claudication on the left with some numbness in his left foot.  His last Doppler studies performed 04/26/2020 revealed a right ABI of 0.74 and a left of 0.52.  His left SFA is patent although his left popliteal is occluded.  He wishes to proceed with outpatient endovascular therapy for lifestyle limiting claudication.

## 2020-11-20 NOTE — Progress Notes (Signed)
11/20/2020 [name]   Oct 08, 1940  [id number]  Primary Physician Richard Puffer, MD Primary Cardiologist: Richard Gess MD FACP, [place], [place], [place]  HPI:  [name] is a 80 y.o.  mildly overweight married Caucasian male whoI last saw him in the office 10/09/2020.Marland Kitchen  He is accompanied by his wife [name] today.  He is the father of one child, grandfather and one grandchild. I last saw him in the office 06/26/2020. 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 [name] December 27, 2004, for left main 3-vessel disease. He had a LIMA to his LAD, a vein to his circumflex marginal branch as well as to the PDA. His other problems include hypertension and hyperlipidemia. He denies chest pain or shortness of breath. He does have left ABI in the 0.85range with a high-frequency signal in his left SFA by duplex ultrasound 10/20/12 but did not claudication at that time. His last Myoview performed 06/27/11 was nonischemic.4 years ago he developed lifestyle limiting bilateral lower extremity claudication. Should be noted that he did stop smoking at the time of his bypass surgery in 2006. He underwent peripheral angiography by myself 08/04/16 revealing a long segment calcified occlusion right SFA and high-grade diffuse calcified left SFA stenosis. I did not think that he could be percutaneously revascularized on the right, and therefore opted for medical therapy. This Pletal has been titrated to 100 mg by mouth twice a day without significant improvement in his claudication symptoms. These are lifestyle limiting. He denies chest pain or shortness of breath.  I performed peripheral angiography and endovascular therapy of his diffusely calcified and highly obstructive left SFA using diamondback orbital rotational atherectomy followed by drug-eluting balloon plasty. This left ABI increased from 0.63-.85 and his claudication has significantly improved and were resolved.    He  had progressive  upper extremity effort angina.  He did have a negative Myoview 07/14/2019.  He saw Corine Shelter, PA-C in the office 04/27/2020.  His Imdur was increased but this is not affected the frequency and/or severity of his symptoms.  He also complains of claudication bilaterally.  He has a known occluded right SFA from prior angiography status post orbital atherectomy, PTA of his subtotally occluded calcified left SFA 04/13/2017.  Recent Dopplers performed 04/26/2020 revealed a left ABI of 0.52 with an occluded left popliteal artery.   I performed right left heart cath on him 06/28/2020 revealing a left dominant system, normal LV function, patent vein to the distal left PDA, LM branch and LIMA to the LAD.  He did have a 90% apical LAD stenosis after LIMA insertion.  His hemodynamics are stable.  He says he feels better since the heart cath although I did not do an intervention.  I also performed abdominal aortography and bilateral iliac angiography revealing no obstructive disease.  He does complain of bilateral calf claudication.  The etiology of his chest pain cannot be explained by the findings on his heart cath I have reassured him and his wife [name].   Since I saw him in the office a month and a half ago, he still remained stable from a heart point of view and his chest pain has resolved.  He continues to complain of left lower extremity claudication and has failed Pletal.  His Doppler study suggested an occluded left popliteal artery.  He wishes to proceed with angiography.   Current Meds  Medication Sig   Alirocumab (PRALUENT) 150 MG/ML SOAJ Inject  150 mg into the skin every 14 (fourteen) days.   amLODipine (NORVASC) 10 MG tablet Take 1 tablet (10 mg total) by mouth daily.   aspirin 81 MG tablet Take 81 mg by mouth at bedtime.    cilostazol (PLETAL) 50 MG tablet Take 1 tablet (50 mg total) by mouth 2 (two) times daily.   clopidogrel (PLAVIX) 75 MG tablet Take 1 tablet (75 mg total) by mouth daily.    Coenzyme Q10-Fish Oil-Vit E (CO-Q 10 OMEGA-3 FISH OIL) CAPS Take 1 tablet once a day (Patient taking differently: Take 1 capsule by mouth daily. 200 mg)   isosorbide mononitrate (IMDUR) 60 MG 24 hr tablet Take 1 tablet (60 mg total) by mouth daily.   metoprolol tartrate (LOPRESSOR) 50 MG tablet Take 50 mg by mouth daily.   Omega-3 Fatty Acids (FISH OIL) 1200 MG CAPS Take 2 capsules (2,400 mg total) by mouth every morning. (Patient taking differently: Take 2,400 mg by mouth every morning.)   Vitamin D, Ergocalciferol, (DRISDOL) 50000 UNITS CAPS capsule Take 50,000 Units by mouth every Sunday.      Allergies  Allergen Reactions   Crestor [Rosuvastatin] Other (See Comments)    Myalgias   Lipitor [Atorvastatin]     myalgias   Lisinopril     cough    Social History   Socioeconomic History   Marital status: Married    Spouse name: cathy   Number of children: 1   Years of education: 12   Highest education level: Not on file  Occupational History   Occupation: retired  Tobacco Use   Smoking status: Former    Packs/day: 1.00    Years: 50.00    Pack years: 50.00    Types: Cigarettes    Quit date: 12/17/2004    Years since quitting: 15.9   Smokeless tobacco: Never  Vaping Use   Vaping Use: Never used  Substance and Sexual Activity   Alcohol use: Yes    Comment: occasionally   Drug use: No   Sexual activity: Not on file  Other Topics Concern   Not on file  Social History Narrative   Lives with wife   Caffeine - coffee, 1 cup daily   Social Determinants of Health   Financial Resource Strain: Not on file  Food Insecurity: Not on file  Transportation Needs: Not on file  Physical Activity: Not on file  Stress: Not on file  Social Connections: Not on file  Intimate Partner Violence: Not on file     Review of Systems: General: negative for chills, fever, night sweats or weight changes.  Cardiovascular: negative for chest pain, dyspnea on exertion, edema, orthopnea,  palpitations, paroxysmal nocturnal dyspnea or shortness of breath Dermatological: negative for rash Respiratory: negative for cough or wheezing Urologic: negative for hematuria Abdominal: negative for nausea, vomiting, diarrhea, bright red blood per rectum, melena, or hematemesis Neurologic: negative for visual changes, syncope, or dizziness All other systems reviewed and are otherwise negative except as noted above.    Blood pressure (!) 116/58, pulse 72, height 6\' 1"  (1.854 m), weight 216 lb (98 kg).  General appearance: alert and no distress Neck: no adenopathy, no carotid bruit, no JVD, supple, symmetrical, trachea midline, and thyroid not enlarged, symmetric, no tenderness/mass/nodules Lungs: clear to auscultation bilaterally Heart: regular rate and rhythm, S1, S2 normal, no murmur, click, rub or gallop Extremities: extremities normal, atraumatic, no cyanosis or edema Pulses: Diminished pedal pulses bilaterally Skin: Skin color, texture, turgor normal. No rashes or lesions Neurologic:  Grossly normal  EKG not performed today  ASSESSMENT AND PLAN:   Claudication in peripheral vascular disease [facility]) Mr. Littler returns for follow-up of his PAD.  I performed orbital atherectomy and drug-coated balloon angioplasty on him 04/13/2017 for diffusely calcified left SFA.  He had three-vessel runoff.  His Dopplers improved after that as did his symptoms.  Over the last 6 months or so he has noticed increasing claudication on the left with some numbness in his left foot.  His last Doppler studies performed 04/26/2020 revealed a right ABI of 0.74 and a left of 0.52.  His left SFA is patent although his left popliteal is occluded.  He wishes to proceed with outpatient endovascular therapy for lifestyle limiting claudication.     Richard Gess MD FACP,FACC,FAHA, [facility] 11/20/2020 11:12 AM

## 2020-11-28 LAB — BASIC METABOLIC PANEL
BUN/Creatinine Ratio: 15 (ref 10–24)
BUN: 12 mg/dL (ref 8–27)
CO2: 22 mmol/L (ref 20–29)
Calcium: 9.4 mg/dL (ref 8.6–10.2)
Chloride: 105 mmol/L (ref 96–106)
Creatinine, Ser: 0.81 mg/dL (ref 0.76–1.27)
Glucose: 133 mg/dL — ABNORMAL HIGH (ref 65–99)
Potassium: 4.3 mmol/L (ref 3.5–5.2)
Sodium: 143 mmol/L (ref 134–144)
eGFR: 90 mL/min/{1.73_m2} (ref >59)

## 2020-11-28 LAB — CBC
Hematocrit: 37.6 % (ref 37.5–51.0)
Hemoglobin: 12.7 g/dL — ABNORMAL LOW (ref 13.0–17.7)
MCH: 30 pg (ref 26.6–33.0)
MCHC: 33.8 g/dL (ref 31.5–35.7)
MCV: 89 fL (ref 79–97)
Platelets: 279 10*3/uL (ref 150–450)
RBC: 4.23 x10E6/uL (ref 4.14–5.80)
RDW: 13.3 % (ref 11.6–15.4)
WBC: 11.6 10*3/uL — ABNORMAL HIGH (ref 3.4–10.8)

## 2020-12-05 ENCOUNTER — Telehealth: Payer: Self-pay | Admitting: *Deleted

## 2020-12-05 NOTE — Telephone Encounter (Signed)
Pt contacted pre-abdomina aortogram  scheduled at [facility] for: Thursday December 06, 2020 9:30 AM Verified arrival time and place: [facility] Main Entrance A [facility]) at: 7:30 AM   No solid food after midnight prior to cath, clear liquids until 5 AM day of procedure.   AM meds can be  taken pre-cath with sips of water including: aspirin 81 mg Plavix 75 mg  Confirmed patient has responsible adult to drive home post procedure and be with patient first 24 hours after arriving home: yes  You are allowed ONE visitor in the waiting room during the time you are at the hospital for your procedure. Both you and your visitor must wear a mask once you enter the hospital.   Patient reports does not currently have any symptoms concerning for COVID-19 and no household members with COVID-19 like illness.     Reviewed procedure/mask/visitor instructions with patient.

## 2020-12-06 ENCOUNTER — Other Ambulatory Visit: Payer: Self-pay

## 2020-12-06 ENCOUNTER — Encounter (HOSPITAL_COMMUNITY): Payer: Self-pay | Admitting: Cardiovascular Disease

## 2020-12-06 ENCOUNTER — Encounter (HOSPITAL_COMMUNITY)
Admission: RE | Disposition: A | Discharge status: Home / Self Care | Payer: Self-pay | Source: Home / Self Care | Attending: Cardiovascular Disease

## 2020-12-06 ENCOUNTER — Ambulatory Visit (HOSPITAL_COMMUNITY)
Admission: RE | Admit: 2020-12-06 | Discharge: 2020-12-07 | Disposition: A | Discharge status: Home / Self Care | Payer: Medicare HMO | Attending: Cardiovascular Disease | Admitting: Cardiovascular Disease

## 2020-12-06 DIAGNOSIS — I70212 Atherosclerosis of native arteries of extremities with intermittent claudication, left leg: Secondary | ICD-10-CM

## 2020-12-06 DIAGNOSIS — Z79899 Other long term (current) drug therapy: Secondary | ICD-10-CM | POA: Diagnosis not present

## 2020-12-06 DIAGNOSIS — I10 Essential (primary) hypertension: Secondary | ICD-10-CM | POA: Insufficient documentation

## 2020-12-06 DIAGNOSIS — Z888 Allergy status to other drugs, medicaments and biological substances status: Secondary | ICD-10-CM | POA: Diagnosis not present

## 2020-12-06 DIAGNOSIS — Z7982 Long term (current) use of aspirin: Secondary | ICD-10-CM | POA: Insufficient documentation

## 2020-12-06 DIAGNOSIS — E785 Hyperlipidemia, unspecified: Secondary | ICD-10-CM | POA: Diagnosis not present

## 2020-12-06 DIAGNOSIS — Z951 Presence of aortocoronary bypass graft: Secondary | ICD-10-CM | POA: Diagnosis not present

## 2020-12-06 DIAGNOSIS — Z7902 Long term (current) use of antithrombotics/antiplatelets: Secondary | ICD-10-CM | POA: Diagnosis not present

## 2020-12-06 DIAGNOSIS — I25119 Atherosclerotic heart disease of native coronary artery with unspecified angina pectoris: Secondary | ICD-10-CM | POA: Insufficient documentation

## 2020-12-06 DIAGNOSIS — I708 Atherosclerosis of other arteries: Secondary | ICD-10-CM | POA: Insufficient documentation

## 2020-12-06 DIAGNOSIS — I739 Peripheral vascular disease, unspecified: Secondary | ICD-10-CM | POA: Diagnosis present

## 2020-12-06 HISTORY — PX: PERIPHERAL VASCULAR INTERVENTION: CATH118257

## 2020-12-06 HISTORY — PX: LOWER EXTREMITY ANGIOGRAPHY: CATH118251

## 2020-12-06 HISTORY — PX: PERIPHERAL VASCULAR ATHERECTOMY: CATH118256

## 2020-12-06 LAB — POCT ACTIVATED CLOTTING TIME
Activated Clotting Time: 248 seconds
Activated Clotting Time: 248 seconds
Activated Clotting Time: 254 seconds
Activated Clotting Time: 277 seconds
Activated Clotting Time: 254 seconds
Activated Clotting Time: 277 seconds
Activated Clotting Time: 144 seconds

## 2020-12-06 LAB — GLUCOSE, CAPILLARY
Glucose-Capillary: 122 mg/dL — ABNORMAL HIGH (ref 70–99)
Glucose-Capillary: 99 mg/dL (ref 70–99)

## 2020-12-06 SURGERY — LOWER EXTREMITY ANGIOGRAPHY
Anesthesia: LOCAL | Laterality: Bilateral

## 2020-12-06 MED ORDER — SODIUM CHLORIDE 0.9 % IV SOLN: 250.0000 mL | INTRAVENOUS | Status: DC | PRN

## 2020-12-06 MED ORDER — SODIUM CHLORIDE 0.9% FLUSH: 3.0000 mL | INTRAVENOUS | Status: DC | PRN

## 2020-12-06 MED ORDER — SODIUM CHLORIDE 0.9% FLUSH
3.0000 mL | Freq: Two times a day (BID) | INTRAVENOUS | Status: DC
  Administered 2020-12-06: 3 mL via INTRAVENOUS

## 2020-12-06 MED ORDER — SODIUM CHLORIDE 0.9 % IV SOLN: INTRAVENOUS | Status: AC

## 2020-12-06 MED ORDER — VIPERSLIDE LUBRICANT OPTIME
TOPICAL | Status: DC | PRN
  Administered 2020-12-06: 300 mL via SURGICAL_CAVITY

## 2020-12-06 MED ORDER — HYDRALAZINE HCL 20 MG/ML IJ SOLN
5.0000 mg | INTRAMUSCULAR | Status: DC | PRN
Start: 2020-12-06 — End: 2020-12-07

## 2020-12-06 MED ORDER — AMLODIPINE BESYLATE 10 MG PO TABS
10.0000 mg | ORAL_TABLET | Freq: Every day | ORAL | Status: DC
  Administered 2020-12-07: 10 mg via ORAL
  Filled 2020-12-06: qty 1

## 2020-12-06 MED ORDER — LIDOCAINE HCL (PF) 1 % IJ SOLN
INTRAMUSCULAR | Status: AC
  Filled 2020-12-06: qty 30

## 2020-12-06 MED ORDER — MIDAZOLAM HCL 2 MG/2ML IJ SOLN
INTRAMUSCULAR | Status: AC
  Filled 2020-12-06: qty 2

## 2020-12-06 MED ORDER — LABETALOL HCL 5 MG/ML IV SOLN: 10.0000 mg | INTRAVENOUS | Status: DC | PRN

## 2020-12-06 MED ORDER — HEPARIN SODIUM (PORCINE) 1000 UNIT/ML IJ SOLN
INTRAMUSCULAR | Status: DC | PRN
  Administered 2020-12-06: 2500 [IU] via INTRAVENOUS
  Administered 2020-12-06: 10000 [IU] via INTRAVENOUS
  Administered 2020-12-06: 4000 [IU] via INTRAVENOUS
  Administered 2020-12-06: 3000 [IU] via INTRAVENOUS

## 2020-12-06 MED ORDER — SODIUM CHLORIDE 0.9 % WEIGHT BASED INFUSION: 1.0000 mL/kg/h | INTRAVENOUS | Status: DC

## 2020-12-06 MED ORDER — ASPIRIN 81 MG PO TABS: 81.0000 mg | ORAL_TABLET | Freq: Every day | ORAL | Status: DC

## 2020-12-06 MED ORDER — FENTANYL CITRATE (PF) 100 MCG/2ML IJ SOLN
INTRAMUSCULAR | Status: DC | PRN
  Administered 2020-12-06 (×2): 25 ug via INTRAVENOUS

## 2020-12-06 MED ORDER — VERAPAMIL HCL 2.5 MG/ML IV SOLN
INTRAVENOUS | Status: AC
  Filled 2020-12-06: qty 2

## 2020-12-06 MED ORDER — CLOPIDOGREL BISULFATE 75 MG PO TABS: 75.0000 mg | ORAL_TABLET | Freq: Every day | ORAL | Status: DC

## 2020-12-06 MED ORDER — CLOPIDOGREL BISULFATE 75 MG PO TABS: 75.0000 mg | ORAL_TABLET | ORAL | Status: DC

## 2020-12-06 MED ORDER — ASPIRIN EC 81 MG PO TBEC
81.0000 mg | DELAYED_RELEASE_TABLET | Freq: Every day | ORAL | Status: DC
  Administered 2020-12-07: 81 mg via ORAL
  Filled 2020-12-06: qty 1

## 2020-12-06 MED ORDER — ASPIRIN 81 MG PO CHEW: 81.0000 mg | CHEWABLE_TABLET | ORAL | Status: DC

## 2020-12-06 MED ORDER — NITROGLYCERIN IN D5W 200-5 MCG/ML-% IV SOLN
INTRAVENOUS | Status: AC
  Filled 2020-12-06: qty 250

## 2020-12-06 MED ORDER — NITROGLYCERIN 1 MG/10 ML FOR IR/CATH LAB
INTRA_ARTERIAL | Status: DC | PRN
  Administered 2020-12-06: 200 ug via INTRA_ARTERIAL

## 2020-12-06 MED ORDER — METOPROLOL TARTRATE 50 MG PO TABS
50.0000 mg | ORAL_TABLET | Freq: Every day | ORAL | Status: DC
  Administered 2020-12-07: 50 mg via ORAL
  Filled 2020-12-06: qty 1

## 2020-12-06 MED ORDER — SODIUM CHLORIDE 0.9 % WEIGHT BASED INFUSION
3.0000 mL/kg/h | INTRAVENOUS | Status: DC
  Administered 2020-12-06: 3 mL/kg/h via INTRAVENOUS

## 2020-12-06 MED ORDER — HEPARIN (PORCINE) IN NACL 1000-0.9 UT/500ML-% IV SOLN
INTRAVENOUS | Status: DC | PRN
Start: 2020-12-06 — End: 2020-12-06
  Administered 2020-12-06: 500 mL

## 2020-12-06 MED ORDER — ALIROCUMAB 150 MG/ML ~~LOC~~ SOAJ: 150.0000 mg | SUBCUTANEOUS | Status: DC

## 2020-12-06 MED ORDER — HEPARIN (PORCINE) IN NACL 1000-0.9 UT/500ML-% IV SOLN
INTRAVENOUS | Status: AC
  Filled 2020-12-06: qty 1000

## 2020-12-06 MED ORDER — ACETAMINOPHEN 325 MG PO TABS
650.0000 mg | ORAL_TABLET | ORAL | Status: DC | PRN
  Administered 2020-12-06: 650 mg via ORAL
  Filled 2020-12-06: qty 2

## 2020-12-06 MED ORDER — HEPARIN SODIUM (PORCINE) 1000 UNIT/ML IJ SOLN
INTRAMUSCULAR | Status: AC
  Filled 2020-12-06: qty 1

## 2020-12-06 MED ORDER — LIDOCAINE HCL (PF) 1 % IJ SOLN
INTRAMUSCULAR | Status: DC | PRN
  Administered 2020-12-06 (×2): 18 mL

## 2020-12-06 MED ORDER — CLOPIDOGREL BISULFATE 75 MG PO TABS
75.0000 mg | ORAL_TABLET | Freq: Every day | ORAL | Status: DC
  Administered 2020-12-07: 75 mg via ORAL
  Filled 2020-12-06: qty 1

## 2020-12-06 MED ORDER — ISOSORBIDE MONONITRATE ER 60 MG PO TB24
60.0000 mg | ORAL_TABLET | Freq: Every day | ORAL | Status: DC
  Administered 2020-12-07: 60 mg via ORAL
  Filled 2020-12-06: qty 1

## 2020-12-06 MED ORDER — FENTANYL CITRATE (PF) 100 MCG/2ML IJ SOLN
INTRAMUSCULAR | Status: AC
  Filled 2020-12-06: qty 2

## 2020-12-06 MED ORDER — ONDANSETRON HCL 4 MG/2ML IJ SOLN: 4.0000 mg | Freq: Four times a day (QID) | INTRAMUSCULAR | Status: DC | PRN

## 2020-12-06 MED ORDER — MIDAZOLAM HCL 2 MG/2ML IJ SOLN
INTRAMUSCULAR | Status: DC | PRN
  Administered 2020-12-06: 1 mg via INTRAVENOUS

## 2020-12-06 SURGICAL SUPPLY — 29 items
BALLN MUSTANG 10X20X75 (BALLOONS) ×4
BALLN MUSTANG 6.0X20 75 (BALLOONS) ×4
BALLOON MUSTANG 10X20X75 (BALLOONS) ×1 IMPLANT
BALLOON MUSTANG 6.0X20 75 (BALLOONS) ×1 IMPLANT
CATH ANGIO 5F PIGTAIL 65CM (CATHETERS) ×2 IMPLANT
CATH CROSS OVER TEMPO 5F (CATHETERS) ×2 IMPLANT
CATH NAVICROSS ST 65CM (CATHETERS) ×1 IMPLANT
CATH STRAIGHT 5FR 65CM (CATHETERS) ×2 IMPLANT
CATHETER NAVICROSS ST 65CM (CATHETERS) ×4
DEVICE CONTINUOUS FLUSH (MISCELLANEOUS) ×2 IMPLANT
DIAMONDBACK SOLID OAS 2.0MM (CATHETERS) ×4
GUIDEWIRE ANGLED .035X150CM (WIRE) ×2 IMPLANT
KIT ENCORE 26 ADVANTAGE (KITS) ×2 IMPLANT
KIT PV (KITS) ×4 IMPLANT
LUBRICANT VIPERSLIDE CORONARY (MISCELLANEOUS) ×2 IMPLANT
SHEATH BRITE TIP 7FR 35CM (SHEATH) ×2 IMPLANT
SHEATH PINNACLE 5F 10CM (SHEATH) ×2 IMPLANT
SHEATH PINNACLE 7F 10CM (SHEATH) ×2 IMPLANT
SHEATH PROBE COVER 6X72 (BAG) ×4 IMPLANT
STENT VIABAHN 8X29X80 VBX (Permanent Stent) ×2 IMPLANT
SYR MEDRAD MARK 7 150ML (SYRINGE) ×4 IMPLANT
SYSTEM DIMNDBCK SLD OAS 2.0MM (CATHETERS) ×1 IMPLANT
TAPE VIPERTRACK RADIOPAQ (MISCELLANEOUS) ×1 IMPLANT
TAPE VIPERTRACK RADIOPAQUE (MISCELLANEOUS) ×4
TRANSDUCER W/STOPCOCK (MISCELLANEOUS) ×4 IMPLANT
TRAY PV CATH (CUSTOM PROCEDURE TRAY) ×4 IMPLANT
WIRE HITORQ VERSACORE ST 145CM (WIRE) ×2 IMPLANT
WIRE ROSEN-J .035X260CM (WIRE) ×2 IMPLANT
WIRE VIPER ADVANCE .017X335CM (WIRE) ×2 IMPLANT

## 2020-12-06 NOTE — Plan of Care (Signed)
  Problem: Education: Goal: Knowledge of General Education information will improve Description Including pain rating scale, medication(s)/side effects and non-pharmacologic comfort measures Outcome: Progressing   

## 2020-12-06 NOTE — Progress Notes (Signed)
ACT 144. 72fr arterial sheath aspirated and removed from left femoral artery. Manual pressure applied for 20 minutes. Tegaderm dressing applied. Site rebled, hematoma forming. Manual pressure reapplied for 15 minutes. Site level 1 no s+s of additional hematoma. Site is swollen with no hard hematoma present.   60fr sheath aspirated and removed from right femoral artery, manual pressure applied for 20 minutes. Site level 0 no s+s of hematoma. Tegaderm dressing applied bedrest instructions given.   Patient reminded multible times not to raise his head off the pillow, or shift his hips.   Bilateral dp pulses weak but palpable, left pt weakly palpable.   Bedrest begins at 18:10:00.

## 2020-12-06 NOTE — Plan of Care (Signed)
  Problem: Education: Goal: Knowledge of General Education information will improve Description: Including pain rating scale, medication(s)/side effects and non-pharmacologic comfort measures 12/06/2020 1955 by Rema Fendt, RN Outcome: Progressing 12/06/2020 1954 by Rema Fendt, RN Outcome: Progressing

## 2020-12-07 DIAGNOSIS — I10 Essential (primary) hypertension: Secondary | ICD-10-CM

## 2020-12-07 DIAGNOSIS — I739 Peripheral vascular disease, unspecified: Secondary | ICD-10-CM

## 2020-12-07 DIAGNOSIS — E785 Hyperlipidemia, unspecified: Secondary | ICD-10-CM

## 2020-12-07 LAB — BASIC METABOLIC PANEL
Anion gap: 8 (ref 5–15)
BUN: 10 mg/dL (ref 8–23)
CO2: 24 mmol/L (ref 22–32)
Calcium: 8.9 mg/dL (ref 8.9–10.3)
Chloride: 105 mmol/L (ref 98–111)
Creatinine, Ser: 0.82 mg/dL (ref 0.61–1.24)
GFR, Estimated: >60 mL/min (ref >60)
Glucose, Bld: 138 mg/dL — ABNORMAL HIGH (ref 70–99)
Potassium: 3.6 mmol/L (ref 3.5–5.1)
Sodium: 137 mmol/L (ref 135–145)

## 2020-12-07 LAB — CBC
HCT: 38.5 % — ABNORMAL LOW (ref 39.0–52.0)
Hemoglobin: 13 g/dL (ref 13.0–17.0)
MCH: 30.2 pg (ref 26.0–34.0)
MCHC: 33.8 g/dL (ref 30.0–36.0)
MCV: 89.3 fL (ref 80.0–100.0)
Platelets: 226 10*3/uL (ref 150–400)
RBC: 4.31 MIL/uL (ref 4.22–5.81)
RDW: 13.7 % (ref 11.5–15.5)
WBC: 11.7 10*3/uL — ABNORMAL HIGH (ref 4.0–10.5)
nRBC: 0 % (ref 0.0–0.2)

## 2020-12-07 NOTE — Discharge Summary (Signed)
Discharge Summary    Patient ID: [name] MRN: [id number]; DOB: [birth year]  Admit date: 12/06/2020 Discharge date: 12/07/2020  PCP:  Richard Roers, MD   [facility] Providers Cardiologist:  Richard Burow, MD   {  Discharge Diagnoses    Principal Problem:   Claudication in peripheral vascular disease [facility]) Active Problems:   HTN (hypertension)   Dyslipidemia    Diagnostic Studies/Procedures    PV angiogram: 12/06/20  Final Impression: Successful orbital atherectomy, PTA and covered stenting of a high-grade eccentric calcified left common iliac artery stenosis for lifestyle limiting claudication.  The previously atherectomized left SFA is now completely occluded as is the profunda femoris.  The infrapopliteal vascular vessels are collateralized from the common femoral artery.  I suspect that he will have some clinical benefit as a result of improving inflow.  The sheath will be removed and pressure held once ACT falls below 170.  Patient left lab in stable condition.  He will be gently hydrated overnight, discharged home in the morning on dual antiplatelet therapy.  We will get lower extremity arterial Doppler studies in my [place] line office next week and I will see him back 1 to 2 weeks thereafter.   [name]. MD, [facility] 12/06/2020 11:51 AM _____________   History of Present Illness     [name] is a 80 y.o. 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 Lilly Lloyd December 27, 2004, for left main 3-vessel disease. He had a LIMA to his LAD, a vein to his circumflex marginal branch as well as to the PDA. His other problems include hypertension and hyperlipidemia. Followed by Dr. Gwenlyn Lloyd as an outpatient of PAD. Recently seen in the office on 7/5 with Dr. Gwenlyn Lloyd after abnormal ABIs with recommendations for outpatient PV angiogram.   Hospital Course     Underwent successful orbital atherectomy, PTA and covered stenting of a high-grade eccentric  calcified left common iliac artery stenosis for lifestyle limiting claudication. Previously arthrectomized left SFA was completely occluded.  Infrapopliteal vascular vessels were collateralized from the common femoral artery. Recommendations for DAPT with ASA/plavix. No complications note overnight. He was continued on home medications without significant changes.  General: Well developed, well nourished, male appearing in no acute distress. Head: Normocephalic, atraumatic.  Neck: Supple without bruits, JVD. Lungs:  Resp regular and unlabored, CTA. Heart: RRR, S1, S2, no S3, S4, or murmur; no rub. Abdomen: Soft, non-tender, non-distended with normoactive bowel sounds. No hepatomegaly. No rebound/guarding. No obvious abdominal masses. Extremities: No clubbing, cyanosis, edema. Distal pedal pulses are 2+ bilaterally. L/R femoral cath site stable without  hematoma Neuro: Alert and oriented X 3. Moves all extremities spontaneously. Psych: Normal affect.  Patient was seen by Dr. Martinique and deemed stable for discharge home. Follow up appt arranged prior to discharge.    Did the patient have an acute coronary syndrome (MI, NSTEMI, STEMI, etc) this admission?:  No                               Did the patient have a percutaneous coronary intervention (stent / angioplasty)?:  No.       _____________  Discharge Vitals Blood pressure (!) 156/65, pulse 74, temperature 98.7 F (37.1 C), temperature source Oral, resp. rate 17, height 6' 1"  (1.854 m), weight 97.1 kg, SpO2 95 %.  Filed Weights   12/06/20 0730  Weight: 97.1 kg    Labs &  Radiologic Studies    CBC Recent Labs    12/07/20 0207  WBC 11.7*  HGB 13.0  HCT 38.5*  MCV 89.3  PLT 297   Basic Metabolic Panel Recent Labs    12/07/20 0207  NA 137  K 3.6  CL 105  CO2 24  GLUCOSE 138*  BUN 10  CREATININE 0.82  CALCIUM 8.9   Liver Function Tests No results for input(s): AST, ALT, ALKPHOS, BILITOT, PROT, ALBUMIN in the last 72  hours. No results for input(s): LIPASE, AMYLASE in the last 72 hours. High Sensitivity Troponin:   No results for input(s): TROPONINIHS in the last 720 hours.  BNP Invalid input(s): POCBNP D-Dimer No results for input(s): DDIMER in the last 72 hours. Hemoglobin A1C No results for input(s): HGBA1C in the last 72 hours. Fasting Lipid Panel No results for input(s): CHOL, HDL, LDLCALC, TRIG, CHOLHDL, LDLDIRECT in the last 72 hours. Thyroid Function Tests No results for input(s): TSH, T4TOTAL, T3FREE, THYROIDAB in the last 72 hours.  Invalid input(s): FREET3 _____________  PERIPHERAL VASCULAR CATHETERIZATION  Result Date: 12/06/2020 Formatting of this result is different from the original. Images from the original result were not included.  [id number] LOCATION:  FACILITY: [place] PHYSICIAN: [name], M.D. 1940-10-21 DATE OF PROCEDURE:  12/06/2020 DATE OF DISCHARGE: PV Angiogram/Intervention History obtained from chart review.[name] is a 80 y.o.  mildly overweight married Caucasian male whoI last saw him in the office 10/09/2020.Marland Kitchen  He is accompanied by his wife [name] today.  He is the father of one child, grandfather and one grandchild. I last saw him in the office 06/26/2020. 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 Lilly Lloyd December 27, 2004, for left main 3-vessel disease. He had a LIMA to his LAD, a vein to his circumflex marginal branch as well as to the PDA. His other problems include hypertension and hyperlipidemia. He denies chest pain or shortness of breath. He does have left ABI in the 0.85range with a high-frequency signal in his left SFA by duplex ultrasound 10/20/12 but did not claudication at that time. His last Myoview performed 06/27/11 was nonischemic.4 years ago he developed lifestyle limiting bilateral lower extremity claudication. Should be noted that he did stop smoking at the time of his bypass surgery in 2006. He underwent peripheral angiography by  myself 08/04/16 revealing a long segment calcified occlusion right SFA and high-grade diffuse calcified left SFA stenosis. I did not think that he could be percutaneously revascularized on the right, and therefore opted for medical therapy. This Pletal has been titrated to 100 mg by mouth twice a day without significant improvement in his claudication symptoms. These are lifestyle limiting. He denies chest pain or shortness of breath.  I performed peripheral angiography and endovascular therapy of his diffusely calcified and highly obstructive left SFA using diamondback orbital rotational atherectomy followed by drug-eluting balloon plasty. This left ABI increased from 0.63-.85 and his claudication has significantly improved and were resolved.   He  had progressive upper extremity effort angina.  He did have a negative Myoview 07/14/2019.  He saw Kerin Ransom, PA-C in the office 04/27/2020.  His Imdur was increased but this is not affected the frequency and/or severity of his symptoms.  He also complains of claudication bilaterally.  He has a known occluded right SFA from prior angiography status post orbital atherectomy, PTA of his subtotally occluded calcified left SFA 04/13/2017.  Recent Dopplers performed 04/26/2020 revealed a left ABI of 0.52  with an occluded left popliteal artery.  I performed right left heart cath on him 06/28/2020 revealing a left dominant system, normal LV function, patent vein to the distal left PDA, LM branch and LIMA to the LAD.  He did have a 90% apical LAD stenosis after LIMA insertion.  His hemodynamics are stable.  He says he feels better since the heart cath although I did not do an intervention.  I also performed abdominal aortography and bilateral iliac angiography revealing no obstructive disease.  He does complain of bilateral calf claudication.  The etiology of his chest pain cannot be explained by the findings on his heart cath I have reassured him and his wife Juliann Pulse.  Since I saw  him in the office a month and a half ago, he still remained stable from a heart point of view and his chest pain has resolved.  He continues to complain of left lower extremity claudication and has failed Pletal.  His Doppler study suggested an occluded left popliteal artery.  He wishes to proceed with angiography. Pre Procedure Diagnosis: Peripheral arterial disease Post Procedure Diagnosis: Peripheral arterial disease Operators: Dr. Quay Lloyd Procedures Performed:  1.  Ultrasound-guided right and left common femoral access  2.  Abdominal aortogram, bilateral iliac angiogram  3.  Contralateral access (secondary catheter placement)  4.  Left lower extremity angiography with runoff             5.  Orbital atherectomy, VBX covered stenting left common iliac artery. PROCEDURE DESCRIPTION: The patient was brought to the second floor [place] [facility]iac cath lab in the the postabsorptive state. He was premedicated with IV Versed and fentanyl. His right and left groins were prepped and shaved in usual sterile fashion. Xylocaine 1% was used for local anesthesia. A 5 French sheath was inserted into the right common femoral artery using standard Seldinger technique.  A 7 French Brite tip sheath was inserted into the left common femoral artery.  A 5 French pigtail catheters placed in the distal abdominal aorta.  Distal abdominal aortography and bilateral iliac angiography were performed.  Contralateral access was obtained with a crossover catheter, Glidewire and 035 Nava cross endhole catheter (secondary catheter placement).  Left lower extremity angiography with runoff was performed using bolus chase, digital subtraction and step table technique.  Omnipaque dye was used for the entirety of the case (150 cc administered total to the patient).  Retrograde aortic pressures monitored to the case.  Angiographic Data: 1: Distal abdominal aortic-fluoroscopically calcified but widely patent 2: Left lower extremity-90% exophytic  calcified distal left common iliac artery stenosis with a 40 mm resting gradient, 90% calcified stenosis at the origin of the left extrailiac artery at the takeoff of the hypogastric artery, 90% eccentric distal left common femoral artery stenosis.  Occluded profunda femoris, occluded SFA from the origin down to the adductor canal with three-vessel runoff. 3: Right lower extremity-occluded right SFA from the origin (previously documented).   Mr. Fluegge has a high-grade exophytic calcified distal left common iliac artery stenosis with a 40 mm resting gradient.  His left SFA which I have previously intervened on and is now occluded and probably not percutaneously revascularizable.  We will proceed with orbital atherectomy, PTA and covered stenting with a VBX stent to improve inflow.  The patient is already on aspirin and Plavix. Procedure Description: The patient received a total of 19,500's of heparin with peak ACT of 277.  I was able to cross the calcified iliac lesion ipsilaterally from  the left groin with a Glidewire and a Nava cross catheter.  I then exchanged for an 014/017 Viper wire and performed multiple orbital atherectomy passes with a 2 mm bur up to 220,000 RPMs.  I then changed out the Viper wire for a 035 Rosen wire and predilated the calcified iliac lesion with a 6 mm x 2 cm Mavik balloon.  I then advanced the sheath with the dilator in across the calcified atherectomized segment and carefully positioned an 8 mm x 29 mm long VBX stent and deployed at 12 atm.  The native vessel was clearly larger than the thinness and there was not complete stent apposition.  I therefore elected to post dilate with a 10 mm x 2 cm Mavik balloon at 16 atm resulting in reduction of a 90% eccentric exophytic calcified distal left common iliac artery stenosis to 0% residual.  I then pulled the Brite tip sheath down below the smooth lesion in the proximal left extrailiac artery and performed a pullback gradient with a 5 French  endhole catheter after administration of 200 mcg of intra-arterial nitroglycerin which revealed no gradient.  I exchanged the Brite tip sheath for a short 7 French sheath and secured both sheaths in place. Final Impression: Successful orbital atherectomy, PTA and covered stenting of a high-grade eccentric calcified left common iliac artery stenosis for lifestyle limiting claudication.  The previously atherectomized left SFA is now completely occluded as is the profunda femoris.  The infrapopliteal vascular vessels are collateralized from the common femoral artery.  I suspect that he will have some clinical benefit as a result of improving inflow.  The sheath will be removed and pressure held once ACT falls below 170.  Patient left lab in stable condition.  He will be gently hydrated overnight, discharged home in the morning on dual antiplatelet therapy.  We will get lower extremity arterial Doppler studies in my [place] line office next week and I will see him back 1 to 2 weeks thereafter. [name]. MD, [facility] 12/06/2020 11:51 AM    Disposition   Pt is being discharged home today in good condition.  Follow-up Plans & Appointments     Follow-up Information     [facility] Follow up on 12/13/2020.   Specialty: Cardiology Why: at 1pm for your follow up dopplers Contact information: [address] [place] [phone]        Lorretta Harp, MD Follow up on 12/26/2020.   Specialties: Cardiology, Radiology Why: at 11am for your follow up appt Contact information: [address] [place] [address] [phone]                  Discharge Medications   Allergies as of 12/07/2020       Reactions   Crestor [rosuvastatin] Other (See Comments)   Myalgias   Lipitor [atorvastatin]    myalgias   Lisinopril    cough        Medication List     TAKE these medications    amLODipine 10 MG tablet Commonly known  as: NORVASC Take 10 mg by mouth daily.   aspirin 81 MG tablet Take 81 mg by mouth daily.   clopidogrel 75 MG tablet Commonly known as: PLAVIX Take 1 tablet (75 mg total) by mouth daily.   CoQ10 200 MG Caps Take 200 mg by mouth daily.   Fish Oil 1200 MG Caps Take 2 capsules (2,400 mg total) by mouth every morning.  isosorbide mononitrate 60 MG 24 hr tablet Commonly known as: IMDUR Take 60 mg by mouth daily.   metoprolol tartrate 50 MG tablet Commonly known as: LOPRESSOR Take 50 mg by mouth daily.   Praluent 150 MG/ML Soaj Generic drug: Alirocumab Inject 150 mg into the skin every 14 (fourteen) days.   Vitamin D (Ergocalciferol) 1.25 MG (50000 UNIT) Caps capsule Commonly known as: DRISDOL Take 50,000 Units by mouth every Sunday.         Outstanding Labs/Studies   Follow up dopplers  Duration of Discharge Encounter   Greater than 30 minutes including physician time.  Signed, Reino Bellis, NP 12/07/2020, 9:12 AM

## 2020-12-07 NOTE — Progress Notes (Signed)
Pt is alert and oriented. Discharge instructions/ AVS given to pt. 

## 2020-12-10 ENCOUNTER — Other Ambulatory Visit (HOSPITAL_COMMUNITY): Payer: Self-pay | Admitting: Cardiovascular Disease

## 2020-12-10 DIAGNOSIS — Z95820 Peripheral vascular angioplasty status with implants and grafts: Secondary | ICD-10-CM

## 2020-12-13 ENCOUNTER — Ambulatory Visit (HOSPITAL_COMMUNITY)
Admission: RE | Admit: 2020-12-13 | Payer: Medicare HMO | Source: Ambulatory Visit | Attending: Cardiovascular Disease | Admitting: Cardiovascular Disease

## 2020-12-24 ENCOUNTER — Ambulatory Visit (HOSPITAL_BASED_OUTPATIENT_CLINIC_OR_DEPARTMENT_OTHER)
Admission: RE | Admit: 2020-12-24 | Discharge: 2020-12-24 | Disposition: A | Discharge status: Home / Self Care | Payer: Medicare HMO | Source: Ambulatory Visit | Attending: Cardiology | Admitting: Cardiology

## 2020-12-24 ENCOUNTER — Other Ambulatory Visit: Payer: Self-pay

## 2020-12-24 ENCOUNTER — Ambulatory Visit (HOSPITAL_COMMUNITY)
Admission: RE | Admit: 2020-12-24 | Discharge: 2020-12-24 | Disposition: A | Discharge status: Home / Self Care | Payer: Medicare HMO | Source: Ambulatory Visit | Attending: Cardiovascular Disease | Admitting: Cardiovascular Disease

## 2020-12-24 DIAGNOSIS — Z95820 Peripheral vascular angioplasty status with implants and grafts: Secondary | ICD-10-CM | POA: Diagnosis present

## 2020-12-24 DIAGNOSIS — I739 Peripheral vascular disease, unspecified: Secondary | ICD-10-CM

## 2020-12-26 ENCOUNTER — Encounter: Payer: Self-pay | Admitting: Cardiovascular Disease

## 2020-12-26 ENCOUNTER — Ambulatory Visit (INDEPENDENT_AMBULATORY_CARE_PROVIDER_SITE_OTHER): Payer: Medicare HMO | Admitting: Cardiovascular Disease

## 2020-12-26 ENCOUNTER — Other Ambulatory Visit: Payer: Self-pay

## 2020-12-26 VITALS — BP 116/80 | HR 66 | Ht 73.0 in | Wt 216.6 lb

## 2020-12-26 DIAGNOSIS — I739 Peripheral vascular disease, unspecified: Secondary | ICD-10-CM

## 2020-12-26 NOTE — Assessment & Plan Note (Signed)
[name] returns today for follow-up of recent peripheral vascular procedure I did for 6 months 12/06/2020 for claudication.  SFAs were occluded bilaterally.  He had a 90% calcified distal left common iliac artery stenosis.  I performed orbital atherectomy, PTA and covered stenting with an 8 mm x 29 mm long VBX covered stent postdilated with a 10 mm x 2 cm balloon with excellent result.  His ABIs did not change but his iliac velocities improved as did his claudication.  His SFA has not percutaneously addressable.  We will get follow-up Dopplers in 6 months.

## 2020-12-26 NOTE — Progress Notes (Signed)
12/26/2020 [name]   12-25-1940  [id number]  Primary Physician [name], MD Primary Cardiologist: [name] MD FACP, [place], [place], [place]  HPI:  [name] is a 80 y.o.  mildly overweight married Caucasian male whoI last saw him in the office 11/20/2020.Marland Kitchen  Unfortunately his wife [name] has been in the hospital with pneumonia.  He is the father of one child, grandfather and one grandchild. I last saw him in the office 06/26/2020. 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 [name] December 27, 2004, for left main 3-vessel disease. He had a LIMA to his LAD, a vein to his circumflex marginal branch as well as to the PDA. His other problems include hypertension and hyperlipidemia. He denies chest pain or shortness of breath. He does have left ABI in the 0.85range with a high-frequency signal in his left SFA by duplex ultrasound 10/20/12 but did not claudication at that time. His last Myoview performed 06/27/11 was nonischemic.4 years ago he developed lifestyle limiting bilateral lower extremity claudication. Should be noted that he did stop smoking at the time of his bypass surgery in 2006. He underwent peripheral angiography by myself 08/04/16 revealing a long segment calcified occlusion right SFA and high-grade diffuse calcified left SFA stenosis. I did not think that he could be percutaneously revascularized on the right, and therefore opted for medical therapy. This Pletal has been titrated to 100 mg by mouth twice a day without significant improvement in his claudication symptoms. These are lifestyle limiting. He denies chest pain or shortness of breath.   I performed peripheral angiography and endovascular therapy of his diffusely calcified and highly obstructive left SFA using diamondback orbital rotational atherectomy followed by drug-eluting balloon plasty. This left ABI increased from 0.63-.85 and his claudication has significantly improved and were resolved.     He  had progressive upper extremity effort angina.  He did have a negative Myoview 07/14/2019.  He saw Corine Shelter, PA-C in the office 04/27/2020.  His Imdur was increased but this is not affected the frequency and/or severity of his symptoms.  He also complains of claudication bilaterally.  He has a known occluded right SFA from prior angiography status post orbital atherectomy, PTA of his subtotally occluded calcified left SFA 04/13/2017.  Recent Dopplers performed 04/26/2020 revealed a left ABI of 0.52 with an occluded left popliteal artery.   I performed right left heart cath on him 06/28/2020 revealing a left dominant system, normal LV function, patent vein to the distal left PDA, LM branch and LIMA to the LAD.  He did have a 90% apical LAD stenosis after LIMA insertion.  His hemodynamics are stable.  He says he feels better since the heart cath although I did not do an intervention.  I also performed abdominal aortography and bilateral iliac angiography revealing no obstructive disease.  He does complain of bilateral calf claudication.  The etiology of his chest pain cannot be explained by the findings on his heart cath I have reassured him and his wife [name].   I performed peripheral angiography on him 12/06/2020 revealing occluded SFAs bilaterally with three-vessel runoff.  He had a 90% calcified exophytic distal left common iliac artery stenosis.  I performed orbital atherectomy, PTA and covered stenting with an 8 mm x 29 mm long VBX covered stent postdilated with a 10 mm x 2 cm balloon.  His follow-up Doppler studies revealed unchanged ABIs although his velocities improved.  His claudication  improved as well although given his total and his FAA he still has lifestyle limiting claudication.     Current Meds  Medication Sig   Alirocumab (PRALUENT) 150 MG/ML SOAJ Inject 150 mg into the skin every 14 (fourteen) days.   amLODipine (NORVASC) 10 MG tablet Take 10 mg by mouth daily.   aspirin 81 MG  tablet Take 81 mg by mouth daily.   clopidogrel (PLAVIX) 75 MG tablet Take 1 tablet (75 mg total) by mouth daily.   Coenzyme Q10 (COQ10) 200 MG CAPS Take 200 mg by mouth daily.   isosorbide mononitrate (IMDUR) 60 MG 24 hr tablet Take 60 mg by mouth daily.   metoprolol tartrate (LOPRESSOR) 50 MG tablet Take 50 mg by mouth daily.   Omega-3 Fatty Acids (FISH OIL) 1200 MG CAPS Take 2 capsules (2,400 mg total) by mouth every morning.   Vitamin D, Ergocalciferol, (DRISDOL) 50000 UNITS CAPS capsule Take 50,000 Units by mouth every Sunday.      Allergies  Allergen Reactions   Crestor [Rosuvastatin] Other (See Comments)    Myalgias   Lipitor [Atorvastatin]     myalgias   Lisinopril     cough    Social History   Socioeconomic History   Marital status: Married    Spouse name: cathy   Number of children: 1   Years of education: 12   Highest education level: Not on file  Occupational History   Occupation: retired  Tobacco Use   Smoking status: Former    Packs/day: 1.00    Years: 50.00    Pack years: 50.00    Types: Cigarettes    Quit date: 12/17/2004    Years since quitting: 16.0   Smokeless tobacco: Never  Vaping Use   Vaping Use: Never used  Substance and Sexual Activity   Alcohol use: Yes    Comment: occasionally   Drug use: No   Sexual activity: Not on file  Other Topics Concern   Not on file  Social History Narrative   Lives with wife   Caffeine - coffee, 1 cup daily   Social Determinants of Health   Financial Resource Strain: Not on file  Food Insecurity: Not on file  Transportation Needs: Not on file  Physical Activity: Not on file  Stress: Not on file  Social Connections: Not on file  Intimate Partner Violence: Not on file     Review of Systems: General: negative for chills, fever, night sweats or weight changes.  Cardiovascular: negative for chest pain, dyspnea on exertion, edema, orthopnea, palpitations, paroxysmal nocturnal dyspnea or shortness of  breath Dermatological: negative for rash Respiratory: negative for cough or wheezing Urologic: negative for hematuria Abdominal: negative for nausea, vomiting, diarrhea, bright red blood per rectum, melena, or hematemesis Neurologic: negative for visual changes, syncope, or dizziness All other systems reviewed and are otherwise negative except as noted above.    Blood pressure 116/80, pulse 66, height 6\' 1"  (1.854 m), weight 216 lb 9.6 oz (98.2 kg), SpO2 94 %.  General appearance: alert and no distress Neck: no adenopathy, no carotid bruit, no JVD, supple, symmetrical, trachea midline, and thyroid not enlarged, symmetric, no tenderness/mass/nodules Lungs: clear to auscultation bilaterally Heart: regular rate and rhythm, S1, S2 normal, no murmur, click, rub or gallop Extremities: extremities normal, atraumatic, no cyanosis or edema Pulses: Absent pedal pulses Skin: Skin color, texture, turgor normal. No rashes or lesions Neurologic: Grossly normal  EKG not performed today  ASSESSMENT AND PLAN:   Claudication in peripheral  vascular disease [facility]) Mr. Rigor returns today for follow-up of recent peripheral vascular procedure I did for 6 months 12/06/2020 for claudication.  SFAs were occluded bilaterally.  He had a 90% calcified distal left common iliac artery stenosis.  I performed orbital atherectomy, PTA and covered stenting with an 8 mm x 29 mm long VBX covered stent postdilated with a 10 mm x 2 cm balloon with excellent result.  His ABIs did not change but his iliac velocities improved as did his claudication.  His SFA has not percutaneously addressable.  We will get follow-up Dopplers in 6 months.     [name] MD FACP,FACC,FAHA, [facility] 12/26/2020 11:38 AM

## 2020-12-26 NOTE — Patient Instructions (Addendum)
Medication Instructions:  The current medical regimen is effective;  continue present plan and medications.  *If you need a refill on your cardiac medications before your next appointment, please call your pharmacy*  Testing/Procedures: AORTA/IVC/ILIACS in 6 months    Follow-Up: At [facility], you and your health needs are our priority.  As part of our continuing mission to provide you with exceptional heart care, we have created designated Provider Care Teams.  These Care Teams include your primary Cardiologist (physician) and Advanced Practice Providers (APPs -  Physician Assistants and Nurse Practitioners) who all work together to provide you with the care you need, when you need it.  We recommend signing up for the patient portal called "MyChart".  Sign up information is provided on this After Visit Summary.  MyChart is used to connect with patients for Virtual Visits (Telemedicine).  Patients are able to view lab/test results, encounter notes, upcoming appointments, etc.  Non-urgent messages can be sent to your provider as well.   To learn more about what you can do with MyChart, go to [email or web address].    Your next appointment:   12 month(s)  The format for your next appointment:   In Person  Provider:   Nanetta Batty, MD

## 2020-12-27 ENCOUNTER — Encounter: Payer: Self-pay | Admitting: Cardiovascular Disease

## 2020-12-28 ENCOUNTER — Encounter (HOSPITAL_COMMUNITY): Payer: Self-pay | Admitting: Cardiovascular Disease

## 2021-03-06 ENCOUNTER — Ambulatory Visit (INDEPENDENT_AMBULATORY_CARE_PROVIDER_SITE_OTHER): Payer: Medicare HMO | Admitting: Medical

## 2021-03-06 ENCOUNTER — Encounter: Payer: Self-pay | Admitting: Medical

## 2021-03-06 ENCOUNTER — Other Ambulatory Visit: Payer: Self-pay

## 2021-03-06 ENCOUNTER — Ambulatory Visit (HOSPITAL_BASED_OUTPATIENT_CLINIC_OR_DEPARTMENT_OTHER)
Admission: RE | Admit: 2021-03-06 | Discharge: 2021-03-06 | Disposition: A | Discharge status: Home / Self Care | Payer: Medicare HMO | Source: Ambulatory Visit | Attending: Medical | Admitting: Medical

## 2021-03-06 VITALS — BP 127/61 | HR 62 | Temp 97.8°F | Ht 73.0 in | Wt 220.0 lb

## 2021-03-06 DIAGNOSIS — Z23 Encounter for immunization: Secondary | ICD-10-CM | POA: Diagnosis not present

## 2021-03-06 DIAGNOSIS — I10 Essential (primary) hypertension: Secondary | ICD-10-CM

## 2021-03-06 DIAGNOSIS — E785 Hyperlipidemia, unspecified: Secondary | ICD-10-CM

## 2021-03-06 DIAGNOSIS — Z951 Presence of aortocoronary bypass graft: Secondary | ICD-10-CM

## 2021-03-06 DIAGNOSIS — E559 Vitamin D deficiency, unspecified: Secondary | ICD-10-CM

## 2021-03-06 DIAGNOSIS — R0789 Other chest pain: Secondary | ICD-10-CM

## 2021-03-06 DIAGNOSIS — M79672 Pain in left foot: Secondary | ICD-10-CM | POA: Insufficient documentation

## 2021-03-06 DIAGNOSIS — M48061 Spinal stenosis, lumbar region without neurogenic claudication: Secondary | ICD-10-CM

## 2021-03-06 DIAGNOSIS — I739 Peripheral vascular disease, unspecified: Secondary | ICD-10-CM

## 2021-03-06 LAB — TROPONIN I (HIGH SENSITIVITY): High Sens Troponin I: 7 ng/L (ref 2–17)

## 2021-03-06 NOTE — Addendum Note (Signed)
Addended by: Maximino Sarin on: 03/06/2021 10:13 AM   Modules accepted: Orders

## 2021-03-06 NOTE — Patient Instructions (Addendum)
Recent atypical chest pain that occurred 4 to 5 days ago.  No pain presently except for direct reproducible left upper pectoralis area pain on palpation.  No associated cardiac type signs and symptoms.  No shoulder pain or dyspnea.  EKG done today showed normal sinus rhythm.  No ischemic changes.  For caution sake due to your heart history.  1 set of troponin stat.  Note pain if reproducible presently.  Recommend  Tylenol for pain.  NSAIDs would not be recommended since you are on Plavix.  If you have worse or changing signs symptoms then need to recommend ED evaluation.  Continue follow-up with cardiologist on a regular basis/as scheduled.  Continue Imdur daily.  For hyperlipidemia continue treatment prescribed by cardiologist.  Hypertension.  Continue amlodipine and metoprolol.  Left-sided foot pain intermittent and stepping.  We will get x-ray of left foot today.  Recommend that you get Dr. Margart Sickles forefoot pads and use daily.  Flu vaccine today and you can go downstairs to get COVID-vaccine/booster as well.   Follow-up in 1 month or sooner if needed.  We will update you on your 1 set troponin stat results.  If it were to be elevated then would need to advise ED evaluation.  Also will get vitamin D for vitamin D deficiency.

## 2021-03-06 NOTE — Progress Notes (Signed)
Subjective:    Patient ID: [name], male    DOB: 05-Aug-1940, 80 y.o.   MRN: [id number]  HPI  Pt in for first time.  Pt former pcp left area.    Pt has hx of htn, hyperlipidemia claudication and CAD.  Bp  127/61 today. Amlodipine 10 mg daily and metoprolol 50 mg daily.  For high cholesterol on praluent and fish oil.  Taking imdur daily.   Pt will get flu vaccine today.  Pt had 2 vaccine covid series April 2022.  1 year of on and off left side transient low level left side chest pain. Mild and transient. Pt states his cardiologist nows about this and he states told not his heart.  Though does have risk factors/+hx. Last time he had pain was about 5 days ago. No current cardiac type pain or associated type signs/symptoms.  Below last cardilogist vist portion of note.   "He  had progressive upper extremity effort angina.  He did have a negative Myoview 07/14/2019.  He saw Corine Shelter, PA-C in the office 04/27/2020.  His Imdur was increased but this is not affected the frequency and/or severity of his symptoms.  He also complains of claudication bilaterally.  He has a known occluded right SFA from prior angiography status post orbital atherectomy, PTA of his subtotally occluded calcified left SFA 04/13/2017.  Recent Dopplers performed 04/26/2020 revealed a left ABI of 0.52 with an occluded left popliteal artery.   I performed right left heart cath on him 06/28/2020 revealing a left dominant system, normal LV function, patent vein to the distal left PDA, LM branch and LIMA to the LAD.  He did have a 90% apical LAD stenosis after LIMA insertion.  His hemodynamics are stable.  He says he feels better since the heart cath although I did not do an intervention.  I also performed abdominal aortography and bilateral iliac angiography revealing no obstructive disease.  He does complain of bilateral calf claudication.  The etiology of his chest pain cannot be explained by the findings on his  heart cath I have reassured him and his wife [name]."  Pt notes he gets random sharp pain to left foot bottom aspect. Comes and goes. States pain present at time when he walks.        Review of Systems  Constitutional:  Negative for chills and fever.  HENT:  Negative for congestion, ear discharge and ear pain.   Respiratory:  Negative for cough, chest tightness, shortness of breath and wheezing.   Cardiovascular:  Negative for chest pain and palpitations.  Gastrointestinal:  Negative for abdominal pain.  Genitourinary:  Negative for dysuria and frequency.  Musculoskeletal:  Negative for back pain and neck pain.  Skin:  Negative for rash.  Neurological:  Negative for dizziness, numbness and headaches.  Hematological:  Negative for adenopathy. Does not bruise/bleed easily.  Psychiatric/Behavioral:  Negative for behavioral problems.     Past Medical History:  Diagnosis Date   Claudication (HCC) 06/27/2011   Left ABIs abnormal values with mild-moderate arterial insufficiency, right SFA moderate amount of mixed density plaque elevating velocities suggestive 50-69% diameter reduction   Coronary artery disease 06/27/2011   Normal Lexiscan, EF 64, post-stress EF 64, no EKG changes   Diabetes mellitus without complication (HCC)    prediabetic   Hyperlipidemia    Hypertension    Impotence of organic origin    Osteoarthritis    Other testicular hypofunction    PVD (peripheral vascular disease) (HCC)  Social History   Socioeconomic History   Marital status: Married    Spouse name: cathy   Number of children: 1   Years of education: 12   Highest education level: Not on file  Occupational History   Occupation: retired  Tobacco Use   Smoking status: Former    Packs/day: 1.00    Years: 50.00    Pack years: 50.00    Types: Cigarettes    Quit date: 12/17/2004    Years since quitting: 16.2   Smokeless tobacco: Never  Vaping Use   Vaping Use: Never used  Substance and Sexual  Activity   Alcohol use: Yes    Comment: occasionally   Drug use: No   Sexual activity: Not on file  Other Topics Concern   Not on file  Social History Narrative   Lives with wife   Caffeine - coffee, 1 cup daily   Social Determinants of Health   Financial Resource Strain: Not on file  Food Insecurity: Not on file  Transportation Needs: Not on file  Physical Activity: Not on file  Stress: Not on file  Social Connections: Not on file  Intimate Partner Violence: Not on file    Past Surgical History:  Procedure Laterality Date   ABDOMINAL AORTOGRAM N/A 06/28/2020   Procedure: ABDOMINAL AORTOGRAM;  Surgeon: [name], MD;  Location: [facility] INVASIVE [facility];  Service: Cardiovascular;  Laterality: N/A;   CARDIAC CATHETERIZATION  12/26/2004   Left main-90% distal tapered stenosis, left circumflex-95% ostial stenosis, first marginal branch 99% ostial branch, posterior descending artery 99% ostial stenosis, 60-70% segmental mid stenosis, needed CABG   CORONARY ARTERY BYPASS GRAFT  12/27/2004   Left main 3-vessel disease, LIMA to LAD, saphenous vein to circumflexmarginal branch and to the PDA   INGUINAL HERNIA REPAIR Left 2005   LOWER EXTREMITY ANGIOGRAPHY N/A 04/13/2017   Procedure: LOWER EXTREMITY ANGIOGRAPHY;  Surgeon: [name], MD;  Location: [facility];  Service: Cardiovascular;  Laterality: N/A;   LOWER EXTREMITY ANGIOGRAPHY Bilateral 12/06/2020   Procedure: Lower Extremity Angiography;  Surgeon: [name], MD;  Location: [facility] INVASIVE CV LAB;  Service: Cardiovascular;  Laterality: Bilateral;  Limited Study   LOWER EXTREMITY INTERVENTION N/A 08/04/2016   Procedure: Lower Extremity Intervention;  Surgeon: [name], MD;  Location: [facility] INVASIVE CV LAB;  Service: Cardiovascular;  Laterality: N/A;   PERIPHERAL VASCULAR ATHERECTOMY Left 04/13/2017   Procedure: PERIPHERAL VASCULAR ATHERECTOMY;  Surgeon: [name], MD;  Location: [facility] INVASIVE CV LAB;   Service: Cardiovascular;  Laterality: Left;  SFA   PERIPHERAL VASCULAR ATHERECTOMY  12/06/2020   Procedure: PERIPHERAL VASCULAR ATHERECTOMY;  Surgeon: [name], MD;  Location: [facility] INVASIVE CV LAB;  Service: Cardiovascular;;  Lt Iliac   PERIPHERAL VASCULAR BALLOON ANGIOPLASTY Left 04/13/2017   Procedure: PERIPHERAL VASCULAR BALLOON ANGIOPLASTY;  Surgeon: [name], MD;  Location: [facility];  Service: Cardiovascular;  Laterality: Left;  SFA   PERIPHERAL VASCULAR INTERVENTION  12/06/2020   Procedure: PERIPHERAL VASCULAR INTERVENTION;  Surgeon: [name], MD;  Location: [facility];  Service: Cardiovascular;;  Lt Iliac   RIGHT/LEFT HEART CATH AND CORONARY/GRAFT ANGIOGRAPHY N/A 06/28/2020   Procedure: RIGHT/LEFT HEART CATH AND CORONARY/GRAFT ANGIOGRAPHY;  Surgeon: [name], MD;  Location: [facility];  Service: Cardiovascular;  Laterality: N/A;    Family History  Problem Relation Age of Onset   Hypertension Mother        82  Clotting disorder Father        56   Diabetes Brother        heart surgery    Allergies  Allergen Reactions   Crestor [Rosuvastatin] Other (See Comments)    Myalgias   Lipitor [Atorvastatin]     myalgias   Lisinopril     cough    Current Outpatient Medications on File Prior to Visit  Medication Sig Dispense Refill   Alirocumab (PRALUENT) 150 MG/ML SOAJ Inject 150 mg into the skin every 14 (fourteen) days. 2 mL 11   amLODipine (NORVASC) 10 MG tablet Take 10 mg by mouth daily.     aspirin 81 MG tablet Take 81 mg by mouth daily.     clopidogrel (PLAVIX) 75 MG tablet Take 1 tablet (75 mg total) by mouth daily. 90 tablet 3   Coenzyme Q10 (COQ10) 200 MG CAPS Take 200 mg by mouth daily.     isosorbide mononitrate (IMDUR) 60 MG 24 hr tablet Take 60 mg by mouth daily.     metoprolol tartrate (LOPRESSOR) 50 MG tablet Take 50 mg by mouth daily.     Omega-3 Fatty Acids (FISH OIL) 1200 MG CAPS Take 2 capsules (2,400  mg total) by mouth every morning.     Vitamin D, Ergocalciferol, (DRISDOL) 50000 UNITS CAPS capsule Take 50,000 Units by mouth every Sunday.      No current facility-administered medications on file prior to visit.    BP 127/61   Pulse 62   Temp 97.8 F (36.6 C)   Ht 6\' 1"  (1.854 m)   Wt 220 lb (99.8 kg)   SpO2 95%   BMI 29.03 kg/m       Objective:   Physical Exam  General Mental Status- Alert. General Appearance- Not in acute distress.   Skin General: Color- Normal Color. Moisture- Normal Moisture.  Neck Carotid Arteries- Normal color. Moisture- Normal Moisture. No carotid bruits. No JVD.  Chest and Lung Exam Auscultation: Breath Sounds:-Normal.  Cardiovascular Auscultation:Rythm- Regular. Murmurs & Other Heart Sounds:Auscultation of the heart reveals- No Murmurs.  Abdomen Inspection:-Inspeection Normal. Palpation/Percussion:Note:No mass. Palpation and Percussion of the abdomen reveal- Non Tender, Non Distended + BS, no rebound or guarding.    Neurologic Cranial Nerve exam:- CN III-XII intact(No nystagmus), symmetric smile. Strength:- 5/5 equal and symmetric strength both upper and lower extremities.       Assessment & Plan:   Patient Instructions  Recent atypical chest pain that occurred 4 to 5 days ago.  No pain presently except for direct reproducible left upper pectoralis area pain on palpation.  No associated cardiac type signs and symptoms.  No shoulder pain or dyspnea.  EKG done today showed normal sinus rhythm.  No ischemic changes.  For caution sake due to your heart history.  1 set of troponin stat.  Note pain if reproducible presently.  Recommend  Tylenol for pain.  NSAIDs would not be recommended since you are on Plavix.  If you have worse or changing signs symptoms then need to recommend ED evaluation.  Continue follow-up with cardiologist on a regular basis/as scheduled.  Continue Imdur daily.  For hyperlipidemia continue treatment prescribed by  cardiologist.  Hypertension.  Continue amlodipine and metoprolol.  Left-sided foot pain intermittent and stepping.  We will get x-ray of left foot today.  Recommend that you get Dr. forefoot pads and use daily.  Flu vaccine today and you can go downstairs to get COVID-vaccine/booster as well.   Follow-up in 1 month or  sooner if needed.  We will update you on your 1 set troponin stat results.  If it were to be elevated then would need to advise ED evaluation.   [name], PA-C

## 2021-03-08 ENCOUNTER — Other Ambulatory Visit: Payer: Self-pay | Admitting: Cardiovascular Disease

## 2021-03-08 DIAGNOSIS — I739 Peripheral vascular disease, unspecified: Secondary | ICD-10-CM

## 2021-03-12 LAB — VITAMIN D 1,25 DIHYDROXY
Vitamin D 1, 25 (OH)2 Total: 97 pg/mL — ABNORMAL HIGH (ref 18–72)
Vitamin D2 1, 25 (OH)2: 52 pg/mL
Vitamin D3 1, 25 (OH)2: 45 pg/mL

## 2021-04-10 ENCOUNTER — Ambulatory Visit (INDEPENDENT_AMBULATORY_CARE_PROVIDER_SITE_OTHER): Payer: Medicare HMO | Admitting: Medical

## 2021-04-10 ENCOUNTER — Other Ambulatory Visit: Payer: Self-pay

## 2021-04-10 VITALS — BP 121/58 | HR 63 | Temp 98.3°F | Resp 16 | Ht 73.0 in | Wt 212.2 lb

## 2021-04-10 DIAGNOSIS — E785 Hyperlipidemia, unspecified: Secondary | ICD-10-CM

## 2021-04-10 DIAGNOSIS — R0789 Other chest pain: Secondary | ICD-10-CM

## 2021-04-10 DIAGNOSIS — Z951 Presence of aortocoronary bypass graft: Secondary | ICD-10-CM | POA: Diagnosis not present

## 2021-04-10 DIAGNOSIS — M25511 Pain in right shoulder: Secondary | ICD-10-CM

## 2021-04-10 DIAGNOSIS — I10 Essential (primary) hypertension: Secondary | ICD-10-CM | POA: Diagnosis not present

## 2021-04-10 MED ORDER — CEPHALEXIN 500 MG PO CAPS: 500.0000 mg | ORAL_CAPSULE | Freq: Two times a day (BID) | ORAL | 0 refills | Status: DC

## 2021-04-10 NOTE — Progress Notes (Signed)
Subjective:    Patient ID: [name], male    DOB: 1941-04-30, 80 y.o.   MRN: [id number]  HPI  Pt reports rt side of neck bump that has been present for at least 6 months. Not reporting pain. He states about same size not getting larger.  Pt also has rt arm pain/soreness. He is pointing to shoulder /tricep area. No trauma, no fall and reports rt handed. When he lift the arm pain is worse.  Pt has appointment with Dr. Allyson Sabal in February. Pt states again he still is having some left upper out pectoralis pain. He tell me he has gotten these over past year. He states last night had mild discomfort for 5-10 minutes around 9 pm. No jaw pain, no left arm pain,no nausea, no vomiting or sweating. Last time in had atypical chest pain with negative work up. Imdur on current med list. Since last time I saw pt had 3-4 times. Typically notices lying in bed. He sits up and pain will resolve. Not worse with activity.  Hx of htn. Blood pressure well controlled.    Review of Systems  Constitutional:  Negative for fever.  HENT:  Negative for congestion and drooling.   Respiratory:  Negative for cough, chest tightness, shortness of breath and wheezing.   Cardiovascular:  Negative for chest pain and palpitations.  Gastrointestinal:  Negative for abdominal distention, abdominal pain, constipation, nausea and rectal pain.  Genitourinary:  Negative for dysuria.  Musculoskeletal:  Negative for back pain and myalgias.  Skin:  Negative for rash.  Neurological:  Negative for dizziness, speech difficulty, weakness, numbness and headaches.  Hematological:  Negative for adenopathy.  Psychiatric/Behavioral:  Negative for behavioral problems and confusion.     Past Medical History:  Diagnosis Date   Claudication (HCC) 06/27/2011   Left ABIs abnormal values with mild-moderate arterial insufficiency, right SFA moderate amount of mixed density plaque elevating velocities suggestive 50-69% diameter reduction    Coronary artery disease 06/27/2011   Normal Lexiscan, EF 64, post-stress EF 64, no EKG changes   Diabetes mellitus without complication (HCC)    prediabetic   Hyperlipidemia    Hypertension    Impotence of organic origin    Osteoarthritis    Other testicular hypofunction    PVD (peripheral vascular disease) (HCC)      Social History   Socioeconomic History   Marital status: Married    Spouse name: cathy   Number of children: 1   Years of education: 12   Highest education level: Not on file  Occupational History   Occupation: retired  Tobacco Use   Smoking status: Former    Packs/day: 1.00    Years: 50.00    Pack years: 50.00    Types: Cigarettes    Quit date: 12/17/2004    Years since quitting: 16.3   Smokeless tobacco: Never  Vaping Use   Vaping Use: Never used  Substance and Sexual Activity   Alcohol use: Yes    Comment: occasionally   Drug use: No   Sexual activity: Not on file  Other Topics Concern   Not on file  Social History Narrative   Lives with wife   Caffeine - coffee, 1 cup daily   Social Determinants of Health   Financial Resource Strain: Not on file  Food Insecurity: Not on file  Transportation Needs: Not on file  Physical Activity: Not on file  Stress: Not on file  Social Connections: Not on file  Intimate Partner Violence: Not  on file    Past Surgical History:  Procedure Laterality Date   ABDOMINAL AORTOGRAM N/A 06/28/2020   Procedure: ABDOMINAL AORTOGRAM;  Surgeon: [name], MD;  [facility]: [facility] INVASIVE CV LAB;  Service: Cardiovascular;  Laterality: N/A;   CARDIAC CATHETERIZATION  12/26/2004   Left main-90% distal tapered stenosis, left circumflex-95% ostial stenosis, first marginal branch 99% ostial branch, posterior descending artery 99% ostial stenosis, 60-70% segmental mid stenosis, needed CABG   CORONARY ARTERY BYPASS GRAFT  12/27/2004   Left main 3-vessel disease, LIMA to LAD, saphenous vein to circumflexmarginal branch and to the  PDA   INGUINAL HERNIA REPAIR Left 2005   LOWER EXTREMITY ANGIOGRAPHY N/A 04/13/2017   Procedure: LOWER EXTREMITY ANGIOGRAPHY;  Surgeon: [name], MD;  Location: [facility]AS[facility];  Service: Cardiovascular;  Laterality: N/A;   LOWER EXTREMITY ANGIOGRAPHY Bilateral 12/06/2020   Procedure: Lower Extremity Angiography;  Surgeon: [name], MD;  Location: [facility] INVASIVE CV LAB;  Service: Cardiovascular;  Laterality: Bilateral;  Limited Study   LOWER EXTREMITY INTERVENTION N/A 08/04/2016   Procedure: Lower Extremity Intervention;  Surgeon: [name], MD;  Location: [facility] INVASIVE CV LAB;  Service: Cardiovascular;  Laterality: N/A;   PERIPHERAL VASCULAR ATHERECTOMY Left 04/13/2017   Procedure: PERIPHERAL VASCULAR ATHERECTOMY;  Surgeon: [name], MD;  Location: [facility] INVASIVE CV LAB;  Service: Cardiovascular;  Laterality: Left;  SFA   PERIPHERAL VASCULAR ATHERECTOMY  12/06/2020   Procedure: PERIPHERAL VASCULAR ATHERECTOMY;  Surgeon: [name], MD;  Location: [facility] INVASIVE CV LAB;  Service: Cardiovascular;;  Lt Iliac   PERIPHERAL VASCULAR BALLOON ANGIOPLASTY Left 04/13/2017   Procedure: PERIPHERAL VASCULAR BALLOON ANGIOPLASTY;  Surgeon: [name], MD;  Location: [facility] INVASIVE [facility];  Service: Cardiovascular;  Laterality: Left;  SFA   PERIPHERAL VASCULAR INTERVENTION  12/06/2020   Procedure: PERIPHERAL VASCULAR INTERVENTION;  Surgeon: [name], MD;  Location: [facility] INVASIVE CV [facility];  Service: Cardiovascular;;  Lt Iliac   RIGHT/LEFT HEART CATH AND CORONARY/GRAFT ANGIOGRAPHY N/A 06/28/2020   Procedure: RIGHT/LEFT HEART CATH AND CORONARY/GRAFT ANGIOGRAPHY;  Surgeon: [name], MD;  Location: [facility] INVASIVE CV LAB;  Service: Cardiovascular;  Laterality: N/A;    Family History  Problem Relation Age of Onset   Hypertension Mother        42   Clotting disorder Father        48   Diabetes Brother        heart surgery    Allergies  Allergen Reactions    Crestor [Rosuvastatin] Other (See Comments)    Myalgias   Lipitor [Atorvastatin]     myalgias   Lisinopril     cough    Current Outpatient Medications on File Prior to Visit  Medication Sig Dispense Refill   Alirocumab (PRALUENT) 150 MG/ML SOAJ Inject 150 mg into the skin every 14 (fourteen) days. 2 mL 11   amLODipine (NORVASC) 10 MG tablet Take 10 mg by mouth daily.     aspirin 81 MG tablet Take 81 mg by mouth daily.     clopidogrel (PLAVIX) 75 MG tablet Take 1 tablet (75 mg total) by mouth daily. 90 tablet 3   Coenzyme Q10 (COQ10) 200 MG CAPS Take 200 mg by mouth daily.     isosorbide mononitrate (IMDUR) 60 MG 24 hr tablet Take 60 mg by mouth daily.     metoprolol tartrate (LOPRESSOR) 50 MG tablet Take 50 mg by mouth daily.     Omega-3 Fatty Acids (FISH OIL) 1200  MG CAPS Take 2 capsules (2,400 mg total) by mouth every morning.     Vitamin D, Ergocalciferol, (DRISDOL) 50000 UNITS CAPS capsule Take 50,000 Units by mouth every Sunday.      No current facility-administered medications on file prior to visit.    BP (!) 121/58   Pulse 63   Temp 98.3 F (36.8 C)   Resp 16   Ht 6\' 1"  (1.854 m)   Wt 212 lb 3.2 oz (96.3 kg)   SpO2 90%   BMI 28.00 kg/m       Objective:   Physical Exam  General Mental Status- Alert. General Appearance- Not in acute distress.   Skin Back and neck right of midline has moderate sized apparent sebaceous cyst.  Nontender to palpation.  Neck Carotid Arteries- Normal color. Moisture- Normal Moisture. No carotid bruits. No JVD.  Chest and Lung Exam Auscultation: Breath Sounds:-Normal.  Cardiovascular Auscultation:Rythm- Regular. Murmurs & Other Heart Sounds:Auscultation of the heart reveals- No Murmurs.  Abdomen Inspection:-Inspeection Normal. Palpation/Percussion:Note:No mass. Palpation and Percussion of the abdomen reveal- Non Tender, Non Distended + BS, no rebound or guarding.   Neurologic Cranial Nerve exam:- CN III-XII intact(No  nystagmus), symmetric smile. Strength:- 5/5 equal and symmetric strength both upper and lower extremities.   Right shoulder-pain on abduction and rotation.  Pain appears to be most prominent lateral aspect junction of upper tricep and shoulder.    Assessment & Plan:   Patient Instructions  History of atypical intermittent left upper outer pectoralis/chest pain.  Pain more noticed when lying flat on bed and resolving on sitting or supine.  No classic associated cardiac symptoms.  Pain not worsened with activity.  EKG showed normal sinus rhythm.  No ischemia noted.  No current pain presently but did have some last night.  For caution sake as we approached the 4-day holiday weekend we will do 1 set of troponin stat.  If you get recurrent pain-not resolving or with different signs and symptoms then recommend ED evaluation.  Referral to cardiologist placed as well.  Hypertension well controlled with current med regimen.  Hyperlipidemia continue praluent and fish oil.   History of stents continue Plavix and aspirin.  For recent right shoulder pain we will get right shoulder x-ray.  After review that may refer you to sports medicine.  For right side neck sebaceous cyst will prescribe Keflex antibiotic and assess if that is decreased.  If not consider referral to dermatologist.  Follow-up in 10 days or sooner if needed.   Time spent with patient today was  41 minutes which consisted of chart review, discussing diagnosis, work up treatment and documentation.

## 2021-04-10 NOTE — Patient Instructions (Addendum)
History of atypical intermittent left upper outer pectoralis/chest pain.  Pain more noticed when lying flat on bed and resolving on sitting or supine.  No classic associated cardiac symptoms.  Pain not worsened with activity.  EKG showed normal sinus rhythm.  No ischemia noted.  No current pain presently but did have some last night.  For caution sake as we approached the 4-day holiday weekend we will do 1 set of troponin stat.  If you get recurrent pain-not resolving or with different signs and symptoms then recommend ED evaluation.  Referral to cardiologist placed as well.   Hypertension well controlled with current med regimen.  Hyperlipidemia continue praluent and fish oil.   History of stents continue Plavix and aspirin.  For recent right shoulder pain we will get right shoulder x-ray.  After review that may refer you to sports medicine.  For right side neck sebaceous cyst will prescribe Keflex antibiotic and assess if that is decreased.  If not consider referral to dermatologist.  Follow-up in 10 days or sooner if needed.

## 2021-04-17 ENCOUNTER — Ambulatory Visit: Payer: Medicare HMO | Admitting: Cardiovascular Disease

## 2021-04-17 ENCOUNTER — Other Ambulatory Visit: Payer: Self-pay

## 2021-04-17 ENCOUNTER — Encounter: Payer: Self-pay | Admitting: Cardiovascular Disease

## 2021-04-17 DIAGNOSIS — I10 Essential (primary) hypertension: Secondary | ICD-10-CM | POA: Diagnosis not present

## 2021-04-17 DIAGNOSIS — I739 Peripheral vascular disease, unspecified: Secondary | ICD-10-CM

## 2021-04-17 DIAGNOSIS — E785 Hyperlipidemia, unspecified: Secondary | ICD-10-CM

## 2021-04-17 DIAGNOSIS — Z951 Presence of aortocoronary bypass graft: Secondary | ICD-10-CM

## 2021-04-17 NOTE — Progress Notes (Signed)
04/17/2021 [name]   [birth year]  [id number]  Primary Physician Saguier, Kateri Mc Primary Cardiologist: [name] MD Milagros Loll, [place], [place]  HPI:  [name] is a 80 y.o. male mildly overweight married Caucasian male whoI last saw him in the office 12/26/2020.Marland Kitchen  Unfortunately his wife [name] has been in the hospital with pneumonia.  He is the father of one child, grandfather and one grandchild. I last saw him in the office 06/26/2020. 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 [name] December 27, 2004, for left main 3-vessel disease. He had a LIMA to his LAD, a vein to his circumflex marginal branch as well as to the PDA. His other problems include hypertension and hyperlipidemia. He denies chest pain or shortness of breath. He does have left ABI in the 0.85range with a high-frequency signal in his left SFA by duplex ultrasound 10/20/12 but did not claudication at that time. His last Myoview performed 06/27/11 was nonischemic.4 years ago he developed lifestyle limiting bilateral lower extremity claudication. Should be noted that he did stop smoking at the time of his bypass surgery in 2006. He underwent peripheral angiography by myself 08/04/16 revealing a long segment calcified occlusion right SFA and high-grade diffuse calcified left SFA stenosis. I did not think that he could be percutaneously revascularized on the right, and therefore opted for medical therapy. This Pletal has been titrated to 100 mg by mouth twice a day without significant improvement in his claudication symptoms. These are lifestyle limiting. He denies chest pain or shortness of breath.   I performed peripheral angiography and endovascular therapy of his diffusely calcified and highly obstructive left SFA using diamondback orbital rotational atherectomy followed by drug-eluting balloon plasty. This left ABI increased from 0.63-.85 and his claudication has significantly improved and were  resolved.    He  had progressive upper extremity effort angina.  He did have a negative Myoview 07/14/2019.  He saw Corine Shelter, PA-C in the office 04/27/2020.  His Imdur was increased but this is not affected the frequency and/or severity of his symptoms.  He also complains of claudication bilaterally.  He has a known occluded right SFA from prior angiography status post orbital atherectomy, PTA of his subtotally occluded calcified left SFA 04/13/2017.  Recent Dopplers performed 04/26/2020 revealed a left ABI of 0.52 with an occluded left popliteal artery.   I performed right left heart cath on him 06/28/2020 revealing a left dominant system, normal LV function, patent vein to the distal left PDA, LM branch and LIMA to the LAD.  He did have a 90% apical LAD stenosis after LIMA insertion.  His hemodynamics are stable.  He says he feels better since the heart cath although I did not do an intervention.  I also performed abdominal aortography and bilateral iliac angiography revealing no obstructive disease.  He does complain of bilateral calf claudication.  The etiology of his chest pain cannot be explained by the findings on his heart cath I have reassured him and his wife [name].   I performed peripheral angiography on him 12/06/2020 revealing occluded SFAs bilaterally with three-vessel runoff.  He had a 90% calcified exophytic distal left common iliac artery stenosis.  I performed orbital atherectomy, PTA and covered stenting with an 8 mm x 29 mm long VBX covered stent postdilated with a 10 mm x 2 cm balloon.  His follow-up Doppler studies revealed unchanged ABIs although his velocities improved.  His claudication  improved as well although given his SFAs bilaterally.  He still has some lifestyle limiting claudication.  Since I saw him in the office 3 months ago he is remained stable.  He gets occasional atypical noncardiac left upper chest pain although his anatomy by cath a year ago remained stable.  His lipid  profile is excellent on Praluent.  Current Meds  Medication Sig   Alirocumab (PRALUENT) 150 MG/ML SOAJ Inject 150 mg into the skin every 14 (fourteen) days.   amLODipine (NORVASC) 10 MG tablet Take 10 mg by mouth daily.   aspirin 81 MG tablet Take 81 mg by mouth daily.   cephALEXin (KEFLEX) 500 MG capsule Take 1 capsule (500 mg total) by mouth 2 (two) times daily.   clopidogrel (PLAVIX) 75 MG tablet Take 1 tablet (75 mg total) by mouth daily.   Coenzyme Q10 (COQ10) 200 MG CAPS Take 200 mg by mouth daily.   isosorbide mononitrate (IMDUR) 60 MG 24 hr tablet Take 60 mg by mouth daily.   metoprolol tartrate (LOPRESSOR) 50 MG tablet Take 50 mg by mouth daily.   Omega-3 Fatty Acids (FISH OIL) 1200 MG CAPS Take 2 capsules (2,400 mg total) by mouth every morning.   Vitamin D, Ergocalciferol, (DRISDOL) 50000 UNITS CAPS capsule Take 50,000 Units by mouth every Sunday.      Allergies  Allergen Reactions   Crestor [Rosuvastatin] Other (See Comments)    Myalgias   Lipitor [Atorvastatin]     myalgias   Lisinopril     cough    Social History   Socioeconomic History   Marital status: Married    Spouse name: cathy   Number of children: 1   Years of education: 12   Highest education level: Not on file  Occupational History   Occupation: retired  Tobacco Use   Smoking status: Former    Packs/day: 1.00    Years: 50.00    Pack years: 50.00    Types: Cigarettes    Quit date: 12/17/2004    Years since quitting: 16.3   Smokeless tobacco: Never  Vaping Use   Vaping Use: Never used  Substance and Sexual Activity   Alcohol use: Yes    Comment: occasionally   Drug use: No   Sexual activity: Not on file  Other Topics Concern   Not on file  Social History Narrative   Lives with wife   Caffeine - coffee, 1 cup daily   Social Determinants of Health   Financial Resource Strain: Not on file  Food Insecurity: Not on file  Transportation Needs: Not on file  Physical Activity: Not on file   Stress: Not on file  Social Connections: Not on file  Intimate Partner Violence: Not on file     Review of Systems: General: negative for chills, fever, night sweats or weight changes.  Cardiovascular: negative for chest pain, dyspnea on exertion, edema, orthopnea, palpitations, paroxysmal nocturnal dyspnea or shortness of breath Dermatological: negative for rash Respiratory: negative for cough or wheezing Urologic: negative for hematuria Abdominal: negative for nausea, vomiting, diarrhea, bright red blood per rectum, melena, or hematemesis Neurologic: negative for visual changes, syncope, or dizziness All other systems reviewed and are otherwise negative except as noted above.    Blood pressure 122/60, pulse 61, height 6\' 1"  (1.854 m), weight 220 lb 6.4 oz (100 kg), SpO2 97 %.  General appearance: alert and no distress Neck: no adenopathy, no carotid bruit, no JVD, supple, symmetrical, trachea midline, and thyroid not enlarged, symmetric, no  tenderness/mass/nodules Lungs: clear to auscultation bilaterally Heart: regular rate and rhythm, S1, S2 normal, no murmur, click, rub or gallop Extremities: extremities normal, atraumatic, no cyanosis or edema Pulses: Absent pedal pulses Skin: Skin color, texture, turgor normal. No rashes or lesions Neurologic: Grossly normal  EKG sinus rhythm at 61 without ST or T wave changes.  I personally reviewed this EKG.  ASSESSMENT AND PLAN:   Hx of CABG History of CAD status post coronary artery bypass grafting by Dr. Roxy Manns 12/27/2004 for left main/three-vessel disease.  Because of chest pain I performed outpatient right left heart cath on him 06/28/2020 revealing a patent vein to the distal L PDA, vein to an obtuse marginal branch and LIMA to the LAD.  He had a nondominant RCA.  He now complains of infrequent atypical chest pain.  HTN (hypertension) History of essential hypertension a blood pressure measured today 122/60.  He is on amlodipine and  metoprolol.  Dyslipidemia History of dyslipidemia intolerant to statin therapy on Praluent with lipid profile performed 10/09/2020 revealing total cholesterol 72, LDL 50 and HDL 42.  Peripheral vascular disease ([place]) History of PAD status post left SFA intervention in the past for a CTO 08/04/2016.  He underwent repeat angiography 12/06/2020 revealing occluded SFAs bilaterally with a high-grade calcified plaque in the mid left common iliac artery.  He underwent orbital atherectomy followed by VBX covered stenting with excellent result.  His follow-up Dopplers revealed improved velocities and his claudication somewhat improved as well.     Lorretta Harp MD FACP,FACC,FAHA, [facility] 04/17/2021 9:21 AM

## 2021-04-17 NOTE — Assessment & Plan Note (Signed)
History of CAD status post coronary artery bypass grafting by Dr. Cornelius Moras 12/27/2004 for left main/three-vessel disease.  Because of chest pain I performed outpatient right left heart cath on him 06/28/2020 revealing a patent vein to the distal L PDA, vein to an obtuse marginal branch and LIMA to the LAD.  He had a nondominant RCA.  He now complains of infrequent atypical chest pain.

## 2021-04-17 NOTE — Assessment & Plan Note (Signed)
History of dyslipidemia intolerant to statin therapy on Praluent with lipid profile performed 10/09/2020 revealing total cholesterol 72, LDL 50 and HDL 42.

## 2021-04-17 NOTE — Assessment & Plan Note (Signed)
History of essential hypertension a blood pressure measured today 122/60.  He is on amlodipine and metoprolol.

## 2021-04-17 NOTE — Patient Instructions (Signed)

## 2021-04-17 NOTE — Assessment & Plan Note (Signed)
History of PAD status post left SFA intervention in the past for a CTO 08/04/2016.  He underwent repeat angiography 12/06/2020 revealing occluded SFAs bilaterally with a high-grade calcified plaque in the mid left common iliac artery.  He underwent orbital atherectomy followed by VBX covered stenting with excellent result.  His follow-up Dopplers revealed improved velocities and his claudication somewhat improved as well.

## 2021-04-22 ENCOUNTER — Ambulatory Visit (INDEPENDENT_AMBULATORY_CARE_PROVIDER_SITE_OTHER): Payer: Medicare HMO | Admitting: Family

## 2021-04-22 VITALS — BP 105/58 | HR 61 | Temp 98.4°F | Resp 16 | Ht 73.0 in | Wt 222.6 lb

## 2021-04-22 DIAGNOSIS — R0789 Other chest pain: Secondary | ICD-10-CM | POA: Diagnosis not present

## 2021-04-22 DIAGNOSIS — L723 Sebaceous cyst: Secondary | ICD-10-CM

## 2021-04-22 NOTE — Assessment & Plan Note (Addendum)
He reports no pain currently. Saw his cardiologist last week and cardiology was not concerned about CAD as cause for his pain. Monitor.

## 2021-04-22 NOTE — Assessment & Plan Note (Signed)
No sign of infection at this point. He was treated with Keflex. Discussed referral to dermatology for removal, but it is not really bothering him so he declines. Monitor.

## 2021-04-22 NOTE — Progress Notes (Signed)
Subjective:   By signing my name below, I, Shehryar Baig, attest that this documentation has been prepared under the direction and in the presence of Sandford Craze NP. 04/22/2021      Patient ID: [name], male    DOB: 1940/08/21, 80 y.o.   MRN: [id number]  Chief Complaint  Patient presents with   Follow-up    Here for 10 day follow up    HPI Patient is in today for a office visit.   Neck- He continues complaining of a bump on the right side of his neck. He continues taking 500 mg keflex and reports the size of the bump has reduced in size. He denies having any pain at this time.  Chest pain- During last visit he was complaining of chest pain while lying flat. He EKG and lab results were normal. He then seen his cardiologist and found no new issues with his heart. He has not had chest pain since his initial episode. He denies having any swelling in his ankles.  Immunizations- He is UTD on flu vaccines.    Health Maintenance Due  Topic Date Due   COVID-19 Vaccine (1) Never done   Pneumonia Vaccine 40+ Years old (1 - PCV) Never done   Hepatitis C Screening  Never done   TETANUS/TDAP  Never done   Zoster Vaccines- Shingrix (1 of 2) Never done    Past Medical History:  Diagnosis Date   Claudication (HCC) 06/27/2011   Left ABIs abnormal values with mild-moderate arterial insufficiency, right SFA moderate amount of mixed density plaque elevating velocities suggestive 50-69% diameter reduction   Coronary artery disease 06/27/2011   Normal Lexiscan, EF 64, post-stress EF 64, no EKG changes   Diabetes mellitus without complication (HCC)    prediabetic   Hyperlipidemia    Hypertension    Impotence of organic origin    Osteoarthritis    Other testicular hypofunction    PVD (peripheral vascular disease) (HCC)     Past Surgical History:  Procedure Laterality Date   ABDOMINAL AORTOGRAM N/A 06/28/2020   Procedure: ABDOMINAL AORTOGRAM;  Surgeon: [name], MD;   Location: [facility];  Service: Cardiovascular;  Laterality: N/A;   CARDIAC CATHETERIZATION  12/26/2004   Left main-90% distal tapered stenosis, left circumflex-95% ostial stenosis, first marginal branch 99% ostial branch, posterior descending artery 99% ostial stenosis, 60-70% segmental mid stenosis, needed CABG   CORONARY ARTERY BYPASS GRAFT  12/27/2004   Left main 3-vessel disease, LIMA to LAD, saphenous vein to circumflexmarginal branch and to the PDA   INGUINAL HERNIA REPAIR Left 2005   LOWER EXTREMITY ANGIOGRAPHY N/A 04/13/2017   Procedure: LOWER EXTREMITY ANGIOGRAPHY;  Surgeon: [name], MD;  Location: [facility];  Service: Cardiovascular;  Laterality: N/A;   LOWER EXTREMITY ANGIOGRAPHY Bilateral 12/06/2020   Procedure: Lower Extremity Angiography;  Surgeon: [name], MD;  Location: [facility]IVE CV [facility];  Service: Cardiovascular;  Laterality: Bilateral;  Limited Study   LOWER EXTREMITY INTERVENTION N/A 08/04/2016   Procedure: Lower Extremity Intervention;  Surgeon: [name], MD;  Location: [facility] INVASIVE CV LAB;  Service: Cardiovascular;  Laterality: N/A;   PERIPHERAL VASCULAR ATHERECTOMY Left 04/13/2017   Procedure: PERIPHERAL VASCULAR ATHERECTOMY;  Surgeon: [name], MD;  Location: [facility] INVASIVE CV LAB;  Service: Cardiovascular;  Laterality: Left;  SFA   PERIPHERAL VASCULAR ATHERECTOMY  12/06/2020   Procedure: PERIPHERAL VASCULAR ATHERECTOMY;  Surgeon: [name], MD;  Location: [facility] INVASIVE CV LAB;  Service: Cardiovascular;;  Lt Iliac   PERIPHERAL VASCULAR BALLOON ANGIOPLASTY Left 04/13/2017   Procedure: PERIPHERAL VASCULAR BALLOON ANGIOPLASTY;  Surgeon: [name], MD;  Location: [facility];  Service: Cardiovascular;  Laterality: Left;  SFA   PERIPHERAL VASCULAR INTERVENTION  12/06/2020   Procedure: PERIPHERAL VASCULAR INTERVENTION;  Surgeon: [name], MD;  Location: [facility];  Service: Cardiovascular;;  Lt  Iliac   RIGHT/LEFT HEART CATH AND CORONARY/GRAFT ANGIOGRAPHY N/A 06/28/2020   Procedure: RIGHT/LEFT HEART CATH AND CORONARY/GRAFT ANGIOGRAPHY;  Surgeon: [name], MD;  Location: [facility];  Service: Cardiovascular;  Laterality: N/A;    Family History  Problem Relation Age of Onset   Hypertension Mother        36   Clotting disorder Father        35   Diabetes Brother        heart surgery    Social History   Socioeconomic History   Marital status: Married    Spouse name: cathy   Number of children: 1   Years of education: 12   Highest education level: Not on file  Occupational History   Occupation: retired  Tobacco Use   Smoking status: Former    Packs/day: 1.00    Years: 50.00    Pack years: 50.00    Types: Cigarettes    Quit date: 12/17/2004    Years since quitting: 16.3   Smokeless tobacco: Never  Vaping Use   Vaping Use: Never used  Substance and Sexual Activity   Alcohol use: Yes    Comment: occasionally   Drug use: No   Sexual activity: Not on file  Other Topics Concern   Not on file  Social History Narrative   Lives with wife   Caffeine - coffee, 1 cup daily   Social Determinants of Health   Financial Resource Strain: Not on file  Food Insecurity: Not on file  Transportation Needs: Not on file  Physical Activity: Not on file  Stress: Not on file  Social Connections: Not on file  Intimate Partner Violence: Not on file    Outpatient Medications Prior to Visit  Medication Sig Dispense Refill   Alirocumab (PRALUENT) 150 MG/ML SOAJ Inject 150 mg into the skin every 14 (fourteen) days. 2 mL 11   amLODipine (NORVASC) 10 MG tablet Take 10 mg by mouth daily.     aspirin 81 MG tablet Take 81 mg by mouth daily.     cephALEXin (KEFLEX) 500 MG capsule Take 1 capsule (500 mg total) by mouth 2 (two) times daily. 20 capsule 0   clopidogrel (PLAVIX) 75 MG tablet Take 1 tablet (75 mg total) by mouth daily. 90 tablet 3   Coenzyme Q10 (COQ10) 200  MG CAPS Take 200 mg by mouth daily.     isosorbide mononitrate (IMDUR) 60 MG 24 hr tablet Take 60 mg by mouth daily.     metoprolol tartrate (LOPRESSOR) 50 MG tablet Take 50 mg by mouth daily.     Omega-3 Fatty Acids (FISH OIL) 1200 MG CAPS Take 2 capsules (2,400 mg total) by mouth every morning.     Vitamin D, Ergocalciferol, (DRISDOL) 50000 UNITS CAPS capsule Take 50,000 Units by mouth every Sunday.      No facility-administered medications prior to visit.    Allergies  Allergen Reactions   Crestor [Rosuvastatin] Other (See Comments)    Myalgias   Lipitor [Atorvastatin]     myalgias   Lisinopril  cough    Review of Systems  Cardiovascular:  Negative for chest pain and leg swelling.  Musculoskeletal:  Negative for neck pain.  Skin:        (+)cyst on right side of neck      Objective:    Physical Exam Constitutional:      General: He is not in acute distress.    Appearance: Normal appearance. He is not ill-appearing.  HENT:     Head: Normocephalic and atraumatic.     Right Ear: External ear normal.     Left Ear: External ear normal.  Eyes:     Extraocular Movements: Extraocular movements intact.     Pupils: Pupils are equal, round, and reactive to light.  Cardiovascular:     Rate and Rhythm: Normal rate and regular rhythm.     Heart sounds: Normal heart sounds. No murmur heard.   No gallop.  Pulmonary:     Effort: Pulmonary effort is normal. No respiratory distress.     Breath sounds: Normal breath sounds. No wheezing or rales.  Skin:    General: Skin is warm and dry.     Comments: Sebaceous cyst in right posterior neck  Neurological:     Mental Status: He is alert and oriented to person, place, and time.  Psychiatric:        Behavior: Behavior normal.    BP (!) 105/58 (BP Location: Left Arm, Patient Position: Sitting, Cuff Size: Small)   Pulse 61   Temp 98.4 F (36.9 C)   Resp 16   Ht 6\' 1"  (1.854 m)   Wt 222 lb 9.6 oz (101 kg)   SpO2 96%   BMI 29.37  kg/m  Wt Readings from Last 3 Encounters:  04/22/21 222 lb 9.6 oz (101 kg)  04/17/21 220 lb 6.4 oz (100 kg)  04/10/21 212 lb 3.2 oz (96.3 kg)       Assessment & Plan:   Problem List Items Addressed This Visit       Unprioritized   Sebaceous cyst - Primary    No sign of infection at this point. He was treated with Keflex. Discussed referral to dermatology for removal, but it is not really bothering him so he declines. Monitor.       Atypical chest pain    He reports no pain currently. Saw his cardiologist last week and cardiology was not concerned about CAD as cause for his pain. Monitor.         No orders of the defined types were placed in this encounter.   I, 04/12/21 NP, personally preformed the services described in this documentation.  All medical record entries made by the scribe were at my direction and in my presence.  I have reviewed the chart and discharge instructions (if applicable) and agree that the record reflects my personal performance and is accurate and complete. 04/22/2021   I,Shehryar Baig,acting as a 14/09/2020 for Neurosurgeon, NP.,have documented all relevant documentation on the behalf of Lemont Fillers, NP,as directed by  Lemont Fillers, NP while in the presence of Lemont Fillers, NP.   Lemont Fillers, NP

## 2021-06-25 ENCOUNTER — Other Ambulatory Visit: Payer: Self-pay

## 2021-06-25 ENCOUNTER — Ambulatory Visit (HOSPITAL_BASED_OUTPATIENT_CLINIC_OR_DEPARTMENT_OTHER)
Admission: RE | Admit: 2021-06-25 | Discharge: 2021-06-25 | Disposition: A | Discharge status: Home / Self Care | Payer: Medicare HMO | Source: Ambulatory Visit | Attending: Cardiovascular Disease | Admitting: Cardiovascular Disease

## 2021-06-25 ENCOUNTER — Ambulatory Visit (HOSPITAL_COMMUNITY)
Admission: RE | Admit: 2021-06-25 | Discharge: 2021-06-25 | Disposition: A | Discharge status: Home / Self Care | Payer: Medicare HMO | Source: Ambulatory Visit | Attending: Cardiovascular Disease | Admitting: Cardiovascular Disease

## 2021-06-25 DIAGNOSIS — I739 Peripheral vascular disease, unspecified: Secondary | ICD-10-CM

## 2021-06-25 DIAGNOSIS — Z95820 Peripheral vascular angioplasty status with implants and grafts: Secondary | ICD-10-CM | POA: Diagnosis not present

## 2021-07-06 ENCOUNTER — Other Ambulatory Visit: Payer: Self-pay | Admitting: Cardiovascular Disease

## 2021-07-18 ENCOUNTER — Other Ambulatory Visit (HOSPITAL_COMMUNITY): Payer: Self-pay | Admitting: Cardiovascular Disease

## 2021-07-18 DIAGNOSIS — Z95828 Presence of other vascular implants and grafts: Secondary | ICD-10-CM

## 2021-08-13 ENCOUNTER — Other Ambulatory Visit: Payer: Self-pay | Admitting: Cardiovascular Disease

## 2021-08-24 ENCOUNTER — Other Ambulatory Visit: Payer: Self-pay | Admitting: Cardiovascular Disease

## 2021-09-15 ENCOUNTER — Other Ambulatory Visit: Payer: Self-pay | Admitting: Cardiovascular Disease

## 2021-09-23 ENCOUNTER — Other Ambulatory Visit: Payer: Self-pay | Admitting: Cardiovascular Disease

## 2021-10-21 ENCOUNTER — Ambulatory Visit (INDEPENDENT_AMBULATORY_CARE_PROVIDER_SITE_OTHER): Payer: Medicare HMO | Admitting: Medical

## 2021-10-21 ENCOUNTER — Encounter: Payer: Self-pay | Admitting: Medical

## 2021-10-21 ENCOUNTER — Other Ambulatory Visit: Payer: Medicare HMO

## 2021-10-21 ENCOUNTER — Ambulatory Visit (HOSPITAL_BASED_OUTPATIENT_CLINIC_OR_DEPARTMENT_OTHER)
Admission: RE | Admit: 2021-10-21 | Discharge: 2021-10-21 | Disposition: A | Discharge status: Home / Self Care | Payer: Medicare HMO | Source: Ambulatory Visit | Attending: Medical | Admitting: Medical

## 2021-10-21 VITALS — BP 109/70 | HR 62 | Temp 98.7°F | Ht 73.0 in | Wt 222.5 lb

## 2021-10-21 DIAGNOSIS — R0600 Dyspnea, unspecified: Secondary | ICD-10-CM | POA: Insufficient documentation

## 2021-10-21 DIAGNOSIS — Z951 Presence of aortocoronary bypass graft: Secondary | ICD-10-CM

## 2021-10-21 DIAGNOSIS — I10 Essential (primary) hypertension: Secondary | ICD-10-CM

## 2021-10-21 DIAGNOSIS — R0789 Other chest pain: Secondary | ICD-10-CM

## 2021-10-21 DIAGNOSIS — E785 Hyperlipidemia, unspecified: Secondary | ICD-10-CM

## 2021-10-21 DIAGNOSIS — E559 Vitamin D deficiency, unspecified: Secondary | ICD-10-CM

## 2021-10-21 LAB — LIPID PANEL
Cholesterol: 68 mg/dL (ref 0–200)
HDL: 40.3 mg/dL (ref >39.00)
LDL Cholesterol: 16 mg/dL (ref 0–99)
NonHDL: 28.19
Total CHOL/HDL Ratio: 2
Triglycerides: 63 mg/dL (ref 0.0–149.0)
VLDL: 12.6 mg/dL (ref 0.0–40.0)

## 2021-10-21 LAB — COMPREHENSIVE METABOLIC PANEL
ALT: 27 U/L (ref 0–53)
AST: 24 U/L (ref 0–37)
Albumin: 4.2 g/dL (ref 3.5–5.2)
Alkaline Phosphatase: 73 U/L (ref 39–117)
BUN: 15 mg/dL (ref 6–23)
CO2: 27 mEq/L (ref 19–32)
Calcium: 9.2 mg/dL (ref 8.4–10.5)
Chloride: 105 mEq/L (ref 96–112)
Creatinine, Ser: 0.72 mg/dL (ref 0.40–1.50)
GFR: 86.88 mL/min (ref >60.00)
Glucose, Bld: 149 mg/dL — ABNORMAL HIGH (ref 70–99)
Potassium: 4 mEq/L (ref 3.5–5.1)
Sodium: 140 mEq/L (ref 135–145)
Total Bilirubin: 1 mg/dL (ref 0.2–1.2)
Total Protein: 7.1 g/dL (ref 6.0–8.3)

## 2021-10-21 LAB — VITAMIN D 25 HYDROXY (VIT D DEFICIENCY, FRACTURES): VITD: 37.82 ng/mL (ref 30.00–100.00)

## 2021-10-21 LAB — TROPONIN I (HIGH SENSITIVITY): High Sens Troponin I: 6 ng/L (ref 2–17)

## 2021-10-21 LAB — BRAIN NATRIURETIC PEPTIDE: Brain Natriuretic Peptide: 30 pg/mL (ref <100)

## 2021-10-21 MED ORDER — FAMOTIDINE 20 MG PO TABS: 20.0000 mg | ORAL_TABLET | Freq: Every day | ORAL | 0 refills | Status: DC

## 2021-10-21 MED ORDER — FLUTICASONE PROPIONATE 50 MCG/ACT NA SUSP: 2.0000 | Freq: Every day | NASAL | 1 refills | Status: DC

## 2021-10-21 MED ORDER — LEVOCETIRIZINE DIHYDROCHLORIDE 5 MG PO TABS: 5.0000 mg | ORAL_TABLET | Freq: Every evening | ORAL | 0 refills | Status: DC

## 2021-10-21 NOTE — Progress Notes (Signed)
Subjective:    Patient ID: [name], male    DOB: 1940-08-12, 81 y.o.   MRN: [id number]  HPI  Htn- bp controlled. On amlodipine 10 mg daily.  CAD- hx of cabd. On pletalpraluent,  and plavix.  Vit d defiicency. Though stopped after supplamantation since level got high.  Hx chronic intermittent chest pain before I saw him in October. He has seen Dr. Gwenlyn Found and pt along with wife has gotten the impression cardiologist thinks not heart related.  Pain can occur with minimal acitivity. Also some times at night. Often times resting will resolve pain . Rest for 5-10 minutes.  Last time had minimal pain at 7 pm. Just walked outside to shed and came back inside. Rested and symptoms resolved for 5 minutes.  Wife wonders if his symptoms of chest discomfort are from reflux.Though he does not reports obvious reflux sympoms.Diet is moderate healthy.   Pt over a week of mild nasal congestion. When he took tylenol sinus he noted chest pain. Even with corcidin had some chest pain.  Some occasional dyspnea on exertion if walking fast.    Review of Systems  Constitutional:  Negative for chills, fatigue and fever.  Respiratory:  Negative for cough, chest tightness and wheezing.   Cardiovascular:  Negative for chest pain and palpitations.  Gastrointestinal:  Negative for abdominal distention, anal bleeding, constipation and nausea.  Endocrine: Negative for polydipsia and polyuria.  Genitourinary:  Negative for dysuria, flank pain, frequency and genital sores.  Musculoskeletal:  Negative for back pain and joint swelling.    Past Medical History:  Diagnosis Date   Claudication ([place]) 06/27/2011   Left ABIs abnormal values with mild-moderate arterial insufficiency, right SFA moderate amount of mixed density plaque elevating velocities suggestive 50-69% diameter reduction   Coronary artery disease 06/27/2011   Normal Lexiscan, EF 64, post-stress EF 64, no EKG changes   Diabetes mellitus without  complication (HCC)    prediabetic   Hyperlipidemia    Hypertension    Impotence of organic origin    Osteoarthritis    Other testicular hypofunction    PVD (peripheral vascular disease) ([place])      Social History   Socioeconomic History   Marital status: Married    Spouse name: cathy   Number of children: 1   Years of education: 12   Highest education level: Not on file  Occupational History   Occupation: retired  Tobacco Use   Smoking status: Former    Packs/day: 1.00    Years: 50.00    Pack years: 50.00    Types: Cigarettes    Quit date: 12/17/2004    Years since quitting: 16.8   Smokeless tobacco: Never  Vaping Use   Vaping Use: Never used  Substance and Sexual Activity   Alcohol use: Yes    Comment: occasionally   Drug use: No   Sexual activity: Not on file  Other Topics Concern   Not on file  Social History Narrative   Lives with wife   Caffeine - coffee, 1 cup daily   Social Determinants of Health   Financial Resource Strain: Not on file  Food Insecurity: Not on file  Transportation Needs: Not on file  Physical Activity: Not on file  Stress: Not on file  Social Connections: Not on file  Intimate Partner Violence: Not on file    Past Surgical History:  Procedure Laterality Date   ABDOMINAL AORTOGRAM N/A 06/28/2020   Procedure: ABDOMINAL AORTOGRAM;  Surgeon: Lorretta Harp,  MD;  Location: [facility];  Service: Cardiovascular;  Laterality: N/A;   CARDIAC CATHETERIZATION  12/26/2004   Left main-90% distal tapered stenosis, left circumflex-95% ostial stenosis, first marginal branch 99% ostial branch, posterior descending artery 99% ostial stenosis, 60-70% segmental mid stenosis, needed CABG   CORONARY ARTERY BYPASS GRAFT  12/27/2004   Left main 3-vessel disease, LIMA to LAD, saphenous vein to circumflexmarginal branch and to the PDA   INGUINAL HERNIA REPAIR Left 2005   LOWER EXTREMITY ANGIOGRAPHY N/A 04/13/2017   Procedure: LOWER EXTREMITY  ANGIOGRAPHY;  Surgeon: Lorretta Harp, MD;  Location: [facility];  Service: Cardiovascular;  Laterality: N/A;   LOWER EXTREMITY ANGIOGRAPHY Bilateral 12/06/2020   Procedure: Lower Extremity Angiography;  Surgeon: Lorretta Harp, MD;  Location: [facility];  Service: Cardiovascular;  Laterality: Bilateral;  Limited Study   LOWER EXTREMITY INTERVENTION N/A 08/04/2016   Procedure: Lower Extremity Intervention;  Surgeon: Lorretta Harp, MD;  Location: [facility];  Service: Cardiovascular;  Laterality: N/A;   PERIPHERAL VASCULAR ATHERECTOMY Left 04/13/2017   Procedure: PERIPHERAL VASCULAR ATHERECTOMY;  Surgeon: Lorretta Harp, MD;  Location: [facility];  Service: Cardiovascular;  Laterality: Left;  SFA   PERIPHERAL VASCULAR ATHERECTOMY  12/06/2020   Procedure: PERIPHERAL VASCULAR ATHERECTOMY;  Surgeon: Lorretta Harp, MD;  Location: [facility];  Service: Cardiovascular;;  Lt Iliac   PERIPHERAL VASCULAR BALLOON ANGIOPLASTY Left 04/13/2017   Procedure: PERIPHERAL VASCULAR BALLOON ANGIOPLASTY;  Surgeon: Lorretta Harp, MD;  Location: [facility];  Service: Cardiovascular;  Laterality: Left;  SFA   PERIPHERAL VASCULAR INTERVENTION  12/06/2020   Procedure: PERIPHERAL VASCULAR INTERVENTION;  Surgeon: Lorretta Harp, MD;  Location: [facility];  Service: Cardiovascular;;  Lt Iliac   RIGHT/LEFT HEART CATH AND CORONARY/GRAFT ANGIOGRAPHY N/A 06/28/2020   Procedure: RIGHT/LEFT HEART CATH AND CORONARY/GRAFT ANGIOGRAPHY;  Surgeon: Lorretta Harp, MD;  Location: [facility];  Service: Cardiovascular;  Laterality: N/A;    Family History  Problem Relation Age of Onset   Hypertension Mother        46   Clotting disorder Father        9   Diabetes Brother        heart surgery    Allergies  Allergen Reactions   Crestor [Rosuvastatin] Other (See Comments)    Myalgias   Lipitor [Atorvastatin]     myalgias   Lisinopril      cough    Current Outpatient Medications on File Prior to Visit  Medication Sig Dispense Refill   amLODipine (NORVASC) 10 MG tablet TAKE 1 TABLET BY MOUTH EVERY DAY 90 tablet 3   aspirin 81 MG tablet Take 81 mg by mouth daily.     cilostazol (PLETAL) 50 MG tablet TAKE 1 TABLET BY MOUTH TWICE A DAY 180 tablet 3   clopidogrel (PLAVIX) 75 MG tablet TAKE 1 TABLET BY MOUTH EVERY DAY 90 tablet 3   Coenzyme Q10 (COQ10) 200 MG CAPS Take 200 mg by mouth daily.     isosorbide mononitrate (IMDUR) 60 MG 24 hr tablet TAKE 1 TABLET BY MOUTH EVERY DAY 90 tablet 3   metoprolol tartrate (LOPRESSOR) 50 MG tablet Take 50 mg by mouth daily.     Omega-3 Fatty Acids (FISH OIL) 1200 MG CAPS Take 2 capsules (2,400 mg total) by mouth every morning.     PRALUENT 150 MG/ML SOAJ INJECT 150 MG INTO THE SKIN EVERY 14 (  FOURTEEN) DAYS. 2 mL 11   No current facility-administered medications on file prior to visit.    BP 109/70   Pulse 62   Temp 98.7 F (37.1 C) (Oral)   Ht 6\' 1"  (1.854 m)   Wt 222 lb 8 oz (100.9 kg)   SpO2 95%   BMI 29.36 kg/m       Objective:   Physical Exam  General Mental Status- Alert. General Appearance- Not in acute distress.   Skin General: Color- Normal Color. Moisture- Normal Moisture.  Neck Carotid Arteries- Normal color. Moisture- Normal Moisture. No carotid bruits. No JVD.  Chest and Lung Exam Auscultation: Breath Sounds:-Normal.  Anterior thorax-small area of mild tenderness to palpation fifth upper costochondral junction.  Cardiovascular Auscultation:Rythm- Regular. Murmurs & Other Heart Sounds:Auscultation of the heart reveals- No Murmurs.  Abdomen Inspection:-Inspeection Normal. Palpation/Percussion:Note:No mass. Palpation and Percussion of the abdomen reveal- Non Tender, Non Distended + BS, no rebound or guarding.   Neurologic Cranial Nerve exam:- CN III-XII intact(No nystagmus), symmetric smile. Drift Test:- No drift. Romberg Exam:- Negative.  Heal to Toe  Gait exam:-Normal. Finger to Nose:- Normal/Intact Strength:- 5/5 equal and symmetric strength both upper and lower extremities.   Lower extremity- no pedal edema.  Negative Homans' sign.    Assessment & Plan:   Patient Instructions  Hypertension-blood pressure well controlled today.  Continue amlodipine 10 mg daily.  Coronary artery disease-history of CABG.  Continue pletalpraluent  and plavix.  Chronic intermittent chest pain occurring 3 to 4 days a week and last time it occurred was brief at 7 PM last night.  And no symptoms presently during interview.  EKG showed normal sinus rhythm.  With your medical history decided to get 1 set of troponin stat.  If you do have troponin elevation then will advise emergency department evaluation.  Also if you were to have chest discomfort that does not resolve with rest would also advise emergency department evaluation.  Continue Imdur daily.  Will refer you back to Dr. Gwenlyn Found for evaluation and treatment.  Ask if they can see you within a couple weeks.  Some occasional intermittent dyspnea on exertion.  Will get CMP, BNP and chest x-ray.  Discussed differential diagnoses for your intermittent chest pain.  With above history this would need to be cardiac cause would need to be considered again as discussed.  Do wonder if GERD could be a cause.  No overt symptoms but we will go ahead and prescribe famotidine.  Also discussed differential diagnosis of costochondritis though your description of pain not consistent.  More than 1 week of minimal nasal congestion with some occasional sneezing.  Some postnasal drainage on exam.  Not to use any over-the-counter decongestants and stop course I did as well.  Did prescribe Flonase for probable allergies and Xyzal antihistamine to use at night.  We will also get fasting lipid panel today.  Follow-up with me in 3 weeks or sooner if needed.   Mackie Pai, PA-C   Time spent with patient today was 44  minutes which  consisted of chart review, discussing diagnosis, work up treatment and documentation.

## 2021-10-21 NOTE — Addendum Note (Signed)
Addended by: Gwenevere Abbot on: 10/21/2021 12:47 PM   Modules accepted: Orders

## 2021-10-21 NOTE — Patient Instructions (Addendum)
Hypertension-blood pressure well controlled today.  Continue amlodipine 10 mg daily.  Coronary artery disease-history of CABG.  Continue pletalpraluent  and plavix.  Chronic intermittent chest pain occurring 3 to 4 days a week and last time it occurred was brief at 7 PM last night.  And no symptoms presently during interview.  EKG showed normal sinus rhythm.  With your medical history decided to get 1 set of troponin stat.  If you do have troponin elevation then will advise emergency department evaluation.  Also if you were to have chest discomfort that does not resolve with rest would also advise emergency department evaluation.  Continue Imdur daily.  Will refer you back to Dr. Allyson Sabal for evaluation and treatment.  Ask if they can see you within a couple weeks.  EKG today shows normal sinus rhythm.  Some occasional intermittent dyspnea on exertion.  Will get CMP, BNP and chest x-ray.  Discussed differential diagnoses for your intermittent chest pain.  With above history  would need to consider cardiac cause. GERD could be a cause but less likely..  No overt symptoms but we will go ahead and prescribe famotidine.  Also discussed differential diagnosis of costochondritis though your description of pain not consistent.  More than 1 week of minimal nasal congestion with some occasional sneezing.  Some postnasal drainage on exam.  Not to use any over-the-counter decongestants and stop course I did as well.  Did prescribe Flonase for probable allergies and Xyzal antihistamine to use at night.  We will also get fasting lipid panel today.  Follow-up with me in 3 weeks or sooner if needed.

## 2021-11-12 ENCOUNTER — Encounter: Payer: Self-pay | Admitting: Physician Assistant

## 2021-11-12 ENCOUNTER — Other Ambulatory Visit: Payer: Self-pay | Admitting: Medical

## 2021-11-12 ENCOUNTER — Ambulatory Visit (INDEPENDENT_AMBULATORY_CARE_PROVIDER_SITE_OTHER): Payer: Medicare HMO | Admitting: Physician Assistant

## 2021-11-12 VITALS — BP 122/66 | HR 60 | Ht 73.0 in | Wt 222.6 lb

## 2021-11-12 DIAGNOSIS — E785 Hyperlipidemia, unspecified: Secondary | ICD-10-CM | POA: Diagnosis not present

## 2021-11-12 DIAGNOSIS — I25709 Atherosclerosis of coronary artery bypass graft(s), unspecified, with unspecified angina pectoris: Secondary | ICD-10-CM | POA: Diagnosis not present

## 2021-11-12 DIAGNOSIS — I10 Essential (primary) hypertension: Secondary | ICD-10-CM | POA: Diagnosis not present

## 2021-11-12 DIAGNOSIS — R7303 Prediabetes: Secondary | ICD-10-CM

## 2021-11-12 DIAGNOSIS — I739 Peripheral vascular disease, unspecified: Secondary | ICD-10-CM | POA: Diagnosis not present

## 2021-11-12 MED ORDER — NITROGLYCERIN 0.4 MG SL SUBL: 0.4000 mg | SUBLINGUAL_TABLET | SUBLINGUAL | 3 refills | Status: DC | PRN

## 2021-11-12 NOTE — Progress Notes (Signed)
Cardiology Office Note:    Date:  11/13/2021   ID:  [name], DOB [birth year], MRN [id number]  PCP:  Marisue Brooklyn   [facility] Providers Cardiologist:  Nanetta Batty, MD     Referring MD: Marisue Brooklyn   Chief Complaint  Patient presents with   Follow-up    Seen for Dr. Allyson Sabal    History of Present Illness:    [name] is a 81 y.o. male with a hx of CAD s/p CABG (LIMA-LAD, SVG-left circumflex marginal branch, SVG-PDA) by Dr. Cornelius Moras 12/27/2004, HTN, HLD, prediabetes and PAD.  Myoview performed in February 2013 was nonischemic.  He underwent peripheral angiography in March 2018 revealing a long calcified occlusion in the right SFA and a high-grade diffuse calcified left SFA stenosis.  Dr. Gery Pray did not think patient could be percutaneously revascularized on the right, therefore opted for medical therapy.  He was placed on Pletal and the medication was uptitrated to 100 mg twice a day.  He underwent peripheral angiography and rotational atherectomy of highly calcified and obstructive left SFA.  Myoview in February 2021 was negative.  Cardiac catheterization performed on 06/28/2020 revealed left dominant system, normal LV function, patent SVG to distal left PDA, left circumflex branch and patent LIMA to LAD.  He had a 90% apical LAD stenosis after LIMA insertion.  Postprocedure, he had a improvement of his symptoms despite no intervention.  Dr. Allyson Sabal also performed abdominal aortography and bilateral iliac angiography revealing nonobstructive disease.  The etiology of his chest pain cannot be explained by the finding of the heart cath.  He had another peripheral angiography performed in July 2022 revealing occluded SFA bilaterally with 3 vessel runoff, 90% stenosis in distal left common iliac artery.  He underwent orbital arthrectomy, PTA and covered stenting.  Follow-up study showed unchanged ABI although his velocity improved and so was his claudication.  He was last seen by  Dr. Allyson Sabal in November 2022 at which time he was doing well with occasional atypical noncardiac left upper chest pain.  He was seen by his PCP in early June of this year, at which time he described intermittent chest pain.  Troponin was obtained in the office which came back negative.  He was referred to cardiology service for further assessment.  His cholesterol was very well controlled.  Patient presents today for follow-up.  He continues to have chronic chest discomfort.  He described it as a sharp stabbing pain on the left side of the chest.  It typically occurs about once every 2 weeks.  Last episode was 2 weeks ago.  He says that episode from 2 weeks ago was particularly intense and lasted about 10 to 15 minutes however has not recurred since.  The episode occurred while he was going to bed.  However his chest pain does not seems to correlate with the degree of exertion.  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  I discussed his case with Dr. Allyson Sabal, we do not recommend additional work-up at this time.  He can follow-up with Dr. Allyson Sabal in November or December.  I gave him a prescription for sublingual nitroglycerin.    Past Medical History:  Diagnosis Date   Claudication (HCC) 06/27/2011   Left ABIs abnormal values with mild-moderate arterial insufficiency, right SFA moderate amount of mixed density plaque elevating velocities suggestive 50-69% diameter reduction   Coronary artery disease 06/27/2011  Normal Lexiscan, EF 64, post-stress EF 64, no EKG changes   Diabetes mellitus without complication (HCC)    prediabetic   Hyperlipidemia    Hypertension    Impotence of organic origin    Osteoarthritis    Other testicular hypofunction    PVD (peripheral vascular disease) (HCC)     Past Surgical History:  Procedure Laterality Date   ABDOMINAL AORTOGRAM N/A 06/28/2020   Procedure: ABDOMINAL AORTOGRAM;   Surgeon: [name], MD;  [facility];  Service: Cardiovascular;  Laterality: N/A;   CARDIAC CATHETERIZATION  12/26/2004   Left main-90% distal tapered stenosis, left circumflex-95% ostial stenosis, first marginal branch 99% ostial branch, posterior descending artery 99% ostial stenosis, 60-70% segmental mid stenosis, needed CABG   CORONARY ARTERY BYPASS GRAFT  12/27/2004   Left main 3-vessel disease, LIMA to LAD, saphenous vein to circumflexmarginal branch and to the PDA   INGUINAL HERNIA REPAIR Left 2005   LOWER EXTREMITY ANGIOGRAPHY N/A 04/13/2017   Procedure: LOWER EXTREMITY ANGIOGRAPHY;  Surgeon: [name], MD;  Location: [facility];  Service: Cardiovascular;  Laterality: N/A;   LOWER EXTREMITY ANGIOGRAPHY Bilateral 12/06/2020   Procedure: Lower Extremity Angiography;  Surgeon: [name], MD;  Location: [facility] INVASIVE CV LAB;  Service: Cardiovascular;  Laterality: Bilateral;  Limited Study   LOWER EXTREMITY INTERVENTION N/A 08/04/2016   Procedure: Lower Extremity Intervention;  Surgeon: [name], MD;  Location: [facility] INVASIVE CV LAB;  Service: Cardiovascular;  Laterality: N/A;   PERIPHERAL VASCULAR ATHERECTOMY Left 04/13/2017   Procedure: PERIPHERAL VASCULAR ATHERECTOMY;  Surgeon: [name], MD;  Location: [facility] INVASIVE CV LAB;  Service: Cardiovascular;  Laterality: Left;  SFA   PERIPHERAL VASCULAR ATHERECTOMY  12/06/2020   Procedure: PERIPHERAL VASCULAR ATHERECTOMY;  Surgeon: [name], MD;  Location: [facility] INVASIVE CV LAB;  Service: Cardiovascular;;  Lt Iliac   PERIPHERAL VASCULAR BALLOON ANGIOPLASTY Left 04/13/2017   Procedure: PERIPHERAL VASCULAR BALLOON ANGIOPLASTY;  Surgeon: [name], MD;  [facility];  Service: Cardiovascular;  Laterality: Left;  SFA   PERIPHERAL VASCULAR INTERVENTION  12/06/2020   Procedure: PERIPHERAL VASCULAR INTERVENTION;  Surgeon: [name], MD;  [facility]: [facility];   Service: Cardiovascular;;  Lt Iliac   RIGHT/LEFT HEART CATH AND CORONARY/GRAFT ANGIOGRAPHY N/A 06/28/2020   Procedure: RIGHT/LEFT HEART CATH AND CORONARY/GRAFT ANGIOGRAPHY;  Surgeon: [name], MD;  [facility]: [facility];  Service: Cardiovascular;  Laterality: N/A;    Current Medications: Current Meds  Medication Sig   amLODipine (NORVASC) 10 MG tablet TAKE 1 TABLET BY MOUTH EVERY DAY   aspirin 81 MG tablet Take 81 mg by mouth daily.   cilostazol (PLETAL) 50 MG tablet TAKE 1 TABLET BY MOUTH TWICE A DAY   clopidogrel (PLAVIX) 75 MG tablet TAKE 1 TABLET BY MOUTH EVERY DAY   Coenzyme Q10 (COQ10) 200 MG CAPS Take 200 mg by mouth daily.   isosorbide mononitrate (IMDUR) 60 MG 24 hr tablet TAKE 1 TABLET BY MOUTH EVERY DAY   metoprolol tartrate (LOPRESSOR) 50 MG tablet Take 50 mg by mouth daily.   Omega-3 Fatty Acids (FISH OIL) 1200 MG CAPS Take 2 capsules (2,400 mg total) by mouth every morning.   PRALUENT 150 MG/ML SOAJ INJECT 150 MG INTO THE SKIN EVERY 14 (FOURTEEN) DAYS.   [DISCONTINUED] famotidine (PEPCID) 20 MG tablet Take 1 tablet (20 mg total) by mouth daily.   [DISCONTINUED] levocetirizine (XYZAL) 5 MG tablet Take 1 tablet (  5 mg total) by mouth every evening.   [DISCONTINUED] nitroGLYCERIN (NITROSTAT) 0.4 MG SL tablet Place 1 tablet (0.4 mg total) under the tongue every 5 (five) minutes as needed for chest pain.     Allergies:   Crestor [rosuvastatin], Lipitor [atorvastatin], and Lisinopril   Social History   Socioeconomic History   Marital status: Married    Spouse name: cathy   Number of children: 1   Years of education: 12   Highest education level: Not on file  Occupational History   Occupation: retired  Tobacco Use   Smoking status: Former    Packs/day: 1.00    Years: 50.00    Total pack years: 50.00    Types: Cigarettes    Quit date: 12/17/2004    Years since quitting: 16.9   Smokeless tobacco: Never  Vaping Use   Vaping Use: Never used  Substance and  Sexual Activity   Alcohol use: Yes    Comment: occasionally   Drug use: No   Sexual activity: Not on file  Other Topics Concern   Not on file  Social History Narrative   Lives with wife   Caffeine - coffee, 1 cup daily   Social Determinants of Health   Financial Resource Strain: Not on file  Food Insecurity: Not on file  Transportation Needs: Not on file  Physical Activity: Not on file  Stress: Not on file  Social Connections: Not on file     Family History: The patient's family history includes Clotting disorder in his father; Diabetes in his brother; Hypertension in his mother.  ROS:   Please see the history of present illness.     All other systems reviewed and are negative.  EKGs/Labs/Other Studies Reviewed:    The following studies were reviewed today:  Cath 06/28/2020 Ost LM to Mid LM lesion is 90% stenosed. Ost Cx lesion is 90% stenosed. Prox Cx lesion is 100% stenosed. Prox LAD to Mid LAD lesion is 100% stenosed. Dist LAD lesion is 90% stenosed. The left ventricular systolic function is normal. LV end diastolic pressure is normal. The left ventricular ejection fraction is 55-65% by visual estimate.  EKG:  EKG is ordered today.  The ekg ordered today demonstrates normal sinus rhythm, NSR, no significant ST-T wave changes  Recent Labs: 12/07/2020: Hemoglobin 13.0; Platelets 226 10/21/2021: ALT 27; Brain Natriuretic Peptide 30; BUN 15; Creatinine, Ser 0.72; Potassium 4.0; Sodium 140  Recent Lipid Panel    Component Value Date/Time   CHOL 68 10/21/2021 0854   CHOL 72 (L) 10/09/2020 0906   TRIG 63.0 10/21/2021 0854   HDL 40.30 10/21/2021 0854   HDL 42 10/09/2020 0906   CHOLHDL 2 10/21/2021 0854   VLDL 12.6 10/21/2021 0854   LDLCALC 16 10/21/2021 0854   LDLCALC 15 10/09/2020 0906     Risk Assessment/Calculations:           Physical Exam:    VS:  BP 122/66 (BP Location: Left Arm, Patient Position: Sitting, Cuff Size: Large)   Pulse 60   Ht 6\' 1"   (1.854 m)   Wt 222 lb 9.6 oz (101 kg)   SpO2 94%   BMI 29.37 kg/m     Wt Readings from Last 3 Encounters:  11/13/21 222 lb 6.4 oz (100.9 kg)  11/12/21 222 lb 9.6 oz (101 kg)  10/21/21 222 lb 8 oz (100.9 kg)     GEN:  Well nourished, well developed in no acute distress HEENT: Normal NECK: No JVD; No carotid bruits  LYMPHATICS: No lymphadenopathy CARDIAC: RRR, no murmurs, rubs, gallops RESPIRATORY:  Clear to auscultation without rales, wheezing or rhonchi  ABDOMEN: Soft, non-tender, non-distended MUSCULOSKELETAL:  No edema; No deformity  SKIN: Warm and dry NEUROLOGIC:  Alert and oriented x 3 PSYCHIATRIC:  Normal affect   ASSESSMENT:    1. Coronary artery disease involving coronary bypass graft of native heart with angina pectoris (HCC)   2. Primary hypertension   3. Hyperlipidemia LDL goal <70   4. PAD (peripheral artery disease) (HCC)   5. Prediabetes    PLAN:    In order of problems listed above:  CAD: Patient continued to have intermittent chest discomfort despite reassuring cardiac catheterization in February 2022, last episode was 2 weeks ago.  He described as a sharp stabbing pain on the left side of the chest that does not occur with physical exertion and typically occurs at rest.  He can do strenuous activity without worsening symptoms.  This is the same chest pain he had prior to the cardiac catheterization last year.  I reviewed his case with Dr. Allyson Sabal, at this time we do not recommend any further ischemic work-up.  I gave him a prescription for sublingual nitroglycerin.  Continue aspirin and Plavix.  Hypertension: Blood pressure stable  Hyperlipidemia: On Praluent and fish oil  PAD: On Pletal  Prediabetes: Followed by primary care provider.           Medication Adjustments/Labs and Tests Ordered: Current medicines are reviewed at length with the patient today.  Concerns regarding medicines are outlined above.  Orders Placed This Encounter  Procedures    EKG 12-Lead   Meds ordered this encounter  Medications   DISCONTD: nitroGLYCERIN (NITROSTAT) 0.4 MG SL tablet    Sig: Place 1 tablet (0.4 mg total) under the tongue every 5 (five) minutes as needed for chest pain.    Dispense:  90 tablet    Refill:  3   nitroGLYCERIN (NITROSTAT) 0.4 MG SL tablet    Sig: Place 1 tablet (0.4 mg total) under the tongue every 5 (five) minutes as needed for chest pain.    Dispense:  25 tablet    Refill:  3    Patient Instructions  Medication Instructions:  TAKE Nitroglycerin 0.4 mg as needed for chest pain  *If you need a refill on your cardiac medications before your next appointment, please call your pharmacy*  Lab Work: NONE ordered at this time of appointment   If you have labs (blood work) drawn today and your tests are completely normal, you will receive your results only by: MyChart Message (if you have MyChart) OR A paper copy in the mail If you have any lab test that is abnormal or we need to change your treatment, we will call you to review the results.  Testing/Procedures: NONE ordered at this time of appointment   Follow-Up: At [facility], you and your health needs are our priority.  As part of our continuing mission to provide you with exceptional heart care, we have created designated Provider Care Teams.  These Care Teams include your primary Cardiologist (physician) and Advanced Practice Providers (APPs -  Physician Assistants and Nurse Practitioners) who all work together to provide you with the care you need, when you need it.  Your next appointment:   5 month(s)  The format for your next appointment:   In Person  Provider:   Nanetta Batty, MD     Other Instructions   Important Information About Sugar  Richard DialSigned, Jayshawn Colston, [place]PA  11/13/2021 11:37 PM    [facility]

## 2021-11-13 ENCOUNTER — Ambulatory Visit (INDEPENDENT_AMBULATORY_CARE_PROVIDER_SITE_OTHER): Payer: Medicare HMO | Admitting: Medical

## 2021-11-13 ENCOUNTER — Encounter: Payer: Self-pay | Admitting: Physician Assistant

## 2021-11-13 VITALS — BP 108/50 | HR 60 | Temp 98.0°F | Resp 18 | Ht 73.0 in | Wt 222.4 lb

## 2021-11-13 DIAGNOSIS — E559 Vitamin D deficiency, unspecified: Secondary | ICD-10-CM | POA: Diagnosis not present

## 2021-11-13 DIAGNOSIS — I10 Essential (primary) hypertension: Secondary | ICD-10-CM | POA: Diagnosis not present

## 2021-11-13 DIAGNOSIS — E785 Hyperlipidemia, unspecified: Secondary | ICD-10-CM | POA: Diagnosis not present

## 2021-11-13 DIAGNOSIS — R739 Hyperglycemia, unspecified: Secondary | ICD-10-CM

## 2021-11-13 DIAGNOSIS — Z23 Encounter for immunization: Secondary | ICD-10-CM

## 2021-11-13 DIAGNOSIS — R0789 Other chest pain: Secondary | ICD-10-CM | POA: Diagnosis not present

## 2021-11-13 NOTE — Patient Instructions (Addendum)
Htn- bp well controlled on amlodipine 10 mg daily.  Atypical intermittent rare chest pain with CAD. Follow cardiologist PA recommendation. Keep nitroglycerin with you.  For elevated sugar recommend low sugar diet.  For vit d def with lower level but in range recently use vit d otc 2000 iu daily.  For recent nasal congestion resolved let me know if reocurrs with allergy like symptoms. If so refill you xyzal.  PCV 20 vaccine today.  Follow up 6 months or sooner.

## 2021-11-13 NOTE — Addendum Note (Signed)
Addended by: Maximino Sarin on: 11/13/2021 08:46 AM   Modules accepted: Orders

## 2021-11-13 NOTE — Progress Notes (Signed)
Subjective:    Patient ID: [name], male    DOB: [birth year], 81 y.o.   MRN: [id number]  HPI Pt in for follow up.   He states saw cardiologist office yesterday. Pt saw PA cardiologist yesterday. Pt states week I last saw pt he had 10 minutes of chest area pain at night at rest. No pain since then.   On review PA note states.   "I discussed his case with Dr. Gery Pray, we do not recommend additional work-up at this time.  He can follow-up with Dr. Gery Pray in November or December.  I gave him a prescription for sublingual nitroglycerin."   Pt nasal congestion on last visit. Pt got better without nasal spray but did use xyzal.   Htn- bp is still very well controlled. Amlodoipine 10 mg q day.  Mild sugar elevation in the past.     Review of Systems  Constitutional:  Negative for chills, fatigue and fever.  Respiratory:  Negative for cough, chest tightness and wheezing.   Cardiovascular:  Negative for chest pain and palpitations.  Gastrointestinal:  Negative for abdominal distention, anal bleeding, constipation and nausea.  Endocrine: Negative for polydipsia and polyuria.  Genitourinary:  Negative for dysuria, flank pain, frequency and genital sores.  Musculoskeletal:  Negative for back pain and joint swelling.    Past Medical History:  Diagnosis Date   Claudication (HCC) 06/27/2011   Left ABIs abnormal values with mild-moderate arterial insufficiency, right SFA moderate amount of mixed density plaque elevating velocities suggestive 50-69% diameter reduction   Coronary artery disease 06/27/2011   Normal Lexiscan, EF 64, post-stress EF 64, no EKG changes   Diabetes mellitus without complication (HCC)    prediabetic   Hyperlipidemia    Hypertension    Impotence of organic origin    Osteoarthritis    Other testicular hypofunction    PVD (peripheral vascular disease) (HCC)      Social History   Socioeconomic History   Marital status: Married    Spouse name: cathy   Number of  children: 1   Years of education: 12   Highest education level: Not on file  Occupational History   Occupation: retired  Tobacco Use   Smoking status: Former    Packs/day: 1.00    Years: 50.00    Total pack years: 50.00    Types: Cigarettes    Quit date: 12/17/2004    Years since quitting: 16.9   Smokeless tobacco: Never  Vaping Use   Vaping Use: Never used  Substance and Sexual Activity   Alcohol use: Yes    Comment: occasionally   Drug use: No   Sexual activity: Not on file  Other Topics Concern   Not on file  Social History Narrative   Lives with wife   Caffeine - coffee, 1 cup daily   Social Determinants of Health   Financial Resource Strain: Not on file  Food Insecurity: Not on file  Transportation Needs: Not on file  Physical Activity: Not on file  Stress: Not on file  Social Connections: Not on file  Intimate Partner Violence: Not on file    Past Surgical History:  Procedure Laterality Date   ABDOMINAL AORTOGRAM N/A 06/28/2020   Procedure: ABDOMINAL AORTOGRAM;  Surgeon: [name], MD;  [facility]: [facility];  Service: Cardiovascular;  Laterality: N/A;   CARDIAC CATHETERIZATION  12/26/2004   Left main-90% distal tapered stenosis, left circumflex-95% ostial stenosis, first marginal branch 99% ostial branch, posterior descending artery 99%  ostial stenosis, 60-70% segmental mid stenosis, needed CABG   CORONARY ARTERY BYPASS GRAFT  12/27/2004   Left main 3-vessel disease, LIMA to LAD, saphenous vein to circumflexmarginal branch and to the PDA   INGUINAL HERNIA REPAIR Left 2005   LOWER EXTREMITY ANGIOGRAPHY N/A 04/13/2017   Procedure: LOWER EXTREMITY ANGIOGRAPHY;  Surgeon: [name], MD;  Location: [facility];  Service: Cardiovascular;  Laterality: N/A;   LOWER EXTREMITY ANGIOGRAPHY Bilateral 12/06/2020   Procedure: Lower Extremity Angiography;  Surgeon: [name], MD;  Location: [facility] INVASIVE CV LAB;  Service: Cardiovascular;   Laterality: Bilateral;  Limited Study   LOWER EXTREMITY INTERVENTION N/A 08/04/2016   Procedure: Lower Extremity Intervention;  Surgeon: [name], MD;  Location: [facility] INVASIVE CV LAB;  Service: Cardiovascular;  Laterality: N/A;   PERIPHERAL VASCULAR ATHERECTOMY Left 04/13/2017   Procedure: PERIPHERAL VASCULAR ATHERECTOMY;  Surgeon: [name], MD;  Location: [facility] INVASIVE CV LAB;  Service: Cardiovascular;  Laterality: Left;  SFA   PERIPHERAL VASCULAR ATHERECTOMY  12/06/2020   Procedure: PERIPHERAL VASCULAR ATHERECTOMY;  Surgeon: [name], MD;  Location: [facility] INVASIVE CV LAB;  Service: Cardiovascular;;  Lt Iliac   PERIPHERAL VASCULAR BALLOON ANGIOPLASTY Left 04/13/2017   Procedure: PERIPHERAL VASCULAR BALLOON ANGIOPLASTY;  Surgeon: [name], MD;  [facility]: [facility];  Service: Cardiovascular;  Laterality: Left;  SFA   PERIPHERAL VASCULAR INTERVENTION  12/06/2020   Procedure: PERIPHERAL VASCULAR INTERVENTION;  Surgeon: [name], MD;  [facility]: [facility] LAB;  Service: Cardiovascular;;  Lt Iliac   RIGHT/LEFT HEART CATH AND CORONARY/GRAFT ANGIOGRAPHY N/A 06/28/2020   Procedure: RIGHT/LEFT HEART CATH AND CORONARY/GRAFT ANGIOGRAPHY;  Surgeon: [name], MD;  Location: [facility] INVAS[facility] LAB;  Service: Cardiovascular;  Laterality: N/A;    Family History  Problem Relation Age of Onset   Hypertension Mother        29   Clotting disorder Father        46   Diabetes Brother        heart surgery    Allergies  Allergen Reactions   Crestor [Rosuvastatin] Other (See Comments)    Myalgias   Lipitor [Atorvastatin]     myalgias   Lisinopril     cough    Current Outpatient Medications on File Prior to Visit  Medication Sig Dispense Refill   amLODipine (NORVASC) 10 MG tablet TAKE 1 TABLET BY MOUTH EVERY DAY 90 tablet 3   aspirin 81 MG tablet Take 81 mg by mouth daily.     cilostazol (PLETAL) 50 MG tablet TAKE 1 TABLET BY MOUTH TWICE A DAY 180  tablet 3   clopidogrel (PLAVIX) 75 MG tablet TAKE 1 TABLET BY MOUTH EVERY DAY 90 tablet 3   Coenzyme Q10 (COQ10) 200 MG CAPS Take 200 mg by mouth daily.     famotidine (PEPCID) 20 MG tablet TAKE 1 TABLET BY MOUTH EVERY DAY 30 tablet 0   fluticasone (FLONASE) 50 MCG/ACT nasal spray Place 2 sprays into both nostrils daily. 16 g 1   isosorbide mononitrate (IMDUR) 60 MG 24 hr tablet TAKE 1 TABLET BY MOUTH EVERY DAY 90 tablet 3   levocetirizine (XYZAL) 5 MG tablet TAKE 1 TABLET BY MOUTH EVERY DAY IN THE EVENING 30 tablet 0   metoprolol tartrate (LOPRESSOR) 50 MG tablet Take 50 mg by mouth daily.     nitroGLYCERIN (NITROSTAT) 0.4 MG SL tablet Place 1 tablet (0.4 mg total) under the tongue every 5 (five) minutes  as needed for chest pain. 25 tablet 3   Omega-3 Fatty Acids (FISH OIL) 1200 MG CAPS Take 2 capsules (2,400 mg total) by mouth every morning.     PRALUENT 150 MG/ML SOAJ INJECT 150 MG INTO THE SKIN EVERY 14 (FOURTEEN) DAYS. 2 mL 11   No current facility-administered medications on file prior to visit.    BP (!) 108/50   Pulse 60   Temp 98 F (36.7 C)   Resp 18   Ht 6\' 1"  (1.854 m)   Wt 222 lb 6.4 oz (100.9 kg)   SpO2 95%   BMI 29.34 kg/m        Objective:   Physical Exam  General Mental Status- Alert. General Appearance- Not in acute distress.   Skin General: Color- Normal Color. Moisture- Normal Moisture.  Neck Carotid Arteries- Normal color. Moisture- Normal Moisture. No carotid bruits. No JVD.  Chest and Lung Exam Auscultation: Breath Sounds:-Normal.  Cardiovascular Auscultation:Rythm- Regular. Murmurs & Other Heart Sounds:Auscultation of the heart reveals- No Murmurs.  Abdomen Inspection:-Inspeection Normal. Palpation/Percussion:Note:No mass. Palpation and Percussion of the abdomen reveal- Non Tender, Non Distended + BS, no rebound or guarding.   Neurologic Cranial Nerve exam:- CN III-XII intact(No nystagmus), symmetric smile. Strength:- 5/5 equal and  symmetric strength both upper and lower extremities.       Assessment & Plan:   Patient Instructions  Htn- bp well controlled on amlodipine 10 mg daily.  Atypical intermittent rare chest pain with CAD. Follow cardiologist PA recommendation. Keep nitroglycerin with you.  For elevated sugar recommend low sugar diet.  For vit d def with lower level but in range recently use vit d otc 2000 iu daily.  For recent nasal congestion resolved let me know if reocurrs with allergy like symptoms. If so refill you xyzal.  PCV 20 vaccine today.  Follow up 6 months or sooner.   , PA-C

## 2021-12-06 ENCOUNTER — Other Ambulatory Visit: Payer: Self-pay | Admitting: Medical

## 2021-12-12 ENCOUNTER — Other Ambulatory Visit: Payer: Self-pay | Admitting: Medical

## 2021-12-12 ENCOUNTER — Ambulatory Visit (INDEPENDENT_AMBULATORY_CARE_PROVIDER_SITE_OTHER): Payer: Medicare HMO | Admitting: Medical

## 2021-12-12 VITALS — BP 112/57 | HR 65 | Resp 18 | Ht 73.0 in | Wt 217.0 lb

## 2021-12-12 DIAGNOSIS — T300 Burn of unspecified body region, unspecified degree: Secondary | ICD-10-CM

## 2021-12-12 DIAGNOSIS — L089 Local infection of the skin and subcutaneous tissue, unspecified: Secondary | ICD-10-CM | POA: Diagnosis not present

## 2021-12-12 MED ORDER — SILVER SULFADIAZINE 1 % EX CREA: 1.0000 | TOPICAL_CREAM | Freq: Two times a day (BID) | CUTANEOUS | 0 refills | Status: DC

## 2021-12-12 MED ORDER — DOXYCYCLINE HYCLATE 100 MG PO TABS: 100.0000 mg | ORAL_TABLET | Freq: Two times a day (BID) | ORAL | 0 refills | Status: DC

## 2021-12-12 NOTE — Patient Instructions (Signed)
Left great toe and 2nd toe skin burn with concern for secondary infection. Will get wound culture today from great toe area. Prescribed silvadene cream and oral doxycycline. Discussed on checking the toe is signs and symptoms change as discussed then be seen in ED.  Follow up on Monday or sooner if needed. If areas not healing or complication considered referral to wound car.

## 2021-12-12 NOTE — Progress Notes (Signed)
Subjective:    Patient ID: [name], male    DOB: 03/11/1941, 81 y.o.   MRN: [id number]  HPI  Pt in for left great toe injury. Pt states he had leather boots on. He state temp was very hot and he was riding his tractor exhaust pipe very close to his shoe. He was riding the tractor for about 4-5 hours. He state his foot felt funny and he was wearing leather boots. When he took boot off at [place] he had blister to base of toe on rt toe and second toe mid portion also had blister. He left it alone and eventually blister burst. Know skin cracked and toe is red.  Review of Systems  Constitutional:  Negative for chills, fatigue and fever.  Respiratory:  Negative for cough, chest tightness, wheezing and stridor.   Cardiovascular:  Negative for chest pain and palpitations.  Gastrointestinal:  Negative for abdominal pain.  Skin:  Positive for rash.       Skin burn  Hematological:  Negative for adenopathy. Does not bruise/bleed easily.  Psychiatric/Behavioral:  Negative for behavioral problems and confusion.    Past Medical History:  Diagnosis Date   Claudication (HCC) 06/27/2011   Left ABIs abnormal values with mild-moderate arterial insufficiency, right SFA moderate amount of mixed density plaque elevating velocities suggestive 50-69% diameter reduction   Coronary artery disease 06/27/2011   Normal Lexiscan, EF 64, post-stress EF 64, no EKG changes   Diabetes mellitus without complication (HCC)    prediabetic   Hyperlipidemia    Hypertension    Impotence of organic origin    Osteoarthritis    Other testicular hypofunction    PVD (peripheral vascular disease) (HCC)      Social History   Socioeconomic History   Marital status: Married    Spouse name: cathy   Number of children: 1   Years of education: 12   Highest education level: Not on file  Occupational History   Occupation: retired  Tobacco Use   Smoking status: Former    Packs/day: 1.00    Years: 50.00    Total pack years:  50.00    Types: Cigarettes    Quit date: 12/17/2004    Years since quitting: 16.9   Smokeless tobacco: Never  Vaping Use   Vaping Use: Never used  Substance and Sexual Activity   Alcohol use: Yes    Comment: occasionally   Drug use: No   Sexual activity: Not on file  Other Topics Concern   Not on file  Social History Narrative   Lives with wife   Caffeine - coffee, 1 cup daily   Social Determinants of Health   Financial Resource Strain: Not on file  Food Insecurity: Not on file  Transportation Needs: Not on file  Physical Activity: Not on file  Stress: Not on file  Social Connections: Not on file  Intimate Partner Violence: Not on file    Past Surgical History:  Procedure Laterality Date   ABDOMINAL AORTOGRAM N/A 06/28/2020   Procedure: ABDOMINAL AORTOGRAM;  Surgeon: [name], MD;  Location: [facility];  Service: Cardiovascular;  Laterality: N/A;   CARDIAC CATHETERIZATION  12/26/2004   Left main-90% distal tapered stenosis, left circumflex-95% ostial stenosis, first marginal branch 99% ostial branch, posterior descending artery 99% ostial stenosis, 60-70% segmental mid stenosis, needed CABG   CORONARY ARTERY BYPASS GRAFT  12/27/2004   Left main 3-vessel disease, LIMA to LAD, saphenous vein to circumflexmarginal branch and to the  PDA   INGUINAL HERNIA REPAIR Left 2005   LOWER EXTREMITY ANGIOGRAPHY N/A 04/13/2017   Procedure: LOWER EXTREMITY ANGIOGRAPHY;  Surgeon: [name], MD;  Location: [facility];  Service: Cardiovascular;  Laterality: N/A;   LOWER EXTREMITY ANGIOGRAPHY Bilateral 12/06/2020   Procedure: Lower Extremity Angiography;  Surgeon: [name], MD;  Location: [facility] INVASIVE CV LAB;  Service: Cardiovascular;  Laterality: Bilateral;  Limited Study   LOWER EXTREMITY INTERVENTION N/A 08/04/2016   Procedure: Lower Extremity Intervention;  Surgeon: [name], MD;  Location: [facility] INVASIVE CV LAB;  Service: Cardiovascular;  Laterality:  N/A;   PERIPHERAL VASCULAR ATHERECTOMY Left 04/13/2017   Procedure: PERIPHERAL VASCULAR ATHERECTOMY;  Surgeon: [name], MD;  Location: [facility] INVASIVE CV LAB;  Service: Cardiovascular;  Laterality: Left;  SFA   PERIPHERAL VASCULAR ATHERECTOMY  12/06/2020   Procedure: PERIPHERAL VASCULAR ATHERECTOMY;  Surgeon: [name], MD;  Location: [facility] INVASIVE CV LAB;  Service: Cardiovascular;;  Lt Iliac   PERIPHERAL VASCULAR BALLOON ANGIOPLASTY Left 04/13/2017   Procedure: PERIPHERAL VASCULAR BALLOON ANGIOPLASTY;  Surgeon: [name], MD;  Location: [facility];  Service: Cardiovascular;  Laterality: Left;  SFA   PERIPHERAL VASCULAR INTERVENTION  12/06/2020   Procedure: PERIPHERAL VASCULAR INTERVENTION;  Surgeon: [name], MD;  Location: [facility];  Service: Cardiovascular;;  Lt Iliac   RIGHT/LEFT HEART CATH AND CORONARY/GRAFT ANGIOGRAPHY N/A 06/28/2020   Procedure: RIGHT/LEFT HEART CATH AND CORONARY/GRAFT ANGIOGRAPHY;  Surgeon: [name], MD;  Location: [facility];  Service: Cardiovascular;  Laterality: N/A;    Family History  Problem Relation Age of Onset   Hypertension Mother        13   Clotting disorder Father        31   Diabetes Brother        heart surgery    Allergies  Allergen Reactions   Crestor [Rosuvastatin] Other (See Comments)    Myalgias   Lipitor [Atorvastatin]     myalgias   Lisinopril     cough    Current Outpatient Medications on File Prior to Visit  Medication Sig Dispense Refill   amLODipine (NORVASC) 10 MG tablet TAKE 1 TABLET BY MOUTH EVERY DAY 90 tablet 3   aspirin 81 MG tablet Take 81 mg by mouth daily.     cilostazol (PLETAL) 50 MG tablet TAKE 1 TABLET BY MOUTH TWICE A DAY 180 tablet 3   clopidogrel (PLAVIX) 75 MG tablet TAKE 1 TABLET BY MOUTH EVERY DAY 90 tablet 3   Coenzyme Q10 (COQ10) 200 MG CAPS Take 200 mg by mouth daily.     famotidine (PEPCID) 20 MG tablet TAKE 1 TABLET BY MOUTH EVERY DAY 30  tablet 0   fluticasone (FLONASE) 50 MCG/ACT nasal spray Place 2 sprays into both nostrils daily. 16 g 1   isosorbide mononitrate (IMDUR) 60 MG 24 hr tablet TAKE 1 TABLET BY MOUTH EVERY DAY 90 tablet 3   levocetirizine (XYZAL) 5 MG tablet TAKE 1 TABLET BY MOUTH EVERY DAY IN THE EVENING 30 tablet 0   metoprolol tartrate (LOPRESSOR) 50 MG tablet Take 50 mg by mouth daily.     nitroGLYCERIN (NITROSTAT) 0.4 MG SL tablet Place 1 tablet (0.4 mg total) under the tongue every 5 (five) minutes as needed for chest pain. 25 tablet 3   Omega-3 Fatty Acids (FISH OIL) 1200 MG CAPS Take 2 capsules (2,400 mg total) by mouth every morning.     PRALUENT  150 MG/ML SOAJ INJECT 150 MG INTO THE SKIN EVERY 14 (FOURTEEN) DAYS. 2 mL 11   No current facility-administered medications on file prior to visit.    BP (!) 112/57   Pulse 65   Resp 18   Ht 6\' 1"  (1.854 m)   Wt 217 lb (98.4 kg)   SpO2 93%   BMI 28.63 kg/m        Objective:   Physical Exam  General- No acute distress. Pleasant patient. Neck- Full range of motion, no jvd Lungs- Clear, even and unlabored. Heart- regular rate and rhythm. Neurologic- CNII- XII grossly intact.   Left foot- great toe base of toe shallow ulcer where blister was present. Some peeling of distal toe and bottom of toe as well. 2nd toe pinkish appreance to toe some peeling of epidermis. Some redness at base toe/distal first metatarsal.      Assessment & Plan:   Patient Instructions  Left great toe and 2nd toe skin burn with concern for secondary infection. Will get wound culture today from great toe area. Prescribed silvadene cream and oral doxycycline. Discussed on checking the toe is signs and symptoms change as discussed then be seen in ED.  Follow up on Monday or sooner if needed. If areas not healing or complication considered referral to wound car.   Mackie Pai, PA-C

## 2021-12-15 LAB — WOUND CULTURE
MICRO NUMBER:: 13702708
SPECIMEN QUALITY:: ADEQUATE

## 2021-12-16 ENCOUNTER — Ambulatory Visit (INDEPENDENT_AMBULATORY_CARE_PROVIDER_SITE_OTHER): Payer: Medicare HMO | Admitting: Medical

## 2021-12-16 VITALS — BP 112/68 | HR 65 | Resp 18 | Ht 73.0 in | Wt 219.2 lb

## 2021-12-16 DIAGNOSIS — T300 Burn of unspecified body region, unspecified degree: Secondary | ICD-10-CM

## 2021-12-16 DIAGNOSIS — L089 Local infection of the skin and subcutaneous tissue, unspecified: Secondary | ICD-10-CM | POA: Diagnosis not present

## 2021-12-16 DIAGNOSIS — T25032D Burn of unspecified degree of left toe(s) (nail), subsequent encounter: Secondary | ICD-10-CM

## 2021-12-16 DIAGNOSIS — L03116 Cellulitis of left lower limb: Secondary | ICD-10-CM

## 2021-12-16 LAB — CBC WITH DIFFERENTIAL/PLATELET
Basophils Absolute: 0.1 10*3/uL (ref 0.0–0.1)
Basophils Relative: 0.7 % (ref 0.0–3.0)
Eosinophils Absolute: 0.2 10*3/uL (ref 0.0–0.7)
Eosinophils Relative: 1.4 % (ref 0.0–5.0)
HCT: 39.5 % (ref 39.0–52.0)
Hemoglobin: 13 g/dL (ref 13.0–17.0)
Lymphocytes Relative: 18.4 % (ref 12.0–46.0)
Lymphs Abs: 2.3 10*3/uL (ref 0.7–4.0)
MCHC: 33 g/dL (ref 30.0–36.0)
MCV: 88.7 fl (ref 78.0–100.0)
Monocytes Absolute: 0.8 10*3/uL (ref 0.1–1.0)
Monocytes Relative: 6.8 % (ref 3.0–12.0)
Neutro Abs: 9 10*3/uL — ABNORMAL HIGH (ref 1.4–7.7)
Neutrophils Relative %: 72.7 % (ref 43.0–77.0)
Platelets: 291 10*3/uL (ref 150.0–400.0)
RBC: 4.45 Mil/uL (ref 4.22–5.81)
RDW: 13.9 % (ref 11.5–15.5)
WBC: 12.4 10*3/uL — ABNORMAL HIGH (ref 4.0–10.5)

## 2021-12-16 MED ORDER — CEFTRIAXONE SODIUM 1 G IJ SOLR
1.0000 g | Freq: Once | INTRAMUSCULAR | Status: AC
  Administered 2021-12-16: 1 g via INTRAMUSCULAR

## 2021-12-16 MED ORDER — CLINDAMYCIN HCL 150 MG PO CAPS: 150.0000 mg | ORAL_CAPSULE | Freq: Three times a day (TID) | ORAL | 0 refills | Status: DC

## 2021-12-16 NOTE — Patient Instructions (Addendum)
Left great toe and foot infection by exam today.  You had started on doxycycline last week and Silvadene for burn wound that occurred about 2 weeks ago.  Culture done the other day showed normal skin flora no particular bacteria.  However during the interim the area looks worse with weeping serosanguineous type of golden drainage.  Also area looks a little bit swollen redness in the distal foot region.  I will have you stop doxycycline antibiotic and will prescribe clindamycin.  Rx advisement given.  Make sure that you get probiotics over-the-counter and eat probiotic rich foods.  Got another wound culture today to see if culture will eventually come out positive.  We will get CBC today stat.  Also 1 g Rocephin IM.   Peripheral artery disease maybe playing role in slow healing. If area does not improve at all or worsens by Wednesday follow up may need to advise ED evaluation for possible iv antibiotics. If some improvement but slow healing may need to refer to wound management as has already been 2 weeks since original burn injury date.  Follow up Wednesday 11 am or sooner  if needed.

## 2021-12-16 NOTE — Progress Notes (Signed)
Subjective:    Patient ID: [name], male    DOB: [birth year], 81 y.o.   MRN: [id number]  HPI  Pt in for follow up.  Last visit was on 12-12-2021. See hpi on that visit note below  "Pt in for left great toe injury. Pt states he had leather boots on. He state temp was very hot and he was riding his tractor exhaust pipe very close to his shoe. He was riding the tractor for about 4-5 hours. He state his foot felt funny and he was wearing leather boots. When he took boot off at [place] he had blister to base of toe on rt toe and second toe mid portion also had blister. He left it alone and eventually blister burst. Know skin cracked and toe is red."     A/P on last visit.  Left great toe and 2nd toe skin burn with concern for secondary infection. Will get wound culture today from great toe area. Prescribed silvadene cream and oral doxycycline. Discussed on checking the toe is signs and symptoms change as discussed then be seen in ED.   Follow up on Monday or sooner if needed. If areas not healing or complication considered referral to wound car."  The culture results last week showed no bacteria.   I had give pt doxycycline and foot looks worse.also I had rx'd silvadene.  Tomorrow will be 2 weeks since original burn injury.   Also on review pt has peripheral artery disease. He is not diabetic.      Review of Systems  Respiratory:  Negative for cough, chest tightness, shortness of breath and wheezing.   Cardiovascular:  Negative for chest pain and palpitations.  Gastrointestinal:  Negative for abdominal pain.  Genitourinary:  Negative for dysuria and flank pain.  Musculoskeletal:  Negative for back pain and neck pain.  Skin:  Negative for rash.    Past Medical History:  Diagnosis Date   Claudication (HCC) 06/27/2011   Left ABIs abnormal values with mild-moderate arterial insufficiency, right SFA moderate amount of mixed density plaque elevating velocities suggestive 50-69%  diameter reduction   Coronary artery disease 06/27/2011   Normal Lexiscan, EF 64, post-stress EF 64, no EKG changes   Diabetes mellitus without complication (HCC)    prediabetic   Hyperlipidemia    Hypertension    Impotence of organic origin    Osteoarthritis    Other testicular hypofunction    PVD (peripheral vascular disease) (HCC)      Social History   Socioeconomic History   Marital status: Married    Spouse name: cathy   Number of children: 1   Years of education: 12   Highest education level: Not on file  Occupational History   Occupation: retired  Tobacco Use   Smoking status: Former    Packs/day: 1.00    Years: 50.00    Total pack years: 50.00    Types: Cigarettes    Quit date: 12/17/2004    Years since quitting: 17.0   Smokeless tobacco: Never  Vaping Use   Vaping Use: Never used  Substance and Sexual Activity   Alcohol use: Yes    Comment: occasionally   Drug use: No   Sexual activity: Not on file  Other Topics Concern   Not on file  Social History Narrative   Lives with wife   Caffeine - coffee, 1 cup daily   Social Determinants of Health   Financial Resource Strain: Not on file  Food Insecurity: Not  on file  Transportation Needs: Not on file  Physical Activity: Not on file  Stress: Not on file  Social Connections: Not on file  Intimate Partner Violence: Not on file    Past Surgical History:  Procedure Laterality Date   ABDOMINAL AORTOGRAM N/A 06/28/2020   Procedure: ABDOMINAL AORTOGRAM;  Surgeon: [name], MD;  Location: [facility] INVASIVE CV LAB;  Service: Cardiovascular;  Laterality: N/A;   CARDIAC CATHETERIZATION  12/26/2004   Left main-90% distal tapered stenosis, left circumflex-95% ostial stenosis, first marginal branch 99% ostial branch, posterior descending artery 99% ostial stenosis, 60-70% segmental mid stenosis, needed CABG   CORONARY ARTERY BYPASS GRAFT  12/27/2004   Left main 3-vessel disease, LIMA to LAD, saphenous vein to  circumflexmarginal branch and to the PDA   INGUINAL HERNIA REPAIR Left 2005   LOWER EXTREMITY ANGIOGRAPHY N/A 04/13/2017   Procedure: LOWER EXTREMITY ANGIOGRAPHY;  Surgeon: [name], MD;  Location: [facility];  Service: Cardiovascular;  Laterality: N/A;   LOWER EXTREMITY ANGIOGRAPHY Bilateral 12/06/2020   Procedure: Lower Extremity Angiography;  Surgeon: [name], MD;  Location: [facility] INVAS[facility] CV LAB;  Service: Cardiovascular;  Laterality: Bilateral;  Limited Study   LOWER EXTREMITY INTERVENTION N/A 08/04/2016   Procedure: Lower Extremity Intervention;  Surgeon: [name], MD;  Location: [facility] INVASIVE CV LAB;  Service: Cardiovascular;  Laterality: N/A;   PERIPHERAL VASCULAR ATHERECTOMY Left 04/13/2017   Procedure: PERIPHERAL VASCULAR ATHERECTOMY;  Surgeon: [name], MD;  Location: [facility] INVASIVE CV LAB;  Service: Cardiovascular;  Laterality: Left;  SFA   PERIPHERAL VASCULAR ATHERECTOMY  12/06/2020   Procedure: PERIPHERAL VASCULAR ATHERECTOMY;  Surgeon: [name], MD;  Location: [facility] INVASIVE CV LAB;  Service: Cardiovascular;;  Lt Iliac   PERIPHERAL VASCULAR BALLOON ANGIOPLASTY Left 04/13/2017   Procedure: PERIPHERAL VASCULAR BALLOON ANGIOPLASTY;  Surgeon: [name], MD;  Location: [facility];  Service: Cardiovascular;  Laterality: Left;  SFA   PERIPHERAL VASCULAR INTERVENTION  12/06/2020   Procedure: PERIPHERAL VASCULAR INTERVENTION;  Surgeon: [name], MD;  Location: [facility];  Service: Cardiovascular;;  Lt Iliac   RIGHT/LEFT HEART CATH AND CORONARY/GRAFT ANGIOGRAPHY N/A 06/28/2020   Procedure: RIGHT/LEFT HEART CATH AND CORONARY/GRAFT ANGIOGRAPHY;  Surgeon: [name], MD;  Location: [facility];  Service: Cardiovascular;  Laterality: N/A;    Family History  Problem Relation Age of Onset   Hypertension Mother        21   Clotting disorder Father        71   Diabetes Brother        heart surgery     Allergies  Allergen Reactions   Crestor [Rosuvastatin] Other (See Comments)    Myalgias   Lipitor [Atorvastatin]     myalgias   Lisinopril     cough    Current Outpatient Medications on File Prior to Visit  Medication Sig Dispense Refill   amLODipine (NORVASC) 10 MG tablet TAKE 1 TABLET BY MOUTH EVERY DAY 90 tablet 3   aspirin 81 MG tablet Take 81 mg by mouth daily.     cilostazol (PLETAL) 50 MG tablet TAKE 1 TABLET BY MOUTH TWICE A DAY 180 tablet 3   clopidogrel (PLAVIX) 75 MG tablet TAKE 1 TABLET BY MOUTH EVERY DAY 90 tablet 3   Coenzyme Q10 (COQ10) 200 MG CAPS Take 200 mg by mouth daily.     famotidine (PEPCID) 20 MG tablet TAKE 1 TABLET BY MOUTH EVERY DAY  30 tablet 0   fluticasone (FLONASE) 50 MCG/ACT nasal spray Place 2 sprays into both nostrils daily. 16 g 1   isosorbide mononitrate (IMDUR) 60 MG 24 hr tablet TAKE 1 TABLET BY MOUTH EVERY DAY 90 tablet 3   levocetirizine (XYZAL) 5 MG tablet TAKE 1 TABLET BY MOUTH EVERY DAY IN THE EVENING 30 tablet 0   metoprolol tartrate (LOPRESSOR) 50 MG tablet Take 50 mg by mouth daily.     nitroGLYCERIN (NITROSTAT) 0.4 MG SL tablet Place 1 tablet (0.4 mg total) under the tongue every 5 (five) minutes as needed for chest pain. 25 tablet 3   Omega-3 Fatty Acids (FISH OIL) 1200 MG CAPS Take 2 capsules (2,400 mg total) by mouth every morning.     PRALUENT 150 MG/ML SOAJ INJECT 150 MG INTO THE SKIN EVERY 14 (FOURTEEN) DAYS. 2 mL 11   silver sulfADIAZINE (SILVADENE) 1 % cream Apply 1 Application topically 2 (two) times daily. 50 g 0   No current facility-administered medications on file prior to visit.    BP 112/68   Pulse 65   Resp 18   Ht 6\' 1"  (1.854 m)   Wt 219 lb 3.2 oz (99.4 kg)   SpO2 95%   BMI 28.92 kg/m         Objective:   Physical Exam  General- No acute distress. Pleasant patient. Neck- Full range of motion, no jvd Lungs- Clear, even and unlabored. Heart- regular rate and rhythm. Neurologic- CNII- XII grossly  intact.    Left foot- great toe base of toe shallow ulcer where blister was present. Some peeling of distal toe and bottom of toe as well. 2nd toe pinkish appreance to toe some peeling of epidermis. Some redness at base toe/distal first metatarsal. More redness toward distal  metatarsal and some pain in mid foot. On isspection burn wound has seranguinous yellow/gold drainage.      Assessment & Plan:   Patient Instructions  Left great toe and foot infection by exam today.  You had started on doxycycline last week and Silvadene for burn wound that occurred about 2 weeks ago.  Culture done the other day showed normal skin flora no particular bacteria.  However during the interim the area looks worse with weeping serosanguineous type of golden drainage.  Also area looks a little bit swollen redness in the distal first and second metatarsal.  I will have you stop doxycycline antibiotic and will prescribe clindamycin.  Rx advisement given.  Make sure that you get probiotics over-the-counter and eat probiotic rich foods.  Got another wound culture today to see if culture will eventually come out positive.  We will get CBC today stat.  Also 1 g Rocephin IM.   Peripheral artery disease maybe playing role in slow healing. If area does not improve at all or worsens by Wednesday follow up may need to advise ED evaluation for possible iv antibiotics. If some improvement but slow healing may need to refer to wound management as has already been 2 weeks since original burn injury date.  Follow up Wednesday 11 am or sooner  if needed.   Thursday, PA-C

## 2021-12-18 ENCOUNTER — Ambulatory Visit (INDEPENDENT_AMBULATORY_CARE_PROVIDER_SITE_OTHER): Payer: Medicare HMO | Admitting: Medical

## 2021-12-18 VITALS — BP 119/68 | HR 65 | Resp 18 | Ht 73.0 in | Wt 218.0 lb

## 2021-12-18 DIAGNOSIS — T300 Burn of unspecified body region, unspecified degree: Secondary | ICD-10-CM

## 2021-12-18 DIAGNOSIS — T25022A Burn of unspecified degree of left foot, initial encounter: Secondary | ICD-10-CM

## 2021-12-18 DIAGNOSIS — L089 Local infection of the skin and subcutaneous tissue, unspecified: Secondary | ICD-10-CM

## 2021-12-18 NOTE — Progress Notes (Signed)
   Subjective:    Patient ID: [name], male    DOB: 02-09-1941, 81 y.o.   MRN: [id number]  HPI  Pt in for follow up.  Burn injury 2 weeks ago. See last visit note 12-16-2021.  Pt states foot overall feels better. Repeat cultures did not reveal bacteria. Though infection has been concern.  Pt is on clindamycin as on follow up from initial visit  he was not improving with doxycyclne  He is not having any fever, no chills or sweats. He is on probiotics tabs and eating eating yogurt.(Also bar with probiotics).  No diarrhea reported.  Pt states will be going on vacation next Wednesday.   Review of Systems See hpi    Objective:   Physical Exam  Left foot- great toe base of toe shallow ulcer where blister was present. Some peeling of distal toe and bottom of toe as well. 2nd toe pinkish appreance to toe some peeling of epidermis. Mild less redness at base toe/distal first metatarsal. Mild redness toward distal  metatarsal and some pain in mid foot. On isspection burn wound I don't see serosanuinous golden water dc today.        Patient Instructions  Left foot skin burn with delayed healing and concern for infection despite culture stating no growth. Recommend continue clindamycin antibiotic and silvadene.  I went ahead and placed referral to wound care as I do have concern other factors such as peripheral artery disease factor in delayed healing.  Follow Tuesday with me or sooner if needed.   [name], PA-C

## 2021-12-18 NOTE — Patient Instructions (Addendum)
Left foot skin burn with delayed healing and concern for infection despite culture stating no growth. Recommend continue clindamycin antibiotic and silvadene.  Continue probiotics. If diarrhea starts please let me know.  I went ahead and placed referral to wound care as I do have concern other factors such as peripheral artery disease factor in delayed healing.  Follow Tuesday with me or sooner if needed.

## 2021-12-19 ENCOUNTER — Other Ambulatory Visit: Payer: Self-pay

## 2021-12-19 ENCOUNTER — Telehealth: Payer: Self-pay | Admitting: Medical

## 2021-12-19 LAB — WOUND CULTURE
MICRO NUMBER:: 13714607
SPECIMEN QUALITY:: ADEQUATE

## 2021-12-19 MED ORDER — CEPHALEXIN 500 MG PO CAPS
500.0000 mg | ORAL_CAPSULE | Freq: Two times a day (BID) | ORAL | 0 refills | Status: DC
Start: 2021-12-19 — End: 2022-01-02

## 2021-12-19 MED ORDER — CIPROFLOXACIN HCL 500 MG PO TABS: 500.0000 mg | ORAL_TABLET | Freq: Two times a day (BID) | ORAL | 0 refills | Status: DC

## 2021-12-19 NOTE — Addendum Note (Signed)
Addended by: Gwenevere Abbot on: 12/19/2021 04:04 PM   Modules accepted: Orders

## 2021-12-19 NOTE — Telephone Encounter (Signed)
Pharm called and stated there was an interaction between ciprofloxacin (CIPRO) 500 MG tablet and cilostazol (PLETAL) 50 MG tablet. Heather advised and Dr.Lowne is sending in another antibiotic.

## 2021-12-20 ENCOUNTER — Encounter: Payer: Self-pay | Admitting: Family Medicine

## 2021-12-20 ENCOUNTER — Ambulatory Visit (INDEPENDENT_AMBULATORY_CARE_PROVIDER_SITE_OTHER): Payer: Medicare HMO | Admitting: Family Medicine

## 2021-12-20 VITALS — BP 120/51 | HR 64 | Temp 97.8°F | Ht 73.0 in | Wt 220.0 lb

## 2021-12-20 DIAGNOSIS — L03116 Cellulitis of left lower limb: Secondary | ICD-10-CM | POA: Diagnosis not present

## 2021-12-20 MED ORDER — CEFTRIAXONE SODIUM 1 G IJ SOLR
1.0000 g | Freq: Once | INTRAMUSCULAR | Status: AC
  Administered 2021-12-20: 1 g via INTRAMUSCULAR

## 2021-12-20 NOTE — Progress Notes (Signed)
Left foot swelling 

## 2021-12-20 NOTE — Patient Instructions (Signed)
Discussed with Ramon Dredge (PCP). We will give you another shot of Rocephin (antibiotic), then start the new antibiotics they sent in for you last night. Continue with topical cream/wound care. Elevated your leg as often as possible to help the swelling. Keep clean. Monitor for any spreading redness, swelling, worsening pain, and go to the ED over the weekend for any severe symptoms.  Keep follow-up with Ramon Dredge next week to recheck and wound care appointment for later this month.

## 2021-12-20 NOTE — Progress Notes (Signed)
   Acute Office Visit  Subjective:     Patient ID: [name], male    DOB: November 06, 1940, 81 y.o.   MRN: [id number]  CC: wound check, swelling   HPI Patient is in today for wound check, swelling.  He has been following with PCP after burning left toes. He has been on antibiotics since Monday and is set to see wound care team in a few weeks.   Relative states wound itself looks better, but the discoloration and swelling is not improving. They wanted to be sure everything was fine before going into the weekend. He denies any changes to sensation, spreading erythema, etc.     ROS All review of systems negative except what is listed in the HPI      Objective:    BP (!) 120/51   Pulse 64   Temp 97.8 F (36.6 C)   Ht 6\' 1"  (1.854 m)   Wt 220 lb (99.8 kg)   BMI 29.03 kg/m    Physical Exam Vitals reviewed.  Constitutional:      Appearance: Normal appearance.  Skin:    General: Skin is warm and dry.     Comments: See picture of left foot - quick cap refill, +1 pitting to foot, not extending into lower leg, no spreading erythema, warmth  Neurological:     General: No focal deficit present.     Mental Status: He is alert and oriented to person, place, and time. Mental status is at baseline.  Psychiatric:        Mood and Affect: Mood normal.        Behavior: Behavior normal.        Thought Content: Thought content normal.        Judgment: Judgment normal.             No results found for any visits on 12/20/21.      Assessment & Plan:   Problem List Items Addressed This Visit   None Visit Diagnoses     Cellulitis of left foot    -  Primary Discussed with 02/19/22 (PCP). We will give you another shot of Rocephin (antibiotic), then start the new antibiotics they sent in for you last night. Continue with topical cream/wound care. Elevate your leg as often as possible to help the swelling. Keep clean. Monitor for any spreading redness, swelling, worsening pain,  and go to the ED over the weekend for any severe symptoms.  Keep follow-up with Ramon Dredge next week to recheck and wound care appointment for later this month.     Relevant Medications   cefTRIAXone (ROCEPHIN) injection 1 g (Completed)       Meds ordered this encounter  Medications   cefTRIAXone (ROCEPHIN) injection 1 g    Return for [place] next week for wound check .  Ramon Dredge, NP

## 2021-12-24 ENCOUNTER — Ambulatory Visit: Payer: Medicare HMO | Admitting: Medical

## 2021-12-24 ENCOUNTER — Encounter: Payer: Self-pay | Admitting: Medical

## 2021-12-24 ENCOUNTER — Ambulatory Visit (INDEPENDENT_AMBULATORY_CARE_PROVIDER_SITE_OTHER): Payer: Medicare HMO | Admitting: Medical

## 2021-12-24 VITALS — BP 108/60 | HR 63 | Temp 98.0°F | Ht 73.0 in | Wt 219.4 lb

## 2021-12-24 DIAGNOSIS — T300 Burn of unspecified body region, unspecified degree: Secondary | ICD-10-CM | POA: Diagnosis not present

## 2021-12-24 DIAGNOSIS — I739 Peripheral vascular disease, unspecified: Secondary | ICD-10-CM

## 2021-12-24 DIAGNOSIS — L089 Local infection of the skin and subcutaneous tissue, unspecified: Secondary | ICD-10-CM

## 2021-12-24 MED ORDER — CEPHALEXIN 500 MG PO CAPS: 500.0000 mg | ORAL_CAPSULE | Freq: Two times a day (BID) | ORAL | 0 refills | Status: DC

## 2021-12-24 MED ORDER — CEFTRIAXONE SODIUM 1 G IJ SOLR
1.0000 g | Freq: Once | INTRAMUSCULAR | Status: AC
  Administered 2021-12-24: 1 g via INTRAMUSCULAR

## 2021-12-24 NOTE — Patient Instructions (Addendum)
Left foot skin infection secondary to burn. Improving overall. Continue keflex and offered rocephin 1 gram injection. Agreed to injection.   Continue silvadene.  Rx 3 more days kefex to carry you thru to wound care appt on 16th.  Follow up with [place] as needed before or after wound care if needed.

## 2021-12-24 NOTE — Addendum Note (Signed)
Addended by: Lisbeth Renshaw, Rollen Selders HUA on: 12/24/2021 09:00 AM   Modules accepted: Orders

## 2021-12-24 NOTE — Progress Notes (Signed)
Subjective:    Patient ID: [name], male    DOB: 10/06/1940, 81 y.o.   MRN: [id number]  HPI Pt in for follow up.  Pt has left foot infection. This has lingered despited antibtiotic use. See prior visits. His second wound culture came back showing light growth of pseudomonas.   I had wanted to rx cipro intially but could not due to interaction pointed out by pharmacist. Dr. Laury Axon sent in keflex. Pt followed up with NP taylor beck and she reviewed his office visit with me. I recommended rocephin.   Overall wife states better. Not swollen and wound is not weeping.   No fever, no chills or sweats.     Review of Systems  Constitutional:  Negative for chills, fatigue and fever.  HENT:  Negative for dental problem.   Respiratory:  Negative for cough, chest tightness, shortness of breath and wheezing.   Cardiovascular:  Negative for chest pain and palpitations.  Gastrointestinal:  Negative for abdominal distention, abdominal pain, blood in stool, diarrhea, rectal pain and vomiting.  Genitourinary:  Negative for dysuria, flank pain and frequency.  Musculoskeletal:  Negative for back pain.  Skin:  Negative for rash.       See hpi.    Past Medical History:  Diagnosis Date   Claudication (HCC) 06/27/2011   Left ABIs abnormal values with mild-moderate arterial insufficiency, right SFA moderate amount of mixed density plaque elevating velocities suggestive 50-69% diameter reduction   Coronary artery disease 06/27/2011   Normal Lexiscan, EF 64, post-stress EF 64, no EKG changes   Diabetes mellitus without complication (HCC)    prediabetic   Hyperlipidemia    Hypertension    Impotence of organic origin    Osteoarthritis    Other testicular hypofunction    PVD (peripheral vascular disease) (HCC)      Social History   Socioeconomic History   Marital status: Married    Spouse name: cathy   Number of children: 1   Years of education: 12   Highest education level: Not on file   Occupational History   Occupation: retired  Tobacco Use   Smoking status: Former    Packs/day: 1.00    Years: 50.00    Total pack years: 50.00    Types: Cigarettes    Quit date: 12/17/2004    Years since quitting: 17.0   Smokeless tobacco: Never  Vaping Use   Vaping Use: Never used  Substance and Sexual Activity   Alcohol use: Yes    Comment: occasionally   Drug use: No   Sexual activity: Not on file  Other Topics Concern   Not on file  Social History Narrative   Lives with wife   Caffeine - coffee, 1 cup daily   Social Determinants of Health   Financial Resource Strain: Not on file  Food Insecurity: Not on file  Transportation Needs: Not on file  Physical Activity: Not on file  Stress: Not on file  Social Connections: Not on file  Intimate Partner Violence: Not on file    Past Surgical History:  Procedure Laterality Date   ABDOMINAL AORTOGRAM N/A 06/28/2020   Procedure: ABDOMINAL AORTOGRAM;  Surgeon: [name], MD;  Location: [facility];  Service: Cardiovascular;  Laterality: N/A;   CARDIAC CATHETERIZATION  12/26/2004   Left main-90% distal tapered stenosis, left circumflex-95% ostial stenosis, first marginal branch 99% ostial branch, posterior descending artery 99% ostial stenosis, 60-70% segmental mid stenosis, needed CABG   CORONARY ARTERY BYPASS  GRAFT  12/27/2004   Left main 3-vessel disease, LIMA to LAD, saphenous vein to circumflexmarginal branch and to the PDA   INGUINAL HERNIA REPAIR Left 2005   LOWER EXTREMITY ANGIOGRAPHY N/A 04/13/2017   Procedure: LOWER EXTREMITY ANGIOGRAPHY;  Surgeon: [name], MD;  Location: [facility];  Service: Cardiovascular;  Laterality: N/A;   LOWER EXTREMITY ANGIOGRAPHY Bilateral 12/06/2020   Procedure: Lower Extremity Angiography;  Surgeon: [name], MD;  Location: [facility];  Service: Cardiovascular;  Laterality: Bilateral;  Limited Study   LOWER EXTREMITY INTERVENTION N/A 08/04/2016    Procedure: Lower Extremity Intervention;  Surgeon: [name], MD;  Location: [facility] IN[facility]ASIVE CV [facility];  Service: Cardiovascular;  Laterality: N/A;   PERIPHERAL VASCULAR ATHERECTOMY Left 04/13/2017   Procedure: PERIPHERAL VASCULAR ATHERECTOMY;  Surgeon: [name], MD;  Location: [facility] IN[facility];  Service: Cardiovascular;  Laterality: Left;  SFA   PERIPHERAL VASCULAR ATHERECTOMY  12/06/2020   Procedure: PERIPHERAL VASCULAR ATHERECTOMY;  Surgeon: [name], MD;  Location: [facility] INVASIVE CV LAB;  Service: Cardiovascular;;  Lt Iliac   PERIPHERAL VASCULAR BALLOON ANGIOPLASTY Left 04/13/2017   Procedure: PERIPHERAL VASCULAR BALLOON ANGIOPLASTY;  Surgeon: [name], MD;  Location: [facility];  Service: Cardiovascular;  Laterality: Left;  SFA   PERIPHERAL VASCULAR INTERVENTION  12/06/2020   Procedure: PERIPHERAL VASCULAR INTERVENTION;  Surgeon: [name], MD;  Location: [facility];  Service: Cardiovascular;;  Lt Iliac   RIGHT/LEFT HEART CATH AND CORONARY/GRAFT ANGIOGRAPHY N/A 06/28/2020   Procedure: RIGHT/LEFT HEART CATH AND CORONARY/GRAFT ANGIOGRAPHY;  Surgeon: [name], MD;  Location: [facility];  Service: Cardiovascular;  Laterality: N/A;    Family History  Problem Relation Age of Onset   Hypertension Mother        8   Clotting disorder Father        54   Diabetes Brother        heart surgery    Allergies  Allergen Reactions   Crestor [Rosuvastatin] Other (See Comments)    Myalgias   Lipitor [Atorvastatin]     myalgias   Lisinopril     cough    Current Outpatient Medications on File Prior to Visit  Medication Sig Dispense Refill   amLODipine (NORVASC) 10 MG tablet TAKE 1 TABLET BY MOUTH EVERY DAY 90 tablet 3   aspirin 81 MG tablet Take 81 mg by mouth daily.     cephALEXin (KEFLEX) 500 MG capsule Take 1 capsule (500 mg total) by mouth 2 (two) times daily. 20 capsule 0   clopidogrel (PLAVIX) 75 MG tablet TAKE 1  TABLET BY MOUTH EVERY DAY 90 tablet 3   Coenzyme Q10 (COQ10) 200 MG CAPS Take 200 mg by mouth daily.     famotidine (PEPCID) 20 MG tablet TAKE 1 TABLET BY MOUTH EVERY DAY 30 tablet 0   fluticasone (FLONASE) 50 MCG/ACT nasal spray Place 2 sprays into both nostrils daily. 16 g 1   isosorbide mononitrate (IMDUR) 60 MG 24 hr tablet TAKE 1 TABLET BY MOUTH EVERY DAY 90 tablet 3   metoprolol tartrate (LOPRESSOR) 50 MG tablet Take 50 mg by mouth daily.     nitroGLYCERIN (NITROSTAT) 0.4 MG SL tablet Place 1 tablet (0.4 mg total) under the tongue every 5 (five) minutes as needed for chest pain. 25 tablet 3   Omega-3 Fatty Acids (FISH OIL) 1200 MG CAPS Take 2 capsules (2,400 mg total) by mouth every morning.  PRALUENT 150 MG/ML SOAJ INJECT 150 MG INTO THE SKIN EVERY 14 (FOURTEEN) DAYS. 2 mL 11   silver sulfADIAZINE (SILVADENE) 1 % cream Apply 1 Application topically 2 (two) times daily. 50 g 0   cilostazol (PLETAL) 50 MG tablet TAKE 1 TABLET BY MOUTH TWICE A DAY 180 tablet 3   clindamycin (CLEOCIN) 150 MG capsule Take 1 capsule (150 mg total) by mouth 3 (three) times daily. 21 capsule 0   levocetirizine (XYZAL) 5 MG tablet TAKE 1 TABLET BY MOUTH EVERY DAY IN THE EVENING (Patient not taking: Reported on 12/24/2021) 30 tablet 0   No current facility-administered medications on file prior to visit.    BP 108/60   Pulse 63   Temp 98 F (36.7 C) (Oral)   Ht 6\' 1"  (1.854 m)   Wt 219 lb 6.4 oz (99.5 kg)   SpO2 93%   BMI 28.95 kg/m        Objective:   Physical Exam  General Mental Status- Alert. General Appearance- Not in acute distress.   Skin Left foot- overall appearance much better. Less swollen, less red than before. Still areas of burn great toes and second toe slow healing.    Chest and Lung Exam Auscultation: Breath Sounds:-Normal.  Cardiovascular Auscultation:Rythm- Regular. Murmurs & Other Heart Sounds:Auscultation of the heart reveals- No Murmurs.        Assessment &  Plan:   Patient Instructions  Left foot skin infection secondary to burn. Improving overall. Continue keflex and offered rocephin 1 gram injection. Agreed to injection.   Continue silvadene.  Rx 3 more days kefex to carry you thru to wound care appt on 16th.  Follow up with 17 as needed before or after wound care if needed.   [place], PA-C

## 2022-01-01 ENCOUNTER — Ambulatory Visit (HOSPITAL_COMMUNITY)
Admission: RE | Admit: 2022-01-01 | Discharge: 2022-01-01 | Disposition: A | Discharge status: Home / Self Care | Payer: Medicare HMO | Source: Ambulatory Visit | Attending: Physician Assistant | Admitting: Physician Assistant

## 2022-01-01 ENCOUNTER — Other Ambulatory Visit (HOSPITAL_COMMUNITY): Payer: Medicare HMO

## 2022-01-01 ENCOUNTER — Encounter (HOSPITAL_BASED_OUTPATIENT_CLINIC_OR_DEPARTMENT_OTHER): Payer: Medicare HMO | Attending: Physician Assistant | Admitting: Physician Assistant

## 2022-01-01 ENCOUNTER — Other Ambulatory Visit (HOSPITAL_COMMUNITY): Payer: Self-pay | Admitting: Physician Assistant

## 2022-01-01 DIAGNOSIS — R7303 Prediabetes: Secondary | ICD-10-CM | POA: Insufficient documentation

## 2022-01-01 DIAGNOSIS — I7389 Other specified peripheral vascular diseases: Secondary | ICD-10-CM | POA: Insufficient documentation

## 2022-01-01 DIAGNOSIS — I10 Essential (primary) hypertension: Secondary | ICD-10-CM | POA: Diagnosis not present

## 2022-01-01 DIAGNOSIS — L97522 Non-pressure chronic ulcer of other part of left foot with fat layer exposed: Secondary | ICD-10-CM | POA: Diagnosis present

## 2022-01-02 ENCOUNTER — Encounter: Payer: Self-pay | Admitting: Family Medicine

## 2022-01-02 ENCOUNTER — Ambulatory Visit (INDEPENDENT_AMBULATORY_CARE_PROVIDER_SITE_OTHER): Payer: Medicare HMO | Admitting: Family Medicine

## 2022-01-02 VITALS — BP 117/51 | HR 63 | Ht 73.0 in | Wt 217.0 lb

## 2022-01-02 DIAGNOSIS — Z131 Encounter for screening for diabetes mellitus: Secondary | ICD-10-CM | POA: Diagnosis not present

## 2022-01-02 DIAGNOSIS — R42 Dizziness and giddiness: Secondary | ICD-10-CM | POA: Diagnosis not present

## 2022-01-02 DIAGNOSIS — R7989 Other specified abnormal findings of blood chemistry: Secondary | ICD-10-CM | POA: Diagnosis not present

## 2022-01-02 LAB — COMPREHENSIVE METABOLIC PANEL
ALT: 16 U/L (ref 0–53)
AST: 19 U/L (ref 0–37)
Albumin: 4 g/dL (ref 3.5–5.2)
Alkaline Phosphatase: 65 U/L (ref 39–117)
BUN: 11 mg/dL (ref 6–23)
CO2: 27 mEq/L (ref 19–32)
Calcium: 9 mg/dL (ref 8.4–10.5)
Chloride: 102 mEq/L (ref 96–112)
Creatinine, Ser: 0.74 mg/dL (ref 0.40–1.50)
GFR: 86.04 mL/min (ref >60.00)
Glucose, Bld: 164 mg/dL — ABNORMAL HIGH (ref 70–99)
Potassium: 4.4 mEq/L (ref 3.5–5.1)
Sodium: 141 mEq/L (ref 135–145)
Total Bilirubin: 1 mg/dL (ref 0.2–1.2)
Total Protein: 6.7 g/dL (ref 6.0–8.3)

## 2022-01-02 LAB — GLUCOSE, POCT (MANUAL RESULT ENTRY): POC Glucose: 154 mg/dl — AB (ref 70–99)

## 2022-01-02 LAB — HEMOGLOBIN A1C: Hgb A1c MFr Bld: 7.3 % — ABNORMAL HIGH (ref 4.6–6.5)

## 2022-01-02 LAB — VITAMIN D 25 HYDROXY (VIT D DEFICIENCY, FRACTURES): VITD: 33.46 ng/mL (ref 30.00–100.00)

## 2022-01-02 NOTE — Progress Notes (Signed)
Established Patient Office Visit  Subjective   Patient ID: [name], male    DOB: 08/11/1940  Age: 81 y.o. MRN: [id number]  CC: screen for diabetes, delayed wound healing   HPI He was able to get in with wound care yesterday. They suggested he get tested for diabetes so they can better direct care.  Long road ahead, they did tell them about potential for amputation if unable to control infection and worries about circulation concerns. They have discussed starting Levaquin, but pharmacist was planning to reach out to PCP first given risk vs benefit, so for now they are continuing Keflex. We did discuss some of the side effects/risks of Levaquin for them to consider. No note from Wound Care available to review in Epic at this time. Per chart review, PCP did order Cipro in the past, earlier this month - educated to family that this med is in the same class.   During discussion with family member regarding all of this, patient got clammy/diaphoretic and dizzy. VSS. He denied any chest pain, dyspnea. No asymmetry or slurred speech. CBG was 150 and EKG stable (NSR). After reclining for a few minutes, drinking some water, and getting some airflow, he felt better.         ROS All review of systems negative except what is listed in the HPI    Objective:     BP (!) 117/51   Pulse 63   Ht 6\' 1"  (1.854 m)   Wt 217 lb (98.4 kg)   BMI 28.63 kg/m    Physical Exam Vitals reviewed.  Constitutional:      General: He is awake.     Appearance: Normal appearance. He is well-developed. He is diaphoretic.  Cardiovascular:     Rate and Rhythm: Normal rate and regular rhythm.  Pulmonary:     Effort: Pulmonary effort is normal. No respiratory distress.     Breath sounds: Normal breath sounds. No wheezing, rhonchi or rales.  Musculoskeletal:     Cervical back: Normal range of motion and neck supple.  Skin:    General: Skin is warm.     Comments: Left foot with dressing and boot from  recent wound care yesterday, did not undress  Neurological:     General: No focal deficit present.     Mental Status: He is alert and oriented to person, place, and time. Mental status is at baseline.  Psychiatric:        Mood and Affect: Mood normal.        Behavior: Behavior normal. Behavior is cooperative.        Thought Content: Thought content normal.        Judgment: Judgment normal.      No results found for any visits on 01/02/22.    The ASCVD Risk score (Arnett DK, et al., 2019) failed to calculate for the following reasons:   The valid total cholesterol range is 130 to 320 mg/dL    Assessment & Plan:   Problem List Items Addressed This Visit   None Visit Diagnoses     Screening for diabetes mellitus (DM)    -  Primary Labs today. Will update with results.    Relevant Orders   HgB A1c   Comprehensive metabolic panel   Dizziness     See note above. EKG NSR. CBG 150. Brief episode, resolved with hydration, reclining, airflow. Feeling back to baseline. Recommend hydration and oral intake after appointment (fasting and has only had one sip  of water with meds). Strict ED precautions discussed.    Relevant Orders   EKG 12-Lead (Completed)   Low vitamin D level     He has been supplementing as previously recommended. Rechecking level today.   Relevant Orders   Vitamin D (25 hydroxy)       Return if symptoms worsen or fail to improve.    Clayborne Dana, NP

## 2022-01-02 NOTE — Patient Instructions (Signed)
Fingerstick glucose stable (150) EKG normal Consider stress response, overheating, vagal response, etc. Make sure you eat well and stay hydrated today. Take it easy. For any recurring episodes, go to the ED for further evaluation.  Labs today to screen for diabetes.

## 2022-01-03 ENCOUNTER — Telehealth: Payer: Self-pay | Admitting: Medical

## 2022-01-03 ENCOUNTER — Encounter: Payer: Self-pay | Admitting: Medical

## 2022-01-03 MED ORDER — METFORMIN HCL 500 MG PO TABS: 500.0000 mg | ORAL_TABLET | Freq: Two times a day (BID) | ORAL | 3 refills | Status: DC

## 2022-01-03 NOTE — Addendum Note (Signed)
Addended by: Gwenevere Abbot on: 01/03/2022 05:38 PM   Modules accepted: Orders

## 2022-01-03 NOTE — Telephone Encounter (Signed)
See my chart message I sent pt. If you would get him in for follow some time such as Tuesday or wednesday next week with someone to check his foot. Thanks.

## 2022-01-05 ENCOUNTER — Other Ambulatory Visit: Payer: Self-pay | Admitting: Medical

## 2022-01-06 ENCOUNTER — Ambulatory Visit (HOSPITAL_BASED_OUTPATIENT_CLINIC_OR_DEPARTMENT_OTHER)
Admission: RE | Admit: 2022-01-06 | Discharge: 2022-01-06 | Disposition: A | Discharge status: Home / Self Care | Payer: Medicare HMO | Source: Ambulatory Visit | Attending: Cardiovascular Disease | Admitting: Cardiovascular Disease

## 2022-01-06 ENCOUNTER — Ambulatory Visit (HOSPITAL_COMMUNITY)
Admission: RE | Admit: 2022-01-06 | Discharge: 2022-01-06 | Disposition: A | Discharge status: Home / Self Care | Payer: Medicare HMO | Source: Ambulatory Visit | Attending: Cardiology | Admitting: Cardiology

## 2022-01-06 DIAGNOSIS — Z95828 Presence of other vascular implants and grafts: Secondary | ICD-10-CM | POA: Insufficient documentation

## 2022-01-06 NOTE — Telephone Encounter (Signed)
Pt is scheduled to see wound care on 01/08/2022 , did not offer appointment in office due to seeing speciality

## 2022-01-07 ENCOUNTER — Encounter: Payer: Medicare HMO | Admitting: Vascular Surgery

## 2022-01-07 NOTE — Progress Notes (Signed)
[name], [name] ([id number]) Visit Report for 01/01/2022 Allergy List Details Patient Name: Date of Service: [name] 01/01/2022 8:00 Richard M Medical Record Number: [id number] Patient Account Number: [id number] Date of Birth/Sex: Treating RN: 03-05-1941 (81 y.o. [name] Primary Care Jyles Sontag: [name] Other Clinician: Referring Kasyn Stouffer: Treating Andreu Drudge/Extender: [name] in Treatment: 0 Allergies Active Allergies Crestor Lipitor lisinopril Allergy Notes Electronic Signature(s) Signed: 01/07/2022 3:41:21 PM By: [name] RN Entered By: [name] on 12/31/2021 16:38:52 -------------------------------------------------------------------------------- Arrival Information Details Patient Name: Date of Service: [name], [name] 01/01/2022 8:00 Richard M Medical Record Number: [id number] Patient Account Number: [id number] Date of Birth/Sex: Treating RN: 01-06-1941 (81 y.o. [name], [name] Primary Care Sidney Kann: [name] Other Clinician: Referring Calea Hribar: Treating Furkan Keenum/Extender: Ernestina Penna, Jessie Foot in Treatment: 0 Visit Information Patient Arrived: Ambulatory Arrival Time: 08:51 Accompanied By: self Transfer Assistance: None Patient Identification Verified: Yes Secondary Verification Process Completed: Yes Patient Requires Transmission-Based Precautions: No Patient Has Alerts: Yes Patient Alerts: ABI's:02/23 R: N/C L:0.6 TBI's:02/23 R:0.62L:0.26 Electronic Signature(s) Signed: 01/07/2022 3:41:21 PM By: [name] RN Entered By: [name] on 01/01/2022 08:54:41 -------------------------------------------------------------------------------- Clinic Level of Care Assessment Details Patient Name: Date of Service: [name] 01/01/2022 8:00 Richard M Medical Record Number: [id number] Patient Account Number: [id number] Date of Birth/Sex: Treating RN: 04/03/1941 (81 y.o. [name], [name] Primary Care Melodee Lupe: [name] Other Clinician: Referring Anaaya Fuster: Treating Caleigha Zale/Extender: [name] in Treatment: 0 Clinic Level of Care Assessment Items TOOL 4 Quantity Score X- 1 0 Use when only an EandM is performed on FOLLOW-UP visit ASSESSMENTS - Nursing Assessment / Reassessment X- 1 10 Reassessment of Co-morbidities (includes updates in patient status) X- 1 5 Reassessment of Adherence to Treatment Plan ASSESSMENTS - Wound and Skin Richard ssessment / Reassessment []  - 0 Simple Wound Assessment / Reassessment - one wound X- 2 5 Complex Wound Assessment / Reassessment - multiple wounds []  - 0 Dermatologic / Skin Assessment (not related to wound area) ASSESSMENTS - Focused Assessment X- 1 5 Circumferential Edema Measurements - multi extremities []  - 0 Nutritional Assessment / Counseling / Intervention []  - 0 Lower Extremity Assessment (monofilament, tuning fork, pulses) []  - 0 Peripheral Arterial Disease Assessment (using hand held doppler) ASSESSMENTS - Ostomy and/or Continence Assessment and Care []  - 0 Incontinence Assessment and Management []  - 0 Ostomy Care Assessment and Management (repouching, etc.) PROCESS - Coordination of Care []  - 0 Simple Patient / Family Education for ongoing care X- 1 20 Complex (extensive) Patient / Family Education for ongoing care X- 1 10 Staff obtains , Records, T Results / Process Orders est []  - 0 Staff telephones HHA, Nursing Homes / Clarify orders / etc []  - 0 Routine Transfer to another Facility (non-emergent condition) []  - 0 Routine Hospital Admission (non-emergent condition) X- 1 15 New Admissions / / Ordering NPWT Apligraf, etc. , []  - 0 Emergency Hospital Admission (emergent condition) []  - 0 Simple Discharge Coordination X- 1 15 Complex (extensive) Discharge Coordination PROCESS - Special Needs []  - 0 Pediatric / Minor  Patient Management []  - 0 Isolation Patient Management []  - 0 Hearing / Language / Visual special needs []  - 0 Assessment of Community assistance (transportation, D/C planning, etc.) []  - 0 Additional assistance / Altered mentation []  - 0 Support Surface(s) Assessment (bed, cushion, seat, etc.) INTERVENTIONS - Wound Cleansing / Measurement []  - 0  Simple Wound Cleansing - one wound X- 2 5 Complex Wound Cleansing - multiple wounds X- 1 5 Wound Imaging (photographs - any number of wounds) []  - 0 Wound Tracing (instead of photographs) []  - 0 Simple Wound Measurement - one wound X- 2 5 Complex Wound Measurement - multiple wounds INTERVENTIONS - Wound Dressings []  - 0 Small Wound Dressing one or multiple wounds X- 2 15 Medium Wound Dressing one or multiple wounds []  - 0 Large Wound Dressing one or multiple wounds X- 1 5 Application of Medications - topical []  - 0 Application of Medications - injection INTERVENTIONS - Miscellaneous []  - 0 External ear exam []  - 0 Specimen Collection (cultures, biopsies, blood, body fluids, etc.) []  - 0 Specimen(s) / Culture(s) sent or taken to Lab for analysis []  - 0 Patient Transfer (multiple staff / / Similar devices) []  - 0 Simple Staple / Suture removal (25 or less) []  - 0 Complex Staple / Suture removal (26 or more) []  - 0 Hypo / Hyperglycemic Management (close monitor of Blood Glucose) []  - 0 Ankle / Brachial Index (ABI) - do not check if billed separately X- 1 5 Vital Signs Has the patient been seen at the hospital within the last three years: Yes Total Score: 155 Level Of Care: New/Established - Level 4 Electronic Signature(s) Signed: 01/07/2022 3:41:21 PM By: RN Entered By: on 01/01/2022 09:40:09 -------------------------------------------------------------------------------- Lower Extremity Assessment Details Patient Name: Date of Service: , [name] 01/01/2022 8:00 Richard  M Medical Record Number: Patient Account Number: Date of Birth/Sex: Treating RN: 03-13-1941 (81 y.o. Primary Care Lucillie Kiesel: Other Clinician: Referring Aerionna Moravek: Treating Loanne Emery/Extender: in Treatment: 0 Edema Assessment Assessed: 01/09/2022: Yes] [name]: No] Edema: [Left: Ye] [Right: s] Calf Left: Right: Point of Measurement: 37 cm From Medial Instep 37.5 cm Ankle Left: Right: Point of Measurement: 10 cm From Medial Instep 24.5 cm Knee To Floor Left: Right: From Medial Instep 46 cm Vascular Assessment Pulses: Dorsalis Pedis Palpable: [Left:Yes] Posterior Tibial Palpable: [Left:Yes] Electronic Signature(s) Signed: 01/07/2022 3:41:21 PM By: 01/03/2022 RN Entered By: [name] on 01/01/2022 08:50:48 -------------------------------------------------------------------------------- Multi-Disciplinary Care Plan Details Patient Name: Date of Service: [id number], [name] 01/01/2022 8:00 Richard M Medical Record Number: 04/11/1941 Patient Account Number: [id number] Date of Birth/Sex: Treating RN: April 26, 1941 (81 y.o. [name], [name] Primary Care Tymia Streb: Kyra Searles Other Clinician: Referring Vanisha Whiten: Treating Sharnell Knight/Extender: Franne Forts in Treatment: 0 Active Inactive Orientation to the Wound Care Program Nursing Diagnoses: Knowledge deficit related to the wound healing center program Goals: Patient/caregiver will verbalize understanding of the Wound Healing Center Program Date Initiated: 01/01/2022 Target Resolution Date: 01/24/2022 Goal Status: Active Interventions: Provide education on orientation to the wound center Notes: Tissue Oxygenation Nursing Diagnoses: Actual ineffective tissue perfusion; peripheral (select once diagnosis is confirmed) Knowledge deficit related to disease process and management Potential alteration in  peripheral tissue perfusion (select prior to confirmation of diagnosis) Goals: Invasive arterial studies completed as ordered Date Initiated: 01/01/2022 Target Resolution Date: 01/25/2022 Goal Status: Active Non-invasive arterial studies are completed as ordered Date Initiated: 01/01/2022 Target Resolution Date: 01/24/2022 Goal Status: Active Patient/caregiver will verbalize understanding of disease process and disease management Date Initiated: 01/01/2022 Target Resolution Date: 01/25/2022 Goal Status: Active Interventions: Assess patient understanding of disease process and management upon diagnosis and as needed Assess peripheral arterial status upon admission and as needed  Provide education on tissue oxygenation and ischemia Notes: Wound/Skin Impairment Nursing Diagnoses: Impaired tissue integrity Knowledge deficit related to ulceration/compromised skin integrity Goals: Patient will have Richard decrease in wound volume by X% from date: (specify in notes) Date Initiated: 01/01/2022 Target Resolution Date: 01/23/2022 Goal Status: Active Patient/caregiver will verbalize understanding of skin care regimen Date Initiated: 01/01/2022 Target Resolution Date: 01/24/2022 Goal Status: Active Ulcer/skin breakdown will have Richard volume reduction of 30% by week 4 Date Initiated: 01/01/2022 Target Resolution Date: 01/25/2022 Goal Status: Active Interventions: Assess patient/caregiver ability to obtain necessary supplies Assess patient/caregiver ability to perform ulcer/skin care regimen upon admission and as needed Assess ulceration(s) every visit Notes: Electronic Signature(s) Signed: 01/07/2022 3:41:21 PM By: [name] RN Entered By: [name] on 01/01/2022 08:59:23 -------------------------------------------------------------------------------- Pain Assessment Details Patient Name: Date of Service: [name], [name] 01/01/2022 8:00 Richard M Medical Record Number: [id number] Patient Account  Number: [id number] Date of Birth/Sex: Treating RN: 09/02/1940 (81 y.o. [name] Primary Care Tiandra Swoveland: [name] Other Clinician: Referring Shanasia Ibrahim: Treating Avonna Iribe/Extender: [name] in Treatment: 0 Active Problems Location of Pain Severity and Description of Pain Patient Has Paino No Site Locations Pain Management and Medication Current Pain Management: Electronic Signature(s) Signed: 01/07/2022 3:41:21 PM By: [name] RN Entered By: [name] on 01/01/2022 08:50:58 -------------------------------------------------------------------------------- Patient/Caregiver Education Details Patient Name: Date of Service: [name], [name] 8/16/2023andnbsp8:00 Richard M Medical Record Number: [id number] Patient Account Number: [id number] Date of Birth/Gender: Treating RN: 1941/02/14 (81 y.o. [name] Primary Care Physician: [name] Other Clinician: Referring Physician: Treating Physician/Extender: [name] in Treatment: 0 Education Assessment Education Provided To: Patient Education Topics Provided Tissue Oxygenation: Methods: Explain/Verbal Responses: Reinforcements needed, State content correctly Welcome T [facility]: o Methods: Explain/Verbal Responses: Reinforcements needed, State content correctly Electronic Signature(s) Signed: 01/07/2022 3:41:21 PM By: [name] RN Entered By: [name] on 01/01/2022 08:59:34 -------------------------------------------------------------------------------- Wound Assessment Details Patient Name: Date of Service: [name], [name] 01/01/2022 8:00 Richard M Medical Record Number: [id number] Patient Account Number: [id number] Date of Birth/Sex: Treating RN: [birth year] (81 y.o. [name], [name] Primary Care Damonie Furney: [name] Other Clinician: Referring Shone Leventhal: Treating Kailen Hinkle/Extender: [name] in Treatment: 0 Wound Status Wound Number: 1 Primary Arterial Insufficiency Ulcer Etiology: Wound Location: Left T Great oe Wound Open Wounding Event: Trauma Status: Date Acquired: 12/01/2021 Comorbid Coronary Artery Disease, Hypotension, Peripheral Arterial Weeks Of Treatment: 0 History: Disease, Peripheral Venous Disease, Type II Diabetes, Clustered Wound: No Osteoarthritis Photos Wound Measurements Length: (cm) 5 Width: (cm) 2.5 Depth: (cm) 0.2 Area: (cm) 9.817 Volume: (cm) 1.963 % Reduction in Area: 0% % Reduction in Volume: 0% Epithelialization: Small (1-33%) Tunneling: No Undermining: No Wound Description Classification: Full Thickness With Exposed Support Structures Wound Margin: Distinct, outline attached Exudate Amount: Medium Exudate Type: Serosanguineous Exudate Color: red, brown Foul Odor After Cleansing: No Slough/Fibrino Yes Wound Bed Granulation Amount: Medium (34-66%) Exposed Structure Granulation Quality: Red, Pink Fascia Exposed: No Necrotic Amount: Medium (34-66%) Fat Layer (Subcutaneous Tissue) Exposed: Yes Necrotic Quality: Adherent Slough Tendon Exposed: No Muscle Exposed: No Joint Exposed: No Bone Exposed: No Electronic Signature(s) Signed: 01/01/2022 4:57:18 PM By: Thayer Dallas Signed: 01/07/2022 3:41:21 PM By: [name] RN Entered By: Thayer Dallas on 01/01/2022 08:45:49 -------------------------------------------------------------------------------- Wound Assessment Details Patient Name: Date of Service: [name], [name] 01/01/2022 8:00 Richard M Medical Record Number: [id number] Patient  Account Number: [id number] Date of Birth/Sex: Treating RN: [birth year] (81 y.o. [name], [name] Primary Care Deamber Buckhalter: [name] Other Clinician: Referring Aster Screws: Treating Deuce Paternoster/Extender: [name] in Treatment: 0 Wound Status Wound Number: 2 Primary Arterial  Insufficiency Ulcer Etiology: Wound Location: Left T Second oe Wound Open Wounding Event: Trauma Status: Date Acquired: 12/01/2021 Comorbid Coronary Artery Disease, Hypotension, Peripheral Arterial Weeks Of Treatment: 0 History: Disease, Peripheral Venous Disease, Type II Diabetes, Clustered Wound: No Osteoarthritis Photos Wound Measurements Length: (cm) 0.5 Width: (cm) 1 Depth: (cm) 0.1 Area: (cm) 0.393 Volume: (cm) 0.039 % Reduction in Area: 0% % Reduction in Volume: 0% Epithelialization: Small (1-33%) Tunneling: No Undermining: No Wound Description Classification: Full Thickness With Exposed Support Structures Wound Margin: Distinct, outline attached Exudate Amount: Medium Exudate Type: Serosanguineous Exudate Color: red, brown Foul Odor After Cleansing: No Slough/Fibrino Yes Wound Bed Granulation Amount: Medium (34-66%) Exposed Structure Granulation Quality: Red, Pink Fascia Exposed: No Necrotic Amount: Medium (34-66%) Fat Layer (Subcutaneous Tissue) Exposed: Yes Necrotic Quality: Adherent Slough Tendon Exposed: No Muscle Exposed: No Joint Exposed: No Bone Exposed: No Electronic Signature(s) Signed: 01/01/2022 4:57:18 PM By: Thayer Dallas Signed: 01/07/2022 3:41:21 PM By: [name] RN Entered By: Thayer Dallas on 01/01/2022 08:46:30

## 2022-01-07 NOTE — Progress Notes (Signed)
EVERETT, EHRLER ([id number]) Visit Report for 01/01/2022 Abuse Risk Screen Details Patient Name: Date of Service: Evelina Bucy LFO RD 01/01/2022 8:00 A M Medical Record Number: [id number] Patient Account Number: [id number] Date of Birth/Sex: Treating RN: 05/22/1940 (81 y.o. [name], [name] Primary Care Arisbeth Purrington: [name] Other Clinician: Referring Madsen Riddle: Treating Gary Bultman/Extender: [name] in Treatment: 0 Abuse Risk Screen Items Answer ABUSE RISK SCREEN: Has anyone close to you tried to hurt or harm you recentlyo No Do you feel uncomfortable with anyone in your familyo No Has anyone forced you do things that you didnt want to doo No Electronic Signature(s) Signed: 01/07/2022 3:41:21 PM By: [name] RN Entered By: [name] on 01/01/2022 08:49:41 -------------------------------------------------------------------------------- Activities of Daily Living Details Patient Name: Date of Service: Evelina Bucy LFO RD 01/01/2022 8:00 A M Medical Record Number: [id number] Patient Account Number: [id number] Date of Birth/Sex: Treating RN: [birth year] (81 y.o. [name], [name] Primary Care Leyli Kevorkian: [name] Other Clinician: Referring Kyli Sorter: Treating Lavelle Berland/Extender: [name] in Treatment: 0 Activities of Daily Living Items Answer Activities of Daily Living (Please select one for each item) Drive Automobile Completely Able T Medications ake Completely Able Use T elephone Completely Able Care for Appearance Completely Able Use T oilet Completely Able Bath / Shower Completely Able Dress Self Completely Able Feed Self Completely Able Walk Completely Able Get In / Out Bed Completely Able Housework Completely Able Prepare Meals Completely Able Handle Money Completely Able Shop for Self Completely Able Electronic Signature(s) Signed: 01/07/2022 3:41:21 PM By: [name] RN Entered By:  [name] on 01/01/2022 08:49:58 -------------------------------------------------------------------------------- Education Screening Details Patient Name: Date of Service: Violet Baldy, A LFO RD 01/01/2022 8:00 A M Medical Record Number: [id number] Patient Account Number: [id number] Date of Birth/Sex: Treating RN: August 29, 1940 (81 y.o. [name], [name] Primary Care Archer Moist: [name] Other Clinician: Referring Ch[place]uma Straus: Treating Darean Rote/Extender: [name] in Treatment: 0 Primary Learner Assessed: Patient Learning Preferences/Education Level/Primary Language Learning Preference: Explanation, Demonstration, Communication Board, Printed Material Highest Education Level: College or Above Preferred Language: English Cognitive Barrier Language Barrier: No Translator Needed: No Memory Deficit: No Emotional Barrier: No Cultural/Religious Beliefs Affecting Medical Care: No Physical Barrier Impaired Vision: No Impaired Hearing: No Decreased Hand dexterity: No Knowledge/Comprehension Knowledge Level: High Comprehension Level: High Ability to understand written instructions: High Ability to understand verbal instructions: High Motivation Anxiety Level: Calm Cooperation: Cooperative Education Importance: Denies Need Interest in Health Problems: Asks Questions Perception: Coherent Willingness to Engage in Self-Management High Activities: Readiness to Engage in Self-Management High Activities: Electronic Signature(s) Signed: 01/07/2022 3:41:21 PM By: [name] RN Entered By: [name] on 01/01/2022 08:50:19 -------------------------------------------------------------------------------- Fall Risk Assessment Details Patient Name: Date of Service: Cleora Fleet D, A LFO RD 01/01/2022 8:00 A M Medical Record Number: [id number] Patient Account Number: [id number] Date of Birth/Sex: Treating RN: January 16, 1941 (80 y.o. [name],  [name] Primary Care Jaxxon Naeem: [name] Other Clinician: Referring [place] Pettengill: Treating Jnae Thomaston/Extender: [name] in Treatment: 0 Fall Risk Assessment Items Have you had 2 or more falls in the last 12 monthso 0 No Have you had any fall that resulted in injury in the last 12 monthso 0 No FALLS RISK SCREEN History of falling - immediate or within 3 months 0 No Secondary diagnosis (Do you have 2 or more medical diagnoseso) 0 No Ambulatory aid None/bed rest/wheelchair/nurse 0 No Crutches/cane/walker 0 No Furniture 0 No Intravenous therapy  Access/Saline/Heparin Lock 0 No Gait/Transferring Normal/ bed rest/ wheelchair 0 No Weak (short steps with or without shuffle, stooped but able to lift head while walking, may seek 0 No support from furniture) Impaired (short steps with shuffle, may have difficulty arising from chair, head down, impaired 0 No balance) Mental Status Oriented to own ability 0 No Electronic Signature(s) Signed: 01/07/2022 3:41:21 PM By: [name] RN Entered By: [name] on 01/01/2022 08:50:24 -------------------------------------------------------------------------------- Foot Assessment Details Patient Name: Date of Service: Violet Baldy, A LFO RD 01/01/2022 8:00 A M Medical Record Number: [id number] Patient Account Number: [id number] Date of Birth/Sex: Treating RN: 03-09-1941 (81 y.o. [name] Primary Care Sadhana Frater: [name] Other Clinician: Referring Esra Frankowski: Treating Kyilee Gregg/Extender: [name] in Treatment: 0 Foot Assessment Items Site Locations + = Sensation present, - = Sensation absent, C = Callus, U = Ulcer R = Redness, W = Warmth, M = Maceration, PU = Pre-ulcerative lesion F = Fissure, S = Swelling, D = Dryness Assessment Right: Left: Other Deformity: No No Prior Foot Ulcer: No No Prior Amputation: No No Charcot Joint: No No Ambulatory Status:  Ambulatory Without Help Gait: Steady Electronic Signature(s) Signed: 01/07/2022 3:41:21 PM By: [name] RN Entered By: [name] on 01/01/2022 08:50:44 -------------------------------------------------------------------------------- Nutrition Risk Screening Details Patient Name: Date of Service: Violet Baldy, A LFO RD 01/01/2022 8:00 A M Medical Record Number: [id number] Patient Account Number: [id number] Date of Birth/Sex: Treating RN: 1941/01/11 (81 y.o. [name] Primary Care Narayan Scull: [name] Other Clinician: Referring Aniylah Avans: Treating Dietrich Samuelson/Extender: [name] in Treatment: 0 Height (in): Weight (lbs): Body Mass Index (BMI): Nutrition Risk Screening Items Score Screening NUTRITION RISK SCREEN: I have an illness or condition that made me change the kind and/or amount of food I eat 0 No I eat fewer than two meals per day 0 No I eat few fruits and vegetables, or milk products 0 No I have three or more drinks of beer, liquor or wine almost every day 0 No I have tooth or mouth problems that make it hard for me to eat 0 No I don't always have enough money to buy the food I need 0 No I eat alone most of the time 0 No I take three or more different prescribed or over-the-counter drugs a day 0 No Without wanting to, I have lost or gained 10 pounds in the last six months 0 No I am not always physically able to shop, cook and/or feed myself 0 No Nutrition Protocols Good Risk Protocol 0 No interventions needed Moderate Risk Protocol High Risk Proctocol Risk Level: Good Risk Score: 0 Electronic Signature(s) Signed: 01/07/2022 3:41:21 PM By: [name] RN Entered By: [name] on 01/01/2022 08:50:30

## 2022-01-07 NOTE — Progress Notes (Signed)
GRIFF, BADLEY ([id number]) Visit Report for 01/01/2022 Chief Complaint Document Details Patient Name: Date of Service: Evelina Bucy LFO RD 01/01/2022 8:00 A M Medical Record Number: [id number] Patient Account Number: [id number] Date of Birth/Sex: Treating RN: February 17, 1941 (81 y.o. [name] Primary Care Provider: [name] Other Clinician: Referring Provider: Treating Provider/Extender: [name] in Treatment: 0 Information Obtained from: Patient Chief Complaint Left 1st and 2nd toe ulcers Electronic Signature(s) Signed: 01/01/2022 9:13:55 AM By: Lenda Kelp PA-C Entered By: Lenda Kelp on 01/01/2022 09:13:55 -------------------------------------------------------------------------------- HPI Details Patient Name: Date of Service: Violet Baldy, A LFO RD 01/01/2022 8:00 A M Medical Record Number: [id number] Patient Account Number: [id number] Date of Birth/Sex: Treating RN: July 23, 1940 (81 y.o. [name], [name] Primary Care Provider: [name] Other Clinician: Referring Provider: Treating Provider/Extender: [name] in Treatment: 0 History of Present Illness HPI Description: 01-01-2022 upon evaluation today patient appears to be doing poorly in regard to her wound that is on the left great toe and second toe location. The toenail of the second toe covers over a portion of the wound which I am concerned may be hiding a piece of exposed bone based on what I see today. The patient does have a history of peripheral vascular disease he is a patient of Dr. Gery Pray who did an abdominal aortogram previous and was unable to intervene with regard to the lower extremity blood flow. The patient had a right ABI which was noncompressible and a left ABI was 0.6 with TBI's of 0.26 unfortunately. The TBI on the right was 0.62 which was not nearly as bad. Nonetheless the unfortunate thing is that with poor blood flow and potential  for bone exposure this becomes increasingly complicated if the patient does have osteomyelitis to get this to heal. He does have repeat vascular testing on Monday upcoming. The patient is stated to be "prediabetic" according to notes and the patient. Currently he has been on Keflex. Patient has a history of peripheral vascular disease, prediabetes, hypertension, and unfortunately the current wound which has been present for 4 weeks based on what they tell me today. Electronic Signature(s) Signed: 01/01/2022 3:40:13 PM By: Lenda Kelp PA-C Entered By: Lenda Kelp on 01/01/2022 15:40:13 -------------------------------------------------------------------------------- Physical Exam Details Patient Name: Date of Service: Evelina Bucy LFO RD 01/01/2022 8:00 A M Medical Record Number: [id number] Patient Account Number: [id number] Date of Birth/Sex: Treating RN: 05/13/1941 (81 y.o. [name] Primary Care Provider: [name] Other Clinician: Referring Provider: Treating Provider/Extender: [name] in Treatment: 0 Constitutional Well-nourished and well-hydrated in no acute distress. Eyes conjunctiva clear no eyelid edema noted. pupils equal round and reactive to light and accommodation. Ears, Nose, Mouth, and Throat no gross abnormality of ear auricles or external auditory canals. normal hearing noted during conversation. mucus membranes moist. Respiratory normal breathing without difficulty. Cardiovascular Absent posterior tibial and dorsalis pedis pulses bilateral lower extremities. trace pitting edema of the bilateral lower extremities. Musculoskeletal normal gait and posture. no significant deformity or arthritic changes, no loss or range of motion, no clubbing. Psychiatric this patient is able to make decisions and demonstrates good insight into disease process. Alert and Oriented x 3. pleasant and cooperative. Notes Upon inspection  patient's wound bed actually showed signs of fairly good granulation and epithelization at this point. This is in regard to the great toe. The second toe unfortunately is a little bit more concerning  to be honest. Here we do see evidence of the toenail growing over top of a portion of the wound I carefully trimmed the toenail back using scissors and forceps. This was very soft and somewhat loose anyway. I was able to do this without any difficulty and once I removed the toenail unfortunately the patient had bone exposed right up underneath this region. Obviously no debridement nor sample was taken especially in light of the TBI's of 0.2 which unfortunately indicate he does not have good blood flow and this is can be very difficult to heal. I am going to send him for evaluation with regard to x-ray to see if there is indeed any evidence of osteomyelitis present on x-ray we may be proceeding to MRI. Electronic Signature(s) Signed: 01/01/2022 3:41:39 PM By: Lenda Kelp PA-C Entered By: Lenda Kelp on 01/01/2022 15:41:39 -------------------------------------------------------------------------------- Physician Orders Details Patient Name: Date of Service: Violet Baldy, A LFO RD 01/01/2022 8:00 A M Medical Record Number: [id number] Patient Account Number: [id number] Date of Birth/Sex: Treating RN: 1940-07-26 (81 y.o. [name] Primary Care Provider: [name] Other Clinician: Referring Provider: Treating Provider/Extender: [name] in Treatment: 0 Verbal / Phone Orders: No Diagnosis Coding Follow-up Appointments ppointment in 1 week. - 01/08/22 @ 0800 w/ [address] and [place], [place] # 9 Return A Bathing/ Shower/ Hygiene May shower with protection but do not get wound dressing(s) wet. Edema Control - Lymphedema / SCD / Other Elevate legs to the level of the heart or above for 30 minutes daily and/or when sitting, a frequency of: Avoid standing for  long periods of time. Off-Loading Other: - Keep pressure off of Left Foot/toes Wound Treatment Wound #1 - T Great oe Wound Laterality: Left Cleanser: Soap and Water 1 x Per Day/15 Days Discharge Instructions: May shower and wash wound with dial antibacterial soap and water prior to dressing change. Cleanser: Wound Cleanser (DME) (Generic) 1 x Per Day/15 Days Discharge Instructions: Cleanse the wound with wound cleanser prior to applying a clean dressing using gauze sponges, not tissue or cotton balls. Prim Dressing: Xeroform Occlusive Gauze Dressing, 4x4 in (DME) (Generic) 1 x Per Day/15 Days ary Discharge Instructions: Apply to wound bed as instructed Secondary Dressing: Woven Gauze Sponge, Non-Sterile 4x4 in (DME) (Generic) 1 x Per Day/15 Days Discharge Instructions: Apply over primary dressing as directed. Secured With: Insurance underwriter, Sterile 2x75 (in/in) (DME) (Generic) 1 x Per Day/15 Days Discharge Instructions: Secure with stretch gauze as directed. Secured With: 75M Medipore H Soft Cloth Surgical T ape, 4 x 10 (in/yd) (DME) (Generic) 1 x Per Day/15 Days Discharge Instructions: Secure with tape as directed. Secured With: [employer] Size 2, 10 (yds) (DME) (Generic) 1 x Per Day/15 Days Wound #2 - T Second oe Wound Laterality: Left Cleanser: Soap and Water 1 x Per Day/15 Days Discharge Instructions: May shower and wash wound with dial antibacterial soap and water prior to dressing change. Cleanser: Wound Cleanser (DME) (Generic) 1 x Per Day/15 Days Discharge Instructions: Cleanse the wound with wound cleanser prior to applying a clean dressing using gauze sponges, not tissue or cotton balls. Prim Dressing: Xeroform Occlusive Gauze Dressing, 4x4 in (DME) (Generic) 1 x Per Day/15 Days ary Discharge Instructions: Apply to wound bed as instructed Secondary Dressing: Woven Gauze Sponge, Non-Sterile 4x4 in (DME) (Generic) 1 x Per Day/15 Days Discharge Instructions: Apply  over primary dressing as directed. Secured With: Insurance underwriter, Sterile 2x75 (in/in) (DME) (  Generic) 1 x Per Day/15 Days Discharge Instructions: Secure with stretch gauze as directed. Secured With: 34M Medipore H Soft Cloth Surgical T ape, 4 x 10 (in/yd) (DME) (Generic) 1 x Per Day/15 Days Discharge Instructions: Secure with tape as directed. Secured With: Stretch Net Size 2, 10 (yds) (DME) (Generic) 1 x Per Day/15 Days Radiology X-ray, foot - Left foot r/t Left great toe ulcer and left 2nd toe ulcer Patient Medications llergies: Crestor, Lipitor, lisinopril A Notifications Medication Indication Start End 01/01/2022 levofloxacin DOSE 1 - oral 750 mg tablet - 1 tablet oral taken 1 time per day for 30 days Electronic Signature(s) Signed: 01/01/2022 9:59:24 AM By: Lenda Kelp PA-C Entered By: Lenda Kelp on 01/01/2022 09:59:24 Prescription 01/01/2022 -------------------------------------------------------------------------------- Levonne Hubert PA Patient Name: Provider: 03-18-1941 [id number] Date of Birth: NPI#Judie Petit [id number] Sex: DEA #: [phone] Phone #: License #: Edyth Gunnels [facility] Patient Address: [address] [place] [address] [place], [place] [address] [address] [place], [place] [address] [phone] Allergies Crestor; Lipitor; lisinopril Provider's Orders X-ray, foot - Left foot r/t Left great toe ulcer and left 2nd toe ulcer Hand Signature: Date(s): Electronic Signature(s) Signed: 01/01/2022 4:35:53 PM By: Lenda Kelp PA-C Entered By: Lenda Kelp on 01/01/2022 09:59:24 -------------------------------------------------------------------------------- Problem List Details Patient Name: Date of Service: Violet Baldy, A LFO RD 01/01/2022 8:00 A M Medical Record Number: [id number] Patient Account Number: [id number] Date of Birth/Sex: Treating RN: [birth year] (81 y.o. [name],  [name] Primary Care Provider: [name] Other Clinician: Referring Provider: Treating Provider/Extender: [name] in Treatment: 0 Active Problems ICD-10 Encounter Code Description Active Date MDM Diagnosis I73.89 Other specified peripheral vascular diseases 01/01/2022 No Yes L97.522 Non-pressure chronic ulcer of other part of left foot with fat layer exposed 01/01/2022 No Yes R73.03 Prediabetes 01/01/2022 No Yes I10 Essential (primary) hypertension 01/01/2022 No Yes Inactive Problems Resolved Problems Electronic Signature(s) Signed: 01/01/2022 9:13:32 AM By: Lenda Kelp PA-C Entered By: Lenda Kelp on 01/01/2022 09:13:32 -------------------------------------------------------------------------------- Progress Note Details Patient Name: Date of Service: Violet Baldy, A LFO RD 01/01/2022 8:00 A M Medical Record Number: [id number] Patient Account Number: [id number] Date of Birth/Sex: Treating RN: [birth year] (81 y.o. [name] Primary Care Provider: [name] Other Clinician: Referring Provider: Treating Provider/Extender: [name] in Treatment: 0 Subjective Chief Complaint Information obtained from Patient Left 1st and 2nd toe ulcers History of Present Illness (HPI) 01-01-2022 upon evaluation today patient appears to be doing poorly in regard to her wound that is on the left great toe and second toe location. The toenail of the second toe covers over a portion of the wound which I am concerned may be hiding a piece of exposed bone based on what I see today. The patient does have a history of peripheral vascular disease he is a patient of Dr. Gery Pray who did an abdominal aortogram previous and was unable to intervene with regard to the lower extremity blood flow. The patient had a right ABI which was noncompressible and a left ABI was 0.6 with TBI's of 0.26 unfortunately. The TBI on the right was 0.62 which  was not nearly as bad. Nonetheless the unfortunate thing is that with poor blood flow and potential for bone exposure this becomes increasingly complicated if the patient does have osteomyelitis to get this to heal. He does have repeat vascular testing on Monday upcoming. The patient is  stated to be "prediabetic" according to notes and the patient. Currently he has been on Keflex. Patient has a history of peripheral vascular disease, prediabetes, hypertension, and unfortunately the current wound which has been present for 4 weeks based on what they tell me today. Patient History Information obtained from Patient, Chart. Allergies Crestor, Lipitor, lisinopril Family History Unknown History. Social History Former smoker, Marital Status - Married, Alcohol Use - Rarely, Drug Use - No History, Caffeine Use - Rarely. Medical History Cardiovascular Patient has history of Coronary Artery Disease, Hypotension, Peripheral Arterial Disease, Peripheral Venous Disease Denies history of Angina, Arrhythmia, Congestive Heart Failure, Deep Vein Thrombosis, Myocardial Infarction, Phlebitis, Vasculitis Endocrine Patient has history of Type II Diabetes Integumentary (Skin) Denies history of History of Burn Musculoskeletal Patient has history of Osteoarthritis Blood sugar is tested. Medical A Surgical History Notes nd Cardiovascular hyperilipidemia, claudication, LEFT ABI'S ABNORMAL Review of Systems (ROS) Constitutional Symptoms (General Health) Denies complaints or symptoms of Fatigue, Fever, Chills, Marked Weight Change. Eyes Denies complaints or symptoms of Dry Eyes, Vision Changes, Glasses / Contacts. Ear/Nose/Mouth/Throat Denies complaints or symptoms of Chronic sinus problems or rhinitis. Respiratory Denies complaints or symptoms of Chronic or frequent coughs, Shortness of Breath. Cardiovascular Denies complaints or symptoms of Chest pain. Gastrointestinal Denies complaints or symptoms of  Frequent diarrhea, Nausea, Vomiting. Genitourinary Denies complaints or symptoms of Frequent urination. Integumentary (Skin) Denies complaints or symptoms of Wounds. Musculoskeletal Denies complaints or symptoms of Muscle Pain, Muscle Weakness. Neurologic Denies complaints or symptoms of Numbness/parasthesias. Psychiatric Denies complaints or symptoms of Claustrophobia, Suicidal. Objective Constitutional Well-nourished and well-hydrated in no acute distress. Eyes conjunctiva clear no eyelid edema noted. pupils equal round and reactive to light and accommodation. Ears, Nose, Mouth, and Throat no gross abnormality of ear auricles or external auditory canals. normal hearing noted during conversation. mucus membranes moist. Respiratory normal breathing without difficulty. Cardiovascular Absent posterior tibial and dorsalis pedis pulses bilateral lower extremities. trace pitting edema of the bilateral lower extremities. Musculoskeletal normal gait and posture. no significant deformity or arthritic changes, no loss or range of motion, no clubbing. Psychiatric this patient is able to make decisions and demonstrates good insight into disease process. Alert and Oriented x 3. pleasant and cooperative. General Notes: Upon inspection patient's wound bed actually showed signs of fairly good granulation and epithelization at this point. This is in regard to the great toe. The second toe unfortunately is a little bit more concerning to be honest. Here we do see evidence of the toenail growing over top of a portion of the wound I carefully trimmed the toenail back using scissors and forceps. This was very soft and somewhat loose anyway. I was able to do this without any difficulty and once I removed the toenail unfortunately the patient had bone exposed right up underneath this region. Obviously no debridement nor sample was taken especially in light of the TBI's of 0.2 which unfortunately indicate he  does not have good blood flow and this is can be very difficult to heal. I am going to send him for evaluation with regard to x-ray to see if there is indeed any evidence of osteomyelitis present on x-ray we may be proceeding to MRI. Integumentary (Hair, Skin) Wound #1 status is Open. Original cause of wound was Trauma. The date acquired was: 12/01/2021. The wound is located on the Left T Great. The wound oe measures 5cm length x 2.5cm width x 0.2cm depth; 9.817cm^2 area and 1.963cm^3 volume. There is Fat Layer (Subcutaneous Tissue) exposed.  There is no tunneling or undermining noted. There is a medium amount of serosanguineous drainage noted. The wound margin is distinct with the outline attached to the wound base. There is medium (34-66%) red, pink granulation within the wound bed. There is a medium (34-66%) amount of necrotic tissue within the wound bed including Adherent Slough. Wound #2 status is Open. Original cause of wound was Trauma. The date acquired was: 12/01/2021. The wound is located on the Left T Second. The wound oe measures 0.5cm length x 1cm width x 0.1cm depth; 0.393cm^2 area and 0.039cm^3 volume. There is Fat Layer (Subcutaneous Tissue) exposed. There is no tunneling or undermining noted. There is a medium amount of serosanguineous drainage noted. The wound margin is distinct with the outline attached to the wound base. There is medium (34-66%) red, pink granulation within the wound bed. There is a medium (34-66%) amount of necrotic tissue within the wound bed including Adherent Slough. Assessment Active Problems ICD-10 Other specified peripheral vascular diseases Non-pressure chronic ulcer of other part of left foot with fat layer exposed Prediabetes Essential (primary) hypertension Plan Follow-up Appointments: Return Appointment in 1 week. - 01/08/22 @ 0800 w/ [address] and [place], [place] # 9 Bathing/ Shower/ Hygiene: May shower with protection but do not get wound  dressing(s) wet. Edema Control - Lymphedema / SCD / Other: Elevate legs to the level of the heart or above for 30 minutes daily and/or when sitting, a frequency of: Avoid standing for long periods of time. Off-Loading: Other: - Keep pressure off of Left Foot/toes Radiology ordered were: X-ray, foot - Left foot r/t Left great toe ulcer and left 2nd toe ulcer The following medication(s) was prescribed: levofloxacin oral 750 mg tablet 1 1 tablet oral taken 1 time per day for 30 days starting 01/01/2022 WOUND #1: - T Great Wound Laterality: Left oe Cleanser: Soap and Water 1 x Per Day/15 Days Discharge Instructions: May shower and wash wound with dial antibacterial soap and water prior to dressing change. Cleanser: Wound Cleanser (DME) (Generic) 1 x Per Day/15 Days Discharge Instructions: Cleanse the wound with wound cleanser prior to applying a clean dressing using gauze sponges, not tissue or cotton balls. Prim Dressing: Xeroform Occlusive Gauze Dressing, 4x4 in (DME) (Generic) 1 x Per Day/15 Days ary Discharge Instructions: Apply to wound bed as instructed Secondary Dressing: Woven Gauze Sponge, Non-Sterile 4x4 in (DME) (Generic) 1 x Per Day/15 Days Discharge Instructions: Apply over primary dressing as directed. Secured With: Insurance underwriter, Sterile 2x75 (in/in) (DME) (Generic) 1 x Per Day/15 Days Discharge Instructions: Secure with stretch gauze as directed. Secured With: 23M Medipore H Soft Cloth Surgical T ape, 4 x 10 (in/yd) (DME) (Generic) 1 x Per Day/15 Days Discharge Instructions: Secure with tape as directed. Secured With: [employer] Size 2, 10 (yds) (DME) (Generic) 1 x Per Day/15 Days WOUND #2: - T Second Wound Laterality: Left oe Cleanser: Soap and Water 1 x Per Day/15 Days Discharge Instructions: May shower and wash wound with dial antibacterial soap and water prior to dressing change. Cleanser: Wound Cleanser (DME) (Generic) 1 x Per Day/15 Days Discharge  Instructions: Cleanse the wound with wound cleanser prior to applying a clean dressing using gauze sponges, not tissue or cotton balls. Prim Dressing: Xeroform Occlusive Gauze Dressing, 4x4 in (DME) (Generic) 1 x Per Day/15 Days ary Discharge Instructions: Apply to wound bed as instructed Secondary Dressing: Woven Gauze Sponge, Non-Sterile 4x4 in (DME) (Generic) 1 x Per Day/15 Days Discharge Instructions: Apply over  primary dressing as directed. Secured With: Insurance underwriter, Sterile 2x75 (in/in) (DME) (Generic) 1 x Per Day/15 Days Discharge Instructions: Secure with stretch gauze as directed. Secured With: 50M Medipore H Soft Cloth Surgical T ape, 4 x 10 (in/yd) (DME) (Generic) 1 x Per Day/15 Days Discharge Instructions: Secure with tape as directed. Secured With: Stretch Net Size 2, 10 (yds) (DME) (Generic) 1 x Per Day/15 Days 1. Based on the current findings I would recommend for the patient that we go ahead and initiate treatment with a Xeroform gauze dressing for both wound locations I think this should do quite well I think would be better than the Silvadene cream. 2. I am also can recommend that we have the patient continue to use roll gauze to secure in place. 3. I am going to send him for an x-ray we will see with the results of the show and make any adjustments in care as necessary I am concerned about the possibility of osteomyelitis. 4. I am also going to recommend based on what we are seeing at this time that we see what the results of the repeat arterial study testing show on Monday. Depending on this result I may make a referral for second opinion with one of the vein and vascular specialist here in town to see if there is anything they think they can do to help open up flow into this left lower extremity. If were not able to get good flow going I think that the patient's chances of healing are very low. And therefore he is at high risk of losing this limb. The  patient and his wife voiced understanding we will see what the results of this test show on next Wednesday. 5. I am sending in a prescription for Levaquin for the patient today. And we did have a conversation about the fact that he is no longer on the Pletal. That was a significant interaction with the Levaquin which we do not want to do. But right now he is just taking the Plavix according to what I am being told and is no longer on the Pletal. I am going to suggest that the patient ensure that he is not we do not want any adverse interactions but to be honest there is no other oral medication other than the Levaquin which I am sending into the pharmacy today that will benefit him with the Pseudomonas finding. The good news is he tells me that his doctors took him off of the Pletal put him on the Plavix and told him not to take both therefore we should be okay in this regard. We will see patient back for reevaluation in 1 week here in the clinic. If anything worsens or changes patient will contact our office for additional recommendations. Electronic Signature(s) Signed: 01/02/2022 9:35:40 AM By: Lenda Kelp PA-C Previous Signature: 01/01/2022 3:43:04 PM Version By: Lenda Kelp PA-C Entered By: Lenda Kelp on 01/02/2022 09:35:40 -------------------------------------------------------------------------------- HxROS Details Patient Name: Date of Service: Violet Baldy, A LFO RD 01/01/2022 8:00 A M Medical Record Number: [id number] Patient Account Number: [id number] Date of Birth/Sex: Treating RN: 05/08/1941 (81 y.o. [name] Primary Care Provider: [name] Other Clinician: Referring Provider: Treating Provider/Extender: [name] in Treatment: 0 Information Obtained From Patient Chart Constitutional Symptoms (General Health) Complaints and Symptoms: Negative for: Fatigue; Fever; Chills; Marked Weight Change Eyes Complaints and  Symptoms: Negative for: Dry Eyes; Vision Changes; Glasses / Contacts Ear/Nose/Mouth/Throat  Complaints and Symptoms: Negative for: Chronic sinus problems or rhinitis Respiratory Complaints and Symptoms: Negative for: Chronic or frequent coughs; Shortness of Breath Cardiovascular Complaints and Symptoms: Negative for: Chest pain Medical History: Positive for: Coronary Artery Disease; Hypotension; Peripheral Arterial Disease; Peripheral Venous Disease Negative for: Angina; Arrhythmia; Congestive Heart Failure; Deep Vein Thrombosis; Myocardial Infarction; Phlebitis; Vasculitis Past Medical History Notes: hyperilipidemia, claudication, LEFT ABI'S ABNORMAL Gastrointestinal Complaints and Symptoms: Negative for: Frequent diarrhea; Nausea; Vomiting Genitourinary Complaints and Symptoms: Negative for: Frequent urination Integumentary (Skin) Complaints and Symptoms: Negative for: Wounds Medical History: Negative for: History of Burn Musculoskeletal Complaints and Symptoms: Negative for: Muscle Pain; Muscle Weakness Medical History: Positive for: Osteoarthritis Neurologic Complaints and Symptoms: Negative for: Numbness/parasthesias Psychiatric Complaints and Symptoms: Negative for: Claustrophobia; Suicidal Hematologic/Lymphatic Endocrine Medical History: Positive for: Type II Diabetes Blood sugar tested every day: Yes Tested : Immunological Oncologic Immunizations Pneumococcal Vaccine: Received Pneumococcal Vaccination: Yes Received Pneumococcal Vaccination On or After 60th Birthday: Yes Implantable Devices No devices added Family and Social History Unknown History: Yes; Former smoker; Marital Status - Married; Alcohol Use: Rarely; Drug Use: No History; Caffeine Use: Rarely; Financial Concerns: No; Food, Clothing or Shelter Needs: No; Support System Lacking: No; Transportation Concerns: No Electronic Signature(s) Signed: 01/01/2022 4:35:53 PM By: Lenda KelpStone III, Nevae Pinnix  PA-C Signed: 01/07/2022 3:41:21 PM By: Fonnie MuBreedlove, [name] RN Entered By: Fonnie MuBreedlove, [name] on 12/31/2021 16:38:10 -------------------------------------------------------------------------------- SuperBill Details Patient Name: Date of Service: Violet BaldyWO O D, A LFO RD 01/01/2022 Medical Record Number: [id number] Patient Account Number: [id number] Date of Birth/Sex: Treating RN: Feb 18, 1941 (81 y.o. 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.03 Prediabetes I10 Essential (primary) hypertension Facility Procedures CPT4 Code: [id number] Description: 99214 - WOUND CARE VISIT-LEV 4 EST PT Modifier: Quantity: 1 Physician Procedures : CPT4 Code Description Modifier [id number] - WC PHYS LEVEL 4 - NEW PT ICD-10 Diagnosis Description I73.89 Other specified peripheral vascular diseases 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

## 2022-01-08 ENCOUNTER — Other Ambulatory Visit (HOSPITAL_COMMUNITY): Payer: Self-pay | Admitting: Physical Therapy

## 2022-01-08 ENCOUNTER — Other Ambulatory Visit: Payer: Self-pay | Admitting: Physician Assistant

## 2022-01-08 ENCOUNTER — Other Ambulatory Visit (HOSPITAL_COMMUNITY): Payer: Self-pay | Admitting: Physician Assistant

## 2022-01-08 ENCOUNTER — Encounter (HOSPITAL_BASED_OUTPATIENT_CLINIC_OR_DEPARTMENT_OTHER): Payer: Medicare HMO | Admitting: Physician Assistant

## 2022-01-08 DIAGNOSIS — L97522 Non-pressure chronic ulcer of other part of left foot with fat layer exposed: Secondary | ICD-10-CM

## 2022-01-08 NOTE — Progress Notes (Signed)
BRANNDON, TUITE ([id number]) Visit Report for 01/08/2022 Chief Complaint Document Details Patient Name: Date of Service: Evelina Bucy LFO RD 01/08/2022 8:00 A M Medical Record Number: [id number] Patient Account Number: [id number] Date of Birth/Sex: Treating RN: [birth year] (81 y.o. [name] Primary Care Provider: [name] Other Clinician: Referring Provider: Treating Provider/Extender: [name] in Treatment: 1 Information Obtained from: Patient Chief Complaint Left 1st and 2nd toe ulcers Electronic Signature(s) Signed: 01/08/2022 8:08:25 AM By: Lenda Kelp PA-C Entered By: Lenda Kelp on 01/08/2022 89:37:34 -------------------------------------------------------------------------------- Problem List Details Patient Name: Date of Service: Violet Baldy, A LFO RD 01/08/2022 8:00 A M Medical Record Number: [id number] Patient Account Number: [id number] Date of Birth/Sex: Treating RN: 11-12-1940 (81 y.o. [name], [name] Primary Care Provider: [name] Other Clinician: Referring Provider: Treating Provider/Extender: [name] in Treatment: 1 Active Problems ICD-10 Encounter Code Description Active Date MDM Diagnosis I73.89 Other specified peripheral vascular diseases 01/01/2022 No Yes L97.522 Non-pressure chronic ulcer of other part of left foot with fat layer exposed 01/01/2022 No Yes R73.03 Prediabetes 01/01/2022 No Yes I10 Essential (primary) hypertension 01/01/2022 No Yes Inactive Problems Resolved Problems Electronic Signature(s) Signed: 01/08/2022 8:07:51 AM By: Lenda Kelp PA-C Entered By: Lenda Kelp on 01/08/2022 08:07:50

## 2022-01-09 NOTE — Progress Notes (Signed)
[name] ([id number]) Visit Report for 01/08/2022 Arrival Information Details Patient Name: Date of Service: [name] [facility] RD 01/08/2022 8:00 A M Medical Record Number: [id number] Patient Account Number: [id number] Date of Birth/Sex: Treating RN: 10-05-1940 (81 y.o. [name], [name] [name]: [name] Other Clinician: Referring [name]: Treating [name]/Extender: [name] in Treatment: 1 Visit Information History Since Last Visit Added or deleted any medications: No Patient Arrived: Ambulatory Any new allergies or adverse reactions: No Arrival Time: 07:59 Had a fall or experienced change in No Accompanied By: wife activities of daily living that may affect Transfer Assistance: None risk of falls: Patient Identification Verified: Yes Signs or symptoms of abuse/neglect since last visito No Secondary Verification Process Completed: Yes Hospitalized since last visit: No Patient Requires Transmission-Based Precautions: No Implantable device outside of the clinic excluding No Patient Has Alerts: Yes cellular tissue based products placed in the center Patient Alerts: ABI's:02/23 R: N/C L:0.6 since last visit: TBI's:02/23 R:0.62L:0.26 Has Dressing in Place as Prescribed: Yes Pain Present Now: No Electronic Signature(s) Signed: 01/09/2022 3:58:21 PM By: Richard Mu RN Entered By: [name] on 01/08/2022 08:00:00 -------------------------------------------------------------------------------- Clinic Level of Lloyd Assessment Details Patient Name: Date of Service: [name] LFO RD 01/08/2022 8:00 A M Medical Record Number: [id number] Patient Account Number: [id number] Date of Birth/Sex: Treating RN: 04-19-1941 (81 y.o. [name], [name] [name] Other Clinician: Referring [name]: Treating [name]/Extender: [name] in Treatment: 1 Clinic Level of  Lloyd Assessment Items TOOL 4 Quantity Score X- 1 0 Use when only an EandM is performed on FOLLOW-UP visit ASSESSMENTS - Nursing Assessment / Reassessment X- 1 10 Reassessment of Co-morbidities (includes updates in patient status) X- 1 5 Reassessment of Adherence to Treatment Plan ASSESSMENTS - Wound and Skin A ssessment / Reassessment []  - 0 Simple Wound Assessment / Reassessment - one wound X- 2 5 Complex Wound Assessment / Reassessment - multiple wounds []  - 0 Dermatologic / Skin Assessment (not related to wound area) ASSESSMENTS - Focused Assessment X- 1 5 Circumferential Edema Measurements - multi extremities []  - 0 Nutritional Assessment / Counseling / Intervention []  - 0 Lower Extremity Assessment (monofilament, tuning fork, pulses) []  - 0 Peripheral Arterial Disease Assessment (using hand held doppler) ASSESSMENTS - Ostomy and/or Continence Assessment and Lloyd []  - 0 Incontinence Assessment and Management []  - 0 Ostomy Lloyd Assessment and Management (repouching, etc.) PROCESS - Coordination of Lloyd []  - 0 Simple Patient / Family Education for ongoing Lloyd X- 1 20 Complex (extensive) Patient / Family Education for ongoing Lloyd X- 1 10 Staff obtains , Records, T Results / Process Orders est []  - 0 Staff telephones HHA, Nursing Homes / Clarify orders / etc []  - 0 Routine Transfer to another Facility (non-emergent condition) []  - 0 Routine Hospital Admission (non-emergent condition) []  - 0 New Admissions / / Ordering NPWT Apligraf, etc. , []  - 0 Emergency Hospital Admission (emergent condition) []  - 0 Simple Discharge Coordination X- 1 15 Complex (extensive) Discharge Coordination PROCESS - Special Needs []  - 0 Pediatric / Minor Patient Management []  - 0 Isolation Patient Management []  - 0 Hearing / Language / Visual special needs []  - 0 Assessment of Community assistance (transportation, D/C planning, etc.) []  -  0 Additional assistance / Altered mentation []  - 0 Support Surface(s) Assessment (bed, cushion, seat, etc.) INTERVENTIONS - Wound Cleansing / Measurement []  - 0 Simple Wound Cleansing -  one wound X- 2 5 Complex Wound Cleansing - multiple wounds X- 1 5 Wound Imaging (photographs - any number of wounds) []  - 0 Wound Tracing (instead of photographs) []  - 0 Simple Wound Measurement - one wound X- 2 5 Complex Wound Measurement - multiple wounds INTERVENTIONS - Wound Dressings []  - 0 Small Wound Dressing one or multiple wounds X- 2 15 Medium Wound Dressing one or multiple wounds []  - 0 Large Wound Dressing one or multiple wounds X- 1 5 Application of Medications - topical []  - 0 Application of Medications - injection INTERVENTIONS - Miscellaneous []  - 0 External ear exam []  - 0 Specimen Collection (cultures, biopsies, blood, body fluids, etc.) []  - 0 Specimen(s) / Culture(s) sent or taken to Lab for analysis []  - 0 Patient Transfer (multiple staff / / Similar devices) []  - 0 Simple Staple / Suture removal (25 or less) []  - 0 Complex Staple / Suture removal (26 or more) []  - 0 Hypo / Hyperglycemic Management (close monitor of Blood Glucose) []  - 0 Ankle / Brachial Index (ABI) - do not check if billed separately X- 1 5 Vital Signs Has the patient been seen at the hospital within the last three years: Yes Total Score: 140 Level Of Lloyd: New/Established - Level 4 Electronic Signature(s) Signed: 01/09/2022 3:58:21 PM By: RN Entered By: on 01/08/2022 08:49:24 -------------------------------------------------------------------------------- Encounter Discharge Information Details Patient Name: Date of Service: D, A LFO RD 01/08/2022 8:00 A M Medical Record Number: Patient Account Number: Date of Birth/Sex: Treating RN: [birth year] (81 y.o. , [name] [name]:  Other Clinician: Referring [name]: Treating [name]/Extender: in Treatment: 1 Encounter Discharge Information Items Discharge Condition: Stable Ambulatory Status: Ambulatory Discharge Destination: Home Transportation: Private Auto Accompanied By: 01/11/2022 Schedule Follow-up Appointment: Yes Clinical Summary of Lloyd: Patient Declined Electronic Signature(s) Signed: 01/09/2022 3:58:21 PM By: Richard Mu RN Entered By: 01/10/2022 on 01/08/2022 08:49:58 -------------------------------------------------------------------------------- Lower Extremity Assessment Details Patient Name: Date of Service: 01/10/2022, A LFO RD 01/08/2022 8:00 A M Medical Record Number: [id number] Patient Account Number: 04/11/1941 Date of Birth/Sex: Treating RN: 07/31/1940 (80 y.o. [name], [name] Pradyun Ishman: [name] Other Clinician: Referring Malerie Eakins: Treating Soriya Worster/Extender: Denton Lank in Treatment: 1 Edema Assessment Assessed: 01/11/2022: Yes] [name]: No] Edema: [Left: Ye] [Right: s] Calf Left: Right: Point of Measurement: 37 cm From Medial Instep 37.5 cm Ankle Left: Right: Point of Measurement: 10 cm From Medial Instep 24.5 cm Vascular Assessment Pulses: Dorsalis Pedis Palpable: [Left:Yes] Posterior Tibial Palpable: [Left:Yes] Electronic Signature(s) Signed: 01/09/2022 3:58:21 PM By: 01/10/2022 RN Entered By: Violet Baldy on 01/08/2022 08:00:16 -------------------------------------------------------------------------------- Multi-Disciplinary Lloyd Plan Details Patient Name: Date of Service: [id number], A LFO RD 01/08/2022 8:00 A M Medical Record Number: 04/11/1941 Patient Account Number: [id number] Date of Birth/Sex: Treating RN: 1941/05/10 (81 y.o. [name], [name] Lillymae Duet: Kyra Searles Other Clinician: Referring Bomani Oommen: Treating Teruo Stilley/Extender: Franne Forts in Treatment: 1 Active Inactive Orientation to the Wound Lloyd Program Nursing Diagnoses: Knowledge deficit related to the wound healing center program Goals: Patient/caregiver will verbalize understanding of the Wound Healing Center Program Date Initiated: 01/01/2022 Target Resolution Date: 01/24/2022 Goal Status: Active Interventions: Provide education on orientation to the wound center Notes: Tissue Oxygenation Nursing Diagnoses: Actual ineffective tissue perfusion; peripheral (select once diagnosis is confirmed) Knowledge deficit related to disease process  and management Potential alteration in peripheral tissue perfusion (select prior to confirmation of diagnosis) Goals: Invasive arterial studies completed as ordered Date Initiated: 01/01/2022 Target Resolution Date: 01/25/2022 Goal Status: Active Non-invasive arterial studies are completed as ordered Date Initiated: 01/01/2022 Target Resolution Date: 01/24/2022 Goal Status: Active Patient/caregiver will verbalize understanding of disease process and disease management Date Initiated: 01/01/2022 Target Resolution Date: 01/25/2022 Goal Status: Active Interventions: Assess patient understanding of disease process and management upon diagnosis and as needed Assess peripheral arterial status upon admission and as needed Provide education on tissue oxygenation and ischemia Notes: Wound/Skin Impairment Nursing Diagnoses: Impaired tissue integrity Knowledge deficit related to ulceration/compromised skin integrity Goals: Patient will have a decrease in wound volume by X% from date: (specify in notes) Date Initiated: 01/01/2022 Target Resolution Date: 01/23/2022 Goal Status: Active Patient/caregiver will verbalize understanding of skin Lloyd regimen Date Initiated: 01/01/2022 Target Resolution Date: 01/24/2022 Goal Status: Active Ulcer/skin breakdown will have a volume reduction of 30% by week 4 Date Initiated:  01/01/2022 Target Resolution Date: 01/25/2022 Goal Status: Active Interventions: Assess patient/caregiver ability to obtain necessary supplies Assess patient/caregiver ability to perform ulcer/skin Lloyd regimen upon admission and as needed Assess ulceration(s) every visit Notes: Electronic Signature(s) Signed: 01/09/2022 3:58:21 PM By: Richard Mu RN Entered By: [name] on 01/08/2022 08:38:12 -------------------------------------------------------------------------------- Pain Assessment Details Patient Name: Date of Service: Violet Baldy, A LFO RD 01/08/2022 8:00 A M Medical Record Number: [id number] Patient Account Number: [id number] Date of Birth/Sex: Treating RN: 08-05-1940 (81 y.o. [name] Primary Lloyd Haleema Vanderheyden: [name] Other Clinician: Referring Deakin Lacek: Treating Rogen Porte/Extender: [name] in Treatment: 1 Active Problems Location of Pain Severity and Description of Pain Patient Has Paino No Site Locations Pain Management and Medication Current Pain Management: Electronic Signature(s) Signed: 01/09/2022 3:58:21 PM By: Richard Mu RN Entered By: [name] on 01/08/2022 08:00:09 -------------------------------------------------------------------------------- Patient/Caregiver Education Details Patient Name: Date of Service: Violet Baldy, A LFO RD 8/23/2023andnbsp8:00 A M Medical Record Number: [id number] Patient Account Number: [id number] Date of Birth/Gender: Treating RN: 08/03/1940 (81 y.o. [name] Primary Lloyd Physician: [name] Other Clinician: Referring Physician: Treating Physician/Extender: [name] in Treatment: 1 Education Assessment Education Provided To: Patient Education Topics Provided Tissue Oxygenation: Methods: Explain/Verbal Responses: Reinforcements needed, State content correctly Welcome T [facility]: o Methods:  Explain/Verbal Responses: Reinforcements needed, State content correctly Electronic Signature(s) Signed: 01/09/2022 3:58:21 PM By: Richard Mu RN Entered By: [name] on 01/08/2022 08:38:25 -------------------------------------------------------------------------------- Wound Assessment Details Patient Name: Date of Service: Violet Baldy, A LFO RD 01/08/2022 8:00 A M Medical Record Number: [id number] Patient Account Number: [id number] Date of Birth/Sex: Treating RN: February 13, 1941 (81 y.o. [name], [name] Adelie Croswell: [name] Other Clinician: Referring Lizania Bouchard: Treating Loie Jahr/Extender: [name] in Treatment: 1 Wound Status Wound Number: 1 Primary Arterial Insufficiency Ulcer Etiology: Wound Location: Left T Great oe Wound Open Wounding Event: Trauma Status: Date Acquired: 12/01/2021 Comorbid Coronary Artery Disease, Hypotension, Peripheral Arterial Weeks Of Treatment: 1 History: Disease, Peripheral Venous Disease, Type II Diabetes, Clustered Wound: No Osteoarthritis Photos Wound Measurements Length: (cm) 5 Width: (cm) 2.5 Depth: (cm) 0.2 Area: (cm) 9.817 Volume: (cm) 1.963 % Reduction in Area: 0% % Reduction in Volume: 0% Epithelialization: Small (1-33%) Wound Description Classification: Full Thickness With Exposed Support Structures Wound Margin: Distinct, outline attached Exudate Amount: Medium Exudate Type: Serosanguineous Exudate Color: red, brown Foul Lloyd After Cleansing: No Slough/Fibrino Yes Wound Bed  Granulation Amount: Medium (34-66%) Exposed Structure Granulation Quality: Red, Pink Fascia Exposed: No Necrotic Amount: Medium (34-66%) Fat Layer (Subcutaneous Tissue) Exposed: Yes Tendon Exposed: No Muscle Exposed: No Joint Exposed: No Bone Exposed: No Treatment Notes Wound #1 (Toe Great) Wound Laterality: Left Cleanser Soap and Water Discharge Instruction: May shower and wash wound with  dial antibacterial soap and water prior to dressing change. Wound Cleanser Discharge Instruction: Cleanse the wound with wound cleanser prior to applying a clean dressing using gauze sponges, not tissue or cotton balls. Peri-Wound Lloyd Topical Primary Dressing Xeroform Occlusive Gauze Dressing, 4x4 in Discharge Instruction: Apply to wound bed as instructed Secondary Dressing Woven Gauze Sponge, Non-Sterile 4x4 in Discharge Instruction: Apply over primary dressing as directed. Secured With Conforming Stretch Gauze Bandage, Sterile 2x75 (in/in) Discharge Instruction: Secure with stretch gauze as directed. 66M Medipore H Soft Cloth Surgical T ape, 4 x 10 (in/yd) Discharge Instruction: Secure with tape as directed. Stretch Net Size 2, 10 (yds) Compression Wrap Compression Stockings Add-Ons Electronic Signature(s) Signed: 01/09/2022 3:58:21 PM By: Richard Mu RN Entered By: [name] on 01/08/2022 08:23:33 -------------------------------------------------------------------------------- Wound Assessment Details Patient Name: Date of Service: Violet Baldy, A LFO RD 01/08/2022 8:00 A M Medical Record Number: [id number] Patient Account Number: [id number] Date of Birth/Sex: Treating RN: 09/24/1940 (81 y.o. [name], [name] Anuj Summons: [name] Other Clinician: Referring Staisha Winiarski: Treating Demian Maisel/Extender: [name] in Treatment: 1 Wound Status Wound Number: 2 Primary Arterial Insufficiency Ulcer Etiology: Wound Location: Left T Second oe Wound Open Wounding Event: Trauma Status: Date Acquired: 12/01/2021 Comorbid Coronary Artery Disease, Hypotension, Peripheral Arterial Weeks Of Treatment: 1 History: Disease, Peripheral Venous Disease, Type II Diabetes, Clustered Wound: No Osteoarthritis Photos Wound Measurements Length: (cm) 0.5 Width: (cm) 1 Depth: (cm) 0.1 Area: (cm) 0.393 Volume: (cm) 0.039 % Reduction in  Area: 0% % Reduction in Volume: 0% Epithelialization: Small (1-33%) Wound Description Classification: Full Thickness With Exposed Support Structures Wound Margin: Distinct, outline attached Exudate Amount: Medium Exudate Type: Serosanguineous Exudate Color: red, brown Foul Lloyd After Cleansing: No Slough/Fibrino Yes Wound Bed Granulation Amount: Medium (34-66%) Exposed Structure Granulation Quality: Red, Pink Fascia Exposed: No Necrotic Amount: Medium (34-66%) Fat Layer (Subcutaneous Tissue) Exposed: Yes Tendon Exposed: No Muscle Exposed: No Joint Exposed: No Bone Exposed: No Treatment Notes Wound #2 (Toe Second) Wound Laterality: Left Cleanser Soap and Water Discharge Instruction: May shower and wash wound with dial antibacterial soap and water prior to dressing change. Wound Cleanser Discharge Instruction: Cleanse the wound with wound cleanser prior to applying a clean dressing using gauze sponges, not tissue or cotton balls. Peri-Wound Lloyd Topical Primary Dressing Xeroform Occlusive Gauze Dressing, 4x4 in Discharge Instruction: Apply to wound bed as instructed Secondary Dressing Woven Gauze Sponge, Non-Sterile 4x4 in Discharge Instruction: Apply over primary dressing as directed. Secured With Conforming Stretch Gauze Bandage, Sterile 2x75 (in/in) Discharge Instruction: Secure with stretch gauze as directed. 66M Medipore H Soft Cloth Surgical T ape, 4 x 10 (in/yd) Discharge Instruction: Secure with tape as directed. Stretch Net Size 2, 10 (yds) Compression Wrap Compression Stockings Add-Ons Electronic Signature(s) Signed: 01/09/2022 3:58:21 PM By: Richard Mu RN Entered By: [name] on 01/08/2022 08:23:25 -------------------------------------------------------------------------------- Vitals Details Patient Name: Date of Service: Cleora Fleet D, A LFO RD 01/08/2022 8:00 A M Medical Record Number: [id number] Patient Account Number: [id number] Date of  Birth/Sex: Treating RN: 08/24/1940 (81 y.o. [name] Primary Lloyd Amrom Ore: [name] Other Clinician: Referring Armina Galloway: Treating Quaid Yeakle/Extender: Lenda Kelp  Saguier, Jessie FootEdward Weeks in Treatment: 1 Vital Signs Time Taken: 08:02 Temperature (F): 98.4 Pulse (bpm): 72 Respiratory Rate (breaths/min): 17 Blood Pressure (mmHg): 135/72 Reference Range: 80 - 120 mg / dl Electronic Signature(s) Signed: 01/09/2022 3:58:21 PM By: Richard MuBreedlove, [name] RN Entered By: Richard MuBreedlove, [name] on 01/08/2022 08:03:39

## 2022-01-14 ENCOUNTER — Ambulatory Visit: Payer: Medicare HMO | Attending: Cardiovascular Disease | Admitting: Cardiovascular Disease

## 2022-01-14 ENCOUNTER — Encounter: Payer: Self-pay | Admitting: Cardiovascular Disease

## 2022-01-14 DIAGNOSIS — I10 Essential (primary) hypertension: Secondary | ICD-10-CM | POA: Diagnosis not present

## 2022-01-14 DIAGNOSIS — I739 Peripheral vascular disease, unspecified: Secondary | ICD-10-CM | POA: Diagnosis not present

## 2022-01-14 DIAGNOSIS — Z951 Presence of aortocoronary bypass graft: Secondary | ICD-10-CM | POA: Diagnosis not present

## 2022-01-14 DIAGNOSIS — E785 Hyperlipidemia, unspecified: Secondary | ICD-10-CM

## 2022-01-14 NOTE — Progress Notes (Signed)
01/14/2022 [name]   Feb 21, 1941  [id number]  Primary Physician Saguier, Kateri Mc Primary Cardiologist: [name] MD FACP, [place], [place], [place]  HPI:  [name] is a 81 y.o.  mildly overweight married Caucasian male whoI last saw him in the office 04/17/2021.Marland Kitchen  He is accompanied by his wife [name] today.  He is the father of one child, grandfather to  one grandchild. I last saw him in the office 06/26/2020. 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 [name] December 27, 2004, for left main 3-vessel disease. He had a LIMA to his LAD, a vein to his circumflex marginal branch as well as to the PDA. His other problems include hypertension and hyperlipidemia. He denies chest pain or shortness of breath. He does have left ABI in the 0.85range with a high-frequency signal in his left SFA by duplex ultrasound 10/20/12 but did not claudication at that time. His last Myoview performed 06/27/11 was nonischemic.4 years ago he developed lifestyle limiting bilateral lower extremity claudication. Should be noted that he did stop smoking at the time of his bypass surgery in 2006. He underwent peripheral angiography by myself 08/04/16 revealing a long segment calcified occlusion right SFA and high-grade diffuse calcified left SFA stenosis. I did not think that he could be percutaneously revascularized on the right, and therefore opted for medical therapy. This Pletal has been titrated to 100 mg by mouth twice a day without significant improvement in his claudication symptoms. These are lifestyle limiting. He denies chest pain or shortness of breath.   I performed peripheral angiography and endovascular therapy of his diffusely calcified and highly obstructive left SFA using diamondback orbital rotational atherectomy followed by drug-eluting balloon plasty. This left ABI increased from 0.63-.85 and his claudication has significantly improved and were resolved.    He  had  progressive upper extremity effort angina.  He did have a negative Myoview 07/14/2019.  He saw Corine Shelter, PA-C in the office 04/27/2020.  His Imdur was increased but this is not affected the frequency and/or severity of his symptoms.  He also complains of claudication bilaterally.  He has a known occluded right SFA from prior angiography status post orbital atherectomy, PTA of his subtotally occluded calcified left SFA 04/13/2017.  Recent Dopplers performed 04/26/2020 revealed a left ABI of 0.52 with an occluded left popliteal artery.   I performed right left heart cath on him 06/28/2020 revealing a left dominant system, normal LV function, patent vein to the distal left PDA, LM branch and LIMA to the LAD.  He did have a 90% apical LAD stenosis after LIMA insertion.  His hemodynamics are stable.  He says he feels better since the heart cath although I did not do an intervention.  I also performed abdominal aortography and bilateral iliac angiography revealing no obstructive disease.  He does complain of bilateral calf claudication.  The etiology of his chest pain cannot be explained by the findings on his heart cath I have reassured him and his wife [name].   I performed peripheral angiography on him 12/06/2020 revealing occluded SFAs bilaterally with three-vessel runoff.  He had a 90% calcified exophytic distal left common iliac artery stenosis.  I performed orbital atherectomy, PTA and covered stenting with an 8 mm x 29 mm long VBX covered stent postdilated with a 10 mm x 2 cm balloon.  His follow-up Doppler studies revealed unchanged ABIs although his velocities improved.  His claudication improved as  well although given his SFAs bilaterally.  He still has some lifestyle limiting claudication.  Since I saw him in the office 9 months ago he is remained stable.  He gets occasional atypical noncardiac left upper chest pain although his anatomy by cath a year ago remained stable.  His lipid profile is excellent on  Praluent.  He unfortunately developed a wound on his left foot and is being treated at the wound care center for last several weeks without improvement.  He is referred here for further evaluation as well as to Dr. Lemar Livings at [facility] tomorrow.  I believe he will require left femoropopliteal bypass grafting for limb salvage.   Current Meds  Medication Sig   amLODipine (NORVASC) 10 MG tablet TAKE 1 TABLET BY MOUTH EVERY DAY   aspirin 81 MG tablet Take 81 mg by mouth daily.   cephALEXin (KEFLEX) 500 MG capsule Take 1 capsule (500 mg total) by mouth 2 (two) times daily.   cilostazol (PLETAL) 50 MG tablet TAKE 1 TABLET BY MOUTH TWICE A DAY   clopidogrel (PLAVIX) 75 MG tablet TAKE 1 TABLET BY MOUTH EVERY DAY   Coenzyme Q10 (COQ10) 200 MG CAPS Take 200 mg by mouth daily.   famotidine (PEPCID) 20 MG tablet Take 1 tablet (20 mg total) by mouth daily.   fluticasone (FLONASE) 50 MCG/ACT nasal spray Place 2 sprays into both nostrils daily.   isosorbide mononitrate (IMDUR) 60 MG 24 hr tablet TAKE 1 TABLET BY MOUTH EVERY DAY   levocetirizine (XYZAL) 5 MG tablet Take 1 tablet (5 mg total) by mouth every evening.   metFORMIN (GLUCOPHAGE) 500 MG tablet Take 1 tablet (500 mg total) by mouth 2 (two) times daily with a meal.   metoprolol tartrate (LOPRESSOR) 50 MG tablet Take 50 mg by mouth daily.   nitroGLYCERIN (NITROSTAT) 0.4 MG SL tablet Place 1 tablet (0.4 mg total) under the tongue every 5 (five) minutes as needed for chest pain.   Omega-3 Fatty Acids (FISH OIL) 1200 MG CAPS Take 2 capsules (2,400 mg total) by mouth every morning.   PRALUENT 150 MG/ML SOAJ INJECT 150 MG INTO THE SKIN EVERY 14 (FOURTEEN) DAYS.     Allergies  Allergen Reactions   Crestor [Rosuvastatin] Other (See Comments)    Myalgias   Lipitor [Atorvastatin]     myalgias   Lisinopril     cough    Social History   Socioeconomic History   Marital status: Married    Spouse name: cathy   Number of children: 1   Years of  education: 12   Highest education level: Not on file  Occupational History   Occupation: retired  Tobacco Use   Smoking status: Former    Packs/day: 1.00    Years: 50.00    Total pack years: 50.00    Types: Cigarettes    Quit date: 12/17/2004    Years since quitting: 17.0   Smokeless tobacco: Never  Vaping Use   Vaping Use: Never used  Substance and Sexual Activity   Alcohol use: Yes    Comment: occasionally   Drug use: No   Sexual activity: Not on file  Other Topics Concern   Not on file  Social History Narrative   Lives with wife   Caffeine - coffee, 1 cup daily   Social Determinants of Health   Financial Resource Strain: Not on file  Food Insecurity: Not on file  Transportation Needs: Not on file  Physical Activity: Not on file  Stress:  Not on file  Social Connections: Not on file  Intimate Partner Violence: Not on file     Review of Systems: General: negative for chills, fever, night sweats or weight changes.  Cardiovascular: negative for chest pain, dyspnea on exertion, edema, orthopnea, palpitations, paroxysmal nocturnal dyspnea or shortness of breath Dermatological: negative for rash Respiratory: negative for cough or wheezing Urologic: negative for hematuria Abdominal: negative for nausea, vomiting, diarrhea, bright red blood per rectum, melena, or hematemesis Neurologic: negative for visual changes, syncope, or dizziness All other systems reviewed and are otherwise negative except as noted above.    Blood pressure 100/60, pulse 74, height 6\' 1"  (1.854 m), weight 219 lb (99.3 kg), SpO2 94 %.  General appearance: alert and no distress Neck: no adenopathy, no carotid bruit, no JVD, supple, symmetrical, trachea midline, and thyroid not enlarged, symmetric, no tenderness/mass/nodules Lungs: clear to auscultation bilaterally Heart: regular rate and rhythm, S1, S2 normal, no murmur, click, rub or gallop Extremities: extremities normal, atraumatic, no cyanosis or  edema Pulses: Absent pedal pulses Skin: Wound left foot which is wrapped Neurologic: Grossly normal  EKG not performed today  ASSESSMENT AND PLAN:   Hx of CABG History of CAD status post CABG by Dr.  12/27/2004 for left main/three-vessel disease.  I performed cardiac catheterization on him 06/28/2020 revealing patent grafts with an apical LAD lesion.  I do not think he had a "culprit lesion that would be causing his chest pain symptoms.  He is on good antianginal medications.  HTN (hypertension) History of essential hypertension blood pressure measured today at 100/60.  He is on amlodipine, metoprolol.  Dyslipidemia History of hyperlipidemia on Praluent with lipid profile performed 10/21/2021 revealing a total cholesterol of 68, LDL of 60 and HDL 40.  Peripheral vascular disease (HCC) History of peripheral arterial disease status post left SFA Dynabac orbital rotational atherectomy and drug-coated balloon angioplasty in the past.  His last intervention was performed 12/06/2020.  I performed orbital atherectomy and VBX covered stenting of a high-grade calcified left common iliac artery stenosis.  He did have a high-grade distal left common femoral artery stenosis, profunda femoris disease and occluded left SFA from the origin down to the adductor canal.  He also had 80% popliteal artery stenosis with three-vessel runoff.  He is developed a wound on his left foot and has been seen by the wound care center for the last several weeks without improvement.  He is got a scheduled appointment with Dr. 12/08/2020 tomorrow at [facility].  I believe he Randie Heinz will require left femoropopliteal bypass grafting for limb salvage.  From a cardiology perspective, he is cleared for his surgical procedure.     Allyson Sabal MD FACP,FACC,FAHA, [facility] 01/14/2022 1:37 PM

## 2022-01-14 NOTE — Assessment & Plan Note (Signed)
History of peripheral arterial disease status post left SFA Dynabac orbital rotational atherectomy and drug-coated balloon angioplasty in the past.  His last intervention was performed 12/06/2020.  I performed orbital atherectomy and VBX covered stenting of a high-grade calcified left common iliac artery stenosis.  He did have a high-grade distal left common femoral artery stenosis, profunda femoris disease and occluded left SFA from the origin down to the adductor canal.  He also had 80% popliteal artery stenosis with three-vessel runoff.  He is developed a wound on his left foot and has been seen by the wound care center for the last several weeks without improvement.  He is got a scheduled appointment with Dr. Randie Heinz tomorrow at [facility].  I believe he Allyson Sabal will require left femoropopliteal bypass grafting for limb salvage.  From a cardiology perspective, he is cleared for his surgical procedure.

## 2022-01-14 NOTE — Assessment & Plan Note (Signed)
History of CAD status post CABG by Dr. [name]  12/27/2004 for left main/three-vessel disease.  I performed cardiac catheterization on him 06/28/2020 revealing patent grafts with an apical LAD lesion.  I do not think he had a "culprit lesion that would be causing his chest pain symptoms.  He is on good antianginal medications.

## 2022-01-14 NOTE — Patient Instructions (Signed)
Medication Instructions:  Your physician recommends that you continue on your current medications as directed. Please refer to the Current Medication list given to you today.  *If you need a refill on your cardiac medications before your next appointment, please call your pharmacy*   Follow-Up: At [facility], you and your health needs are our priority.  As part of our continuing mission to provide you with exceptional heart care, we have created designated Provider Care Teams.  These Care Teams include your primary Cardiologist (physician) and Advanced Practice Providers (APPs -  Physician Assistants and Nurse Practitioners) who all work together to provide you with the care you need, when you need it.  We recommend signing up for the patient portal called "MyChart".  Sign up information is provided on this After Visit Summary.  MyChart is used to connect with patients for Virtual Visits (Telemedicine).  Patients are able to view lab/test results, encounter notes, upcoming appointments, etc.  Non-urgent messages can be sent to your provider as well.   To learn more about what you can do with MyChart, go to [email or web address].    Your next appointment:   3 month(s)  The format for your next appointment:   In Person  Provider:   Jonathan Berry, MD    

## 2022-01-14 NOTE — Assessment & Plan Note (Signed)
History of essential hypertension blood pressure measured today at 100/60.  He is on amlodipine, metoprolol.

## 2022-01-14 NOTE — Assessment & Plan Note (Signed)
History of hyperlipidemia on Praluent with lipid profile performed 10/21/2021 revealing a total cholesterol of 68, LDL of 60 and HDL 40.

## 2022-01-15 ENCOUNTER — Ambulatory Visit: Payer: Medicare HMO | Admitting: Vascular Surgery

## 2022-01-15 ENCOUNTER — Encounter (HOSPITAL_BASED_OUTPATIENT_CLINIC_OR_DEPARTMENT_OTHER): Payer: Medicare HMO | Admitting: Physician Assistant

## 2022-01-15 ENCOUNTER — Encounter: Payer: Self-pay | Admitting: Vascular Surgery

## 2022-01-15 ENCOUNTER — Other Ambulatory Visit: Payer: Self-pay

## 2022-01-15 VITALS — BP 119/73 | HR 66 | Temp 97.7°F | Resp 20 | Ht 73.0 in | Wt 218.0 lb

## 2022-01-15 DIAGNOSIS — I739 Peripheral vascular disease, unspecified: Secondary | ICD-10-CM

## 2022-01-15 DIAGNOSIS — L97522 Non-pressure chronic ulcer of other part of left foot with fat layer exposed: Secondary | ICD-10-CM | POA: Diagnosis not present

## 2022-01-15 NOTE — Progress Notes (Signed)
Patient ID: [name], male   DOB: 06-04-1940, 81 y.o.   MRN: [id number]  Reason for Consult: New Patient (Initial Visit)   Referred by [name], PA-C  Subjective:     HPI:  [name] is a 81 y.o. male with history of claudication has undergone bilateral lower extremity endovascular intervention in the past.  Most recently has a left common iliac artery stent placed a year ago and had appear to have occluded left SFA at that time.  He now has 6-week history of wound on the left great toe for which he is being followed at the wound care center.  He was evaluated by Dr. Allyson Sabal yesterday and is now here for discussion of possible need for bypass surgery.  Past Medical History:  Diagnosis Date   Claudication (HCC) 06/27/2011   Left ABIs abnormal values with mild-moderate arterial insufficiency, right SFA moderate amount of mixed density plaque elevating velocities suggestive 50-69% diameter reduction   Coronary artery disease 06/27/2011   Normal Lexiscan, EF 64, post-stress EF 64, no EKG changes   Diabetes mellitus without complication (HCC)    prediabetic   Hyperlipidemia    Hypertension    Impotence of organic origin    Osteoarthritis    Other testicular hypofunction    PVD (peripheral vascular disease) (HCC)    Family History  Problem Relation Age of Onset   Hypertension Mother        65   Clotting disorder Father        20   Diabetes Brother        heart surgery   Past Surgical History:  Procedure Laterality Date   ABDOMINAL AORTOGRAM N/A 06/28/2020   Procedure: ABDOMINAL AORTOGRAM;  Surgeon: [name], MD;  Location: [facility]IVE CV [facility];  Service: Cardiovascular;  Laterality: N/A;   CARDIAC CATHETERIZATION  12/26/2004   Left main-90% distal tapered stenosis, left circumflex-95% ostial stenosis, first marginal branch 99% ostial branch, posterior descending artery 99% ostial stenosis, 60-70% segmental mid stenosis, needed CABG   CORONARY ARTERY BYPASS GRAFT   12/27/2004   Left main 3-vessel disease, LIMA to LAD, saphenous vein to circumflexmarginal branch and to the PDA   INGUINAL HERNIA REPAIR Left 2005   LOWER EXTREMITY ANGIOGRAPHY N/A 04/13/2017   Procedure: LOWER EXTREMITY ANGIOGRAPHY;  Surgeon: [name], MD;  Location: [facility];  Service: Cardiovascular;  Laterality: N/A;   LOWER EXTREMITY ANGIOGRAPHY Bilateral 12/06/2020   Procedure: Lower Extremity Angiography;  Surgeon: [name], MD;  Location: [facility] INVASIVE CV LAB;  Service: Cardiovascular;  Laterality: Bilateral;  Limited Study   LOWER EXTREMITY INTERVENTION N/A 08/04/2016   Procedure: Lower Extremity Intervention;  Surgeon: [name], MD;  Location: [facility] INVASIVE CV LAB;  Service: Cardiovascular;  Laterality: N/A;   PERIPHERAL VASCULAR ATHERECTOMY Left 04/13/2017   Procedure: PERIPHERAL VASCULAR ATHERECTOMY;  Surgeon: [name], MD;  Location: [facility] INVASIVE CV LAB;  Service: Cardiovascular;  Laterality: Left;  SFA   PERIPHERAL VASCULAR ATHERECTOMY  12/06/2020   Procedure: PERIPHERAL VASCULAR ATHERECTOMY;  Surgeon: [name], MD;  Location: [facility] INVASIVE CV LAB;  Service: Cardiovascular;;  Lt Iliac   PERIPHERAL VASCULAR BALLOON ANGIOPLASTY Left 04/13/2017   Procedure: PERIPHERAL VASCULAR BALLOON ANGIOPLASTY;  Surgeon: [name], MD;  Location: [facility];  Service: Cardiovascular;  Laterality: Left;  SFA   PERIPHERAL VASCULAR INTERVENTION  12/06/2020   Procedure: PERIPHERAL VASCULAR INTERVENTION;  Surgeon: [name], MD;  Location: [facility] INVASIVE  CV LAB;  Service: Cardiovascular;;  Lt Iliac   RIGHT/LEFT HEART CATH AND CORONARY/GRAFT ANGIOGRAPHY N/A 06/28/2020   Procedure: RIGHT/LEFT HEART CATH AND CORONARY/GRAFT ANGIOGRAPHY;  Surgeon: [name], MD;  Location: [facility] INVASIVE CV LAB;  Service: Cardiovascular;  Laterality: N/A;    Short Social History:  Social History   Tobacco Use   Smoking status: Former    Packs/day: 1.00     Years: 50.00    Total pack years: 50.00    Types: Cigarettes    Quit date: 12/17/2004    Years since quitting: 17.0   Smokeless tobacco: Never  Substance Use Topics   Alcohol use: Yes    Comment: occasionally    Allergies  Allergen Reactions   Crestor [Rosuvastatin] Other (See Comments)    Myalgias   Lipitor [Atorvastatin]     myalgias   Lisinopril     cough    Current Outpatient Medications  Medication Sig Dispense Refill   amLODipine (NORVASC) 10 MG tablet TAKE 1 TABLET BY MOUTH EVERY DAY 90 tablet 3   aspirin 81 MG tablet Take 81 mg by mouth daily.     cephALEXin (KEFLEX) 500 MG capsule Take 1 capsule (500 mg total) by mouth 2 (two) times daily. 6 capsule 0   cilostazol (PLETAL) 50 MG tablet TAKE 1 TABLET BY MOUTH TWICE A DAY 180 tablet 3   clopidogrel (PLAVIX) 75 MG tablet TAKE 1 TABLET BY MOUTH EVERY DAY 90 tablet 3   Coenzyme Q10 (COQ10) 200 MG CAPS Take 200 mg by mouth daily.     famotidine (PEPCID) 20 MG tablet Take 1 tablet (20 mg total) by mouth daily. 90 tablet 1   fluticasone (FLONASE) 50 MCG/ACT nasal spray Place 2 sprays into both nostrils daily. 16 g 1   isosorbide mononitrate (IMDUR) 60 MG 24 hr tablet TAKE 1 TABLET BY MOUTH EVERY DAY 90 tablet 3   levocetirizine (XYZAL) 5 MG tablet Take 1 tablet (5 mg total) by mouth every evening. 90 tablet 1   levofloxacin (LEVAQUIN) 750 MG tablet Take 750 mg by mouth daily.     metFORMIN (GLUCOPHAGE) 500 MG tablet Take 1 tablet (500 mg total) by mouth 2 (two) times daily with a meal. 180 tablet 3   metoprolol tartrate (LOPRESSOR) 50 MG tablet Take 50 mg by mouth daily.     nitroGLYCERIN (NITROSTAT) 0.4 MG SL tablet Place 1 tablet (0.4 mg total) under the tongue every 5 (five) minutes as needed for chest pain. 25 tablet 3   Omega-3 Fatty Acids (FISH OIL) 1200 MG CAPS Take 2 capsules (2,400 mg total) by mouth every morning.     PRALUENT 150 MG/ML SOAJ INJECT 150 MG INTO THE SKIN EVERY 14 (FOURTEEN) DAYS. 2 mL 11   silver  sulfADIAZINE (SILVADENE) 1 % cream Apply 1 Application topically 2 (two) times daily. 50 g 0   No current facility-administered medications for this visit.    Review of Systems  Constitutional:  Constitutional negative. HENT: HENT negative.  Eyes: Eyes negative.  Respiratory: Respiratory negative.  Cardiovascular: Cardiovascular negative.  GI: Gastrointestinal negative.  Musculoskeletal: Musculoskeletal negative.  Skin: Skin negative.  Neurological: Neurological negative. Hematologic: Hematologic/lymphatic negative.  Psychiatric: Psychiatric negative.        Objective:  Objective  Vitals:   01/15/22 0919  BP: 119/73  Pulse: 66  Resp: 20  Temp: 97.7 F (36.5 C)  SpO2: 97%     Physical Exam HENT:     Head: Normocephalic.  Nose: Nose normal.  Eyes:     Pupils: Pupils are equal, round, and reactive to light.  Cardiovascular:     Pulses:          Femoral pulses are 0 on the right side and 0 on the left side.      Popliteal pulses are 0 on the right side and 0 on the left side.  Pulmonary:     Effort: Pulmonary effort is normal.  Abdominal:     General: Abdomen is flat.     Palpations: Abdomen is soft.  Neurological:     Mental Status: He is alert.     Data: ABI Findings:  +---------+------------------+-----+-------------------+--------+  Right    Rt Pressure (mmHg)IndexWaveform           Comment   +---------+------------------+-----+-------------------+--------+  Brachial 139                                                 +---------+------------------+-----+-------------------+--------+  PTA      89                0.64 monophasic                   +---------+------------------+-----+-------------------+--------+  PERO     71                0.51 monophasic                   +---------+------------------+-----+-------------------+--------+  DP       83                0.60 dampened monophasic           +---------+------------------+-----+-------------------+--------+  Great Toe58                0.42 Abnormal                     +---------+------------------+-----+-------------------+--------+   +---------+------------------+-----+-------------------+-------+  Left     Lt Pressure (mmHg)IndexWaveform           Comment  +---------+------------------+-----+-------------------+-------+  Brachial 139                                                +---------+------------------+-----+-------------------+-------+  PTA      50                0.36 dampened monophasic         +---------+------------------+-----+-------------------+-------+  PERO     59                0.42 dampened monophasic         +---------+------------------+-----+-------------------+-------+  DP       62                0.45 dampened monophasic         +---------+------------------+-----+-------------------+-------+  Great Toe                       Absent                      +---------+------------------+-----+-------------------+-------+   +-------+-----------+-----------+------------+------------+  ABI/TBIToday's ABIToday's TBIPrevious ABIPrevious TBI  +-------+-----------+-----------+------------+------------+  Right  0.64  0.42       [place]          0.62          +-------+-----------+-----------+------------+------------+  Left   0.45       0          0.60        0.26          +-------+-----------+-----------+------------+------------+   Abdominal Aorta Findings:  +-------------+-------+----------+----------+----------+--------+--------+  Location     AP (cm)Trans (cm)PSV (cm/s)Waveform  ThrombusComments  +-------------+-------+----------+----------+----------+--------+--------+  Proximal     2.60   2.50      72        biphasic                    +-------------+-------+----------+----------+----------+--------+--------+  Mid                            44        biphasic                    +-------------+-------+----------+----------+----------+--------+--------+  Distal                        35        biphasic                    +-------------+-------+----------+----------+----------+--------+--------+  RT CIA Prox                   58        biphasic                    +-------------+-------+----------+----------+----------+--------+--------+  RT CIA Mid                    63        biphasic                    +-------------+-------+----------+----------+----------+--------+--------+  RT CIA Distal                 62        biphasic                    +-------------+-------+----------+----------+----------+--------+--------+  RT EIA Prox                   92        biphasic                    +-------------+-------+----------+----------+----------+--------+--------+  RT EIA Mid                    97        monophasic                  +-------------+-------+----------+----------+----------+--------+--------+  RT EIA Distal                 82        biphasic                    +-------------+-------+----------+----------+----------+--------+--------+  LT CIA Prox                   73        biphasic                    +-------------+-------+----------+----------+----------+--------+--------+  LT  CIA Mid                    64        monophasic                  +-------------+-------+----------+----------+----------+--------+--------+  LT CIA Distal                 161       biphasic                    +-------------+-------+----------+----------+----------+--------+--------+  LT EIA Prox                   106       biphasic                    +-------------+-------+----------+----------+----------+--------+--------+  LT EIA Mid                    102       biphasic                     +-------------+-------+----------+----------+----------+--------+--------+  LT EIA Distal                 66        monophasic                  +-------------+-------+----------+----------+----------+--------+--------+   Unable to accurately visualize stent struts in the left common iliac  artery, therefore velocities are listed in the native table.   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  I cannot reliably feel femoral pulse today on exam possibly secondary to external iliac artery disease distal to the previously placed stent.  However due to his previous angiogram with him which demonstrated significant disease in the left SFA and would likely require bypass surgery to include left common femoral endarterectomy and bypass to the below-knee popliteal or tibioperoneal trunk based on previous angiography.  We will begin with angiography which can be ipsilateral from the left side on a Monday with plans for vein mapping after the procedure and bypass on Tuesday.  We will have this scheduled in the next 2 weeks given the urgency with ongoing wound.  We discussed his risk benefits alternatives including the risk of failure of the bypass and risk of limb loss despite adequate revascularization.  Patient has had harvest of vein for coronary artery bypass grafting in the past but unsure which vein was harvested.  All questions were answered today.     Maeola HarmanBrandon Christopher Denean Pavon MD [facility]

## 2022-01-15 NOTE — H&P (View-Only) (Signed)
Patient ID: [name], male   DOB: 06-04-1940, 81 y.o.   MRN: [id number]  Reason for Consult: New Patient (Initial Visit)   Referred by [name], PA-C  Subjective:     HPI:  [name] is a 81 y.o. male with history of claudication has undergone bilateral lower extremity endovascular intervention in the past.  Most recently has a left common iliac artery stent placed a year ago and had appear to have occluded left SFA at that time.  He now has 6-week history of wound on the left great toe for which he is being followed at the wound care center.  He was evaluated by Dr. Allyson Sabal yesterday and is now here for discussion of possible need for bypass surgery.  Past Medical History:  Diagnosis Date   Claudication (HCC) 06/27/2011   Left ABIs abnormal values with mild-moderate arterial insufficiency, right SFA moderate amount of mixed density plaque elevating velocities suggestive 50-69% diameter reduction   Coronary artery disease 06/27/2011   Normal Lexiscan, EF 64, post-stress EF 64, no EKG changes   Diabetes mellitus without complication (HCC)    prediabetic   Hyperlipidemia    Hypertension    Impotence of organic origin    Osteoarthritis    Other testicular hypofunction    PVD (peripheral vascular disease) (HCC)    Family History  Problem Relation Age of Onset   Hypertension Mother        65   Clotting disorder Father        20   Diabetes Brother        heart surgery   Past Surgical History:  Procedure Laterality Date   ABDOMINAL AORTOGRAM N/A 06/28/2020   Procedure: ABDOMINAL AORTOGRAM;  Surgeon: [name], MD;  Location: [facility];  Service: Cardiovascular;  Laterality: N/A;   CARDIAC CATHETERIZATION  12/26/2004   Left main-90% distal tapered stenosis, left circumflex-95% ostial stenosis, first marginal branch 99% ostial branch, posterior descending artery 99% ostial stenosis, 60-70% segmental mid stenosis, needed CABG   CORONARY ARTERY BYPASS GRAFT   12/27/2004   Left main 3-vessel disease, LIMA to LAD, saphenous vein to circumflexmarginal branch and to the PDA   INGUINAL HERNIA REPAIR Left 2005   LOWER EXTREMITY ANGIOGRAPHY N/A 04/13/2017   Procedure: LOWER EXTREMITY ANGIOGRAPHY;  Surgeon: [name], MD;  Location: [facility];  Service: Cardiovascular;  Laterality: N/A;   LOWER EXTREMITY ANGIOGRAPHY Bilateral 12/06/2020   Procedure: Lower Extremity Angiography;  Surgeon: [name], MD;  Location: [facility] INVAS[facility] CV LAB;  Service: Cardiovascular;  Laterality: Bilateral;  Limited Study   LOWER EXTREMITY INTERVENTION N/A 08/04/2016   Procedure: Lower Extremity Intervention;  Surgeon: [name], MD;  Location: [facility] INVASIVE CV LAB;  Service: Cardiovascular;  Laterality: N/A;   PERIPHERAL VASCULAR ATHERECTOMY Left 04/13/2017   Procedure: PERIPHERAL VASCULAR ATHERECTOMY;  Surgeon: [name], MD;  Location: [facility] INVASIVE CV LAB;  Service: Cardiovascular;  Laterality: Left;  SFA   PERIPHERAL VASCULAR ATHERECTOMY  12/06/2020   Procedure: PERIPHERAL VASCULAR ATHERECTOMY;  Surgeon: [name], MD;  Location: [facility] INVASIVE CV LAB;  Service: Cardiovascular;;  Lt Iliac   PERIPHERAL VASCULAR BALLOON ANGIOPLASTY Left 04/13/2017   Procedure: PERIPHERAL VASCULAR BALLOON ANGIOPLASTY;  Surgeon: [name], MD;  Location: [facility];  Service: Cardiovascular;  Laterality: Left;  SFA   PERIPHERAL VASCULAR INTERVENTION  12/06/2020   Procedure: PERIPHERAL VASCULAR INTERVENTION;  Surgeon: [name], MD;  Location: [facility] INVASIVE  CV LAB;  Service: Cardiovascular;;  Lt Iliac   RIGHT/LEFT HEART CATH AND CORONARY/GRAFT ANGIOGRAPHY N/A 06/28/2020   Procedure: RIGHT/LEFT HEART CATH AND CORONARY/GRAFT ANGIOGRAPHY;  Surgeon: [name], MD;  Location: [facility] INVAS[facility] CV [facility];  Service: Cardiovascular;  Laterality: N/A;    Short Social History:  Social History   Tobacco Use   Smoking status: Former    Packs/day: 1.00     Years: 50.00    Total pack years: 50.00    Types: Cigarettes    Quit date: 12/17/2004    Years since quitting: 17.0   Smokeless tobacco: Never  Substance Use Topics   Alcohol use: Yes    Comment: occasionally    Allergies  Allergen Reactions   Crestor [Rosuvastatin] Other (See Comments)    Myalgias   Lipitor [Atorvastatin]     myalgias   Lisinopril     cough    Current Outpatient Medications  Medication Sig Dispense Refill   amLODipine (NORVASC) 10 MG tablet TAKE 1 TABLET BY MOUTH EVERY DAY 90 tablet 3   aspirin 81 MG tablet Take 81 mg by mouth daily.     cephALEXin (KEFLEX) 500 MG capsule Take 1 capsule (500 mg total) by mouth 2 (two) times daily. 6 capsule 0   cilostazol (PLETAL) 50 MG tablet TAKE 1 TABLET BY MOUTH TWICE A DAY 180 tablet 3   clopidogrel (PLAVIX) 75 MG tablet TAKE 1 TABLET BY MOUTH EVERY DAY 90 tablet 3   Coenzyme Q10 (COQ10) 200 MG CAPS Take 200 mg by mouth daily.     famotidine (PEPCID) 20 MG tablet Take 1 tablet (20 mg total) by mouth daily. 90 tablet 1   fluticasone (FLONASE) 50 MCG/ACT nasal spray Place 2 sprays into both nostrils daily. 16 g 1   isosorbide mononitrate (IMDUR) 60 MG 24 hr tablet TAKE 1 TABLET BY MOUTH EVERY DAY 90 tablet 3   levocetirizine (XYZAL) 5 MG tablet Take 1 tablet (5 mg total) by mouth every evening. 90 tablet 1   levofloxacin (LEVAQUIN) 750 MG tablet Take 750 mg by mouth daily.     metFORMIN (GLUCOPHAGE) 500 MG tablet Take 1 tablet (500 mg total) by mouth 2 (two) times daily with a meal. 180 tablet 3   metoprolol tartrate (LOPRESSOR) 50 MG tablet Take 50 mg by mouth daily.     nitroGLYCERIN (NITROSTAT) 0.4 MG SL tablet Place 1 tablet (0.4 mg total) under the tongue every 5 (five) minutes as needed for chest pain. 25 tablet 3   Omega-3 Fatty Acids (FISH OIL) 1200 MG CAPS Take 2 capsules (2,400 mg total) by mouth every morning.     PRALUENT 150 MG/ML SOAJ INJECT 150 MG INTO THE SKIN EVERY 14 (FOURTEEN) DAYS. 2 mL 11   silver  sulfADIAZINE (SILVADENE) 1 % cream Apply 1 Application topically 2 (two) times daily. 50 g 0   No current facility-administered medications for this visit.    Review of Systems  Constitutional:  Constitutional negative. HENT: HENT negative.  Eyes: Eyes negative.  Respiratory: Respiratory negative.  Cardiovascular: Cardiovascular negative.  GI: Gastrointestinal negative.  Musculoskeletal: Musculoskeletal negative.  Skin: Skin negative.  Neurological: Neurological negative. Hematologic: Hematologic/lymphatic negative.  Psychiatric: Psychiatric negative.        Objective:  Objective  Vitals:   01/15/22 0919  BP: 119/73  Pulse: 66  Resp: 20  Temp: 97.7 F (36.5 C)  SpO2: 97%     Physical Exam HENT:     Head: Normocephalic.  Nose: Nose normal.  Eyes:     Pupils: Pupils are equal, round, and reactive to light.  Cardiovascular:     Pulses:          Femoral pulses are 0 on the right side and 0 on the left side.      Popliteal pulses are 0 on the right side and 0 on the left side.  Pulmonary:     Effort: Pulmonary effort is normal.  Abdominal:     General: Abdomen is flat.     Palpations: Abdomen is soft.  Neurological:     Mental Status: He is alert.     Data: ABI Findings:  +---------+------------------+-----+-------------------+--------+  Right    Rt Pressure (mmHg)IndexWaveform           Comment   +---------+------------------+-----+-------------------+--------+  Brachial 139                                                 +---------+------------------+-----+-------------------+--------+  PTA      89                0.64 monophasic                   +---------+------------------+-----+-------------------+--------+  PERO     71                0.51 monophasic                   +---------+------------------+-----+-------------------+--------+  DP       83                0.60 dampened monophasic           +---------+------------------+-----+-------------------+--------+  Great Toe58                0.42 Abnormal                     +---------+------------------+-----+-------------------+--------+   +---------+------------------+-----+-------------------+-------+  Left     Lt Pressure (mmHg)IndexWaveform           Comment  +---------+------------------+-----+-------------------+-------+  Brachial 139                                                +---------+------------------+-----+-------------------+-------+  PTA      50                0.36 dampened monophasic         +---------+------------------+-----+-------------------+-------+  PERO     59                0.42 dampened monophasic         +---------+------------------+-----+-------------------+-------+  DP       62                0.45 dampened monophasic         +---------+------------------+-----+-------------------+-------+  Great Toe                       Absent                      +---------+------------------+-----+-------------------+-------+   +-------+-----------+-----------+------------+------------+  ABI/TBIToday's ABIToday's TBIPrevious ABIPrevious TBI  +-------+-----------+-----------+------------+------------+  Right  0.64  0.42       [place]          0.62          +-------+-----------+-----------+------------+------------+  Left   0.45       0          0.60        0.26          +-------+-----------+-----------+------------+------------+   Abdominal Aorta Findings:  +-------------+-------+----------+----------+----------+--------+--------+  Location     AP (cm)Trans (cm)PSV (cm/s)Waveform  ThrombusComments  +-------------+-------+----------+----------+----------+--------+--------+  Proximal     2.60   2.50      72        biphasic                    +-------------+-------+----------+----------+----------+--------+--------+  Mid                            44        biphasic                    +-------------+-------+----------+----------+----------+--------+--------+  Distal                        35        biphasic                    +-------------+-------+----------+----------+----------+--------+--------+  RT CIA Prox                   58        biphasic                    +-------------+-------+----------+----------+----------+--------+--------+  RT CIA Mid                    63        biphasic                    +-------------+-------+----------+----------+----------+--------+--------+  RT CIA Distal                 62        biphasic                    +-------------+-------+----------+----------+----------+--------+--------+  RT EIA Prox                   92        biphasic                    +-------------+-------+----------+----------+----------+--------+--------+  RT EIA Mid                    97        monophasic                  +-------------+-------+----------+----------+----------+--------+--------+  RT EIA Distal                 82        biphasic                    +-------------+-------+----------+----------+----------+--------+--------+  LT CIA Prox                   73        biphasic                    +-------------+-------+----------+----------+----------+--------+--------+  LT  CIA Mid                    64        monophasic                  +-------------+-------+----------+----------+----------+--------+--------+  LT CIA Distal                 161       biphasic                    +-------------+-------+----------+----------+----------+--------+--------+  LT EIA Prox                   106       biphasic                    +-------------+-------+----------+----------+----------+--------+--------+  LT EIA Mid                    102       biphasic                     +-------------+-------+----------+----------+----------+--------+--------+  LT EIA Distal                 66        monophasic                  +-------------+-------+----------+----------+----------+--------+--------+   Unable to accurately visualize stent struts in the left common iliac  artery, therefore velocities are listed in the native table.   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  I cannot reliably feel femoral pulse today on exam possibly secondary to external iliac artery disease distal to the previously placed stent.  However due to his previous angiogram with him which demonstrated significant disease in the left SFA and would likely require bypass surgery to include left common femoral endarterectomy and bypass to the below-knee popliteal or tibioperoneal trunk based on previous angiography.  We will begin with angiography which can be ipsilateral from the left side on a Monday with plans for vein mapping after the procedure and bypass on Tuesday.  We will have this scheduled in the next 2 weeks given the urgency with ongoing wound.  We discussed his risk benefits alternatives including the risk of failure of the bypass and risk of limb loss despite adequate revascularization.  Patient has had harvest of vein for coronary artery bypass grafting in the past but unsure which vein was harvested.  All questions were answered today.     Niklas Chretien Christopher Jowel Waltner MD [facility]   

## 2022-01-16 ENCOUNTER — Encounter: Payer: Self-pay | Admitting: Medical

## 2022-01-17 ENCOUNTER — Other Ambulatory Visit: Payer: Self-pay | Admitting: Medical

## 2022-01-17 MED ORDER — METOPROLOL TARTRATE 50 MG PO TABS: 50.0000 mg | ORAL_TABLET | Freq: Every day | ORAL | 0 refills | Status: DC

## 2022-01-17 NOTE — Progress Notes (Signed)
[name] ([id number]) Visit Report for 01/15/2022 Chief Complaint Document Details Patient Name: Date of Service: [name] [facility] RD 01/15/2022 10:00 A M Medical Record Number: [id number] Patient Account Number: [id number] Date of Birth/Sex: Treating RN: 10-28-1940 (81 y.o. M) Primary Care Provider: [name] Other Clinician: Referring Provider: Treating Provider/Extender: [name] in Treatment: 2 Information Obtained from: Patient Chief Complaint Left 1st and 2nd toe ulcers Electronic Signature(s) Signed: 01/15/2022 10:50:04 AM By: Lenda Kelp PA-C Entered By: Lenda Kelp on 01/15/2022 10:50:04 -------------------------------------------------------------------------------- HPI Details Patient Name: Date of Service: [name] LFO RD 01/15/2022 10:00 A M Medical Record Number: [id number] Patient Account Number: [id number] Date of Birth/Sex: Treating RN: [birth year] (81 y.o. M) Primary Care Provider: [name] Other Clinician: Referring Provider: Treating Provider/Extender: [name] in Treatment: 2 History of Present Illness HPI Description: 01-01-2022 upon evaluation today patient appears to be doing poorly in regard to her wound that is on the left great toe and second toe location. The toenail of the second toe covers over a portion of the wound which I am concerned may be hiding a piece of exposed bone based on what I see today. The patient does have a history of peripheral vascular disease he is a patient of Dr. Gery Pray who did an abdominal aortogram previous and was unable to intervene with regard to the lower extremity blood flow. The patient had a right ABI which was noncompressible and a left ABI was 0.6 with TBI's of 0.26 unfortunately. The TBI on the right was 0.62 which was not nearly as bad. Nonetheless the unfortunate thing is that with poor blood flow and potential for bone exposure this becomes  increasingly complicated if the patient does have osteomyelitis to get this to heal. He does have repeat vascular testing on Monday upcoming. The patient is stated to be "prediabetic" according to notes and the patient. Currently he has been on Keflex. Patient has a history of peripheral vascular disease, prediabetes, hypertension, and unfortunately the current wound which has been present for 4 weeks based on what they tell me today. 01-08-2022 upon evaluation today patient appears to be doing about the same in regard to his wound. Again the x-ray was negative that was performed on 17th everything looks to be okay. With that being said he is continuing to have issues here with poor blood flow and perfusion he did have a repeat arterial study and ABI with TBI TBI could not even be obtained because the signals were so dampened. Previously he had been at 0.2. In regard to the ABI was previously around 0.65 he was now around 0.45. Again this was definitely a decrease on the left side compared to previous the right was stable. He is stated to have critical limb ischemia. 01-15-2022 upon evaluation today patient appears to be doing well currently in regard to his wounds all things considered. Were seeing more growth on the big toe versus the second toe but this is to be expected at the second toe does have some bone exposed. Fortunately I do not see any evidence of active infection at this time. He does have an appointment scheduled for September 11 which is Monday for an arteriogram and then subsequently the 12th for bypass surgery. Subsequently I think this is can be very good for him as far as trying to get this healed I believe it is good to be the best plan that we have  as far as getting good blood flow to allow for healing to continue. Once we get good blood flow I think we can then clean away some of the necrotic bone if necessary to try and allow this to heal more effectively. We also discussed  briefly hyperbaric oxygen therapy and the possibility of distal toe amputation if need be but again right now we are still on the plantar stages of even knowing what we can do from a wound care perspective which I think is the first initial goal here. Electronic Signature(s) Signed: 01/15/2022 11:23:57 AM By: Lenda Kelp PA-C Signed: 01/15/2022 11:23:57 AM By: Lenda Kelp PA-C Entered By: Lenda Kelp on 01/15/2022 11:23:57 -------------------------------------------------------------------------------- Physical Exam Details Patient Name: Date of Service: [name], A LFO RD 01/15/2022 10:00 A M Medical Record Number: [id number] Patient Account Number: [id number] Date of Birth/Sex: Treating RN: 11-Aug-1940 (81 y.o. M) Primary Care Provider: [name] Other Clinician: Referring Provider: Treating Provider/Extender: [name] in Treatment: 2 Constitutional Well-nourished and well-hydrated in no acute distress. Respiratory normal breathing without difficulty. Psychiatric this patient is able to make decisions and demonstrates good insight into disease process. Alert and Oriented x 3. pleasant and cooperative. Notes Upon inspection patient's wound bed actually showed signs of doing better in regard to the great toe the second toe is maintaining at least I think we are on the right track here. Fortunately I see no signs of infection at this time. Electronic Signature(s) Signed: 01/15/2022 11:24:14 AM By: Lenda Kelp PA-C Entered By: Lenda Kelp on 01/15/2022 11:24:14 -------------------------------------------------------------------------------- Physician Orders Details Patient Name: Date of Service: [name], A LFO RD 01/15/2022 10:00 A M Medical Record Number: [id number] Patient Account Number: [id number] Date of Birth/Sex: Treating RN: [birth year] (81 y.o. [name] Primary Care Provider: [name] Other  Clinician: Referring Provider: Treating Provider/Extender: [name] in Treatment: 2 Verbal / Phone Orders: No Diagnosis Coding ICD-10 Coding Code Description I73.89 Other specified peripheral vascular diseases L97.522 Non-pressure chronic ulcer of other part of left foot with fat layer exposed R73.03 Prediabetes I10 Essential (primary) hypertension Follow-up Appointments ppointment in 1 week. - Wednesday w/ Allen Derry and Maryruth Bun, [place] # 9 Return A Bathing/ Shower/ Hygiene May shower with protection but do not get wound dressing(s) wet. Edema Control - Lymphedema / SCD / Other Elevate legs to the level of the heart or above for 30 minutes daily and/or when sitting, a frequency of: Avoid standing for long periods of time. Off-Loading Other: - Keep pressure off of Left Foot/toes Wound Treatment Wound #1 - T Great oe Wound Laterality: Left Cleanser: Soap and Water 1 x Per Day/15 Days Discharge Instructions: May shower and wash wound with dial antibacterial soap and water prior to dressing change. Cleanser: Wound Cleanser (Generic) 1 x Per Day/15 Days Discharge Instructions: Cleanse the wound with wound cleanser prior to applying a clean dressing using gauze sponges, not tissue or cotton balls. Prim Dressing: Xeroform Occlusive Gauze Dressing, 4x4 in (Generic) 1 x Per Day/15 Days ary Discharge Instructions: Apply to wound bed as instructed Secondary Dressing: Woven Gauze Sponge, Non-Sterile 4x4 in (Generic) 1 x Per Day/15 Days Discharge Instructions: Apply over primary dressing as directed. Secured With: Insurance underwriter, Sterile 2x75 (in/in) (Generic) 1 x Per Day/15 Days Discharge Instructions: Secure with stretch gauze as directed. Secured With: 21M Medipore H Soft Cloth Surgical T ape, 4 x 10 (in/yd) (Generic) 1 x  Per Day/15 Days Discharge Instructions: Secure with tape as directed. Secured With: [employer] Size 2, 10 (yds) (Generic) 1 x  Per Day/15 Days Wound #2 - T Second oe Wound Laterality: Left Cleanser: Soap and Water 1 x Per Day/15 Days Discharge Instructions: May shower and wash wound with dial antibacterial soap and water prior to dressing change. Cleanser: Wound Cleanser (Generic) 1 x Per Day/15 Days Discharge Instructions: Cleanse the wound with wound cleanser prior to applying a clean dressing using gauze sponges, not tissue or cotton balls. Prim Dressing: Xeroform Occlusive Gauze Dressing, 4x4 in (Generic) 1 x Per Day/15 Days ary Discharge Instructions: Apply to wound bed as instructed Secondary Dressing: Woven Gauze Sponge, Non-Sterile 4x4 in (Generic) 1 x Per Day/15 Days Discharge Instructions: Apply over primary dressing as directed. Secured With: Insurance underwriter, Sterile 2x75 (in/in) (Generic) 1 x Per Day/15 Days Discharge Instructions: Secure with stretch gauze as directed. Secured With: 32M Medipore H Soft Cloth Surgical T ape, 4 x 10 (in/yd) (Generic) 1 x Per Day/15 Days Discharge Instructions: Secure with tape as directed. Secured With: [employer] Size 2, 10 (yds) (Generic) 1 x Per Day/15 Days Electronic Signature(s) Signed: 01/15/2022 6:03:16 PM By: Lenda Kelp PA-C Signed: 01/17/2022 1:10:23 PM By: [name] RN Entered By: [name] on 01/15/2022 10:52:12 -------------------------------------------------------------------------------- Problem List Details Patient Name: Date of Service: [name], A LFO RD 01/15/2022 10:00 A M Medical Record Number: [id number] Patient Account Number: [id number] Date of Birth/Sex: Treating RN: 10/09/1940 (81 y.o. M) Primary Care Provider: [name] Other Clinician: Referring Provider: Treating Provider/Extender: [name] in Treatment: 2 Active Problems ICD-10 Encounter Code Description Active Date MDM Diagnosis I73.89 Other specified peripheral vascular diseases 01/01/2022 No Yes L97.522  Non-pressure chronic ulcer of other part of left foot with fat layer exposed 01/01/2022 No Yes R73.03 Prediabetes 01/01/2022 No Yes I10 Essential (primary) hypertension 01/01/2022 No Yes Inactive Problems Resolved Problems Electronic Signature(s) Signed: 01/15/2022 10:49:59 AM By: Lenda Kelp PA-C Entered By: Lenda Kelp on 01/15/2022 10:49:59 -------------------------------------------------------------------------------- Progress Note Details Patient Name: Date of Service: [name], A LFO RD 01/15/2022 10:00 A M Medical Record Number: [id number] Patient Account Number: [id number] Date of Birth/Sex: Treating RN: [birth year] (81 y.o. M) Primary Care Provider: [name] Other Clinician: Referring Provider: Treating Provider/Extender: [name] in Treatment: 2 Subjective Chief Complaint Information obtained from Patient Left 1st and 2nd toe ulcers History of Present Illness (HPI) 01-01-2022 upon evaluation today patient appears to be doing poorly in regard to her wound that is on the left great toe and second toe location. The toenail of the second toe covers over a portion of the wound which I am concerned may be hiding a piece of exposed bone based on what I see today. The patient does have a history of peripheral vascular disease he is a patient of Dr. Gery Pray who did an abdominal aortogram previous and was unable to intervene with regard to the lower extremity blood flow. The patient had a right ABI which was noncompressible and a left ABI was 0.6 with TBI's of 0.26 unfortunately. The TBI on the right was 0.62 which was not nearly as bad. Nonetheless the unfortunate thing is that with poor blood flow and potential for bone exposure this becomes increasingly complicated if the patient does have osteomyelitis to get this to heal. He does have repeat vascular testing on Monday upcoming. The patient is stated to be "prediabetic"  according to notes and the  patient. Currently he has been on Keflex. Patient has a history of peripheral vascular disease, prediabetes, hypertension, and unfortunately the current wound which has been present for 4 weeks based on what they tell me today. 01-08-2022 upon evaluation today patient appears to be doing about the same in regard to his wound. Again the x-ray was negative that was performed on 17th everything looks to be okay. With that being said he is continuing to have issues here with poor blood flow and perfusion he did have a repeat arterial study and ABI with TBI TBI could not even be obtained because the signals were so dampened. Previously he had been at 0.2. In regard to the ABI was previously around 0.65 he was now around 0.45. Again this was definitely a decrease on the left side compared to previous the right was stable. He is stated to have critical limb ischemia. 01-15-2022 upon evaluation today patient appears to be doing well currently in regard to his wounds all things considered. Were seeing more growth on the big toe versus the second toe but this is to be expected at the second toe does have some bone exposed. Fortunately I do not see any evidence of active infection at this time. He does have an appointment scheduled for September 11 which is Monday for an arteriogram and then subsequently the 12th for bypass surgery. Subsequently I think this is can be very good for him as far as trying to get this healed I believe it is good to be the best plan that we have as far as getting good blood flow to allow for healing to continue. Once we get good blood flow I think we can then clean away some of the necrotic bone if necessary to try and allow this to heal more effectively. We also discussed briefly hyperbaric oxygen therapy and the possibility of distal toe amputation if need be but again right now we are still on the plantar stages of even knowing what we can do from a wound care perspective which I  think is the first initial goal here. Objective Constitutional Well-nourished and well-hydrated in no acute distress. Vitals Time Taken: 10:38 AM, Temperature: 97.6 F, Pulse: 66 bpm, Respiratory Rate: 16 breaths/min, Blood Pressure: 116/66 mmHg. Respiratory normal breathing without difficulty. Psychiatric this patient is able to make decisions and demonstrates good insight into disease process. Alert and Oriented x 3. pleasant and cooperative. General Notes: Upon inspection patient's wound bed actually showed signs of doing better in regard to the great toe the second toe is maintaining at least I think we are on the right track here. Fortunately I see no signs of infection at this time. Integumentary (Hair, Skin) Wound #1 status is Open. Original cause of wound was Trauma. The date acquired was: 12/01/2021. The wound has been in treatment 2 weeks. The wound is located on the Left T Great. The wound measures 4.9cm length x 1.5cm width x 0.2cm depth; 5.773cm^2 area and 1.155cm^3 volume. There is Fat Layer oe (Subcutaneous Tissue) exposed. There is a medium amount of serosanguineous drainage noted. The wound margin is distinct with the outline attached to the wound base. There is medium (34-66%) red, pink granulation within the wound bed. There is a medium (34-66%) amount of necrotic tissue within the wound bed. Wound #2 status is Open. Original cause of wound was Trauma. The date acquired was: 12/01/2021. The wound has been in treatment 2 weeks. The wound is located  on the Left T Second. The wound measures 0.4cm length x 0.9cm width x 0.1cm depth; 0.283cm^2 area and 0.028cm^3 volume. There is Fat Layer oe (Subcutaneous Tissue) exposed. There is a medium amount of serosanguineous drainage noted. The wound margin is distinct with the outline attached to the wound base. There is medium (34-66%) red, pink granulation within the wound bed. There is a medium (34-66%) amount of necrotic tissue within  the wound bed. Assessment Active Problems ICD-10 Other specified peripheral vascular diseases Non-pressure chronic ulcer of other part of left foot with fat layer exposed Prediabetes Essential (primary) hypertension Plan Follow-up Appointments: Return Appointment in 1 week. - Wednesday w/ Allen Derry and Maryruth Bun, [place] # 9 Bathing/ Shower/ Hygiene: May shower with protection but do not get wound dressing(s) wet. Edema Control - Lymphedema / SCD / Other: Elevate legs to the level of the heart or above for 30 minutes daily and/or when sitting, a frequency of: Avoid standing for long periods of time. Off-Loading: Other: - Keep pressure off of Left Foot/toes WOUND #1: - T Great Wound Laterality: Left oe Cleanser: Soap and Water 1 x Per Day/15 Days Discharge Instructions: May shower and wash wound with dial antibacterial soap and water prior to dressing change. Cleanser: Wound Cleanser (Generic) 1 x Per Day/15 Days Discharge Instructions: Cleanse the wound with wound cleanser prior to applying a clean dressing using gauze sponges, not tissue or cotton balls. Prim Dressing: Xeroform Occlusive Gauze Dressing, 4x4 in (Generic) 1 x Per Day/15 Days ary Discharge Instructions: Apply to wound bed as instructed Secondary Dressing: Woven Gauze Sponge, Non-Sterile 4x4 in (Generic) 1 x Per Day/15 Days Discharge Instructions: Apply over primary dressing as directed. Secured With: Insurance underwriter, Sterile 2x75 (in/in) (Generic) 1 x Per Day/15 Days Discharge Instructions: Secure with stretch gauze as directed. Secured With: 49M Medipore H Soft Cloth Surgical T ape, 4 x 10 (in/yd) (Generic) 1 x Per Day/15 Days Discharge Instructions: Secure with tape as directed. Secured With: [employer] Size 2, 10 (yds) (Generic) 1 x Per Day/15 Days WOUND #2: - T Second Wound Laterality: Left oe Cleanser: Soap and Water 1 x Per Day/15 Days Discharge Instructions: May shower and wash wound with dial  antibacterial soap and water prior to dressing change. Cleanser: Wound Cleanser (Generic) 1 x Per Day/15 Days Discharge Instructions: Cleanse the wound with wound cleanser prior to applying a clean dressing using gauze sponges, not tissue or cotton balls. Prim Dressing: Xeroform Occlusive Gauze Dressing, 4x4 in (Generic) 1 x Per Day/15 Days ary Discharge Instructions: Apply to wound bed as instructed Secondary Dressing: Woven Gauze Sponge, Non-Sterile 4x4 in (Generic) 1 x Per Day/15 Days Discharge Instructions: Apply over primary dressing as directed. Secured With: Insurance underwriter, Sterile 2x75 (in/in) (Generic) 1 x Per Day/15 Days Discharge Instructions: Secure with stretch gauze as directed. Secured With: 49M Medipore H Soft Cloth Surgical T ape, 4 x 10 (in/yd) (Generic) 1 x Per Day/15 Days Discharge Instructions: Secure with tape as directed. Secured With: Stretch Net Size 2, 10 (yds) (Generic) 1 x Per Day/15 Days 1. I am good recommend that we currently going continue with the wound care measures as before and the patient is in agreement with plan. This includes the use of the Xeroform gauze dressing to both locations I think this is still doing well. 2. Also can recommend that we have the patient continue with the roll gauze dressing to cover. 3. I am also going to suggest the patient should  continue with a stretch net to hold in place. 3. I am also can recommend that he continue with the Levaquin which is doing well. I gave him this medication to continue for the time being for an additional 30 days starting on 16 August with 1 refill he still has that going at this point which is definitely good news. We will see patient back for reevaluation in 1 week here in the clinic. If anything worsens or changes patient will contact our office for additional recommendations. Electronic Signature(s) Signed: 01/15/2022 11:25:05 AM By: Lenda KelpStone III, Bethanee Redondo PA-C Entered By: Lenda KelpStone III, Lum Stillinger  on 01/15/2022 11:25:05 -------------------------------------------------------------------------------- SuperBill Details Patient Name: Date of Service: Richard BucyWO O D, A LFO RD 01/15/2022 Medical Record Number: [id number] Patient Account Number: [id number] Date of Birth/Sex: Treating RN: 10/27/1940 (81 y.o. Charlean MerlM) Breedlove, [name] Primary Care Provider: Esperanza RichtersSaguier, Edward Other Clinician: Referring Provider: Treating Provider/Extender: Selinda MichaelsStone III, Yoshua [facility] Saguier, Edward Weeks in Treatment: 2 Diagnosis Coding ICD-10 Codes Code Description [phone] 8947I73.89 Other specified peripheral vascular diseases L97.522 Non-pressure chronic ulcer of other part of left foot with fat layer exposed R73.03 Prediabetes I10 Essential (primary) hypertension Facility Procedures CPT4 Code: [id number] Description: 99214 - WOUND CARE VISIT-LEV 4 EST PT Modifier: Quantity: 1 Physician Procedures : CPT4 Code Description Modifier [id number] 99213 - WC PHYS LEVEL 3 - EST PT ICD-10 Diagnosis Description I73.89 Other specified peripheral vascular diseases L97.522 Non-pressure chronic ulcer of other part of left foot with fat layer exposed R73.03  Prediabetes I10 Essential (primary) hypertension Quantity: 1 Electronic Signature(s) Signed: 01/15/2022 11:25:18 AM By: Lenda KelpStone III, Renley Banwart PA-C Entered By: Lenda KelpStone III, Rashidi Loh on 01/15/2022 11:25:17

## 2022-01-17 NOTE — Progress Notes (Signed)
CAILLOU, MINUS ([id number]) Visit Report for 01/15/2022 Arrival Information Details Patient Name: Date of Service: Evelina Bucy [facility] RD 01/15/2022 10:00 A M Medical Record Number: [id number] Patient Account Number: [id number] Date of Birth/Sex: Treating RN: 1941-04-11 (81 y.o. M) Primary Care Audyn Dimercurio: [name] Other Clinician: Referring Yessenia Maillet: Treating Lacinda Curvin/Extender: [name] in Treatment: 2 Visit Information History Since Last Visit Added or deleted any medications: No Patient Arrived: Ambulatory Any new allergies or adverse reactions: No Arrival Time: 10:35 Had a fall or experienced change in No Accompanied By: wife activities of daily living that may affect Transfer Assistance: None risk of falls: Patient Identification Verified: Yes Signs or symptoms of abuse/neglect since last visito No Secondary Verification Process Completed: Yes Hospitalized since last visit: No Patient Requires Transmission-Based Precautions: No Implantable device outside of the clinic excluding No Patient Has Alerts: Yes cellular tissue based products placed in the center Patient Alerts: ABI's:02/23 R: N/C L:0.6 since last visit: TBI's:02/23 R:0.62L:0.26 Has Dressing in Place as Prescribed: Yes Pain Present Now: No Electronic Signature(s) Signed: 01/15/2022 4:51:17 PM By: Thayer Dallas Entered By: Thayer Dallas on 01/15/2022 10:36:30 -------------------------------------------------------------------------------- Clinic Level of Care Assessment Details Patient Name: Date of Service: Evelina Bucy LFO RD 01/15/2022 10:00 A M Medical Record Number: [id number] Patient Account Number: [id number] Date of Birth/Sex: Treating RN: September 13, 1940 (81 y.o. [name], [name] Primary Care Surina Storts: [name] Other Clinician: Referring Leatha Rohner: Treating Dandy Lazaro/Extender: [name] in Treatment: 2 Clinic Level of Care Assessment Items TOOL  4 Quantity Score X- 1 0 Use when only an EandM is performed on FOLLOW-UP visit ASSESSMENTS - Nursing Assessment / Reassessment X- 1 10 Reassessment of Co-morbidities (includes updates in patient status) X- 1 5 Reassessment of Adherence to Treatment Plan ASSESSMENTS - Wound and Skin A ssessment / Reassessment []  - 0 Simple Wound Assessment / Reassessment - one wound X- 2 5 Complex Wound Assessment / Reassessment - multiple wounds []  - 0 Dermatologic / Skin Assessment (not related to wound area) ASSESSMENTS - Focused Assessment X- 1 5 Circumferential Edema Measurements - multi extremities []  - 0 Nutritional Assessment / Counseling / Intervention []  - 0 Lower Extremity Assessment (monofilament, tuning fork, pulses) []  - 0 Peripheral Arterial Disease Assessment (using hand held doppler) ASSESSMENTS - Ostomy and/or Continence Assessment and Care []  - 0 Incontinence Assessment and Management []  - 0 Ostomy Care Assessment and Management (repouching, etc.) PROCESS - Coordination of Care X - Simple Patient / Family Education for ongoing care 1 15 []  - 0 Complex (extensive) Patient / Family Education for ongoing care X- 1 10 Staff obtains , Records, T Results / Process Orders est []  - 0 Staff telephones HHA, Nursing Homes / Clarify orders / etc []  - 0 Routine Transfer to another Facility (non-emergent condition) []  - 0 Routine Hospital Admission (non-emergent condition) []  - 0 New Admissions / / Ordering NPWT Apligraf, etc. , []  - 0 Emergency Hospital Admission (emergent condition) X- 1 10 Simple Discharge Coordination []  - 0 Complex (extensive) Discharge Coordination PROCESS - Special Needs []  - 0 Pediatric / Minor Patient Management []  - 0 Isolation Patient Management []  - 0 Hearing / Language / Visual special needs []  - 0 Assessment of Community assistance (transportation, D/C planning, etc.) []  - 0 Additional assistance / Altered  mentation []  - 0 Support Surface(s) Assessment (bed, cushion, seat, etc.) INTERVENTIONS - Wound Cleansing / Measurement []  - 0 Simple Wound Cleansing - one wound  X- 2 5 Complex Wound Cleansing - multiple wounds X- 1 5 Wound Imaging (photographs - any number of wounds) []  - 0 Wound Tracing (instead of photographs) []  - 0 Simple Wound Measurement - one wound X- 2 5 Complex Wound Measurement - multiple wounds INTERVENTIONS - Wound Dressings []  - 0 Small Wound Dressing one or multiple wounds X- 2 15 Medium Wound Dressing one or multiple wounds []  - 0 Large Wound Dressing one or multiple wounds X- 1 5 Application of Medications - topical []  - 0 Application of Medications - injection INTERVENTIONS - Miscellaneous []  - 0 External ear exam []  - 0 Specimen Collection (cultures, biopsies, blood, body fluids, etc.) []  - 0 Specimen(s) / Culture(s) sent or taken to Lab for analysis []  - 0 Patient Transfer (multiple staff / / Similar devices) []  - 0 Simple Staple / Suture removal (25 or less) []  - 0 Complex Staple / Suture removal (26 or more) []  - 0 Hypo / Hyperglycemic Management (close monitor of Blood Glucose) []  - 0 Ankle / Brachial Index (ABI) - do not check if billed separately X- 1 5 Vital Signs Has the patient been seen at the hospital within the last three years: Yes Total Score: 130 Level Of Care: New/Established - Level 4 Electronic Signature(s) Signed: 01/17/2022 1:10:23 PM By: RN Entered By: on 01/15/2022 10:39:00 -------------------------------------------------------------------------------- Encounter Discharge Information Details Patient Name: Date of Service: , A LFO RD 01/15/2022 10:00 A M Medical Record Number: Patient Account Number: Date of Birth/Sex: Treating RN: 1940/07/17 (81 y.o. , [name] Primary Care Alyssandra Hulsebus: Other Clinician: Referring  Anzleigh Slaven: Treating Ardean Simonich/Extender: in Treatment: 2 Encounter Discharge Information Items Discharge Condition: Stable Ambulatory Status: Ambulatory Discharge Destination: Home Transportation: Private Auto Accompanied By: wife Schedule Follow-up Appointment: Yes Clinical Summary of Care: Patient Declined Electronic Signature(s) Signed: 01/17/2022 1:10:23 PM By: [name] RN Entered By: [name] on 01/15/2022 10:53:52 -------------------------------------------------------------------------------- Lower Extremity Assessment Details Patient Name: Date of Service: Violet Baldy, A LFO RD 01/15/2022 10:00 A M Medical Record Number: [id number] Patient Account Number: [id number] Date of Birth/Sex: Treating RN: [birth year] (81 y.o. M) Primary Care Tamotsu Wiederholt: [name] Other Clinician: Referring Fina Heizer: Treating Michelena Culmer/Extender: [name] in Treatment: 2 Edema Assessment Assessed: [Left: No] [Right: No] Edema: [Left: Ye] [Right: s] Calf Left: Right: Point of Measurement: 37 cm From Medial Instep 37.5 cm Ankle Left: Right: Point of Measurement: 10 cm From Medial Instep 23.5 cm Electronic Signature(s) Signed: 01/15/2022 4:51:17 PM By: 03/19/2022 Entered By: [name] on 01/15/2022 10:44:23 -------------------------------------------------------------------------------- Multi-Disciplinary Care Plan Details Patient Name: Date of Service: 01/17/2022, A LFO RD 01/15/2022 10:00 A M Medical Record Number: 01/17/2022 Patient Account Number: [id number] Date of Birth/Sex: Treating RN: 09-12-1940 (81 y.o. 76 Primary Care Octave Montrose: [name] Other Clinician: Referring Lekesha Claw: Treating Sriram Febles/Extender: [name] in Treatment: 2 Active Inactive Tissue Oxygenation Nursing Diagnoses: Actual ineffective tissue perfusion; peripheral (select once  diagnosis is confirmed) Knowledge deficit related to disease process and management Potential alteration in peripheral tissue perfusion (select prior to confirmation of diagnosis) Goals: Invasive arterial studies completed as ordered Date Initiated: 01/01/2022 Target Resolution Date: 01/25/2022 Goal Status: Active Non-invasive arterial studies are completed as ordered Date Initiated: 01/01/2022 Target Resolution Date: 01/24/2022 Goal Status: Active Patient/caregiver will verbalize understanding of disease process and disease management Date Initiated: 01/01/2022 Target Resolution Date:  01/25/2022 Goal Status: Active Interventions: Assess patient understanding of disease process and management upon diagnosis and as needed Assess peripheral arterial status upon admission and as needed Provide education on tissue oxygenation and ischemia Notes: Wound/Skin Impairment Nursing Diagnoses: Impaired tissue integrity Knowledge deficit related to ulceration/compromised skin integrity Goals: Patient will have a decrease in wound volume by X% from date: (specify in notes) Date Initiated: 01/01/2022 Target Resolution Date: 01/23/2022 Goal Status: Active Patient/caregiver will verbalize understanding of skin care regimen Date Initiated: 01/01/2022 Target Resolution Date: 01/24/2022 Goal Status: Active Ulcer/skin breakdown will have a volume reduction of 30% by week 4 Date Initiated: 01/01/2022 Target Resolution Date: 01/25/2022 Goal Status: Active Interventions: Assess patient/caregiver ability to obtain necessary supplies Assess patient/caregiver ability to perform ulcer/skin care regimen upon admission and as needed Assess ulceration(s) every visit Notes: Electronic Signature(s) Signed: 01/17/2022 1:10:23 PM By: [name] RN Entered By: [name] on 01/15/2022 10:52:47 -------------------------------------------------------------------------------- Pain Assessment Details Patient  Name: Date of Service: Violet Baldy, A LFO RD 01/15/2022 10:00 A M Medical Record Number: [id number] Patient Account Number: [id number] Date of Birth/Sex: Treating RN: 12-29-1940 (81 y.o. M) Primary Care Abryanna Musolino: [name] Other Clinician: Referring Elecia Serafin: Treating Hasel Janish/Extender: [name] in Treatment: 2 Active Problems Location of Pain Severity and Description of Pain Patient Has Paino No Site Locations Pain Management and Medication Current Pain Management: Electronic Signature(s) Signed: 01/15/2022 4:51:17 PM By: Thayer Dallas Entered By: Thayer Dallas on 01/15/2022 10:38:30 -------------------------------------------------------------------------------- Patient/Caregiver Education Details Patient Name: Date of Service: Violet Baldy, A LFO RD 8/30/2023andnbsp10:00 A M Medical Record Number: [id number] Patient Account Number: [id number] Date of Birth/Gender: Treating RN: Nov 15, 1940 (81 y.o. [name] Primary Care Physician: [name] Other Clinician: Referring Physician: Treating Physician/Extender: [name] in Treatment: 2 Education Assessment Education Provided To: Patient Education Topics Provided Tissue Oxygenation: Methods: Explain/Verbal Responses: Reinforcements needed, State content correctly Electronic Signature(s) Signed: 01/17/2022 1:10:23 PM By: [name] RN Entered By: [name] on 01/15/2022 10:53:14 -------------------------------------------------------------------------------- Wound Assessment Details Patient Name: Date of Service: Violet Baldy, A LFO RD 01/15/2022 10:00 A M Medical Record Number: [id number] Patient Account Number: [id number] Date of Birth/Sex: Treating RN: 1940/11/27 (81 y.o. M) Primary Care Mindel Friscia: [name] Other Clinician: Referring Atziri Zubiate: Treating Savahanna Almendariz/Extender: [name] in Treatment: 2 Wound  Status Wound Number: 1 Primary Arterial Insufficiency Ulcer Etiology: Wound Location: Left T Great oe Wound Open Wounding Event: Trauma Status: Date Acquired: 12/01/2021 Comorbid Coronary Artery Disease, Hypotension, Peripheral Arterial Weeks Of Treatment: 2 History: Disease, Peripheral Venous Disease, Type II Diabetes, Clustered Wound: No Osteoarthritis Photos Wound Measurements Length: (cm) 4.9 Width: (cm) 1.5 Depth: (cm) 0.2 Area: (cm) 5.773 Volume: (cm) 1.155 % Reduction in Area: 41.2% % Reduction in Volume: 41.2% Epithelialization: Small (1-33%) Wound Description Classification: Full Thickness With Exposed Support Structures Wound Margin: Distinct, outline attached Exudate Amount: Medium Exudate Type: Serosanguineous Exudate Color: red, brown Foul Odor After Cleansing: No Slough/Fibrino Yes Wound Bed Granulation Amount: Medium (34-66%) Exposed Structure Granulation Quality: Red, Pink Fascia Exposed: No Necrotic Amount: Medium (34-66%) Fat Layer (Subcutaneous Tissue) Exposed: Yes Tendon Exposed: No Muscle Exposed: No Joint Exposed: No Bone Exposed: No Treatment Notes Wound #1 (Toe Great) Wound Laterality: Left Cleanser Soap and Water Discharge Instruction: May shower and wash wound with dial antibacterial soap and water prior to dressing change. Wound Cleanser Discharge Instruction: Cleanse the wound with wound cleanser prior to applying a clean dressing using gauze sponges,  not tissue or cotton balls. Peri-Wound Care Topical Primary Dressing Xeroform Occlusive Gauze Dressing, 4x4 in Discharge Instruction: Apply to wound bed as instructed Secondary Dressing Woven Gauze Sponge, Non-Sterile 4x4 in Discharge Instruction: Apply over primary dressing as directed. Secured With Conforming Stretch Gauze Bandage, Sterile 2x75 (in/in) Discharge Instruction: Secure with stretch gauze as directed. 35M Medipore H Soft Cloth Surgical T ape, 4 x 10 (in/yd) Discharge  Instruction: Secure with tape as directed. Stretch Net Size 2, 10 (yds) Compression Wrap Compression Stockings Add-Ons Electronic Signature(s) Signed: 01/17/2022 1:10:23 PM By: [name] RN Entered By: [name] on 01/15/2022 10:47:53 -------------------------------------------------------------------------------- Wound Assessment Details Patient Name: Date of Service: Violet Baldy, A LFO RD 01/15/2022 10:00 A M Medical Record Number: [id number] Patient Account Number: [id number] Date of Birth/Sex: Treating RN: Jun 07, 1940 (81 y.o. M) Primary Care Meliton Samad: [name] Other Clinician: Referring Alassane Kalafut: Treating Adam Demary/Extender: [name] in Treatment: 2 Wound Status Wound Number: 2 Primary Arterial Insufficiency Ulcer Etiology: Wound Location: Left T Second oe Wound Open Wounding Event: Trauma Status: Date Acquired: 12/01/2021 Comorbid Coronary Artery Disease, Hypotension, Peripheral Arterial Weeks Of Treatment: 2 History: Disease, Peripheral Venous Disease, Type II Diabetes, Clustered Wound: No Osteoarthritis Photos Wound Measurements Length: (cm) 0.4 Width: (cm) 0.9 Depth: (cm) 0.1 Area: (cm) 0.283 Volume: (cm) 0.028 % Reduction in Area: 28% % Reduction in Volume: 28.2% Epithelialization: Small (1-33%) Wound Description Classification: Full Thickness With Exposed Support Structures Wound Margin: Distinct, outline attached Exudate Amount: Medium Exudate Type: Serosanguineous Exudate Color: red, brown Foul Odor After Cleansing: No Slough/Fibrino Yes Wound Bed Granulation Amount: Medium (34-66%) Exposed Structure Granulation Quality: Red, Pink Fascia Exposed: No Necrotic Amount: Medium (34-66%) Fat Layer (Subcutaneous Tissue) Exposed: Yes Tendon Exposed: No Muscle Exposed: No Joint Exposed: No Bone Exposed: No Treatment Notes Wound #2 (Toe Second) Wound Laterality: Left Cleanser Soap and Water Discharge  Instruction: May shower and wash wound with dial antibacterial soap and water prior to dressing change. Wound Cleanser Discharge Instruction: Cleanse the wound with wound cleanser prior to applying a clean dressing using gauze sponges, not tissue or cotton balls. Peri-Wound Care Topical Primary Dressing Xeroform Occlusive Gauze Dressing, 4x4 in Discharge Instruction: Apply to wound bed as instructed Secondary Dressing Woven Gauze Sponge, Non-Sterile 4x4 in Discharge Instruction: Apply over primary dressing as directed. Secured With Conforming Stretch Gauze Bandage, Sterile 2x75 (in/in) Discharge Instruction: Secure with stretch gauze as directed. 35M Medipore H Soft Cloth Surgical T ape, 4 x 10 (in/yd) Discharge Instruction: Secure with tape as directed. Stretch Net Size 2, 10 (yds) Compression Wrap Compression Stockings Add-Ons Electronic Signature(s) Signed: 01/17/2022 1:10:23 PM By: [name] RN Entered By: [name] on 01/15/2022 10:47:17 -------------------------------------------------------------------------------- Vitals Details Patient Name: Date of Service: Violet Baldy, A LFO RD 01/15/2022 10:00 A M Medical Record Number: [id number] Patient Account Number: [id number] Date of Birth/Sex: Treating RN: 1941/03/15 (81 y.o. M) Primary Care Heidie Krall: [name] Other Clinician: Referring Iolani Twilley: Treating Jaggar Benko/Extender: [name] in Treatment: 2 Vital Signs Time Taken: 10:38 Temperature (F): 97.6 Pulse (bpm): 66 Respiratory Rate (breaths/min): 16 Blood Pressure (mmHg): 116/66 Reference Range: 80 - 120 mg / dl Electronic Signature(s) Signed: 01/15/2022 4:51:17 PM By: Thayer Dallas Entered By: Thayer Dallas on 01/15/2022 10:38:23

## 2022-01-18 ENCOUNTER — Ambulatory Visit (HOSPITAL_COMMUNITY): Payer: Medicare HMO

## 2022-01-22 ENCOUNTER — Encounter (HOSPITAL_BASED_OUTPATIENT_CLINIC_OR_DEPARTMENT_OTHER): Payer: Medicare HMO | Attending: Physician Assistant | Admitting: Physician Assistant

## 2022-01-22 DIAGNOSIS — E1151 Type 2 diabetes mellitus with diabetic peripheral angiopathy without gangrene: Secondary | ICD-10-CM | POA: Insufficient documentation

## 2022-01-22 DIAGNOSIS — L97522 Non-pressure chronic ulcer of other part of left foot with fat layer exposed: Secondary | ICD-10-CM | POA: Diagnosis present

## 2022-01-22 DIAGNOSIS — E11621 Type 2 diabetes mellitus with foot ulcer: Secondary | ICD-10-CM | POA: Insufficient documentation

## 2022-01-22 DIAGNOSIS — I10 Essential (primary) hypertension: Secondary | ICD-10-CM | POA: Diagnosis not present

## 2022-01-22 NOTE — Progress Notes (Addendum)
[place], [place] ([id number]) [id number] Page 1 of 7 Visit Report for 01/22/2022 Chief Complaint Document Details Patient Name: Date of Service: [name] LFO RD 01/22/2022 10:00 A M Medical Record Number: [id number] Patient Account Number: [id number] Date of Birth/Sex: Treating RN: 05/06/1941 (81 y.o. M) Primary Care Provider: [name] Other Clinician: Referring Provider: Treating Provider/Extender: [name] in Treatment: 3 Information Obtained from: Patient Chief Complaint Left 1st and 2nd toe ulcers Electronic Signature(s) Signed: 01/22/2022 9:49:07 AM By: Lenda Kelp PA-C Entered By: Lenda Kelp on 01/22/2022 09:49:07 -------------------------------------------------------------------------------- HPI Details Patient Name: Date of Service: [name], A LFO RD 01/22/2022 10:00 A M Medical Record Number: [id number] Patient Account Number: [id number] Date of Birth/Sex: Treating RN: 12/27/1940 (81 y.o. M) Primary Care Provider: [name] Other Clinician: Referring Provider: Treating Provider/Extender: [name] in Treatment: 3 History of Present Illness HPI Description: 01-01-2022 upon evaluation today patient appears to be doing poorly in regard to her wound that is on the left great toe and second toe location. The toenail of the second toe covers over a portion of the wound which I am concerned may be hiding a piece of exposed bone based on what I see today. The patient does have a history of peripheral vascular disease he is a patient of Dr. Gery Pray who did an abdominal aortogram previous and was unable to intervene with regard to the lower extremity blood flow. The patient had a right ABI which was noncompressible and a left ABI was 0.6 with TBI's of 0.26 unfortunately. The TBI on the right was 0.62 which was not nearly as bad. Nonetheless the unfortunate thing is that with poor blood flow  and potential for bone exposure this becomes increasingly complicated if the patient does have osteomyelitis to get this to heal. He does have repeat vascular testing on Monday upcoming. The patient is stated to be "prediabetic" according to notes and the patient. Currently he has been on Keflex. Patient has a history of peripheral vascular disease, prediabetes, hypertension, and unfortunately the current wound which has been present for 4 weeks based on what they tell me today. 01-08-2022 upon evaluation today patient appears to be doing about the same in regard to his wound. Again the x-ray was negative that was performed on 17th everything looks to be okay. With that being said he is continuing to have issues here with poor blood flow and perfusion he did have a repeat arterial study and ABI with TBI TBI could not even be obtained because the signals were so dampened. Previously he had been at 0.2. In regard to the ABI was previously around 0.65 he was now around 0.45. Again this was definitely a decrease on the left side compared to previous the right was stable. He is stated to have critical limb ischemia. 01-15-2022 upon evaluation today patient appears to be doing well currently in regard to his wounds all things considered. Were seeing more growth on the big toe versus the second toe but this is to be expected at the second toe does have some bone exposed. Fortunately I do not see any evidence of active infection at this time. He does have an appointment scheduled for September 11 which is Monday for an arteriogram and then subsequently the 12th for bypass surgery. Subsequently I think this is can be very good for him as far as trying to get this healed I believe it is good to be the  best plan that we have as far as getting good blood flow to allow for healing to continue. Once we get good blood flow I think we can then clean away some of the necrotic bone if necessary to try and allow this to  heal more effectively. We also discussed briefly hyperbaric oxygen therapy and the possibility of distal toe amputation if need be but again right now we are still on the plantar stages of even knowing what we can do from a wound care perspective which I think is the first initial goal here. 01-22-2022 upon evaluation today patient appears to be doing better currently in regard to his wounds all things considered. He actually is good to be having his vascular procedures upcoming next week. Nonetheless I think once we get better blood flow will be able to see where things stand. He is also going to have his MRI on Sunday which I think will also be very beneficial for [place] to know exactly what is going on with the second toe in particular and the way we will get a proceed following. [name], [name] ([id number]) [id number] Page 2 of 7 Electronic Signature(s) Signed: 01/24/2022 7:53:55 AM By: Worthy Keeler PA-C Entered By: Worthy Keeler on 01/24/2022 07:53:55 -------------------------------------------------------------------------------- Physical Exam Details Patient Name: Date of Service: [name], A LFO RD 01/22/2022 10:00 A M Medical Record Number: [id number] Patient Account Number: [id number] Date of Birth/Sex: Treating RN: [birth year] (81 y.o. M) Primary Care Provider: Mackie Pai Other Clinician: Referring Provider: Treating Provider/Extender: Sheran Lawless in Treatment: 3 Constitutional Well-nourished and well-hydrated in no acute distress. Respiratory normal breathing without difficulty. Psychiatric this patient is able to make decisions and demonstrates good insight into disease process. Alert and Oriented x 3. pleasant and cooperative. Notes Upon inspection patient's wound bed actually showed signs of good granulation and epithelization in regard to the great toe even with low blood flow he still making some progress here which is  great news. In regard to the second toe again I am unsure whether this is truly an osteomyelitis situation or not I think there is definitely a chance but we will get to see what the MRI shows on Sunday. Electronic Signature(s) Signed: 01/24/2022 7:54:18 AM By: Worthy Keeler PA-C Entered By: Worthy Keeler on 01/24/2022 07:54:18 -------------------------------------------------------------------------------- Physician Orders Details Patient Name: Date of Service: [name], A LFO RD 01/22/2022 10:00 A M Medical Record Number: [id number] Patient Account Number: [id number] Date of Birth/Sex: Treating RN: October 24, 1940 (81 y.o. Erie Noe Primary Care Provider: Mackie Pai Other Clinician: Referring Provider: Treating Provider/Extender: Sheran Lawless in Treatment: 3 Verbal / Phone Orders: No Diagnosis Coding ICD-10 Coding Code Description I73.89 Other specified peripheral vascular diseases L97.522 Non-pressure chronic ulcer of other part of left foot with fat layer exposed R73.03 Prediabetes I10 Essential (primary) hypertension Follow-up Appointments ppointment in 2 weeks. - WEdnesday w/ Jeri Cos and Allayne Butcher Rm # 9 Return A ***Go get your Levaquin Prescription and continue taking antibiotics. Call Dr. Claretha Cooper office to see if he wants you to continue or stop taking the antibiotics before your procedure**** Anesthetic [place], [place] ([id number]) [id number] Page 3 of 7 (In clinic) Topical Lidocaine 5% applied to wound bed Bathing/ Shower/ Hygiene May shower with protection but do not get wound dressing(s) wet. Edema Control - Lymphedema / SCD / Other Elevate legs to the level of the heart or above for 30 minutes daily and/or when sitting, a  frequency of: Avoid standing for long periods of time. Off-Loading Other: - Keep pressure off of Left Foot/toes Wound Treatment Wound #1 - T Great oe Wound Laterality: Left Cleanser:  Soap and Water 1 x Per Day/15 Days Discharge Instructions: May shower and wash wound with dial antibacterial soap and water prior to dressing change. Cleanser: Wound Cleanser (DME) (Generic) 1 x Per Day/15 Days Discharge Instructions: Cleanse the wound with wound cleanser prior to applying a clean dressing using gauze sponges, not tissue or cotton balls. Prim Dressing: Xeroform Occlusive Gauze Dressing, 4x4 in (DME) (Generic) 1 x Per Day/15 Days ary Discharge Instructions: Apply to wound bed as instructed Secondary Dressing: Woven Gauze Sponge, Non-Sterile 4x4 in (DME) (Generic) 1 x Per Day/15 Days Discharge Instructions: Apply over primary dressing as directed. Secured With: Insurance underwriter, Sterile 2x75 (in/in) (DME) (Generic) 1 x Per Day/15 Days Discharge Instructions: Secure with stretch gauze as directed. Secured With: 40M Medipore H Soft Cloth Surgical T ape, 4 x 10 (in/yd) (DME) (Generic) 1 x Per Day/15 Days Discharge Instructions: Secure with tape as directed. Secured With: [employer] Size 2, 10 (yds) (DME) (Generic) 1 x Per Day/15 Days Wound #2 - T Second oe Wound Laterality: Left Cleanser: Soap and Water 1 x Per Day/15 Days Discharge Instructions: May shower and wash wound with dial antibacterial soap and water prior to dressing change. Cleanser: Wound Cleanser (DME) (Generic) 1 x Per Day/15 Days Discharge Instructions: Cleanse the wound with wound cleanser prior to applying a clean dressing using gauze sponges, not tissue or cotton balls. Prim Dressing: Xeroform Occlusive Gauze Dressing, 4x4 in (DME) (Generic) 1 x Per Day/15 Days ary Discharge Instructions: Apply to wound bed as instructed Secondary Dressing: Woven Gauze Sponge, Non-Sterile 4x4 in (DME) (Generic) 1 x Per Day/15 Days Discharge Instructions: Apply over primary dressing as directed. Secured With: Insurance underwriter, Sterile 2x75 (in/in) (DME) (Generic) 1 x Per Day/15 Days Discharge  Instructions: Secure with stretch gauze as directed. Secured With: 40M Medipore H Soft Cloth Surgical T ape, 4 x 10 (in/yd) (DME) (Generic) 1 x Per Day/15 Days Discharge Instructions: Secure with tape as directed. Secured With: Stretch Net Size 2, 10 (yds) (DME) (Generic) 1 x Per Day/15 Days Patient Medications llergies: Crestor, Lipitor, lisinopril A Notifications Medication Indication Start End 01/22/2022 levofloxacin DOSE 1 - oral 750 mg tablet - 1 tablet oral taken 1 time per day for 10 days PRN debridement/pain 01/01/2022 lidocaine DOSE topical 5 % gel - gel topical Electronic Signature(s) Signed: 01/22/2022 7:10:08 PM By: Lenda Kelp PA-C Signed: 05/21/2022 5:37:22 PM By: [name] RN Previous Signature: 01/22/2022 10:40:08 AM Version By: Lenda Kelp PA-C Entered By: [name] on 01/22/2022 11:10:22 [name], [name] ([id number]) [id number] Page 4 of 7 -------------------------------------------------------------------------------- Problem List Details Patient Name: Date of Service: [name] LFO RD 01/22/2022 10:00 A M Medical Record Number: [id number] Patient Account Number: [id number] Date of Birth/Sex: Treating RN: Aug 13, 1940 (81 y.o. M) Primary Care Provider: [name] Other Clinician: Referring Provider: Treating Provider/Extender: [name] in Treatment: 3 Active Problems ICD-10 Encounter Code Description Active Date MDM Diagnosis I73.89 Other specified peripheral vascular diseases 01/01/2022 No Yes L97.522 Non-pressure chronic ulcer of other part of left foot with fat layer exposed 01/01/2022 No Yes R73.03 Prediabetes 01/01/2022 No Yes I10 Essential (primary) hypertension 01/01/2022 No Yes Inactive Problems Resolved Problems Electronic Signature(s) Signed: 01/22/2022 9:49:02 AM By: Lenda Kelp PA-C Entered By: Lenda Kelp on 01/22/2022  09:49:01 -------------------------------------------------------------------------------- Progress Note Details Patient Name: Date of Service: [name] LFO RD 01/22/2022 10:00 A M Medical Record Number: [id number] Patient Account Number: [id number] Date of Birth/Sex: Treating RN: 02/08/1941 (81 y.o. M) Primary Care Provider: Mackie Pai Other Clinician: Referring Provider: Treating Provider/Extender: Sheran Lawless in Treatment: 3 Subjective Chief Complaint Information obtained from Patient Left 1st and 2nd toe ulcers History of Present Illness (HPI) 01-01-2022 upon evaluation today patient appears to be doing poorly in regard to her wound that is on the left great toe and second toe location. The toenail of the second toe covers over a portion of the wound which I am concerned may be hiding a piece of exposed bone based on what I see today. The patient does have a history of peripheral vascular disease he is a patient of Dr. Alvester Chou who did an abdominal aortogram previous and was unable to intervene with regard to the lower extremity blood flow. The patient had a right ABI which was noncompressible and a left ABI was 0.6 with TBI's of 0.26 unfortunately. The TBI on the right was 0.62 which was not nearly as bad. Nonetheless the unfortunate thing is that with poor blood flow and potential for bone exposure this becomes [name], [name] ([id number]) [id number] Page 5 of 7 increasingly complicated if the patient does have osteomyelitis to get this to heal. He does have repeat vascular testing on Monday upcoming. The patient is stated to be "prediabetic" according to notes and the patient. Currently he has been on Keflex. Patient has a history of peripheral vascular disease, prediabetes, hypertension, and unfortunately the current wound which has been present for 4 weeks based on what they tell me today. 01-08-2022 upon evaluation today patient  appears to be doing about the same in regard to his wound. Again the x-ray was negative that was performed on 17th everything looks to be okay. With that being said he is continuing to have issues here with poor blood flow and perfusion he did have a repeat arterial study and ABI with TBI TBI could not even be obtained because the signals were so dampened. Previously he had been at 0.2. In regard to the ABI was previously around 0.65 he was now around 0.45. Again this was definitely a decrease on the left side compared to previous the right was stable. He is stated to have critical limb ischemia. 01-15-2022 upon evaluation today patient appears to be doing well currently in regard to his wounds all things considered. Were seeing more growth on the big toe versus the second toe but this is to be expected at the second toe does have some bone exposed. Fortunately I do not see any evidence of active infection at this time. He does have an appointment scheduled for September 11 which is Monday for an arteriogram and then subsequently the 12th for bypass surgery. Subsequently I think this is can be very good for him as far as trying to get this healed I believe it is good to be the best plan that we have as far as getting good blood flow to allow for healing to continue. Once we get good blood flow I think we can then clean away some of the necrotic bone if necessary to try and allow this to heal more effectively. We also discussed briefly hyperbaric oxygen therapy and the possibility of distal toe amputation if need be but again right now we are still on the plantar stages of even  knowing what we can do from a wound care perspective which I think is the first initial goal here. 01-22-2022 upon evaluation today patient appears to be doing better currently in regard to his wounds all things considered. He actually is good to be having his vascular procedures upcoming next week. Nonetheless I think once we get  better blood flow will be able to see where things stand. He is also going to have his MRI on Sunday which I think will also be very beneficial for [place] to know exactly what is going on with the second toe in particular and the way we will get a proceed following. Objective Constitutional Well-nourished and well-hydrated in no acute distress. Vitals Time Taken: 10:00 AM, Temperature: 97.9 F, Pulse: 78 bpm, Respiratory Rate: 17 breaths/min, Blood Pressure: 119/75 mmHg. Respiratory normal breathing without difficulty. Psychiatric this patient is able to make decisions and demonstrates good insight into disease process. Alert and Oriented x 3. pleasant and cooperative. General Notes: Upon inspection patient's wound bed actually showed signs of good granulation and epithelization in regard to the great toe even with low blood flow he still making some progress here which is great news. In regard to the second toe again I am unsure whether this is truly an osteomyelitis situation or not I think there is definitely a chance but we will get to see what the MRI shows on Sunday. Integumentary (Hair, Skin) Wound #1 status is Open. Original cause of wound was Trauma. The date acquired was: 12/01/2021. The wound has been in treatment 3 weeks. The wound is located on the Left T Great. The wound measures 4.4cm length x 1.5cm width x 0.2cm depth; 5.184cm^2 area and 1.037cm^3 volume. There is Fat Layer oe (Subcutaneous Tissue) exposed. There is no tunneling or undermining noted. There is a medium amount of serosanguineous drainage noted. The wound margin is distinct with the outline attached to the wound base. There is medium (34-66%) red, pink granulation within the wound bed. There is a medium (34-66%) amount of necrotic tissue within the wound bed. Wound #2 status is Open. Original cause of wound was Trauma. The date acquired was: 12/01/2021. The wound has been in treatment 3 weeks. The wound is located on  the Left T Second. The wound measures 0.4cm length x 0.9cm width x 0.3cm depth; 0.283cm^2 area and 0.085cm^3 volume. There is Fat Layer oe (Subcutaneous Tissue) exposed. There is no tunneling or undermining noted. There is a medium amount of serosanguineous drainage noted. The wound margin is distinct with the outline attached to the wound base. There is medium (34-66%) red, pink granulation within the wound bed. There is a medium (34-66%) amount of necrotic tissue within the wound bed. Assessment Active Problems ICD-10 Other specified peripheral vascular diseases Non-pressure chronic ulcer of other part of left foot with fat layer exposed Prediabetes Essential (primary) hypertension Plan Follow-up Appointments: Return Appointment in 2 weeks. - WEdnesday w/ Allen Derry and Maryruth Bun Rm # 9 ***Go get your Levaquin Prescription and continue taking antibiotics. Call Dr. Lucretia Roers, [name] ([id number]) [id number] Page 6 of 7 [facility]'s office to see if he wants you to continue or stop taking the antibiotics before your procedure**** Anesthetic: (In clinic) Topical Lidocaine 5% applied to wound bed Bathing/ Shower/ Hygiene: May shower with protection but do not get wound dressing(s) wet. Edema Control - Lymphedema / SCD / Other: Elevate legs to the level of the heart or above for 30 minutes daily and/or when sitting, a frequency of: Avoid standing  for long periods of time. Off-Loading: Other: - Keep pressure off of Left Foot/toes The following medication(s) was prescribed: levofloxacin oral 750 mg tablet 1 1 tablet oral taken 1 time per day for 10 days starting 01/22/2022 lidocaine topical 5 % gel gel topical for PRN debridement/pain was prescribed at facility WOUND #1: - T Great Wound Laterality: Left oe Cleanser: Soap and Water 1 x Per Day/15 Days Discharge Instructions: May shower and wash wound with dial antibacterial soap and water prior to dressing change. Cleanser: Wound  Cleanser (DME) (Generic) 1 x Per Day/15 Days Discharge Instructions: Cleanse the wound with wound cleanser prior to applying a clean dressing using gauze sponges, not tissue or cotton balls. Prim Dressing: Xeroform Occlusive Gauze Dressing, 4x4 in (DME) (Generic) 1 x Per Day/15 Days ary Discharge Instructions: Apply to wound bed as instructed Secondary Dressing: Woven Gauze Sponge, Non-Sterile 4x4 in (DME) (Generic) 1 x Per Day/15 Days Discharge Instructions: Apply over primary dressing as directed. Secured With: Child psychotherapist, Sterile 2x75 (in/in) (DME) (Generic) 1 x Per Day/15 Days Discharge Instructions: Secure with stretch gauze as directed. Secured With: 150M Medipore H Soft Cloth Surgical T ape, 4 x 10 (in/yd) (DME) (Generic) 1 x Per Day/15 Days Discharge Instructions: Secure with tape as directed. Secured With: [employer] Size 2, 10 (yds) (DME) (Generic) 1 x Per Day/15 Days WOUND #2: - T Second Wound Laterality: Left oe Cleanser: Soap and Water 1 x Per Day/15 Days Discharge Instructions: May shower and wash wound with dial antibacterial soap and water prior to dressing change. Cleanser: Wound Cleanser (DME) (Generic) 1 x Per Day/15 Days Discharge Instructions: Cleanse the wound with wound cleanser prior to applying a clean dressing using gauze sponges, not tissue or cotton balls. Prim Dressing: Xeroform Occlusive Gauze Dressing, 4x4 in (DME) (Generic) 1 x Per Day/15 Days ary Discharge Instructions: Apply to wound bed as instructed Secondary Dressing: Woven Gauze Sponge, Non-Sterile 4x4 in (DME) (Generic) 1 x Per Day/15 Days Discharge Instructions: Apply over primary dressing as directed. Secured With: Child psychotherapist, Sterile 2x75 (in/in) (DME) (Generic) 1 x Per Day/15 Days Discharge Instructions: Secure with stretch gauze as directed. Secured With: 150M Medipore H Soft Cloth Surgical T ape, 4 x 10 (in/yd) (DME) (Generic) 1 x Per Day/15 Days Discharge  Instructions: Secure with tape as directed. Secured With: Stretch Net Size 2, 10 (yds) (DME) (Generic) 1 x Per Day/15 Days 1. I would recommend currently that we go ahead and continue with the recommendation for wound care measures as before. He has been utilizing the Xeroform gauze dressing which I think is still probably can to be the best way to go. 2. Also can recommend that we have the patient continue to monitor for any signs of worsening or infection. Office if anything changes he should contact the office and let me know. We will see patient back for reevaluation in 2 weeks here in the clinic. If anything worsens or changes patient will contact our office for additional recommendations. Electronic Signature(s) Signed: 01/24/2022 7:54:45 AM By: Worthy Keeler PA-C Entered By: Worthy Keeler on 01/24/2022 07:54:45 -------------------------------------------------------------------------------- SuperBill Details Patient Name: Date of Service: [name], A LFO RD 01/22/2022 Medical Record Number: [id number] Patient Account Number: [id number] Date of Birth/Sex: Treating RN: 01-29-1941 (81 y.o. [place] Noe Primary Care Provider: Mackie Pai Other Clinician: Referring Provider: Treating Provider/Extender: Sheran Lawless in Treatment: 3 Diagnosis Coding ICD-10 Codes Code Description [phone] Other specified peripheral  vascular diseases L97.522 Non-pressure chronic ulcer of other part of left foot with fat layer exposed R73.03 Prediabetes I10 Essential (primary) hypertension Betten, [name] ([id number]) [id number] Page 7 of 7 Facility Procedures : CPT4 Code: [id number] Description: [id number] - WOUND CARE VISIT-LEV 4 EST PT Modifier: Quantity: 1 Physician Procedures : CPT4 Code Description Modifier [id number] - WC PHYS LEVEL 4 - EST PT ICD-10 Diagnosis Description I73.89 Other specified peripheral vascular diseases L97.522  Non-pressure chronic ulcer of other part of left foot with fat layer exposed R73.03  Prediabetes I10 Essential (primary) hypertension Quantity: 1 Electronic Signature(s) Signed: 01/22/2022 6:59:59 PM By: Worthy Keeler PA-C Previous Signature: 01/22/2022 6:59:49 PM Version By: Worthy Keeler PA-C Entered By: Worthy Keeler on 01/22/2022 18:59:59

## 2022-01-26 ENCOUNTER — Ambulatory Visit (HOSPITAL_COMMUNITY)
Admission: RE | Admit: 2022-01-26 | Discharge: 2022-01-26 | Disposition: A | Discharge status: Home / Self Care | Payer: Medicare HMO | Source: Ambulatory Visit | Attending: Physician Assistant | Admitting: Physician Assistant

## 2022-01-26 DIAGNOSIS — L97522 Non-pressure chronic ulcer of other part of left foot with fat layer exposed: Secondary | ICD-10-CM

## 2022-01-26 MED ORDER — GADOBUTROL 1 MMOL/ML IV SOLN
10.0000 mL | Freq: Once | INTRAVENOUS | Status: AC | PRN
  Administered 2022-01-26: 10 mL via INTRAVENOUS

## 2022-01-27 ENCOUNTER — Other Ambulatory Visit: Payer: Self-pay

## 2022-01-27 ENCOUNTER — Inpatient Hospital Stay (HOSPITAL_COMMUNITY)
Admission: RE | Admit: 2022-01-27 | Discharge: 2022-01-30 | DRG: 254 | Disposition: A | Discharge status: Home Health | Payer: Medicare HMO | Attending: Vascular Surgery | Admitting: Vascular Surgery

## 2022-01-27 ENCOUNTER — Inpatient Hospital Stay (HOSPITAL_COMMUNITY): Payer: Medicare HMO

## 2022-01-27 ENCOUNTER — Encounter (HOSPITAL_COMMUNITY)
Admission: RE | Disposition: A | Discharge status: Home / Self Care | Payer: Self-pay | Source: Ambulatory Visit | Attending: Vascular Surgery

## 2022-01-27 ENCOUNTER — Encounter (HOSPITAL_COMMUNITY): Payer: Self-pay | Admitting: Vascular Surgery

## 2022-01-27 DIAGNOSIS — Z7984 Long term (current) use of oral hypoglycemic drugs: Secondary | ICD-10-CM | POA: Diagnosis not present

## 2022-01-27 DIAGNOSIS — I739 Peripheral vascular disease, unspecified: Secondary | ICD-10-CM

## 2022-01-27 DIAGNOSIS — Z888 Allergy status to other drugs, medicaments and biological substances status: Secondary | ICD-10-CM

## 2022-01-27 DIAGNOSIS — Z87891 Personal history of nicotine dependence: Secondary | ICD-10-CM | POA: Diagnosis not present

## 2022-01-27 DIAGNOSIS — Z951 Presence of aortocoronary bypass graft: Secondary | ICD-10-CM | POA: Diagnosis not present

## 2022-01-27 DIAGNOSIS — E1151 Type 2 diabetes mellitus with diabetic peripheral angiopathy without gangrene: Principal | ICD-10-CM | POA: Diagnosis present

## 2022-01-27 DIAGNOSIS — E785 Hyperlipidemia, unspecified: Secondary | ICD-10-CM | POA: Diagnosis present

## 2022-01-27 DIAGNOSIS — E291 Testicular hypofunction: Secondary | ICD-10-CM | POA: Diagnosis present

## 2022-01-27 DIAGNOSIS — Z01810 Encounter for preprocedural cardiovascular examination: Secondary | ICD-10-CM

## 2022-01-27 DIAGNOSIS — Z833 Family history of diabetes mellitus: Secondary | ICD-10-CM

## 2022-01-27 DIAGNOSIS — I2510 Atherosclerotic heart disease of native coronary artery without angina pectoris: Secondary | ICD-10-CM | POA: Diagnosis not present

## 2022-01-27 DIAGNOSIS — Z832 Family history of diseases of the blood and blood-forming organs and certain disorders involving the immune mechanism: Secondary | ICD-10-CM

## 2022-01-27 DIAGNOSIS — Z7902 Long term (current) use of antithrombotics/antiplatelets: Secondary | ICD-10-CM

## 2022-01-27 DIAGNOSIS — Z8249 Family history of ischemic heart disease and other diseases of the circulatory system: Secondary | ICD-10-CM

## 2022-01-27 DIAGNOSIS — N529 Male erectile dysfunction, unspecified: Secondary | ICD-10-CM | POA: Diagnosis present

## 2022-01-27 DIAGNOSIS — L97529 Non-pressure chronic ulcer of other part of left foot with unspecified severity: Secondary | ICD-10-CM | POA: Diagnosis present

## 2022-01-27 DIAGNOSIS — E11621 Type 2 diabetes mellitus with foot ulcer: Secondary | ICD-10-CM | POA: Diagnosis present

## 2022-01-27 DIAGNOSIS — Z7982 Long term (current) use of aspirin: Secondary | ICD-10-CM

## 2022-01-27 DIAGNOSIS — I10 Essential (primary) hypertension: Secondary | ICD-10-CM | POA: Diagnosis present

## 2022-01-27 DIAGNOSIS — I70245 Atherosclerosis of native arteries of left leg with ulceration of other part of foot: Secondary | ICD-10-CM

## 2022-01-27 DIAGNOSIS — M1990 Unspecified osteoarthritis, unspecified site: Secondary | ICD-10-CM | POA: Diagnosis present

## 2022-01-27 DIAGNOSIS — Z79899 Other long term (current) drug therapy: Secondary | ICD-10-CM

## 2022-01-27 HISTORY — PX: ABDOMINAL AORTOGRAM W/LOWER EXTREMITY: CATH118223

## 2022-01-27 LAB — CBC
HCT: 40.2 % (ref 39.0–52.0)
Hemoglobin: 13.5 g/dL (ref 13.0–17.0)
MCH: 29.3 pg (ref 26.0–34.0)
MCHC: 33.6 g/dL (ref 30.0–36.0)
MCV: 87.2 fL (ref 80.0–100.0)
Platelets: 258 10*3/uL (ref 150–400)
RBC: 4.61 MIL/uL (ref 4.22–5.81)
RDW: 13.6 % (ref 11.5–15.5)
WBC: 9 10*3/uL (ref 4.0–10.5)
nRBC: 0 % (ref 0.0–0.2)

## 2022-01-27 LAB — POCT I-STAT, CHEM 8
BUN: 14 mg/dL (ref 8–23)
Calcium, Ion: 1.14 mmol/L — ABNORMAL LOW (ref 1.15–1.40)
Chloride: 104 mmol/L (ref 98–111)
Creatinine, Ser: 0.6 mg/dL — ABNORMAL LOW (ref 0.61–1.24)
Glucose, Bld: 120 mg/dL — ABNORMAL HIGH (ref 70–99)
HCT: 41 % (ref 39.0–52.0)
Hemoglobin: 13.9 g/dL (ref 13.0–17.0)
Potassium: 4 mmol/L (ref 3.5–5.1)
Sodium: 141 mmol/L (ref 135–145)
TCO2: 25 mmol/L (ref 22–32)

## 2022-01-27 LAB — CREATININE, SERUM
Creatinine, Ser: 0.74 mg/dL (ref 0.61–1.24)
GFR, Estimated: >60 mL/min (ref >60)

## 2022-01-27 LAB — GLUCOSE, CAPILLARY
Glucose-Capillary: 108 mg/dL — ABNORMAL HIGH (ref 70–99)
Glucose-Capillary: 94 mg/dL (ref 70–99)

## 2022-01-27 SURGERY — ABDOMINAL AORTOGRAM W/LOWER EXTREMITY
Anesthesia: LOCAL | Laterality: Left

## 2022-01-27 MED ORDER — CLOPIDOGREL BISULFATE 75 MG PO TABS
75.0000 mg | ORAL_TABLET | Freq: Every day | ORAL | Status: DC
  Administered 2022-01-29 – 2022-01-30 (×2): 75 mg via ORAL
  Filled 2022-01-27 (×2): qty 1

## 2022-01-27 MED ORDER — OXYCODONE HCL 5 MG PO TABS: 5.0000 mg | ORAL_TABLET | ORAL | Status: DC | PRN

## 2022-01-27 MED ORDER — SODIUM CHLORIDE 0.9 % IV SOLN: INTRAVENOUS | Status: AC

## 2022-01-27 MED ORDER — HYDRALAZINE HCL 20 MG/ML IJ SOLN: 5.0000 mg | INTRAMUSCULAR | Status: DC | PRN

## 2022-01-27 MED ORDER — ONDANSETRON HCL 4 MG/2ML IJ SOLN: 4.0000 mg | Freq: Four times a day (QID) | INTRAMUSCULAR | Status: DC | PRN

## 2022-01-27 MED ORDER — HEPARIN (PORCINE) IN NACL 1000-0.9 UT/500ML-% IV SOLN
INTRAVENOUS | Status: AC
  Filled 2022-01-27: qty 500

## 2022-01-27 MED ORDER — AMLODIPINE BESYLATE 10 MG PO TABS
10.0000 mg | ORAL_TABLET | Freq: Every day | ORAL | Status: DC
  Administered 2022-01-29 – 2022-01-30 (×2): 10 mg via ORAL
  Filled 2022-01-27 (×2): qty 1

## 2022-01-27 MED ORDER — FAMOTIDINE 20 MG PO TABS
20.0000 mg | ORAL_TABLET | Freq: Every day | ORAL | Status: DC
  Administered 2022-01-29 – 2022-01-30 (×2): 20 mg via ORAL
  Filled 2022-01-27 (×2): qty 1

## 2022-01-27 MED ORDER — SODIUM CHLORIDE 0.9% FLUSH: 3.0000 mL | Freq: Two times a day (BID) | INTRAVENOUS | Status: DC

## 2022-01-27 MED ORDER — IODIXANOL 320 MG/ML IV SOLN
INTRAVENOUS | Status: DC | PRN
  Administered 2022-01-27: 55 mL

## 2022-01-27 MED ORDER — LORATADINE 10 MG PO TABS
10.0000 mg | ORAL_TABLET | Freq: Every evening | ORAL | Status: DC
  Administered 2022-01-28 – 2022-01-29 (×2): 10 mg via ORAL
  Filled 2022-01-27 (×3): qty 1

## 2022-01-27 MED ORDER — COQ10 200 MG PO CAPS: 200.0000 mg | ORAL_CAPSULE | Freq: Every day | ORAL | Status: DC

## 2022-01-27 MED ORDER — VITAMIN D 25 MCG (1000 UNIT) PO TABS
2000.0000 [IU] | ORAL_TABLET | Freq: Every day | ORAL | Status: DC
  Administered 2022-01-29 – 2022-01-30 (×2): 2000 [IU] via ORAL
  Filled 2022-01-27 (×2): qty 2

## 2022-01-27 MED ORDER — SODIUM CHLORIDE 0.9 % IV SOLN: 250.0000 mL | INTRAVENOUS | Status: DC | PRN

## 2022-01-27 MED ORDER — HEPARIN (PORCINE) IN NACL 1000-0.9 UT/500ML-% IV SOLN
INTRAVENOUS | Status: DC | PRN
  Administered 2022-01-27 (×2): 500 mL

## 2022-01-27 MED ORDER — CILOSTAZOL 50 MG PO TABS
50.0000 mg | ORAL_TABLET | Freq: Two times a day (BID) | ORAL | Status: DC
  Administered 2022-01-27 – 2022-01-30 (×5): 50 mg via ORAL
  Filled 2022-01-27 (×5): qty 1

## 2022-01-27 MED ORDER — NITROGLYCERIN 0.4 MG SL SUBL
0.4000 mg | SUBLINGUAL_TABLET | SUBLINGUAL | Status: DC | PRN
  Administered 2022-01-29: 0.4 mg via SUBLINGUAL
  Filled 2022-01-27: qty 1

## 2022-01-27 MED ORDER — LIDOCAINE HCL (PF) 1 % IJ SOLN
INTRAMUSCULAR | Status: DC | PRN
  Administered 2022-01-27: 15 mL

## 2022-01-27 MED ORDER — ASPIRIN 81 MG PO TBEC
81.0000 mg | DELAYED_RELEASE_TABLET | Freq: Every day | ORAL | Status: DC
  Administered 2022-01-28 – 2022-01-30 (×3): 81 mg via ORAL
  Filled 2022-01-27 (×3): qty 1

## 2022-01-27 MED ORDER — SODIUM CHLORIDE 0.9 % IV SOLN: INTRAVENOUS | Status: DC

## 2022-01-27 MED ORDER — HEPARIN SODIUM (PORCINE) 5000 UNIT/ML IJ SOLN
5000.0000 [IU] | Freq: Three times a day (TID) | INTRAMUSCULAR | Status: DC
  Administered 2022-01-27 – 2022-01-28 (×2): 5000 [IU] via SUBCUTANEOUS
  Filled 2022-01-27 (×2): qty 1

## 2022-01-27 MED ORDER — FLUTICASONE PROPIONATE 50 MCG/ACT NA SUSP: 2.0000 | Freq: Every day | NASAL | Status: DC

## 2022-01-27 MED ORDER — LABETALOL HCL 5 MG/ML IV SOLN: 10.0000 mg | INTRAVENOUS | Status: DC | PRN

## 2022-01-27 MED ORDER — ACETAMINOPHEN 325 MG PO TABS: 650.0000 mg | ORAL_TABLET | ORAL | Status: DC | PRN

## 2022-01-27 MED ORDER — LIDOCAINE HCL (PF) 1 % IJ SOLN
INTRAMUSCULAR | Status: AC
  Filled 2022-01-27: qty 30

## 2022-01-27 MED ORDER — SODIUM CHLORIDE 0.9% FLUSH: 3.0000 mL | INTRAVENOUS | Status: DC | PRN

## 2022-01-27 MED ORDER — METOPROLOL TARTRATE 50 MG PO TABS
50.0000 mg | ORAL_TABLET | Freq: Every day | ORAL | Status: DC
  Administered 2022-01-28 – 2022-01-30 (×3): 50 mg via ORAL
  Filled 2022-01-27 (×3): qty 1

## 2022-01-27 MED ORDER — ISOSORBIDE MONONITRATE ER 60 MG PO TB24
60.0000 mg | ORAL_TABLET | Freq: Every day | ORAL | Status: DC
  Administered 2022-01-27 – 2022-01-30 (×3): 60 mg via ORAL
  Filled 2022-01-27 (×3): qty 1

## 2022-01-27 SURGICAL SUPPLY — 13 items
CATH OMNI FLUSH 5F 65CM (CATHETERS) IMPLANT
CATH STRAIGHT 5FR 65CM (CATHETERS) IMPLANT
KIT MICROPUNCTURE NIT STIFF (SHEATH) IMPLANT
KIT PV (KITS) ×1 IMPLANT
SHEATH PINNACLE 5F 10CM (SHEATH) IMPLANT
SHEATH PROBE COVER 6X72 (BAG) IMPLANT
STOPCOCK MORSE 400PSI 3WAY (MISCELLANEOUS) IMPLANT
SYR MEDRAD MARK 7 150ML (SYRINGE) ×1 IMPLANT
TRANSDUCER W/STOPCOCK (MISCELLANEOUS) ×1 IMPLANT
TRAY PV CATH (CUSTOM PROCEDURE TRAY) ×1 IMPLANT
TUBING CIL FLEX 10 FLL-RA (TUBING) IMPLANT
WIRE BENTSON .035X145CM (WIRE) IMPLANT
WIRE TORQFLEX AUST .018X40CM (WIRE) IMPLANT

## 2022-01-27 NOTE — Op Note (Signed)
    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 Randie Heinz, MD Procedure Performed: 1.  Ultrasound-guided cannulation of the left common femoral artery 2.  Aortogram with pullback gradient 3.  Left lower extremity angiography  Indications: 81 year old male with history of left lower extremity endovascular intervention and left common iliac artery stent.  He now has chronic limb threatening ischemia on the left with toe ulceration is indicated for repeat angiography to plan bypass surgery.  Findings: The aorta and iliac segments are heavily calcified the stent is widely patent there is no pullback gradient or obvious stenosis by angiography.  The left lower extremity common femoral artery is subtotally occluded the SFA is occluded as is the popliteal artery for a long segment he reconstitutes below the knee appears to have dominant runoff via the anterior tibial artery although the peroneal distally is also patent.  Plan for admission today and surgery tomorrow left common femoral to below-knee bypass likely to the anterior tibial artery..  Vein mapping ordered for today.   Procedure:  The patient was identified in the holding area and taken to room 8.  The patient was then placed supine on the table and prepped and draped in the usual sterile fashion.  A time out was called.  Ultrasound was used to evaluate the left common femoral artery.  This was heavily calcified there was no pulsatility.  I anesthetized the area and then cannulated the artery with direct ultrasound visualization followed by wire and sheath.  An image was saved the permanent record.  I placed a Bentson wire under direct fluoroscopic guidance followed by Omni catheter to the level of L1 and performed aortogram and then exchanged for a straight  catheter and performed a pullback gradient of which was less than 5 mmHg from the aorta to the sheath.  Followed by this we then performed left lower extremity angiography with the above findings and we will plan for bypass tomorrow.  Sheath will be pulled postoperative holding.  He tolerated the procedure without immediate complication.   Contrast: 55cc   Tyaisha Cullom C. Randie Heinz, MD [facility] [place] Office: [phone] Pager: [phone]

## 2022-01-27 NOTE — Progress Notes (Signed)
Lower extremity vein mapping has been completed.   Preliminary results in CV Proc.   Aundra Millet Vaneta Hammontree 01/27/2022 5:14 PM

## 2022-01-27 NOTE — Interval H&P Note (Signed)
History and Physical Interval Note:  01/27/2022 11:26 AM  [name]  has presented today for surgery, with the diagnosis of pad.  The various methods of treatment have been discussed with the patient and family. After consideration of risks, benefits and other options for treatment, the patient has consented to  Procedure(s): ABDOMINAL AORTOGRAM W/LOWER EXTREMITY (N/A) as a surgical intervention.  The patient's history has been reviewed, patient examined, no change in status, stable for surgery.  I have reviewed the patient's chart and labs.  Questions were answered to the patient's satisfaction.     Lemar Livings

## 2022-01-27 NOTE — Anesthesia Preprocedure Evaluation (Signed)
Anesthesia Evaluation  Patient identified by MRN, date of birth, ID band Patient awake    Reviewed: Allergy & Precautions, NPO status , Patient's Chart, lab work & pertinent test results  Airway Mallampati: II  TM Distance: >3 FB Neck ROM: Full    Dental  (+) Edentulous Upper, Edentulous Lower, Dental Advisory Given   Pulmonary neg pulmonary ROS, former smoker,    Pulmonary exam normal breath sounds clear to auscultation       Cardiovascular hypertension, Pt. on home beta blockers and Pt. on medications + CAD, + CABG and + Peripheral Vascular Disease  Normal cardiovascular exam Rhythm:Regular Rate:Normal  Cath 06/2020 ? Ost LM to Mid LM lesion is 90% stenosed. ? Ost Cx lesion is 90% stenosed. ? Prox Cx lesion is 100% stenosed. ? Prox LAD to Mid LAD lesion is 100% stenosed. ? Dist LAD lesion is 90% stenosed. ? The left ventricular systolic function is normal. ? LV end diastolic pressure is normal. ? The left ventricular ejection fraction is 55-65% by visual estimate.   Stress MPS 2021 ? Nuclear stress EF: 54%. Visually, the EF appears to be greater ? There was no ST segment deviation noted during stress. ? This is a low risk study. No evidence of ischemia. No evidence of previous infarction. ? The study is normal.    Neuro/Psych negative neurological ROS     GI/Hepatic negative GI ROS, Neg liver ROS,   Endo/Other  diabetes  Renal/GU negative Renal ROS     Musculoskeletal  (+) Arthritis ,   Abdominal   Peds  Hematology negative hematology ROS (+)   Anesthesia Other Findings   Reproductive/Obstetrics                           Anesthesia Physical Anesthesia Plan  ASA: 3  Anesthesia Plan: General   Post-op Pain Management: Tylenol PO (pre-op)*   Induction: Intravenous  PONV Risk Score and Plan: 2 and Dexamethasone, Ondansetron and Treatment may vary due to age or medical  condition  Airway Management Planned: Oral ETT  Additional Equipment: Arterial line  Intra-op Plan:   Post-operative Plan: Extubation in OR  Informed Consent: I have reviewed the patients History and Physical, chart, labs and discussed the procedure including the risks, benefits and alternatives for the proposed anesthesia with the patient or authorized representative who has indicated his/her understanding and acceptance.     Dental advisory given  Plan Discussed with: CRNA  Anesthesia Plan Comments:        Anesthesia Quick Evaluation

## 2022-01-27 NOTE — Progress Notes (Signed)
Patient arrived to 4E from Cath Lab.  VSS and CCMD notified.

## 2022-01-28 ENCOUNTER — Encounter (HOSPITAL_COMMUNITY)
Admission: RE | Disposition: A | Discharge status: Home / Self Care | Payer: Self-pay | Source: Ambulatory Visit | Attending: Vascular Surgery

## 2022-01-28 ENCOUNTER — Inpatient Hospital Stay (HOSPITAL_COMMUNITY): Payer: Medicare HMO | Admitting: Anesthesiology

## 2022-01-28 ENCOUNTER — Inpatient Hospital Stay (HOSPITAL_COMMUNITY): Admission: RE | Admit: 2022-01-28 | Payer: Medicare HMO | Source: Home / Self Care | Admitting: Vascular Surgery

## 2022-01-28 ENCOUNTER — Encounter (HOSPITAL_COMMUNITY): Payer: Self-pay | Admitting: Vascular Surgery

## 2022-01-28 DIAGNOSIS — Z7984 Long term (current) use of oral hypoglycemic drugs: Secondary | ICD-10-CM

## 2022-01-28 DIAGNOSIS — E1151 Type 2 diabetes mellitus with diabetic peripheral angiopathy without gangrene: Secondary | ICD-10-CM | POA: Diagnosis not present

## 2022-01-28 DIAGNOSIS — Z87891 Personal history of nicotine dependence: Secondary | ICD-10-CM

## 2022-01-28 DIAGNOSIS — I10 Essential (primary) hypertension: Secondary | ICD-10-CM

## 2022-01-28 DIAGNOSIS — I70245 Atherosclerosis of native arteries of left leg with ulceration of other part of foot: Secondary | ICD-10-CM

## 2022-01-28 DIAGNOSIS — L97529 Non-pressure chronic ulcer of other part of left foot with unspecified severity: Secondary | ICD-10-CM | POA: Diagnosis not present

## 2022-01-28 DIAGNOSIS — I2510 Atherosclerotic heart disease of native coronary artery without angina pectoris: Secondary | ICD-10-CM | POA: Diagnosis not present

## 2022-01-28 HISTORY — PX: ENDARTERECTOMY FEMORAL: SHX5804

## 2022-01-28 HISTORY — PX: FEMORAL-POPLITEAL BYPASS GRAFT: SHX937

## 2022-01-28 LAB — CBC
HCT: 34.8 % — ABNORMAL LOW (ref 39.0–52.0)
Hemoglobin: 11.7 g/dL — ABNORMAL LOW (ref 13.0–17.0)
MCH: 29.5 pg (ref 26.0–34.0)
MCHC: 33.6 g/dL (ref 30.0–36.0)
MCV: 87.9 fL (ref 80.0–100.0)
Platelets: 212 10*3/uL (ref 150–400)
RBC: 3.96 MIL/uL — ABNORMAL LOW (ref 4.22–5.81)
RDW: 13.7 % (ref 11.5–15.5)
WBC: 9.1 10*3/uL (ref 4.0–10.5)
nRBC: 0 % (ref 0.0–0.2)

## 2022-01-28 LAB — COMPREHENSIVE METABOLIC PANEL
ALT: 18 U/L (ref 0–44)
AST: 23 U/L (ref 15–41)
Albumin: 3 g/dL — ABNORMAL LOW (ref 3.5–5.0)
Alkaline Phosphatase: 48 U/L (ref 38–126)
Anion gap: 7 (ref 5–15)
BUN: 12 mg/dL (ref 8–23)
CO2: 23 mmol/L (ref 22–32)
Calcium: 8.3 mg/dL — ABNORMAL LOW (ref 8.9–10.3)
Chloride: 108 mmol/L (ref 98–111)
Creatinine, Ser: 0.84 mg/dL (ref 0.61–1.24)
GFR, Estimated: >60 mL/min (ref >60)
Glucose, Bld: 129 mg/dL — ABNORMAL HIGH (ref 70–99)
Potassium: 3.5 mmol/L (ref 3.5–5.1)
Sodium: 138 mmol/L (ref 135–145)
Total Bilirubin: 1 mg/dL (ref 0.3–1.2)
Total Protein: 5.6 g/dL — ABNORMAL LOW (ref 6.5–8.1)

## 2022-01-28 LAB — LIPID PANEL
Cholesterol: 70 mg/dL (ref 0–200)
HDL: 31 mg/dL — ABNORMAL LOW (ref >40)
LDL Cholesterol: 24 mg/dL (ref 0–99)
Total CHOL/HDL Ratio: 2.3 RATIO
Triglycerides: 75 mg/dL (ref <150)
VLDL: 15 mg/dL (ref 0–40)

## 2022-01-28 LAB — POCT I-STAT 7, (LYTES, BLD GAS, ICA,H+H)
Acid-Base Excess: 1 mmol/L (ref 0.0–2.0)
Acid-Base Excess: 0 mmol/L (ref 0.0–2.0)
Bicarbonate: 27.4 mmol/L (ref 20.0–28.0)
Bicarbonate: 25.4 mmol/L (ref 20.0–28.0)
Calcium, Ion: 1.18 mmol/L (ref 1.15–1.40)
Calcium, Ion: 1.15 mmol/L (ref 1.15–1.40)
HCT: 32 % — ABNORMAL LOW (ref 39.0–52.0)
HCT: 29 % — ABNORMAL LOW (ref 39.0–52.0)
Hemoglobin: 10.9 g/dL — ABNORMAL LOW (ref 13.0–17.0)
Hemoglobin: 9.9 g/dL — ABNORMAL LOW (ref 13.0–17.0)
O2 Saturation: 97 %
O2 Saturation: 97 %
Potassium: 4.2 mmol/L (ref 3.5–5.1)
Potassium: 4.2 mmol/L (ref 3.5–5.1)
Sodium: 139 mmol/L (ref 135–145)
Sodium: 138 mmol/L (ref 135–145)
TCO2: 29 mmol/L (ref 22–32)
TCO2: 27 mmol/L (ref 22–32)
pCO2 arterial: 52 mmHg — ABNORMAL HIGH (ref 32–48)
pCO2 arterial: 45.4 mmHg (ref 32–48)
pH, Arterial: 7.329 — ABNORMAL LOW (ref 7.35–7.45)
pH, Arterial: 7.356 (ref 7.35–7.45)
pO2, Arterial: 103 mmHg (ref 83–108)
pO2, Arterial: 99 mmHg (ref 83–108)

## 2022-01-28 LAB — PROTIME-INR
INR: 1.2 (ref 0.8–1.2)
Prothrombin Time: 15.2 seconds (ref 11.4–15.2)

## 2022-01-28 LAB — TYPE AND SCREEN
ABO/RH(D): A POS
Antibody Screen: NEGATIVE

## 2022-01-28 LAB — APTT: aPTT: 34 seconds (ref 24–36)

## 2022-01-28 LAB — SURGICAL PCR SCREEN
MRSA, PCR: NEGATIVE
Staphylococcus aureus: NEGATIVE

## 2022-01-28 LAB — POCT ACTIVATED CLOTTING TIME
Activated Clotting Time: 269 seconds
Activated Clotting Time: 293 seconds

## 2022-01-28 LAB — GLUCOSE, CAPILLARY
Glucose-Capillary: 135 mg/dL — ABNORMAL HIGH (ref 70–99)
Glucose-Capillary: 150 mg/dL — ABNORMAL HIGH (ref 70–99)

## 2022-01-28 LAB — ABO/RH: ABO/RH(D): A POS

## 2022-01-28 SURGERY — BYPASS GRAFT FEMORAL-POPLITEAL ARTERY
Anesthesia: General | Site: Leg Upper | Laterality: Left

## 2022-01-28 MED ORDER — CHLORHEXIDINE GLUCONATE CLOTH 2 % EX PADS
6.0000 | MEDICATED_PAD | Freq: Once | CUTANEOUS | Status: AC
  Administered 2022-01-28: 6 via TOPICAL

## 2022-01-28 MED ORDER — LACTATED RINGERS IV SOLN: INTRAVENOUS | Status: DC

## 2022-01-28 MED ORDER — PHENYLEPHRINE 80 MCG/ML (10ML) SYRINGE FOR IV PUSH (FOR BLOOD PRESSURE SUPPORT)
PREFILLED_SYRINGE | INTRAVENOUS | Status: AC
  Filled 2022-01-28: qty 10

## 2022-01-28 MED ORDER — SODIUM CHLORIDE 0.9 % IV SOLN: INTRAVENOUS | Status: DC

## 2022-01-28 MED ORDER — ROCURONIUM BROMIDE 10 MG/ML (PF) SYRINGE
PREFILLED_SYRINGE | INTRAVENOUS | Status: DC | PRN
  Administered 2022-01-28: 70 mg via INTRAVENOUS
  Administered 2022-01-28: 20 mg via INTRAVENOUS
  Administered 2022-01-28 (×2): 40 mg via INTRAVENOUS
  Administered 2022-01-28: 30 mg via INTRAVENOUS

## 2022-01-28 MED ORDER — METOPROLOL TARTRATE 5 MG/5ML IV SOLN: 2.0000 mg | INTRAVENOUS | Status: DC | PRN

## 2022-01-28 MED ORDER — DEXAMETHASONE SODIUM PHOSPHATE 10 MG/ML IJ SOLN
INTRAMUSCULAR | Status: DC | PRN
  Administered 2022-01-28: 10 mg via INTRAVENOUS

## 2022-01-28 MED ORDER — ESMOLOL HCL 100 MG/10ML IV SOLN
INTRAVENOUS | Status: AC
  Filled 2022-01-28: qty 10

## 2022-01-28 MED ORDER — ONDANSETRON HCL 4 MG/2ML IJ SOLN: 4.0000 mg | Freq: Four times a day (QID) | INTRAMUSCULAR | Status: DC | PRN

## 2022-01-28 MED ORDER — HEPARIN 6000 UNIT IRRIGATION SOLUTION
Status: DC | PRN
  Administered 2022-01-28: 1

## 2022-01-28 MED ORDER — PANTOPRAZOLE SODIUM 40 MG PO TBEC
40.0000 mg | DELAYED_RELEASE_TABLET | Freq: Every day | ORAL | Status: DC
  Administered 2022-01-28 – 2022-01-30 (×3): 40 mg via ORAL
  Filled 2022-01-28 (×3): qty 1

## 2022-01-28 MED ORDER — ALUM & MAG HYDROXIDE-SIMETH 200-200-20 MG/5ML PO SUSP: 15.0000 mL | ORAL | Status: DC | PRN

## 2022-01-28 MED ORDER — ONDANSETRON HCL 4 MG/2ML IJ SOLN
INTRAMUSCULAR | Status: DC | PRN
  Administered 2022-01-28: 4 mg via INTRAVENOUS

## 2022-01-28 MED ORDER — POTASSIUM CHLORIDE CRYS ER 20 MEQ PO TBCR: 20.0000 meq | EXTENDED_RELEASE_TABLET | Freq: Every day | ORAL | Status: DC | PRN

## 2022-01-28 MED ORDER — ETOMIDATE 2 MG/ML IV SOLN
INTRAVENOUS | Status: AC
  Filled 2022-01-28: qty 10

## 2022-01-28 MED ORDER — ACETAMINOPHEN 500 MG PO TABS
1000.0000 mg | ORAL_TABLET | Freq: Once | ORAL | Status: AC
  Administered 2022-01-28: 1000 mg via ORAL
  Filled 2022-01-28: qty 2

## 2022-01-28 MED ORDER — FENTANYL CITRATE (PF) 250 MCG/5ML IJ SOLN
INTRAMUSCULAR | Status: DC | PRN
  Administered 2022-01-28: 50 ug via INTRAVENOUS
  Administered 2022-01-28: 100 ug via INTRAVENOUS
  Administered 2022-01-28 (×4): 50 ug via INTRAVENOUS

## 2022-01-28 MED ORDER — HEPARIN SODIUM (PORCINE) 1000 UNIT/ML IJ SOLN
INTRAMUSCULAR | Status: DC | PRN
  Administered 2022-01-28: 5000 [IU] via INTRAVENOUS
  Administered 2022-01-28: 10000 [IU] via INTRAVENOUS

## 2022-01-28 MED ORDER — PHENYLEPHRINE 80 MCG/ML (10ML) SYRINGE FOR IV PUSH (FOR BLOOD PRESSURE SUPPORT)
PREFILLED_SYRINGE | INTRAVENOUS | Status: DC | PRN
  Administered 2022-01-28 (×7): 80 ug via INTRAVENOUS

## 2022-01-28 MED ORDER — ACETAMINOPHEN 325 MG PO TABS: 325.0000 mg | ORAL_TABLET | ORAL | Status: DC | PRN

## 2022-01-28 MED ORDER — FENTANYL CITRATE (PF) 250 MCG/5ML IJ SOLN
INTRAMUSCULAR | Status: AC
  Filled 2022-01-28: qty 5

## 2022-01-28 MED ORDER — AMISULPRIDE (ANTIEMETIC) 5 MG/2ML IV SOLN: 5.0000 mg | Freq: Once | INTRAVENOUS | Status: DC | PRN

## 2022-01-28 MED ORDER — MORPHINE SULFATE (PF) 2 MG/ML IV SOLN: 2.0000 mg | INTRAVENOUS | Status: DC | PRN

## 2022-01-28 MED ORDER — CEFAZOLIN SODIUM-DEXTROSE 2-4 GM/100ML-% IV SOLN
2.0000 g | INTRAVENOUS | Status: AC
  Administered 2022-01-28 (×2): 2 g via INTRAVENOUS
  Filled 2022-01-28: qty 100

## 2022-01-28 MED ORDER — HYDROMORPHONE HCL 1 MG/ML IJ SOLN
0.2500 mg | INTRAMUSCULAR | Status: DC | PRN
  Administered 2022-01-28 (×2): 0.5 mg via INTRAVENOUS

## 2022-01-28 MED ORDER — PROPOFOL 10 MG/ML IV BOLUS
INTRAVENOUS | Status: DC | PRN
  Administered 2022-01-28: 100 mg via INTRAVENOUS

## 2022-01-28 MED ORDER — HYDRALAZINE HCL 20 MG/ML IJ SOLN: 5.0000 mg | INTRAMUSCULAR | Status: DC | PRN

## 2022-01-28 MED ORDER — CEFAZOLIN SODIUM-DEXTROSE 2-4 GM/100ML-% IV SOLN
2.0000 g | Freq: Three times a day (TID) | INTRAVENOUS | Status: AC
  Administered 2022-01-28 – 2022-01-29 (×2): 2 g via INTRAVENOUS
  Filled 2022-01-28 (×2): qty 100

## 2022-01-28 MED ORDER — HEMOSTATIC AGENTS (NO CHARGE) OPTIME
TOPICAL | Status: DC | PRN
  Administered 2022-01-28 (×2): 1 via TOPICAL

## 2022-01-28 MED ORDER — 0.9 % SODIUM CHLORIDE (POUR BTL) OPTIME
TOPICAL | Status: DC | PRN
  Administered 2022-01-28 (×2): 1000 mL

## 2022-01-28 MED ORDER — HYDROMORPHONE HCL 1 MG/ML IJ SOLN
INTRAMUSCULAR | Status: AC
  Filled 2022-01-28: qty 1

## 2022-01-28 MED ORDER — ACETAMINOPHEN 650 MG RE SUPP: 325.0000 mg | RECTAL | Status: DC | PRN

## 2022-01-28 MED ORDER — ALBUMIN HUMAN 5 % IV SOLN: INTRAVENOUS | Status: DC | PRN

## 2022-01-28 MED ORDER — MAGNESIUM SULFATE 2 GM/50ML IV SOLN: 2.0000 g | Freq: Every day | INTRAVENOUS | Status: DC | PRN

## 2022-01-28 MED ORDER — ORAL CARE MOUTH RINSE: 15.0000 mL | Freq: Once | OROMUCOSAL | Status: AC

## 2022-01-28 MED ORDER — SUGAMMADEX SODIUM 200 MG/2ML IV SOLN
INTRAVENOUS | Status: DC | PRN
  Administered 2022-01-28: 200 mg via INTRAVENOUS

## 2022-01-28 MED ORDER — CHLORHEXIDINE GLUCONATE 0.12 % MT SOLN
OROMUCOSAL | Status: AC
  Filled 2022-01-28: qty 15

## 2022-01-28 MED ORDER — PHENOL 1.4 % MT LIQD: 1.0000 | OROMUCOSAL | Status: DC | PRN

## 2022-01-28 MED ORDER — GUAIFENESIN-DM 100-10 MG/5ML PO SYRP: 15.0000 mL | ORAL_SOLUTION | ORAL | Status: DC | PRN

## 2022-01-28 MED ORDER — CHLORHEXIDINE GLUCONATE 0.12 % MT SOLN: 15.0000 mL | Freq: Once | OROMUCOSAL | Status: AC

## 2022-01-28 MED ORDER — DEXMEDETOMIDINE HCL IN NACL 80 MCG/20ML IV SOLN
INTRAVENOUS | Status: AC
  Filled 2022-01-28: qty 20

## 2022-01-28 MED ORDER — PROPOFOL 10 MG/ML IV BOLUS
INTRAVENOUS | Status: AC
  Filled 2022-01-28: qty 20

## 2022-01-28 MED ORDER — SODIUM CHLORIDE 0.9 % IV SOLN: INTRAVENOUS | Status: AC

## 2022-01-28 MED ORDER — PHENYLEPHRINE HCL-NACL 20-0.9 MG/250ML-% IV SOLN
INTRAVENOUS | Status: DC | PRN
  Administered 2022-01-28: 25 ug/min via INTRAVENOUS

## 2022-01-28 MED ORDER — HEPARIN SODIUM (PORCINE) 5000 UNIT/ML IJ SOLN
5000.0000 [IU] | Freq: Three times a day (TID) | INTRAMUSCULAR | Status: DC
  Administered 2022-01-29 – 2022-01-30 (×4): 5000 [IU] via SUBCUTANEOUS
  Filled 2022-01-28 (×4): qty 1

## 2022-01-28 MED ORDER — SODIUM CHLORIDE 0.9 % IV SOLN: 500.0000 mL | Freq: Once | INTRAVENOUS | Status: DC | PRN

## 2022-01-28 MED ORDER — STERILE WATER FOR IRRIGATION IR SOLN
Status: DC | PRN
  Administered 2022-01-28: 1000 mL

## 2022-01-28 MED ORDER — LABETALOL HCL 5 MG/ML IV SOLN: 10.0000 mg | INTRAVENOUS | Status: DC | PRN

## 2022-01-28 MED ORDER — HEPARIN 6000 UNIT IRRIGATION SOLUTION
Status: AC
  Filled 2022-01-28: qty 500

## 2022-01-28 MED ORDER — DOCUSATE SODIUM 100 MG PO CAPS
100.0000 mg | ORAL_CAPSULE | Freq: Every day | ORAL | Status: DC
  Administered 2022-01-29 – 2022-01-30 (×2): 100 mg via ORAL
  Filled 2022-01-28 (×2): qty 1

## 2022-01-28 MED ORDER — OXYCODONE-ACETAMINOPHEN 5-325 MG PO TABS: 1.0000 | ORAL_TABLET | ORAL | Status: DC | PRN

## 2022-01-28 MED ORDER — LIDOCAINE 2% (20 MG/ML) 5 ML SYRINGE
INTRAMUSCULAR | Status: DC | PRN
  Administered 2022-01-28: 80 mg via INTRAVENOUS

## 2022-01-28 MED ORDER — PROTAMINE SULFATE 10 MG/ML IV SOLN
INTRAVENOUS | Status: DC | PRN
  Administered 2022-01-28 (×2): 20 mg via INTRAVENOUS
  Administered 2022-01-28: 10 mg via INTRAVENOUS

## 2022-01-28 SURGICAL SUPPLY — 43 items
ADH SKN CLS APL DERMABOND .7 (GAUZE/BANDAGES/DRESSINGS) ×6
CANISTER SUCT 3000ML PPV (MISCELLANEOUS) ×2 IMPLANT
CANNULA VESSEL 3MM 2 BLNT TIP (CANNULA) IMPLANT
CLIP LIGATING EXTRA MED SLVR (CLIP) ×2 IMPLANT
CLIP LIGATING EXTRA SM BLUE (MISCELLANEOUS) ×2 IMPLANT
COVER MAYO STAND STRL (DRAPES) IMPLANT
COVER PROBE W GEL 5X96 (DRAPES) IMPLANT
DERMABOND ADVANCED .7 DNX12 (GAUZE/BANDAGES/DRESSINGS) ×2 IMPLANT
DRAPE C-ARM 42X72 X-RAY (DRAPES) IMPLANT
DRAPE HALF SHEET 40X57 (DRAPES) IMPLANT
ELECT REM PT RETURN 9FT ADLT (ELECTROSURGICAL) ×2
ELECTRODE REM PT RTRN 9FT ADLT (ELECTROSURGICAL) ×2 IMPLANT
GAUZE 4X4 16PLY ~~LOC~~+RFID DBL (SPONGE) IMPLANT
GLOVE BIO SURGEON STRL SZ7.5 (GLOVE) ×2 IMPLANT
GOWN STRL REUS W/ TWL LRG LVL3 (GOWN DISPOSABLE) ×4 IMPLANT
GOWN STRL REUS W/ TWL XL LVL3 (GOWN DISPOSABLE) ×2 IMPLANT
GOWN STRL REUS W/TWL LRG LVL3 (GOWN DISPOSABLE) ×4
GOWN STRL REUS W/TWL XL LVL3 (GOWN DISPOSABLE) ×2
GRAFT PROPATEN W/RING 6X80X60 (Vascular Products) IMPLANT
HEMOSTAT SNOW SURGICEL 2X4 (HEMOSTASIS) IMPLANT
IMMOBILIZER KNEE 22 (SOFTGOODS) IMPLANT
INSERT FOGARTY SM (MISCELLANEOUS) IMPLANT
KIT BASIN OR (CUSTOM PROCEDURE TRAY) ×2 IMPLANT
KIT TURNOVER KIT B (KITS) ×2 IMPLANT
NS IRRIG 1000ML POUR BTL (IV SOLUTION) ×4 IMPLANT
PACK PERIPHERAL VASCULAR (CUSTOM PROCEDURE TRAY) ×2 IMPLANT
PAD ARMBOARD 7.5X6 YLW CONV (MISCELLANEOUS) ×4 IMPLANT
POWDER SURGICEL 3.0 GRAM (HEMOSTASIS) IMPLANT
SPONGE T-LAP 18X18 ~~LOC~~+RFID (SPONGE) IMPLANT
SUT MNCRL AB 4-0 PS2 18 (SUTURE) ×4 IMPLANT
SUT PROLENE 5 0 C 1 24 (SUTURE) ×2 IMPLANT
SUT PROLENE 6 0 BV (SUTURE) ×2 IMPLANT
SUT SILK 2 0 SH (SUTURE) ×2 IMPLANT
SUT SILK 3 0 (SUTURE)
SUT SILK 3-0 18XBRD TIE 12 (SUTURE) IMPLANT
SUT VIC AB 2-0 CT1 27 (SUTURE) ×6
SUT VIC AB 2-0 CT1 TAPERPNT 27 (SUTURE) ×4 IMPLANT
SUT VIC AB 3-0 SH 27 (SUTURE) ×8
SUT VIC AB 3-0 SH 27X BRD (SUTURE) ×4 IMPLANT
TOWEL GREEN STERILE (TOWEL DISPOSABLE) ×2 IMPLANT
TRAY FOLEY MTR SLVR 16FR STAT (SET/KITS/TRAYS/PACK) ×2 IMPLANT
UNDERPAD 30X36 HEAVY ABSORB (UNDERPADS AND DIAPERS) ×2 IMPLANT
WATER STERILE IRR 1000ML POUR (IV SOLUTION) ×2 IMPLANT

## 2022-01-28 NOTE — Progress Notes (Signed)
Progress Note    01/28/2022 9:52 AM Day of Surgery  Subjective:  no overnight issues  Vitals:   01/28/22 0757 01/28/22 0905  BP: 109/67 113/66  Pulse: 83 65  Resp: 16 18  Temp: 98.2 F (36.8 C) 97.9 F (36.6 C)  SpO2: 94% 94%    Physical Exam: Aaox3 Non labored respirations Left groin is soft without hematoma Stable left great toe wound  CBC    Component Value Date/Time   WBC 9.1 01/28/2022 0353   RBC 3.96 (L) 01/28/2022 0353   HGB 11.7 (L) 01/28/2022 0353   HGB 12.7 (L) 11/27/2020 1523   HCT 34.8 (L) 01/28/2022 0353   HCT 37.6 11/27/2020 1523   PLT 212 01/28/2022 0353   PLT 279 11/27/2020 1523   MCV 87.9 01/28/2022 0353   MCV 89 11/27/2020 1523   MCH 29.5 01/28/2022 0353   MCHC 33.6 01/28/2022 0353   RDW 13.7 01/28/2022 0353   RDW 13.3 11/27/2020 1523   LYMPHSABS 2.3 12/16/2021 1050   LYMPHSABS 2.2 04/08/2017 0824   MONOABS 0.8 12/16/2021 1050   EOSABS 0.2 12/16/2021 1050   EOSABS 0.3 04/08/2017 0824   BASOSABS 0.1 12/16/2021 1050   BASOSABS 0.0 04/08/2017 0824    BMET    Component Value Date/Time   NA 138 01/28/2022 0353   NA 143 11/27/2020 1523   K 3.5 01/28/2022 0353   CL 108 01/28/2022 0353   CO2 23 01/28/2022 0353   GLUCOSE 129 (H) 01/28/2022 0353   BUN 12 01/28/2022 0353   BUN 12 11/27/2020 1523   CREATININE 0.84 01/28/2022 0353   CREATININE 0.71 07/25/2016 0934   CALCIUM 8.3 (L) 01/28/2022 0353   GFRNONAA >60 01/28/2022 0353   GFRNONAA >89 07/25/2016 0934   GFRAA 104 06/26/2020 1009   GFRAA >89 07/25/2016 0934    INR    Component Value Date/Time   INR 1.2 01/28/2022 0353     Intake/Output Summary (Last 24 hours) at 01/28/2022 0952 Last data filed at 01/28/2022 0434 Gross per 24 hour  Intake 130.67 ml  Output 670 ml  Net -539.33 ml   +---------------+-----------+----------------------+---------------+-------  ----+    RT Diameter  RT Findings         GSV            LT Diameter  LT  Findings       (cm)                                             (cm)                    +---------------+-----------+----------------------+---------------+-------  ----+                                Saphenofemoral         0.25                                                     Junction                                    +---------------+-----------+----------------------+---------------+-------  ----+  Proximal thigh         0.27                    +---------------+-----------+----------------------+---------------+-------  ----+                                  Mid thigh            0.27                    +---------------+-----------+----------------------+---------------+-------  ----+                                 Distal thigh          0.44                    +---------------+-----------+----------------------+---------------+-------  ----+                                     Knee              0.15        branching   +---------------+-----------+----------------------+---------------+-------  ----+                                  Prox calf            0.27                    +---------------+-----------+----------------------+---------------+-------  ----+                                   Mid calf            0.29                    +---------------+-----------+----------------------+---------------+-------  ----+                                 Distal calf           0.35                    +---------------+-----------+----------------------+---------------+-------  ----+                                    Ankle              0.30                    +---------------+-----------+----------------------+---------------+-------  ----+   +----------------+-----------+---------------+----------------+-----------+   RT diameter (cm)RT Findings      SSV      LT Diameter (cm)LT  Findings   +----------------+-----------+---------------+----------------+-----------+                              Popliteal fossa      0.15                    +----------------+-----------+---------------+----------------+-----------+  Proximal calf       0.16                    +----------------+-----------+---------------+----------------+-----------+                                 Mid calf          0.21                    +----------------+-----------+---------------+----------------+-----------+                                Distal calf        0.23                    +----------------+-----------+---------------+----------------+-----------+    Assessment:  81 y.o. male is here with great toe wound  Plan: OR today for left lower extremity bypass   Nicklaus Alviar C. Randie Heinz, MD [facility] [employer] Office: [phone] Pager: [phone]  01/28/2022 9:52 AM

## 2022-01-28 NOTE — Progress Notes (Signed)
Pre-surgical report called to [facility] in Short Stay.  Advised to give patient Asprin and Metoprolol.  Second CHG given / CBG taken.  Jewelry (ring and watch) removed and placed in room.  Pre-operative checklist completed.

## 2022-01-28 NOTE — Op Note (Signed)
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 Randie Heinz, MD Assistant: Aggie Moats, PA Procedure Performed: 1.  Left common femoral, superficial femoral and profundofemoral endarterectomy with vein patch angioplasty 2.  Left popliteal, tibioperoneal trunk endarterectomy with vein patch angioplasty 3.  Left common femoral to below-knee popliteal artery bypass with 6 mm ringed PTFE 4.  Harvest of left greater saphenous vein in the mid thigh and below the knee   Indications: 81 year old male with chronic left lower extremity limb threatening ischemia with tissue loss of the left lower extremity found to have occluded left common femoral artery with long segment occlusion of the superficial femoral and popliteal arteries with reconstitution of the anterior tibial artery as well as the peroneal artery.  We have plan for endarterectomy of the common femoral and below the knee with bypass likely with PTFE given lack of vein on recent vein mapping.  Findings: Preoperative ultrasound evaluation demonstrated suitable vein in the mid thigh with smaller vein below the knee was not going to be suitable for bypass.  The common femoral artery was subtotally occluded with heavy calcification the profunda I was able to get some backbleeding there was a medial circumflex branch that did have very strong backbleeding at completion after endarterectomy and there is very strong inflow from what was a very soft external iliac artery and a vein patch angioplasty was performed.  Below the knee the popliteal artery and tibioperoneal trunk were occluded I did establish good backbleeding from an anterior tibial as well as peroneal arteries and placed a vein patch angioplasty and performed bypass.  At completion there was a very strong  signal at the ankle and the anterior tibial artery that could be traced onto the foot to the dorsalis pedis and this was graft dependent.   Procedure:  The patient was identified in the holding area and taken to the operating room where is placed supine operative table and general anesthesia was induced.  He was sterilely prepped draped in left lower extremity usual fashion, antibiotics were minister timeout was called.  I began using ultrasound to identify the vein throughout his lower extremity on the left side unfortunately did not appear to have suitable vein for bypass.  We then made a vertical incision in the groin dissected out of the heavily calcified common femoral artery up under the inguinal ligament the external iliac artery was much softer we encircled this with vessel loop as well as 2 side branches with Vesseloops.  We dissected down to the profunda medial circumflex and placed branches and these were encircled the SFA and he was also identified and dissected free although was occluded I did not encircled this with vessel loop.  Return my attention distally made a standard incision below the knee for popliteal exposure.  Through this incision we dissected out was quite a small vein for approximately 10 cm tied this off distally and proximally and transected it branches were divided between clips and ties and we passed the soft table as a specimen.  We then dissected through the deep fascia down to the popliteal artery.  We divided multiple veins between ties.  The anterior tibial artery was encircled.  We then dissected down to the tibioperoneal trunk bifurcation and the posterior tibial and peroneal arteries were dissected out for several centimeters and encircled with Vesseloops.  I  then placed a tunneler between the below-knee and the above-knee incision.  In the mid thigh identified a larger segment of vein and I dissected down to this with a vertical incision and harvested approximately 10 cm  of this vein.  The patient was then fully heparinized.  After heparin circulated for 3 minutes we clamped the outflow followed by the inflow of the common femoral artery and performed a longitudinal arteriotomy with extensive endarterectomy extending up to the external leg artery including the medial circumflex, profunda branches and SFA.  I then fashioned a part of the vein and sewed this in place as a patch with 5-0 Prolene suture.  Prior to completion we flushed in all directions.  On completion there was good flow into the medial circumflex branch there is high resistance into the SFA and profunda branch but there was very strong inflow.  I then tunneled the PTFE graft trimmed this to size and sewed this in place after reclamped and the vessels and opening the patch was sewn in place with a 5-0 Prolene suture.  Upon completion we then flushed through the graft itself and then flushed the graft with heparinized saline and it was clamped.  I turned my attention distally.  I clamped the posterior tibial, peroneal arteries and anterior tibial arteries and opened from the peroneal artery back onto the popliteal artery up past the anterior tibial and performed extensive endarterectomy and had good backbleeding from the anterior tibial and the peroneal arteries with minimal backbleeding from the posterior tibial consistent with a preoperative angiogram.  I then used the rest the vein and sewed this in place as a patch after spatulated the this was done with 6-0 Prolene suture.  Prior to completion we flushed in all directions.  Upon completion there was some good backbleeding from the anterior tibial and peroneal arteries.  These were reclamped.  We then opened the patch angioplasty vertically straighten the leg from the graft to size and sewed this in place with 6-0 Prolene suture.  Prior completion again we allowed flushing all directions.  Upon completion there was very strong signal at the anterior tibial at the  ankle that could be traced onto the foot and this was graft dependent.  We administered 50 mg of protamine obtain hemostasis in all her wounds and they were closed in layers with Vicryl and Monocryl.  Dermabond is placed to the level the skin.  Patient was then awakened from anesthesia having tolerated procedure without immediate complication.  All counts were correct at completion.   Given the complexity of the case,  the assistant was necessary in order to expedient the procedure and safely perform the technical aspects of the operation.  The assistant provided traction and countertraction to assist with exposure of the artery proximally and distally.  They assisted with suture ligature of multiple branches.  Their assistance was critical in the performance of both the proximal and distal anastomosis.These skills, especially following the Prolene suture for the anastomosis, could not have been adequately performed by a scrub tech assistant.    EBL: 900cc  Sila Sarsfield C. Randie Heinz, MD [facility] [place] Office: [phone] Pager: [phone]

## 2022-01-28 NOTE — Progress Notes (Signed)
Mobility Specialist Progress Note:   01/28/22 0900  Mobility  Activity Off unit   Will follow-up as time allows.   [facility] Air cabin crew Chat only

## 2022-01-28 NOTE — Progress Notes (Signed)
Patient arrived from the PACU.  VSS and CCMD notified.

## 2022-01-28 NOTE — Anesthesia Procedure Notes (Signed)
Procedure Name: Intubation Date/Time: 01/28/2022 11:09 AM  Performed by: Carolan Clines, CRNAPre-anesthesia Checklist: Patient identified, Emergency Drugs available, Suction available and Patient being monitored Patient Re-evaluated:Patient Re-evaluated prior to induction Oxygen Delivery Method: Circle System Utilized Preoxygenation: Pre-oxygenation with 100% oxygen Induction Type: IV induction Ventilation: Mask ventilation without difficulty and Oral airway inserted - appropriate to patient size Laryngoscope Size: Mac and 4 Grade View: Grade I Tube type: Oral Tube size: 7.5 mm Number of attempts: 1 Airway Equipment and Method: Stylet and Oral airway Placement Confirmation: ETT inserted through vocal cords under direct vision, positive ETCO2 and breath sounds checked- equal and bilateral Secured at: 22 cm Tube secured with: Tape Dental Injury: Teeth and Oropharynx as per pre-operative assessment

## 2022-01-28 NOTE — Anesthesia Procedure Notes (Signed)
Arterial Line Insertion Start/End9/04/2022 9:38 AM, 01/28/2022 9:40 AM Performed by: Waynard Edwards, CRNA, CRNA  Patient location: Pre-op. Preanesthetic checklist: patient identified, IV checked, site marked, risks and benefits discussed, surgical consent, monitors and equipment checked, pre-op evaluation, timeout performed and anesthesia consent Lidocaine 1% used for infiltration Right, radial was placed Catheter size: 20 G Hand hygiene performed  and maximum sterile barriers used  Allen's test indicative of satisfactory collateral circulation Attempts: 1 Procedure performed without using ultrasound guided technique. Following insertion, dressing applied and Biopatch. Post procedure assessment: normal and unchanged  Patient tolerated the procedure well with no immediate complications.

## 2022-01-28 NOTE — Transfer of Care (Signed)
Immediate Anesthesia Transfer of Care Note  Patient: [name]  Procedure(s) Performed: LEFT FEMORAL-POPLITEAL ARTERY BYPASS (Left: Leg Upper) LEFT ENDARTERECTOMYCOMMON  FEMORALAND BELOW KNEE POPLITEAL (Left: Groin)  Patient Location: PACU  Anesthesia Type:General  Level of Consciousness: awake and alert   Airway & Oxygen Therapy: Patient Spontanous Breathing and Patient connected to face mask oxygen  Post-op Assessment: Report given to RN and Post -op Vital signs reviewed and stable  Post vital signs: Reviewed and stable  Last Vitals:  Vitals Value Taken Time  BP    Temp    Pulse 78 01/28/22 1553  Resp 20 01/28/22 1557  SpO2 96 % 01/28/22 1553  Vitals shown include unvalidated device data.  Last Pain:  Vitals:   01/28/22 0922  TempSrc:   PainSc: 0-No pain         Complications: No notable events documented.

## 2022-01-28 NOTE — Anesthesia Postprocedure Evaluation (Signed)
Anesthesia Post Note  Patient: Basim Bartnik  Procedure(s) Performed: LEFT FEMORAL-POPLITEAL ARTERY BYPASS (Left: Leg Upper) LEFT ENDARTERECTOMYCOMMON  FEMORALAND BELOW KNEE POPLITEAL (Left: Groin)     Patient location during evaluation: PACU Anesthesia Type: General Level of consciousness: sedated and patient cooperative Pain management: pain level controlled Vital Signs Assessment: post-procedure vital signs reviewed and stable Respiratory status: spontaneous breathing Cardiovascular status: stable Anesthetic complications: no   No notable events documented.  Last Vitals:  Vitals:   01/28/22 1700 01/28/22 1732  BP: 103/65 107/65  Pulse: 85   Resp: 14   Temp: 37 C 36.6 C  SpO2: 95%     Last Pain:  Vitals:   01/28/22 1732  TempSrc: Oral  PainSc: 0-No pain                 Lewie Loron

## 2022-01-29 ENCOUNTER — Other Ambulatory Visit: Payer: Self-pay

## 2022-01-29 ENCOUNTER — Encounter (HOSPITAL_COMMUNITY): Payer: Self-pay | Admitting: Vascular Surgery

## 2022-01-29 LAB — BASIC METABOLIC PANEL
Anion gap: 8 (ref 5–15)
BUN: 9 mg/dL (ref 8–23)
CO2: 24 mmol/L (ref 22–32)
Calcium: 7.7 mg/dL — ABNORMAL LOW (ref 8.9–10.3)
Chloride: 107 mmol/L (ref 98–111)
Creatinine, Ser: 0.65 mg/dL (ref 0.61–1.24)
GFR, Estimated: >60 mL/min (ref >60)
Glucose, Bld: 154 mg/dL — ABNORMAL HIGH (ref 70–99)
Potassium: 3.7 mmol/L (ref 3.5–5.1)
Sodium: 139 mmol/L (ref 135–145)

## 2022-01-29 LAB — CBC
HCT: 27.7 % — ABNORMAL LOW (ref 39.0–52.0)
Hemoglobin: 9.2 g/dL — ABNORMAL LOW (ref 13.0–17.0)
MCH: 29.3 pg (ref 26.0–34.0)
MCHC: 33.2 g/dL (ref 30.0–36.0)
MCV: 88.2 fL (ref 80.0–100.0)
Platelets: 198 10*3/uL (ref 150–400)
RBC: 3.14 MIL/uL — ABNORMAL LOW (ref 4.22–5.81)
RDW: 13.8 % (ref 11.5–15.5)
WBC: 9.4 10*3/uL (ref 4.0–10.5)
nRBC: 0 % (ref 0.0–0.2)

## 2022-01-29 NOTE — Evaluation (Addendum)
Physical Therapy Evaluation Patient Details Name: [name] MRN: [id number] DOB: 01/29/1941 Today's Date: 01/29/2022  History of Present Illness  Pt is a 81 y.o. male admitted 01/27/22 for same day aortogram, LLE angiography. S/p L femoral-popliteal artery bypass, L femoral and below knee popliteal endarterectomy on 9/12. PMH includes CAD, DM, HTN, PVD, OA.   Clinical Impression  Patient evaluated by Physical Therapy with no further acute PT needs identified. PTA, pt independent without DME, lives with supportive wife, retired from work; wife assists with ADL/iADLs as needed. Today, pt progressed to ambulating without DME at supervision-level. Wife able to provide necessary support upon return home. All education has been completed and the patient has no further questions. Acute PT is signing off. Thank you for this referral.     Recommendations for follow up therapy are one component of a multi-disciplinary discharge planning process, led by the attending physician.  Recommendations may be updated based on patient status, additional functional criteria and insurance authorization.  Follow Up Recommendations No PT follow up      Assistance Recommended at Discharge PRN  Patient can return home with the following  A little help with bathing/dressing/bathroom;Assist for transportation;Assistance with cooking/housework    Equipment Recommendations None recommended by PT  Recommendations for Other Services   Mobility Specialist          Precautions / Restrictions Precautions Precautions: Fall Restrictions Weight Bearing Restrictions: No      Mobility  Bed Mobility Overal bed mobility: Needs Assistance Bed Mobility: Supine to Sit     Supine to sit: Min assist, HOB elevated     General bed mobility comments: MinA for HHA to elevate trunk    Transfers Overall transfer level: Needs assistance Equipment used: None, Rolling walker (2 wheels) Transfers: Sit to/from Stand Sit  to Stand: Min guard, Supervision                Ambulation/Gait Ambulation/Gait assistance: Supervision Gait Distance (Feet): 180 Feet Assistive device: Rolling walker (2 wheels), None Gait Pattern/deviations: Step-through pattern, Decreased stride length, Antalgic Gait velocity: Decreased     General Gait Details: slow, antalgic gait initially with RW progressing to no DME; supervision for lines/safety; good ability to WBAT through LLE  Stairs Stairs:  (pt declined; educ on technique with LLE pain, use of rails)          Wheelchair Mobility    Modified Rankin (Stroke Patients Only)       Balance Overall balance assessment: Needs assistance Sitting-balance support: No upper extremity supported, Feet supported Sitting balance-Leahy Scale: Good     Standing balance support: No upper extremity supported, During functional activity Standing balance-Leahy Scale: Fair                               Pertinent Vitals/Pain Pain Assessment Pain Assessment: Faces Faces Pain Scale: Hurts little more Pain Location: LLE incisions (especially upper thigh) Pain Descriptors / Indicators: Discomfort, Grimacing, Guarding Pain Intervention(s): Monitored during session    Home Living Family/patient expects to be discharged to:: Private residence Living Arrangements: Spouse/significant other Available Help at Discharge: Family;Available 24 hours/day Type of Home: House Home Access: Stairs to enter Entrance Stairs-Rails: Lawyer of Steps: 4   Home Layout: One level Home Equipment: Agricultural consultant (2 wheels);BSC/3in1;Grab bars - tub/shower Additional Comments: pt and wife live with dad who has broken hip, DME for him, also has private caregivers. Wife with flexible job and  available to assist as much as needed    Prior Function Prior Level of Function : Independent/Modified Independent;Driving             Mobility Comments:  Independent without DME, drives, retired from work       Higher education careers adviser        Extremity/Trunk Assessment   Upper Extremity Assessment Upper Extremity Assessment: Overall WFL for tasks assessed    Lower Extremity Assessment Lower Extremity Assessment: LLE deficits/detail LLE Deficits / Details: s/p LLE revascularization with expected post op pain and weakness; functional strength >3/5 with good hip and knee AROM       Communication   Communication: No difficulties  Cognition Arousal/Alertness: Awake/alert Behavior During Therapy: WFL for tasks assessed/performed Overall Cognitive Status: Within Functional Limits for tasks assessed                                          General Comments General comments (skin integrity, edema, etc.): pt's wife [name]) present and supportive, able to provide necessary assist during session    Exercises     Assessment/Plan    PT Assessment Patient does not need any further PT services  PT Problem List         PT Treatment Interventions      PT Goals (Current goals can be found in the Care Plan section)  Acute Rehab PT Goals PT Goal Formulation: All assessment and education complete, DC therapy    Frequency       Co-evaluation               AM-PAC PT "6 Clicks" Mobility  Outcome Measure Help needed turning from your back to your side while in a flat bed without using bedrails?: None Help needed moving from lying on your back to sitting on the side of a flat bed without using bedrails?: None Help needed moving to and from a bed to a chair (including a wheelchair)?: A Little Help needed standing up from a chair using your arms (e.g., wheelchair or bedside chair)?: A Little Help needed to walk in hospital room?: A Little Help needed climbing 3-5 steps with a railing? : A Little 6 Click Score: 20    End of Session Equipment Utilized During Treatment: Gait belt Activity Tolerance: Patient tolerated  treatment well Patient left: in chair;with call bell/phone within reach;with family/visitor present Nurse Communication: Mobility status PT Visit Diagnosis: Other abnormalities of gait and mobility (R26.89);Pain    Time: 0801-0819 PT Time Calculation (min) (ACUTE ONLY): 18 min   Charges:   PT Evaluation $PT Eval Low Complexity: 1 Low        Ina Homes, PT, DPT Acute Rehabilitation Services  Personal: Secure Chat Rehab Office: [phone]  Malachy Chamber 01/29/2022, 9:28 AM

## 2022-01-29 NOTE — Progress Notes (Addendum)
Progress Note    01/29/2022 9:21 AM 1 Day Post-Op  Subjective:  ambulated in hall this morning with PT. Sitting up in chair when I entered room. Overall feeling good. Some soreness mostly in left groin incision. Does report several episodes of chest pain last night and this morning. This is not new has been going on for 1 year   Vitals:   01/29/22 0419 01/29/22 0803  BP: (!) 158/78 118/72  Pulse: 87 97  Resp: 18 11  Temp: 98 F (36.7 C) 97.7 F (36.5 C)  SpO2: 100% 97%   Physical Exam: Cardiac: regular Lungs:  non labored Incisions:  left groin and left leg incisions are intact and well appearing without swelling or hematoma Extremities:  left leg well perfused and warm with brisk Doppler PT signal  Neurologic: alert and oriented   CBC    Component Value Date/Time   WBC 9.4 01/29/2022 0412   RBC 3.14 (L) 01/29/2022 0412   HGB 9.2 (L) 01/29/2022 0412   HGB 12.7 (L) 11/27/2020 1523   HCT 27.7 (L) 01/29/2022 0412   HCT 37.6 11/27/2020 1523   PLT 198 01/29/2022 0412   PLT 279 11/27/2020 1523   MCV 88.2 01/29/2022 0412   MCV 89 11/27/2020 1523   MCH 29.3 01/29/2022 0412   MCHC 33.2 01/29/2022 0412   RDW 13.8 01/29/2022 0412   RDW 13.3 11/27/2020 1523   LYMPHSABS 2.3 12/16/2021 1050   LYMPHSABS 2.2 04/08/2017 0824   MONOABS 0.8 12/16/2021 1050   EOSABS 0.2 12/16/2021 1050   EOSABS 0.3 04/08/2017 0824   BASOSABS 0.1 12/16/2021 1050   BASOSABS 0.0 04/08/2017 0824    BMET    Component Value Date/Time   NA 139 01/29/2022 0412   NA 143 11/27/2020 1523   K 3.7 01/29/2022 0412   CL 107 01/29/2022 0412   CO2 24 01/29/2022 0412   GLUCOSE 154 (H) 01/29/2022 0412   BUN 9 01/29/2022 0412   BUN 12 11/27/2020 1523   CREATININE 0.65 01/29/2022 0412   CREATININE 0.71 07/25/2016 0934   CALCIUM 7.7 (L) 01/29/2022 0412   GFRNONAA >60 01/29/2022 0412   GFRNONAA >89 07/25/2016 0934   GFRAA 104 06/26/2020 1009   GFRAA >89 07/25/2016 0934    INR    Component Value  Date/Time   INR 1.2 01/28/2022 0353     Intake/Output Summary (Last 24 hours) at 01/29/2022 0921 Last data filed at 01/29/2022 0630 Gross per 24 hour  Intake 3090 ml  Output 2080 ml  Net 1010 ml     Assessment/Plan:  81 y.o. male is s/p Left CF, SFA and profunda endart with vein patch angioplasty, left pop/ TPT endart with vein patch angioplasty, left common femoral to below knee popliteal artery bypass with PTFE, harvest of left GSV 1 Day Post-Op   Pain overall well controlled Left leg is well perfused and warm with brisk PT doppler signal Incisions are intact and well appearing Tolerating ambulation Has had several episodes of chest pain. Sees Cardiologist and PCP who have not been able to identify cause over past 1 year. Did have some relief yesterday evening with nitro. Will monitor. If any worsening during admission will consider cardiology consult Continue Asa, Plavix Mobilize as tolerated  DVT prophylaxis:  sq heparin    Graceann Congress, PA-C [facility] [phone] 01/29/2022 9:21 AM  I have independently interviewed and examined patient and agree with PA assessment and plan above.   Ramiro Pangilinan C. Randie Heinz, MD [facility] [place] Office: [phone] Pager: [phone]

## 2022-01-29 NOTE — Progress Notes (Signed)
Mobility Specialist Progress Note:   01/29/22 0958  Mobility  Activity Ambulated independently in hallway  Level of Assistance Independent after set-up  Assistive Device None  Distance Ambulated (ft) 300 ft  Activity Response Tolerated well  $Mobility charge 1 Mobility   Pt walking independently with wife. No complaints. Left in chair with all needs met.    [employer] Surveyor, mining Chat only

## 2022-01-29 NOTE — Progress Notes (Signed)
OT Cancellation Note  Patient Details Name: [name] MRN: [id number] DOB: 05-25-1940   Cancelled Treatment:    Reason Eval/Treat Not Completed: OT screened, no needs identified, will sign off (Per discussion with PT, pt's spouse assisting at baseline with LB ADLs and bathing. Pt mobilizing at supervision level. Pt with no acute OT needs at this time, will s/o. Please reconsult if there is a change in pt status.)  Alfonzo Beers, OTD, OTR/L Acute Rehab [phone]   Mayer Masker 01/29/2022, 10:12 AM

## 2022-01-30 MED ORDER — OXYCODONE-ACETAMINOPHEN 5-325 MG PO TABS: 1.0000 | ORAL_TABLET | Freq: Four times a day (QID) | ORAL | 0 refills | Status: DC | PRN

## 2022-01-30 NOTE — Progress Notes (Addendum)
Progress Note    01/30/2022 8:01 AM 2 Days Post-Op  Subjective:  denies any pain. Says he ambulated 3 x yesterday. Overall doing well. Very minimal chest discomfort that went away quickly this morning around 4 am. Feels he is ready to go home   Vitals:   01/29/22 2358 01/30/22 0419  BP: (!) 97/59 (!) 152/80  Pulse: 86 100  Resp: 14 20  Temp: 98.1 F (36.7 C) 98 F (36.7 C)  SpO2: 97% 97%   Physical Exam: Cardiac:  regular Lungs:  non labored Incisions:  left leg incisions are clean, dry and intact without swelling or hematoma Extremities:  Left left is well perfused and warm. Doppler PT signal Abdomen:  soft, non distended Neurologic: alert and oriented  CBC    Component Value Date/Time   WBC 9.4 01/29/2022 0412   RBC 3.14 (L) 01/29/2022 0412   HGB 9.2 (L) 01/29/2022 0412   HGB 12.7 (L) 11/27/2020 1523   HCT 27.7 (L) 01/29/2022 0412   HCT 37.6 11/27/2020 1523   PLT 198 01/29/2022 0412   PLT 279 11/27/2020 1523   MCV 88.2 01/29/2022 0412   MCV 89 11/27/2020 1523   MCH 29.3 01/29/2022 0412   MCHC 33.2 01/29/2022 0412   RDW 13.8 01/29/2022 0412   RDW 13.3 11/27/2020 1523   LYMPHSABS 2.3 12/16/2021 1050   LYMPHSABS 2.2 04/08/2017 0824   MONOABS 0.8 12/16/2021 1050   EOSABS 0.2 12/16/2021 1050   EOSABS 0.3 04/08/2017 0824   BASOSABS 0.1 12/16/2021 1050   BASOSABS 0.0 04/08/2017 0824    BMET    Component Value Date/Time   NA 139 01/29/2022 0412   NA 143 11/27/2020 1523   K 3.7 01/29/2022 0412   CL 107 01/29/2022 0412   CO2 24 01/29/2022 0412   GLUCOSE 154 (H) 01/29/2022 0412   BUN 9 01/29/2022 0412   BUN 12 11/27/2020 1523   CREATININE 0.65 01/29/2022 0412   CREATININE 0.71 07/25/2016 0934   CALCIUM 7.7 (L) 01/29/2022 0412   GFRNONAA >60 01/29/2022 0412   GFRNONAA >89 07/25/2016 0934   GFRAA 104 06/26/2020 1009   GFRAA >89 07/25/2016 0934    INR    Component Value Date/Time   INR 1.2 01/28/2022 0353     Intake/Output Summary (Last 24 hours)  at 01/30/2022 0801 Last data filed at 01/30/2022 0420 Gross per 24 hour  Intake 120 ml  Output 1100 ml  Net -980 ml     Assessment/Plan:  81 y.o. male is s/p Left CF, SFA and profunda endart with vein patch angioplasty, left pop/ TPT endart with vein patch angioplasty, left common femoral to below knee popliteal artery bypass with PTFE, harvest of left GSV  2 Days Post-Op   Left leg is well perfused and warm with brisk PT signal Incisions are intact and well appearing Not having any pain Mobilizing well. Cleared by PT/ OT Continue Aspirin and Plavix  Has statin intolerance so no statin He is stable for discharge home today He will follow up in 2-3 weeks for incision check  DVT prophylaxis:  sq heparin    Graceann Congress, PA-C [facility] [phone] 01/30/2022 8:01 AM  I have independently interviewed and examined patient and agree with PA assessment and plan above.  Very strong left anterior tibial and dorsalis pedis signals although not palpable and also has a strong left peroneal signal.  Patient is okay for discharge and will follow-up for wound check in a few weeks.  Apolinar Junes  C. Donzetta Matters, MD [facility] [place] Office: [phone] Pager: [phone]

## 2022-01-30 NOTE — Care Management Important Message (Signed)
Important Message  Patient Details  Name: [name] MRN: [id number] Date of Birth: 03/25/1941   Medicare Important Message Given:  Yes     Renie Ora 01/30/2022, 10:11 AM

## 2022-01-30 NOTE — Progress Notes (Signed)
Mobility Specialist Progress Note:   01/30/22 1039  Mobility  Activity Ambulated with assistance in hallway  Level of Assistance Modified independent, requires aide device or extra time  Assistive Device None  Distance Ambulated (ft) 500 ft  Activity Response Tolerated well  $Mobility charge 1 Mobility   Pt received in chair willing to participate in mobility. Complaints of soreness at incision sites. Left in chair with call bell in reach and all needs met.   [facility] Surveyor, mining Chat only

## 2022-01-30 NOTE — TOC Transition Note (Signed)
Transition of Care (TOC) - CM/SW Discharge Note Donn Pierini RN, BSN Transitions of Care Unit 4E- RN Case Manager See Treatment Team for direct phone #    Patient Details  Name: Curran Lenderman MRN: [id number] Date of Birth: 04-01-1941  Transition of Care [facility]) CM/SW Contact:  Darrold Span, RN Phone Number: 01/30/2022, 10:19 AM   Clinical Narrative:    Pt stable for transition home today, CM Notified by Iantha Fallen -they are following patient with MD office protocol referral prearranged for [facility] needs. Iantha Fallen will contact pt post discharge to schedule [facility] visits.   No further TOC needs noted.    Final next level of care: Home w Home Health Services Barriers to Discharge: No Barriers Identified   Patient Goals and CMS Choice    Office protocol referral    Discharge Placement                 Home w/ [facility]      Discharge Plan and Services     Post Acute Care Choice: Home Health          DME Arranged: N/A DME Agency: NA         HH Agency: [facility]     Representative spoke with at [facility] Agency: Misty Stanley  Social Determinants of Health (SDOH) Interventions     Readmission Risk Interventions    01/30/2022   10:19 AM  Readmission Risk Prevention Plan  Post Dischage Appt Complete  Medication Screening Complete  Transportation Screening Complete

## 2022-01-30 NOTE — Discharge Summary (Signed)
Bypass Discharge Summary Patient ID: [name] [id number] 81 y.o. [birth year]  Admit date: 01/27/2022  Discharge date and time: No discharge date for patient encounter.   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  We have plan for endarterectomy of the common femoral and below the knee with bypass likely with PTFE given lack of vein on recent vein mapping.  Hospital Course: [name] was admitted on 01/28/22 and underwent left common femoral, superficial femoral and profundafemoral endarterectomy with vein patch angioplasty; left popliteal, tibioperoneal trunk endarterectomy with vein patch angioplasty; left common femoral to below knee popliteal artery bypass with 6 mm ringed PTFE and harvest of left greater saphenous vein in the mid thigh and below the knee. He tolerated the surgery well and was taken to the recovery room in stable condition.   By POD#1, he did well overnight. Ambulating in hall with PT without difficulty. Pain well controlled. Some incisional soreness in left groin. Brisk doppler PT signal in left leg. Incisions all well appearing. Hemodynamically stable. Had some episodes of chest pain, however per patient this was ongoing thing for past 1 year. Sees outpatient Cardiology and PCP for this. Offered inpatient Cardiology consult if worsening pain while in hospital. Continued  on Aspirin and Plavix. Tolerated ambulation with mobility specialist and cleared by PT/OT.   By POD#2, he remained stable for discharge home. Tolerating ambulation. Tolerating diet. Pain well controlled. Incisions remained intact and well appearing. Brisk doppler signals in left leg. No recurrence of chest pain. Will resume all home medications as prescribed. Will go home on Aspirin and Plavix. Does not tolerate statins due to myalgias. PDMP reviewed and prescription sent to patients pharmacy. He will have follow up arranged in 2-3 weeks for incision check.    Consults: None  Treatments: analgesia: Dilaudid and Aspirin, therapies: PT and OT, and surgery: 1.  Left common femoral, superficial femoral and profundofemoral endarterectomy with vein patch angioplasty 2.  Left popliteal, tibioperoneal trunk endarterectomy with vein patch angioplasty 3.  Left common femoral to below-knee popliteal artery bypass with 6 mm ringed PTFE 4.  Harvest of left greater saphenous vein in the mid thigh and below the knee   Disposition: Discharge disposition: 01-Home or Self Care       - For [place] [facility] use ---  Post-op:  Wound infection: No  Graft infection: No  Transfusion: No  If yes, 0 units given New Arrhythmia: No Patency judged by: [ X] Dopper only, [ ]  Palpable graft pulse, [ ]  Palpable distal pulse, [ ]  ABI inc. > 0.15, [ ]  Duplex D/C Ambulatory Status: Ambulatory  Complications: MI: ] No, [ ]  Troponin only, [ ]  EKG or Clinical CHF: No Resp failure: [ X] none, [ ]  Pneumonia, [ ]  Ventilator Chg in renal function: ] none, [ ]  Inc. Cr > 0.5, [ ]  Temp. Dialysis, [ ]  Permanent dialysis Stroke: [ X] None, [ ]  Minor, [ ]  Major Return to OR: No  Reason for return to  OR: [ ]  Bleeding, [ ]  Infection, [ ]  Thrombosis, [ ]  Revision  Discharge medications: Statin use:  No  for medical reason does not tolerate due to myalgias ASA use:  Yes Plavix use:  Yes Beta blocker use: Yes Coumadin use:  No  for medical reason not indicated    Patient Instructions:  Allergies as of 01/30/2022       Reactions   Crestor [rosuvastatin] Other (See Comments)   Myalgias   Lipitor [atorvastatin]    myalgias   Lisinopril    cough        Medication List     STOP taking these medications    cilostazol 50 MG tablet Commonly known as: PLETAL       TAKE these medications    amLODipine 10 MG tablet Commonly known as: NORVASC TAKE 1 TABLET BY MOUTH EVERY DAY   aspirin 81 MG tablet Take 81 mg by mouth daily.   cephALEXin 500 MG capsule Commonly known as: KEFLEX Take 1 capsule (500 mg total) by mouth 2 (two) times daily.   clopidogrel 75 MG tablet Commonly known as: PLAVIX TAKE 1 TABLET BY MOUTH EVERY DAY   CoQ10 200 MG Caps Take 200 mg by mouth daily.   famotidine 20 MG tablet Commonly known as: PEPCID Take 1 tablet (20 mg total) by mouth daily.   Fish Oil 1200 MG Caps Take 2 capsules (2,400 mg total) by mouth every morning.   fluticasone 50 MCG/ACT nasal spray Commonly known as: FLONASE Place 2 sprays into both nostrils daily.   isosorbide mononitrate 60 MG 24 hr tablet Commonly known as: IMDUR TAKE 1 TABLET BY MOUTH EVERY DAY   levocetirizine 5 MG tablet Commonly known as: XYZAL Take 1 tablet (5 mg total) by mouth every evening.   levofloxacin 750 MG tablet Commonly known as: LEVAQUIN Take 750 mg by mouth daily.   metFORMIN 500 MG tablet Commonly known as: GLUCOPHAGE Take 1 tablet (500 mg total) by mouth 2 (two) times daily with a meal.   metoprolol tartrate 50 MG tablet Commonly known as: LOPRESSOR Take 1 tablet (50 mg total) by mouth daily.   nitroGLYCERIN 0.4 MG SL tablet Commonly known as: NITROSTAT Place 1 tablet (0.4 mg total) under the tongue every 5 (five) minutes as needed for chest pain.   oxyCODONE-acetaminophen 5-325 MG tablet Commonly known as: PERCOCET/ROXICET Take 1 tablet by mouth every 6 (six) hours as needed for severe pain.    Praluent 150 MG/ML Soaj Generic drug: Alirocumab INJECT 150 MG INTO THE SKIN EVERY 14 (FOURTEEN) DAYS.   silver sulfADIAZINE 1 % cream Commonly known as: Silvadene Apply 1 Application topically 2 (two) times daily.   Vitamin D 50 MCG (2000 UT) tablet Take 2,000 Units by mouth daily.               Discharge Care Instructions  (From admission, onward)           Start     Ordered   01/30/22 0000  Discharge wound care:       Comments: Wash incisions with mild soap and water, pat dry. Do not soak in bathtub   01/30/22 1015           Activity: activity as tolerated, no driving while on analgesics, and no heavy lifting for 6 weeks Diet: regular diet and low fat, low cholesterol diet Wound Care: keep wound clean and dry and wash with mild soap and water, pat dry. Do not soak in bathtub  Follow-up with Dr. Randie Heinz in  2-3  weeks.  Signed: Graceann Congress 01/30/2022 10:15 AM

## 2022-01-30 NOTE — Progress Notes (Signed)
Nsg Discharge Note  Admit Date:  01/27/2022 Discharge date: 01/30/2022   [name] to be D/C'd Home per MD order.  AVS completed. Patient/caregiver able to verbalize understanding.  Discharge Medication: Allergies as of 01/30/2022       Reactions   Crestor [rosuvastatin] Other (See Comments)   Myalgias   Lipitor [atorvastatin]    myalgias   Lisinopril    cough        Medication List     STOP taking these medications    cilostazol 50 MG tablet Commonly known as: PLETAL       TAKE these medications    amLODipine 10 MG tablet Commonly known as: NORVASC TAKE 1 TABLET BY MOUTH EVERY DAY   aspirin 81 MG tablet Take 81 mg by mouth daily.   cephALEXin 500 MG capsule Commonly known as: KEFLEX Take 1 capsule (500 mg total) by mouth 2 (two) times daily.   clopidogrel 75 MG tablet Commonly known as: PLAVIX TAKE 1 TABLET BY MOUTH EVERY DAY   CoQ10 200 MG Caps Take 200 mg by mouth daily.   famotidine 20 MG tablet Commonly known as: PEPCID Take 1 tablet (20 mg total) by mouth daily.   Fish Oil 1200 MG Caps Take 2 capsules (2,400 mg total) by mouth every morning.   fluticasone 50 MCG/ACT nasal spray Commonly known as: FLONASE Place 2 sprays into both nostrils daily.   isosorbide mononitrate 60 MG 24 hr tablet Commonly known as: IMDUR TAKE 1 TABLET BY MOUTH EVERY DAY   levocetirizine 5 MG tablet Commonly known as: XYZAL Take 1 tablet (5 mg total) by mouth every evening.   levofloxacin 750 MG tablet Commonly known as: LEVAQUIN Take 750 mg by mouth daily.   metFORMIN 500 MG tablet Commonly known as: GLUCOPHAGE Take 1 tablet (500 mg total) by mouth 2 (two) times daily with a meal.   metoprolol tartrate 50 MG tablet Commonly known as: LOPRESSOR Take 1 tablet (50 mg total) by mouth daily.   nitroGLYCERIN 0.4 MG SL tablet Commonly known as: NITROSTAT Place 1 tablet (0.4 mg total) under the tongue every 5 (five) minutes as needed for chest pain.    oxyCODONE-acetaminophen 5-325 MG tablet Commonly known as: PERCOCET/ROXICET Take 1 tablet by mouth every 6 (six) hours as needed for severe pain.   Praluent 150 MG/ML Soaj Generic drug: Alirocumab INJECT 150 MG INTO THE SKIN EVERY 14 (FOURTEEN) DAYS.   silver sulfADIAZINE 1 % cream Commonly known as: Silvadene Apply 1 Application topically 2 (two) times daily.   Vitamin D 50 MCG (2000 UT) tablet Take 2,000 Units by mouth daily.               Discharge Care Instructions  (From admission, onward)           Start     Ordered   01/30/22 0000  Discharge wound care:       Comments: Wash incisions with mild soap and water, pat dry. Do not soak in bathtub   01/30/22 1015            Discharge Assessment: Vitals:   01/30/22 0419 01/30/22 0900  BP: (!) 152/80 (!) 111/58  Pulse: 100 96  Resp: 20   Temp: 98 F (36.7 C) 98 F (36.7 C)  SpO2: 97% 97%   Skin clean, dry and intact without evidence of skin break down, no evidence of skin tears noted. IV catheter discontinued intact. Site without signs and symptoms of complications - no redness  or edema noted at insertion site, patient denies c/o pain - only slight tenderness at site.  Dressing with slight pressure applied.  D/c Instructions-Education: Discharge instructions given to patient/family with verbalized understanding. D/c education completed with patient/family including follow up instructions, medication list, d/c activities limitations if indicated, with other d/c instructions as indicated by MD - patient able to verbalize understanding, all questions fully answered. Patient instructed to return to ED, call 911, or call MD for any changes in condition.  Patient escorted via WC, and D/C home via private auto.  Kizzie Bane, RN 01/30/2022 11:35 AM

## 2022-01-30 NOTE — Discharge Instructions (Signed)
 [facility]  Discharge instructions  Lower Extremity Bypass Surgery  Please refer to the following instruction for your post-procedure care. Your surgeon or physician assistant will discuss any changes with you.  Activity  You are encouraged to walk as much as you can. You can slowly return to normal activities during the month after your surgery. Avoid strenuous activity and heavy lifting until your doctor tells you it's OK. Avoid activities such as vacuuming or swinging a golf club. Do not drive until your doctor give the OK and you are no longer taking prescription pain medications. It is also normal to have difficulty with sleep habits, eating and bowel movement after surgery. These will go away with time.  Bathing/Showering  Shower daily after you go home. Do not soak in a bathtub, hot tub, or swim until the incision heals completely.  Incision Care  Clean your incision with mild soap and water. Shower every day. Pat the area dry with a clean towel. You do not need a bandage unless otherwise instructed. Do not apply any ointments or creams to your incision. If you have open wounds you will be instructed how to care for them or a visiting nurse may be arranged for you. If you have staples or sutures along your incision they will be removed at your post-op appointment. You may have skin glue on your incision. Do not peel it off. It will come off on its own in about one week.  Wash the groin wound with soap and water daily and pat dry. (No tub bath-only shower)  Then put a dry gauze or washcloth in the groin to keep this area dry to help prevent wound infection.  Do this daily and as needed.  Do not use Vaseline or neosporin on your incisions.  Only use soap and water on your incisions and then protect and keep dry.  Diet  Resume your normal diet. There are no special food restrictions following this procedure. A low fat/ low cholesterol diet is  recommended for all patients with vascular disease. In order to heal from your surgery, it is CRITICAL to get adequate nutrition. Your body requires vitamins, minerals, and protein. Vegetables are the best source of vitamins and minerals. Vegetables also provide the perfect balance of protein. Processed food has little nutritional value, so try to avoid this.  Medications  Resume taking all your medications unless your doctor or physician assistant tells you not to. If your incision is causing pain, you may take over-the-counter pain relievers such as acetaminophen (Tylenol). If you were prescribed a stronger pain medication, please aware these medication can cause nausea and constipation. Prevent nausea by taking the medication with a snack or meal. Avoid constipation by drinking plenty of fluids and eating foods with high amount of fiber, such as fruits, vegetables, and grains. Take Colace 100 mg (an over-the-counter stool softener) twice a day as needed for constipation.  Do not take Tylenol if you are taking prescription pain medications.  Follow Up  Our office will schedule a follow up appointment 2-3 weeks following discharge.  Please call us immediately for any of the following conditions  Severe or worsening pain in your legs or feet while at rest or while walking Increase pain, redness, warmth, or drainage (pus) from your incision site(s) Fever of 101 degree or higher The swelling in your leg with the bypass suddenly worsens and becomes more painful than when you were in the hospital If you have   been instructed to feel your graft pulse then you should do so every day. If you can no longer feel this pulse, call the office immediately. Not all patients are given this instruction.  Leg swelling is common after leg bypass surgery.  The swelling should improve over a few months following surgery. To improve the swelling, you may elevate your legs above the level of your heart while you are  sitting or resting. Your surgeon or physician assistant may ask you to apply an ACE wrap or wear compression (TED) stockings to help to reduce swelling.  Reduce your risk of vascular disease  Stop smoking. If you would like help call [facility] at 1-800-QUIT-NOW (1-800-784-8669) or [facility] at 336-586-4000.  Manage your cholesterol Maintain a desired weight Control your diabetes weight Control your diabetes Keep your blood pressure down  If you have any questions, please call the office at [phone]  

## 2022-02-04 ENCOUNTER — Telehealth: Payer: Self-pay | Admitting: *Deleted

## 2022-02-04 ENCOUNTER — Other Ambulatory Visit: Payer: Self-pay

## 2022-02-04 ENCOUNTER — Ambulatory Visit (INDEPENDENT_AMBULATORY_CARE_PROVIDER_SITE_OTHER): Payer: Medicare HMO | Admitting: Medical

## 2022-02-04 ENCOUNTER — Emergency Department (HOSPITAL_BASED_OUTPATIENT_CLINIC_OR_DEPARTMENT_OTHER): Payer: Medicare HMO

## 2022-02-04 ENCOUNTER — Observation Stay (HOSPITAL_BASED_OUTPATIENT_CLINIC_OR_DEPARTMENT_OTHER)
Admission: EM | Admit: 2022-02-04 | Discharge: 2022-02-06 | Disposition: A | Discharge status: Home / Self Care | Payer: Medicare HMO | Attending: Internal Medicine | Admitting: Internal Medicine

## 2022-02-04 ENCOUNTER — Encounter (HOSPITAL_BASED_OUTPATIENT_CLINIC_OR_DEPARTMENT_OTHER): Payer: Self-pay | Admitting: Emergency Medicine

## 2022-02-04 ENCOUNTER — Encounter: Payer: Self-pay | Admitting: Medical

## 2022-02-04 VITALS — BP 116/78 | HR 72 | Resp 18 | Ht 73.0 in | Wt 218.0 lb

## 2022-02-04 DIAGNOSIS — E876 Hypokalemia: Secondary | ICD-10-CM | POA: Insufficient documentation

## 2022-02-04 DIAGNOSIS — E785 Hyperlipidemia, unspecified: Secondary | ICD-10-CM | POA: Diagnosis present

## 2022-02-04 DIAGNOSIS — Z951 Presence of aortocoronary bypass graft: Secondary | ICD-10-CM | POA: Diagnosis not present

## 2022-02-04 DIAGNOSIS — Z7982 Long term (current) use of aspirin: Secondary | ICD-10-CM | POA: Insufficient documentation

## 2022-02-04 DIAGNOSIS — I25110 Atherosclerotic heart disease of native coronary artery with unstable angina pectoris: Secondary | ICD-10-CM | POA: Diagnosis not present

## 2022-02-04 DIAGNOSIS — D72829 Elevated white blood cell count, unspecified: Secondary | ICD-10-CM | POA: Insufficient documentation

## 2022-02-04 DIAGNOSIS — R0789 Other chest pain: Principal | ICD-10-CM | POA: Insufficient documentation

## 2022-02-04 DIAGNOSIS — R079 Chest pain, unspecified: Secondary | ICD-10-CM | POA: Diagnosis not present

## 2022-02-04 DIAGNOSIS — R778 Other specified abnormalities of plasma proteins: Secondary | ICD-10-CM | POA: Diagnosis present

## 2022-02-04 DIAGNOSIS — E119 Type 2 diabetes mellitus without complications: Secondary | ICD-10-CM

## 2022-02-04 DIAGNOSIS — I10 Essential (primary) hypertension: Secondary | ICD-10-CM

## 2022-02-04 DIAGNOSIS — Z79899 Other long term (current) drug therapy: Secondary | ICD-10-CM | POA: Diagnosis not present

## 2022-02-04 DIAGNOSIS — Z23 Encounter for immunization: Secondary | ICD-10-CM | POA: Diagnosis not present

## 2022-02-04 DIAGNOSIS — D649 Anemia, unspecified: Secondary | ICD-10-CM | POA: Insufficient documentation

## 2022-02-04 DIAGNOSIS — I739 Peripheral vascular disease, unspecified: Secondary | ICD-10-CM | POA: Diagnosis present

## 2022-02-04 DIAGNOSIS — Z87891 Personal history of nicotine dependence: Secondary | ICD-10-CM | POA: Diagnosis not present

## 2022-02-04 DIAGNOSIS — Z7984 Long term (current) use of oral hypoglycemic drugs: Secondary | ICD-10-CM | POA: Diagnosis not present

## 2022-02-04 DIAGNOSIS — T148XXD Other injury of unspecified body region, subsequent encounter: Secondary | ICD-10-CM

## 2022-02-04 DIAGNOSIS — R2689 Other abnormalities of gait and mobility: Secondary | ICD-10-CM | POA: Insufficient documentation

## 2022-02-04 DIAGNOSIS — I25700 Atherosclerosis of coronary artery bypass graft(s), unspecified, with unstable angina pectoris: Secondary | ICD-10-CM

## 2022-02-04 DIAGNOSIS — L089 Local infection of the skin and subcutaneous tissue, unspecified: Secondary | ICD-10-CM

## 2022-02-04 LAB — HEPATIC FUNCTION PANEL
ALT: 16 U/L (ref 0–44)
AST: 26 U/L (ref 15–41)
Albumin: 3.3 g/dL — ABNORMAL LOW (ref 3.5–5.0)
Alkaline Phosphatase: 60 U/L (ref 38–126)
Bilirubin, Direct: 0.3 mg/dL — ABNORMAL HIGH (ref 0.0–0.2)
Indirect Bilirubin: 1.1 mg/dL — ABNORMAL HIGH (ref 0.3–0.9)
Total Bilirubin: 1.4 mg/dL — ABNORMAL HIGH (ref 0.3–1.2)
Total Protein: 6 g/dL — ABNORMAL LOW (ref 6.5–8.1)

## 2022-02-04 LAB — COMPREHENSIVE METABOLIC PANEL
ALT: 15 U/L (ref 0–53)
AST: 20 U/L (ref 0–37)
Albumin: 3.6 g/dL (ref 3.5–5.2)
Alkaline Phosphatase: 60 U/L (ref 39–117)
BUN: 14 mg/dL (ref 6–23)
CO2: 26 mEq/L (ref 19–32)
Calcium: 9.3 mg/dL (ref 8.4–10.5)
Chloride: 106 mEq/L (ref 96–112)
Creatinine, Ser: 0.72 mg/dL (ref 0.40–1.50)
GFR: 86.7 mL/min (ref >60.00)
Glucose, Bld: 140 mg/dL — ABNORMAL HIGH (ref 70–99)
Potassium: 3.6 mEq/L (ref 3.5–5.1)
Sodium: 141 mEq/L (ref 135–145)
Total Bilirubin: 1.5 mg/dL — ABNORMAL HIGH (ref 0.2–1.2)
Total Protein: 6.4 g/dL (ref 6.0–8.3)

## 2022-02-04 LAB — CBC
HCT: 28.8 % — ABNORMAL LOW (ref 39.0–52.0)
Hemoglobin: 9.4 g/dL — ABNORMAL LOW (ref 13.0–17.0)
MCH: 29 pg (ref 26.0–34.0)
MCHC: 32.6 g/dL (ref 30.0–36.0)
MCV: 88.9 fL (ref 80.0–100.0)
Platelets: 277 10*3/uL (ref 150–400)
RBC: 3.24 MIL/uL — ABNORMAL LOW (ref 4.22–5.81)
RDW: 15.1 % (ref 11.5–15.5)
WBC: 10.6 10*3/uL — ABNORMAL HIGH (ref 4.0–10.5)
nRBC: 0 % (ref 0.0–0.2)

## 2022-02-04 LAB — BASIC METABOLIC PANEL
Anion gap: 7 (ref 5–15)
BUN: 14 mg/dL (ref 8–23)
CO2: 24 mmol/L (ref 22–32)
Calcium: 8.7 mg/dL — ABNORMAL LOW (ref 8.9–10.3)
Chloride: 108 mmol/L (ref 98–111)
Creatinine, Ser: 0.76 mg/dL (ref 0.61–1.24)
GFR, Estimated: >60 mL/min (ref >60)
Glucose, Bld: 123 mg/dL — ABNORMAL HIGH (ref 70–99)
Potassium: 3.5 mmol/L (ref 3.5–5.1)
Sodium: 139 mmol/L (ref 135–145)

## 2022-02-04 LAB — TROPONIN I (HIGH SENSITIVITY)
High Sens Troponin I: 63 ng/L (ref 2–17)
Troponin I (High Sensitivity): 56 ng/L — ABNORMAL HIGH (ref <18)
Troponin I (High Sensitivity): 55 ng/L — ABNORMAL HIGH (ref <18)

## 2022-02-04 LAB — GLUCOSE, CAPILLARY: Glucose-Capillary: 151 mg/dL — ABNORMAL HIGH (ref 70–99)

## 2022-02-04 LAB — LIPASE, BLOOD: Lipase: 40 U/L (ref 11–51)

## 2022-02-04 MED ORDER — IOHEXOL 350 MG/ML SOLN
75.0000 mL | Freq: Once | INTRAVENOUS | Status: AC | PRN
  Administered 2022-02-04: 75 mL via INTRAVENOUS

## 2022-02-04 NOTE — Progress Notes (Addendum)
Subjective:    Patient ID: [name], male    DOB: [birth year], 81 y.o.   MRN: [id number]  HPI Pt in for some left side slight serosanguinous drainage to left upper hip area at entry site of recent surgery.   Pt has home health care nurse that is coming out.   Pt also states recent increase of pain in his chest. Last time he had pain was 4:55  am. It lasted 10 minutes. Night before last he had pain that was significant enough that he used one nitro. Pain lasted one minute and then pain subsided in 10 minutes. Often time these events will be after he used bathroom and getting back in bed. Also last night he was in hospital had chest pain. He used nitro and pain subsided.    "I  performed right left heart cath on him 06/28/2020 revealing a left dominant system, normal LV function, patent vein to the distal left PDA, LM branch and LIMA to the LAD.  He did have a 90% apical LAD stenosis after LIMA insertion.  His hemodynamics are stable.  He says he feels better since the heart cath although I did not do an intervention.  I also performed abdominal aortography and bilateral iliac angiography revealing no obstructive disease.  He does complain of bilateral calf claudication.  The etiology of his chest pain cannot be explained by the findings on his heart cath I have reassured him and his wife [name].   I performed peripheral angiography on him 12/06/2020 revealing occluded SFAs bilaterally with three-vessel runoff.  He had a 90% calcified exophytic distal left common iliac artery stenosis.  I performed orbital atherectomy, PTA and covered stenting with an 8 mm x 29 mm long VBX covered stent postdilated with a 10 mm x 2 cm balloon.  His follow-up Doppler studies revealed unchanged ABIs although his velocities improved.  His claudication improved as well although given his SFAs bilaterally.  He still has some lifestyle limiting claudication."   "Hx of CABG History of CAD status post CABG by Dr. Lilly Cove  12/27/2004 for left main/three-vessel disease.  I performed cardiac catheterization on him 06/28/2020 revealing patent grafts with an apical LAD lesion.  I do not think he had a "culprit lesion that would be causing his chest pain symptoms.  He is on good antianginal medications.   HTN (hypertension) History of essential hypertension blood pressure measured today at 100/60.  He is on amlodipine, metoprolol.   Dyslipidemia History of hyperlipidemia on Praluent with lipid profile performed 10/21/2021 revealing a total cholesterol of 68, LDL of 60 and HDL 40."    Review of Systems  Constitutional:  Negative for chills, fatigue and fever.  Respiratory:  Negative for cough, chest tightness, shortness of breath and wheezing.   Cardiovascular:  Negative for chest pain and palpitations.       See hpi. Last event 4:55 am. No past 6 hours.   Gastrointestinal:  Negative for abdominal pain and constipation.  Musculoskeletal:  Negative for back pain, myalgias and neck stiffness.  Skin:  Negative for rash.  Neurological:  Negative for dizziness, syncope, light-headedness and numbness.  Hematological:  Negative for adenopathy. Does not bruise/bleed easily.  Psychiatric/Behavioral:  Negative for behavioral problems and confusion.     Past Medical History:  Diagnosis Date   Claudication ([place]) 06/27/2011   Left ABIs abnormal values with mild-moderate arterial insufficiency, right SFA moderate amount of mixed density plaque elevating velocities suggestive 50-69% diameter reduction  Coronary artery disease 06/27/2011   Normal Lexiscan, EF 64, post-stress EF 64, no EKG changes   Diabetes mellitus without complication (HCC)    prediabetic   Hyperlipidemia    Hypertension    Impotence of organic origin    Osteoarthritis    Other testicular hypofunction    PVD (peripheral vascular disease) (HCC)      Social History   Socioeconomic History   Marital status: Married    Spouse name: [name]   Number of  children: 1   Years of education: 12   Highest education level: Not on file  Occupational History   Occupation: retired  Tobacco Use   Smoking status: Former    Packs/day: 1.00    Years: 50.00    Total pack years: 50.00    Types: Cigarettes    Quit date: 12/17/2004    Years since quitting: 17.1   Smokeless tobacco: Never  Vaping Use   Vaping Use: Never used  Substance and Sexual Activity   Alcohol use: Yes    Comment: occasionally   Drug use: No   Sexual activity: Not on file  Other Topics Concern   Not on file  Social History Narrative   Lives with wife   Caffeine - coffee, 1 cup daily   Social Determinants of Health   Financial Resource Strain: Not on file  Food Insecurity: No Food Insecurity (01/28/2022)   Hunger Vital Sign    Worried About Running Out of Food in the Last Year: Never true    Ran Out of Food in the Last Year: Never true  Transportation Needs: No Transportation Needs (01/28/2022)   PRAPARE - Administrator, Civil Service (Medical): No    Lack of Transportation (Non-Medical): No  Physical Activity: Not on file  Stress: Not on file  Social Connections: Not on file  Intimate Partner Violence: Not At Risk (01/28/2022)   Humiliation, Afraid, Rape, and Kick questionnaire    Fear of Current or Ex-Partner: No    Emotionally Abused: No    Physically Abused: No    Sexually Abused: No    Past Surgical History:  Procedure Laterality Date   ABDOMINAL AORTOGRAM N/A 06/28/2020   Procedure: ABDOMINAL AORTOGRAM;  Surgeon: [name], MD;  Location: [facility];  Service: Cardiovascular;  Laterality: N/A;   ABDOMINAL AORTOGRAM W/LOWER EXTREMITY Left 01/27/2022   Procedure: ABDOMINAL AORTOGRAM W/LOWER EXTREMITY;  Surgeon: Maeola Harman, MD;  Location: [facility] INVASIVE [facility];  Service: Cardiovascular;  Laterality: Left;   CARDIAC CATHETERIZATION  12/26/2004   Left main-90% distal tapered stenosis, left circumflex-95% ostial stenosis,  first marginal branch 99% ostial branch, posterior descending artery 99% ostial stenosis, 60-70% segmental mid stenosis, needed CABG   CORONARY ARTERY BYPASS GRAFT  12/27/2004   Left main 3-vessel disease, LIMA to LAD, saphenous vein to circumflexmarginal branch and to the PDA   ENDARTERECTOMY FEMORAL Left 01/28/2022   Procedure: LEFT ENDARTERECTOMYCOMMON  [facility] BELOW KNEE POPLITEAL;  Surgeon: Maeola Harman, MD;  Location: [facility];  Service: Vascular;  Laterality: Left;   FEMORAL-POPLITEAL BYPASS GRAFT Left 01/28/2022   Procedure: LEFT FEMORAL-POPLITEAL ARTERY BYPASS;  Surgeon: Maeola Harman, MD;  Location: [facility] OR;  Service: Vascular;  Laterality: Left;   INGUINAL HERNIA REPAIR Left 2005   LOWER EXTREMITY ANGIOGRAPHY N/A 04/13/2017   Procedure: LOWER EXTREMITY ANGIOGRAPHY;  Surgeon: [name], MD;  Location: [facility];  Service: Cardiovascular;  Laterality: N/A;   LOWER EXTREMITY ANGIOGRAPHY  Bilateral 12/06/2020   Procedure: Lower Extremity Angiography;  Surgeon: Runell GessBerry, Jonathan J, MD;  [facility];  Service: Cardiovascular;  Laterality: Bilateral;  Limited Study   LOWER EXTREMITY INTERVENTION N/A 08/04/2016   Procedure: Lower Extremity Intervention;  Surgeon: Runell GessJonathan J Berry, MD;  [facility];  Service: Cardiovascular;  Laterality: N/A;   PERIPHERAL VASCULAR ATHERECTOMY Left 04/13/2017   Procedure: PERIPHERAL VASCULAR ATHERECTOMY;  Surgeon: Runell GessBerry, Jonathan J, MD;  [facility];  Service: Cardiovascular;  Laterality: Left;  SFA   PERIPHERAL VASCULAR ATHERECTOMY  12/06/2020   Procedure: PERIPHERAL VASCULAR ATHERECTOMY;  Surgeon: Runell GessBerry, Jonathan J, MD;  [facility];  Service: Cardiovascular;;  Lt Iliac   PERIPHERAL VASCULAR BALLOON ANGIOPLASTY Left 04/13/2017   Procedure: PERIPHERAL VASCULAR BALLOON ANGIOPLASTY;  Surgeon: Runell GessBerry, Jonathan J, MD;  Location: [facility];  Service:  Cardiovascular;  Laterality: Left;  SFA   PERIPHERAL VASCULAR INTERVENTION  12/06/2020   Procedure: PERIPHERAL VASCULAR INTERVENTION;  Surgeon: Runell GessBerry, Jonathan J, MD;  Location: [facility];  Service: Cardiovascular;;  Lt Iliac   RIGHT/LEFT HEART CATH AND CORONARY/GRAFT ANGIOGRAPHY N/A 06/28/2020   Procedure: RIGHT/LEFT HEART CATH AND CORONARY/GRAFT ANGIOGRAPHY;  Surgeon: Runell GessBerry, Jonathan J, MD;  Location: [facility];  Service: Cardiovascular;  Laterality: N/A;    Family History  Problem Relation Age of Onset   Hypertension Mother        [id number]   Clotting disorder Father        [id number]   Diabetes Brother        heart surgery    Allergies  Allergen Reactions   Crestor [Rosuvastatin] Other (See Comments)    Myalgias   Lipitor [Atorvastatin]     myalgias   Lisinopril     cough    Current Outpatient Medications on File Prior to Visit  Medication Sig Dispense Refill   amLODipine (NORVASC) 10 MG tablet TAKE 1 TABLET BY MOUTH EVERY DAY 90 tablet 3   aspirin 81 MG tablet Take 81 mg by mouth daily.     cephALEXin (KEFLEX) 500 MG capsule Take 1 capsule (500 mg total) by mouth 2 (two) times daily. 6 capsule 0   Cholecalciferol (VITAMIN D) 50 MCG (2000 UT) tablet Take 2,000 Units by mouth daily.     clopidogrel (PLAVIX) 75 MG tablet TAKE 1 TABLET BY MOUTH EVERY DAY 90 tablet 3   Coenzyme Q10 (COQ10) 200 MG CAPS Take 200 mg by mouth daily.     famotidine (PEPCID) 20 MG tablet Take 1 tablet (20 mg total) by mouth daily. 90 tablet 1   fluticasone (FLONASE) 50 MCG/ACT nasal spray Place 2 sprays into both nostrils daily. 16 g 1   isosorbide mononitrate (IMDUR) 60 MG 24 hr tablet TAKE 1 TABLET BY MOUTH EVERY DAY 90 tablet 3   levocetirizine (XYZAL) 5 MG tablet Take 1 tablet (5 mg total) by mouth every evening. 90 tablet 1   levofloxacin (LEVAQUIN) 750 MG tablet Take 750 mg by mouth daily.     metFORMIN (GLUCOPHAGE) 500 MG tablet Take 1 tablet (500 mg total) by mouth 2 (two) times daily  with a meal. 180 tablet 3   metoprolol tartrate (LOPRESSOR) 50 MG tablet Take 1 tablet (50 mg total) by mouth daily. 90 tablet 0   nitroGLYCERIN (NITROSTAT) 0.4 MG SL tablet Place 1 tablet (0.4 mg total) under the tongue every 5 (five) minutes as needed for chest pain. 25 tablet 3  Omega-3 Fatty Acids (FISH OIL) 1200 MG CAPS Take 2 capsules (2,400 mg total) by mouth every morning.     oxyCODONE-acetaminophen (PERCOCET/ROXICET) 5-325 MG tablet Take 1 tablet by mouth every 6 (six) hours as needed for severe pain. 15 tablet 0   PRALUENT 150 MG/ML SOAJ INJECT 150 MG INTO THE SKIN EVERY 14 (FOURTEEN) DAYS. 2 mL 11   silver sulfADIAZINE (SILVADENE) 1 % cream Apply 1 Application topically 2 (two) times daily. 50 g 0   No current facility-administered medications on file prior to visit.    BP 100/60   Lloyd 73   Resp 18   Ht 6\' 1"  (1.854 m)   Wt 218 lb (98.9 kg)   SpO2 99%   BMI 28.76 kg/m        Objective:   Physical Exam  General- No acute distress. Pleasant patient. Neck- Full range of motion, no jvd Lungs- Clear, even and unlabored. Heart- regular rate and rhythm. Neurologic- CNII- XII grossly intact.   Anterior chest- some left side chest pain on palpation.  Left groin area- wound looks clean. Slight moist serosanguinous type drainage. No yellwow. Left foot- toe wounds look better than before.    Assessment & Plan:  [phone]    Patient Instructions  No chest pain presently but having more recurrent angina type pain recently. Last time had pain this am more than 6 hours. No nitro used. Some costochondral junction tenderness on palpation.  Will get one set of troponin stat now. If elevation will advise ED evaluation for further repeat studies.  Follow cardiologist advise on using nitro. If pain not subsiding be seen in ED.   Will go ahead and refer back to cardiologist in light of increased frequency of chest pain.  Htn- bp well controlled.   Dyslipidemia History  of hyperlipidemia on Praluent   Left foot by inspection does appear to be healing. See wound care tomorrow.  Left groin wound from surgery looks cleared minimal moist discharge present. Will get wound culture.  For diabetes eat low sugar diet, check sugars twice daily(one fasting in am and other post meal). Continue metformin.  Follow up in 2 weeks or sooner if needed.             Time spent with patient today was  43 minutes which consisted of chart revdiew, discussing diagnosis, work up treatment and documentation.

## 2022-02-04 NOTE — ED Triage Notes (Signed)
Pt states he went to his PCP this morning and was told he has an elevated troponin level. Pt was originally at PCP to be evaluated for intermittent left sided chest pain that has been going on x 1 year. Denies chest pain at this time.   Pt was also being evaluated by pcp for drainage from his left groin incision and his new DM diagnosis.

## 2022-02-04 NOTE — ED Notes (Signed)
Notified lab regarding new orders.

## 2022-02-04 NOTE — Telephone Encounter (Signed)
CRITICAL VALUE STICKER  CRITICAL VALUE: Troponin -- 63  RECEIVER (on-site recipient of call): Kelle Darting, [place] NOTIFIED: 02/04/22 @ 1:05  MESSENGER (representative from lab): [place]  MD NOTIFIED: Mackie Pai, PA  TIME OF NOTIFICATION:  RESPONSE:

## 2022-02-04 NOTE — ED Provider Notes (Signed)
[facility] EMERGENCY DEPARTMENT Provider Note   CSN: [id number] Arrival date & time: 02/04/22  1429     History  Chief Complaint  Patient presents with   Abnormal Lab    [name] is a 81 y.o. male.  The history is provided by the patient and medical records. No language interpreter was used.  Abnormal Lab Time since result:  11am Patient referred by:  PCP Resulting agency:  Internal Result type: cardiac   Cardiac:    Troponin:  High      Home Medications Prior to Admission medications   Medication Sig Start Date End Date Taking? Authorizing Provider  amLODipine (NORVASC) 10 MG tablet TAKE 1 TABLET BY MOUTH EVERY DAY 08/26/21   Runell GessBerry, Jonathan J, MD  aspirin 81 MG tablet Take 81 mg by mouth daily.    [provider]  cephALEXin (KEFLEX) 500 MG capsule Take 1 capsule (500 mg total) by mouth 2 (two) times daily. 12/24/21   Saguier, Ramon DredgeEdward, PA-C  Cholecalciferol (VITAMIN D) 50 MCG (2000 UT) tablet Take 2,000 Units by mouth daily.    [provider]  clopidogrel (PLAVIX) 75 MG tablet TAKE 1 TABLET BY MOUTH EVERY DAY 09/16/21   Runell GessBerry, Jonathan J, MD  Coenzyme Q10 (COQ10) 200 MG CAPS Take 200 mg by mouth daily.    [provider]  famotidine (PEPCID) 20 MG tablet Take 1 tablet (20 mg total) by mouth daily. 01/06/22   Saguier, Ramon DredgeEdward, PA-C  fluticasone (FLONASE) 50 MCG/ACT nasal spray Place 2 sprays into both nostrils daily. 10/21/21   Saguier, Ramon DredgeEdward, PA-C  isosorbide mononitrate (IMDUR) 60 MG 24 hr tablet TAKE 1 TABLET BY MOUTH EVERY DAY 09/24/21   Runell GessBerry, Jonathan J, MD  levocetirizine (XYZAL) 5 MG tablet Take 1 tablet (5 mg total) by mouth every evening. 01/06/22   Saguier, Ramon DredgeEdward, PA-C  levofloxacin (LEVAQUIN) 750 MG tablet Take 750 mg by mouth daily. 01/02/22   [provider]  metFORMIN (GLUCOPHAGE) 500 MG tablet Take 1 tablet (500 mg total) by mouth 2 (two) times daily with a meal. 01/03/22   Saguier, Ramon DredgeEdward, PA-C  metoprolol tartrate  (LOPRESSOR) 50 MG tablet Take 1 tablet (50 mg total) by mouth daily. 01/17/22   Saguier, Ramon DredgeEdward, PA-C  nitroGLYCERIN (NITROSTAT) 0.4 MG SL tablet Place 1 tablet (0.4 mg total) under the tongue every 5 (five) minutes as needed for chest pain. 11/12/21   Azalee CourseMeng, Hao, PA  Omega-3 Fatty Acids (FISH OIL) 1200 MG CAPS Take 2 capsules (2,400 mg total) by mouth every morning. 04/14/17   Arty Baumgartneroberts, Lindsay B, NP  oxyCODONE-acetaminophen (PERCOCET/ROXICET) 5-325 MG tablet Take 1 tablet by mouth every 6 (six) hours as needed for severe pain. 01/30/22   Baglia, Corrina, PA-C  PRALUENT 150 MG/ML SOAJ INJECT 150 MG INTO THE SKIN EVERY 14 (FOURTEEN) DAYS. 07/08/21   Runell GessBerry, Jonathan J, MD  silver sulfADIAZINE (SILVADENE) 1 % cream Apply 1 Application topically 2 (two) times daily. 12/12/21   Saguier, Ramon DredgeEdward, PA-C      Allergies    Crestor [rosuvastatin], Lipitor [atorvastatin], and Lisinopril    Review of Systems   Review of Systems  Constitutional:  Negative for chills, fatigue and fever.  HENT:  Negative for congestion.   Respiratory:  Negative for cough, chest tightness, shortness of breath and wheezing.   Cardiovascular:  Negative for chest pain and palpitations.  Gastrointestinal:  Positive for nausea (resolved). Negative for abdominal pain, constipation, diarrhea and vomiting.  Genitourinary:  Negative for dysuria and frequency.  Musculoskeletal:  Negative for back pain, neck pain and neck stiffness.  Skin:  Positive for wound (well appearing).  Neurological:  Negative for light-headedness and headaches.  Psychiatric/Behavioral:  Negative for agitation and confusion.   All other systems reviewed and are negative.   Physical Exam Updated Vital Signs BP (!) 109/57 (BP Location: Right Arm)   Pulse 79   Temp 98.7 F (37.1 C) (Oral)   Resp 20   SpO2 96%  Physical Exam Vitals and nursing note reviewed.  Constitutional:      General: He is not in acute distress.    Appearance: He is well-developed. He is  not ill-appearing, toxic-appearing or diaphoretic.  HENT:     Head: Normocephalic and atraumatic.     Mouth/Throat:     Mouth: Mucous membranes are moist.  Eyes:     Extraocular Movements: Extraocular movements intact.     Conjunctiva/sclera: Conjunctivae normal.     Pupils: Pupils are equal, round, and reactive to light.  Cardiovascular:     Rate and Rhythm: Normal rate and regular rhythm.     Heart sounds: No murmur heard. Pulmonary:     Effort: Pulmonary effort is normal. No respiratory distress.     Breath sounds: Normal breath sounds. No wheezing, rhonchi or rales.  Chest:     Chest wall: No tenderness.  Abdominal:     Palpations: Abdomen is soft.     Tenderness: There is no abdominal tenderness. There is no guarding or rebound.  Musculoskeletal:        General: No swelling or tenderness.     Cervical back: Neck supple. No tenderness.     Right lower leg: No edema.     Left lower leg: Edema present.  Skin:    General: Skin is warm and dry.     Capillary Refill: Capillary refill takes less than 2 seconds.     Findings: No erythema or rash.  Neurological:     General: No focal deficit present.     Mental Status: He is alert.     Sensory: No sensory deficit.     Motor: No weakness.  Psychiatric:        Mood and Affect: Mood normal.     ED Results / Procedures / Treatments   Labs (all labs ordered are listed, but only abnormal results are displayed) Labs Reviewed  BASIC METABOLIC PANEL - Abnormal; Notable for the following components:      Result Value   Glucose, Bld 123 (*)    Calcium 8.7 (*)    All other components within normal limits  CBC - Abnormal; Notable for the following components:   WBC 10.6 (*)    RBC 3.24 (*)    Hemoglobin 9.4 (*)    HCT 28.8 (*)    All other components within normal limits  HEPATIC FUNCTION PANEL - Abnormal; Notable for the following components:   Total Protein 6.0 (*)    Albumin 3.3 (*)    Total Bilirubin 1.4 (*)    Bilirubin,  Direct 0.3 (*)    Indirect Bilirubin 1.1 (*)    All other components within normal limits  GLUCOSE, CAPILLARY - Abnormal; Notable for the following components:   Glucose-Capillary 151 (*)    All other components within normal limits  TROPONIN I (HIGH SENSITIVITY) - Abnormal; Notable for the following components:   Troponin I (High Sensitivity) 56 (*)    All other components within normal limits  TROPONIN I (HIGH SENSITIVITY) - Abnormal;  Notable for the following components:   Troponin I (High Sensitivity) 55 (*)    All other components within normal limits  LIPASE, BLOOD    EKG EKG Interpretation  Date/Time:  Tuesday February 04 2022 14:56:01 EDT Ventricular Rate:  79 PR Interval:  184 QRS Duration: 89 QT Interval:  377 QTC Calculation: 433 R Axis:   53 Text Interpretation: Sinus rhythm When compared to prior, similar appearance. No STEMI Confirmed by Antony Blackbird [phone]) on 02/04/2022 3:11:54 PM  Radiology CT Angio Chest PE W and/or Wo Contrast  Result Date: 02/04/2022 CLINICAL DATA:  Pulmonary embolism (PE) suspected, high prob. 81 y/o male states he went to his PCP this morning and was told he has an elevated troponin level. Pt was originally at PCP to be evaluated for intermittent left sided chest pain that has been going on x 1 yearhat EXAM: CT ANGIOGRAPHY CHEST WITH CONTRAST TECHNIQUE: Multidetector CT imaging of the chest was performed using the standard protocol during bolus administration of intravenous contrast. Multiplanar CT image reconstructions and MIPs were obtained to evaluate the vascular anatomy. RADIATION DOSE REDUCTION: This exam was performed according to the departmental dose-optimization program which includes automated exposure control, adjustment of the mA and/or kV according to patient size and/or use of iterative reconstruction technique. CONTRAST:  82mL OMNIPAQUE IOHEXOL 350 MG/ML SOLN COMPARISON:  Chest x-ray 02/04/2022 FINDINGS: Cardiovascular:  Satisfactory opacification of the pulmonary arteries to the segmental level. No evidence of pulmonary embolism. Limited evaluation of the subsegmental level due to timing of contrast and respiratory motion artifact. Normal heart size. No significant pericardial effusion. The thoracic aorta is normal in caliber. Severe atherosclerotic plaque of the thoracic aorta. Four-vessel coronary artery calcifications status post coronary artery bypass graft. Mediastinum/Nodes: Multiple prominent mediastinal lymph nodes including an enlarged 1.2 cm right paratracheal lymph node (6:103) and posterior mediastinal paraesophageal pericentimeter lymph nodes (4: 49, 51). Enlarged right interlobular lymph node measuring 1.4 cm (6:167). No hilar or axillary lymph nodes. Thyroid gland, trachea, and esophagus demonstrate no significant findings. Tiny hiatal hernia. Lungs/Pleura: Moderate centrilobular emphysematous changes. No focal consolidation. No pulmonary nodule. No pulmonary mass. No pleural effusion. No pneumothorax. Upper Abdomen: No acute abnormality. Punctate left calcification within the kidney that may be vascular. Atherosclerotic plaque. Musculoskeletal: No chest wall abnormality. No suspicious lytic or blastic osseous lesions. No acute displaced fracture. Multilevel degenerative changes of the spine. Review of the MIP images confirms the above findings. IMPRESSION: 1. No central or segmental pulmonary embolus. Limited evaluation more distally due to artifact and timing of contrast. 2. Indeterminate mediastinal and right interlobular lymphadenopathy. Multiple prominent mediastinal lymph nodes otherwise. Recommend attention on follow-up. 3. Aortic Atherosclerosis (ICD10-I70.0) and Emphysema (ICD10-J43.9). Electronically Signed   By: Iven Finn M.D.   On: 02/04/2022 19:52   US Venous Img Lower Unilateral Left  Result Date: 02/04/2022 CLINICAL DATA:  Left lower extremity swelling. EXAM: LEFT LOWER EXTREMITY VENOUS  DOPPLER ULTRASOUND TECHNIQUE: Gray-scale sonography with compression, as well as color and duplex ultrasound, were performed to evaluate the deep venous system(s) from the level of the common femoral vein through the popliteal and proximal calf veins. COMPARISON:  None Available. FINDINGS: VENOUS Normal compressibility of the LEFT common femoral, LEFT superficial femoral, and LEFT popliteal veins. No filling defects to suggest DVT on grayscale or color Doppler imaging. Doppler waveforms show normal direction of venous flow, normal respiratory plasticity and response to augmentation. Limited views of the contralateral common femoral vein are unremarkable. OTHER A 5.4 cm hypoechoic area  is seen within the region of the LEFT groin. No abnormal flow is seen within this region on color Doppler evaluation. Edematous soft tissue is seen throughout the LEFT ankle. Limitations: The LEFT great saphenous vein, LEFT posterior tibial vein and left peroneal vein are poorly visualized. IMPRESSION: 1. Limited study, as described above, without evidence of DVT within the LEFT lower extremity. 2. Findings which may represent a LEFT groin hematoma. Correlation with physical examination is recommended. Additional CT evaluation should be considered. 3. LEFT ankle soft tissue edema. Electronically Signed   By: Aram Candela M.D.   On: 02/04/2022 19:27   DG Chest 2 View  Result Date: 02/04/2022 CLINICAL DATA:  Chest pain. EXAM: CHEST - 2 VIEW COMPARISON:  October 21, 2021. FINDINGS: The heart size and mediastinal contours are within normal limits. Both lungs are clear. Status post coronary artery bypass graft. The visualized skeletal structures are unremarkable. IMPRESSION: No active cardiopulmonary disease. Electronically Signed   By: Lupita Raider M.D.   On: 02/04/2022 15:29    Procedures Procedures    Medications Ordered in ED Medications - No data to display  ED Course/ Medical Decision Making/ A&P                            Medical Decision Making Amount and/or Complexity of Data Reviewed Labs: ordered. Radiology: ordered.  Risk Prescription drug management. Decision regarding hospitalization.    [name] is a 81 y.o. male with a past medical history significant for CAD status post CABG, hypertension, dyslipidemia, peripheral arterial disease, and recent femoropopliteal bypass and femoral endarterectomy on 01/28/2022, 7 days ago, who presents from his PCP for elevated troponin in the setting of chest discomfort.  Patient reports that for several months he has been having on and off chest discomfort in his left central chest that is pressure-like and is intermittent.  He reports they typically last between 10 and 20 minutes and then go away.  He reports that over the last 2 weeks they have been more severe and much more frequent.  He reports that over the last several days they have been daily and lasting longer.  He reports the pain gets up to a moderate to severe pressure and aching and does not radiate.  He denies vomiting but does report some nausea.  Denies shortness of breath, palpitations, lightheadedness, or syncope.  Denies any trauma.  Denies rashes to suggest shingles.  He reports he has not had this pain before and he does not remember if he had any pain with his previous MI requiring CABG.  He reports he is having some left leg swelling after his procedure but otherwise he was told to expect that.  He denies history of DVT or PE and he says he is on anticoagulation after his procedure.  He was having the symptoms of chest discomfort long before having his recent surgery.  Also note, the episode the patient had 2 days ago was so severe he ended up taking a nitroglycerin that completely resolved his symptoms quickly.  Patient went to PCP where he had a troponin that was checked and was found to be 63 this is much more elevated than several months ago when it was normal.  On exam, lungs clear and  chest is nontender to palpation.  He reports that sometimes it does hurt in his chest with palpation.  No murmur appreciated on my auscultation.  Good pulses in extremities.  Left leg is swollen as expected.  Patient's wounds were examined and there was no significant erythema, purulence, drainage, or crepitance.  Abdomen was nontender and back was nontender.  Patient otherwise well-appearing.  EKG did not show STEMI.  Given the patient's history of CAD with CABG and this intermittent chest discomfort with a now positive troponin, I anticipate we will get repeat labs and have a discussion with cardiology to discuss a plan.     5:15 PM Patient remained symptom-free.  His troponin returned at 56 which is elevated compared to his baseline but a slight increase from earlier today.  Due to his history of CAD with CABG, the intermittent left chest discomfort was better with nitro, and this positive troponin, will call cardiology and discuss a plan.  5:35 PM Just moved to cardiology.  They requested we see what the third troponin that is currently in process shows and then call them back to discuss if patient needs admission.  6:03 PM Third troponin returned and is still elevated at 55 from 56 from 63.  Cardiology reviewed the chart and is concerned he could have some mild MI that may have been worsened by his recent surgery as his chest pains preceded the procedure.  As he is chest pain-free and troponins are downtrending they wanted to hold on heparin but do agree that he needs to be admitted to [facility] so they can see patient.  They feel a medicine service would best serve him given his recent surgery with vascular surgery.  They will see patient in consultation and discuss stress test versus cath versus other advanced imaging of his heart.  Medicine will be called for admission at [facility].  6:38 PM Spoke to admitting hospitalist team and they agreed to admit but they request a DVT ultrasound and a CT  PE study as he is on antiplatelet medication does not appear, and I coagulation otherwise.  We will put these in and they will admit for further management.         Final Clinical Impression(s) / ED Diagnoses Final diagnoses:  Chest pain, unspecified type  Elevated troponin    Clinical Impression: 1. Chest pain, unspecified type   2. Elevated troponin     Disposition: Admit  This note was prepared with assistance of Dragon voice recognition software. Occasional wrong-word or sound-a-like substitutions may have occurred due to the inherent limitations of voice recognition software.     Kyndall Chaplin, Canary Brim, MD 02/04/22 [phone]

## 2022-02-04 NOTE — Patient Instructions (Addendum)
No chest pain presently but having more recurrent angina type pain recently. Last time had pain this am more than 6 hours. No nitro used. Some costochondral junction tenderness on palpation.  Will get one set of troponin stat now. If elevation will advise ED evaluation for further repeat studies.  Follow cardiologist advise on using nitro. If pain not subsiding be seen in ED.   Will go ahead and refer back to cardiologist in light of increased frequency of chest pain.  Htn- bp well controlled.   Dyslipidemia History of hyperlipidemia on Praluent   Left foot by inspection does appear to be healing. See wound care tomorrow.  Left groin wound from surgery looks cleared minimal moist discharge present. Will get wound culture.  For diabetes eat low sugar diet, check sugars twice daily(one fasting in am and other post meal). Continue metformin.  Follow up in 2 weeks or sooner if needed.

## 2022-02-05 ENCOUNTER — Encounter (HOSPITAL_BASED_OUTPATIENT_CLINIC_OR_DEPARTMENT_OTHER): Payer: Medicare HMO | Admitting: Physician Assistant

## 2022-02-05 ENCOUNTER — Observation Stay (HOSPITAL_BASED_OUTPATIENT_CLINIC_OR_DEPARTMENT_OTHER): Payer: Medicare HMO

## 2022-02-05 ENCOUNTER — Encounter (HOSPITAL_COMMUNITY): Payer: Self-pay | Admitting: Internal Medicine

## 2022-02-05 DIAGNOSIS — I739 Peripheral vascular disease, unspecified: Secondary | ICD-10-CM

## 2022-02-05 DIAGNOSIS — R0789 Other chest pain: Secondary | ICD-10-CM | POA: Diagnosis not present

## 2022-02-05 DIAGNOSIS — E119 Type 2 diabetes mellitus without complications: Secondary | ICD-10-CM | POA: Diagnosis not present

## 2022-02-05 DIAGNOSIS — E785 Hyperlipidemia, unspecified: Secondary | ICD-10-CM | POA: Diagnosis not present

## 2022-02-05 DIAGNOSIS — Z7982 Long term (current) use of aspirin: Secondary | ICD-10-CM | POA: Diagnosis not present

## 2022-02-05 DIAGNOSIS — I10 Essential (primary) hypertension: Secondary | ICD-10-CM

## 2022-02-05 DIAGNOSIS — I2510 Atherosclerotic heart disease of native coronary artery without angina pectoris: Secondary | ICD-10-CM

## 2022-02-05 DIAGNOSIS — R778 Other specified abnormalities of plasma proteins: Secondary | ICD-10-CM | POA: Diagnosis not present

## 2022-02-05 DIAGNOSIS — R079 Chest pain, unspecified: Secondary | ICD-10-CM

## 2022-02-05 DIAGNOSIS — Z23 Encounter for immunization: Secondary | ICD-10-CM | POA: Diagnosis not present

## 2022-02-05 DIAGNOSIS — Z79899 Other long term (current) drug therapy: Secondary | ICD-10-CM | POA: Diagnosis not present

## 2022-02-05 DIAGNOSIS — I25700 Atherosclerosis of coronary artery bypass graft(s), unspecified, with unstable angina pectoris: Secondary | ICD-10-CM

## 2022-02-05 DIAGNOSIS — D649 Anemia, unspecified: Secondary | ICD-10-CM | POA: Diagnosis present

## 2022-02-05 LAB — COMPREHENSIVE METABOLIC PANEL
ALT: 14 U/L (ref 0–44)
AST: 21 U/L (ref 15–41)
Albumin: 3 g/dL — ABNORMAL LOW (ref 3.5–5.0)
Alkaline Phosphatase: 46 U/L (ref 38–126)
Anion gap: 8 (ref 5–15)
BUN: 12 mg/dL (ref 8–23)
CO2: 25 mmol/L (ref 22–32)
Calcium: 8.6 mg/dL — ABNORMAL LOW (ref 8.9–10.3)
Chloride: 107 mmol/L (ref 98–111)
Creatinine, Ser: 0.91 mg/dL (ref 0.61–1.24)
GFR, Estimated: >60 mL/min (ref >60)
Glucose, Bld: 140 mg/dL — ABNORMAL HIGH (ref 70–99)
Potassium: 3.4 mmol/L — ABNORMAL LOW (ref 3.5–5.1)
Sodium: 140 mmol/L (ref 135–145)
Total Bilirubin: 1.4 mg/dL — ABNORMAL HIGH (ref 0.3–1.2)
Total Protein: 5.6 g/dL — ABNORMAL LOW (ref 6.5–8.1)

## 2022-02-05 LAB — VITAMIN B12: Vitamin B-12: 192 pg/mL (ref 180–914)

## 2022-02-05 LAB — ECHOCARDIOGRAM COMPLETE
AR max vel: 2.73 cm2
AV Area VTI: 2.62 cm2
AV Area mean vel: 2.47 cm2
AV Mean grad: 4 mmHg
AV Peak grad: 7.4 mmHg
Ao pk vel: 1.36 m/s
Area-P 1/2: 4.54 cm2
Height: 73 in
S' Lateral: 4.1 cm
Weight: 3435.65 oz

## 2022-02-05 LAB — GLUCOSE, CAPILLARY
Glucose-Capillary: 109 mg/dL — ABNORMAL HIGH (ref 70–99)
Glucose-Capillary: 125 mg/dL — ABNORMAL HIGH (ref 70–99)
Glucose-Capillary: 128 mg/dL — ABNORMAL HIGH (ref 70–99)
Glucose-Capillary: 132 mg/dL — ABNORMAL HIGH (ref 70–99)
Glucose-Capillary: 139 mg/dL — ABNORMAL HIGH (ref 70–99)

## 2022-02-05 LAB — TROPONIN I (HIGH SENSITIVITY)
Troponin I (High Sensitivity): 47 ng/L — ABNORMAL HIGH (ref <18)
Troponin I (High Sensitivity): 44 ng/L — ABNORMAL HIGH (ref <18)

## 2022-02-05 LAB — IRON AND TIBC
Iron: 33 ug/dL — ABNORMAL LOW (ref 45–182)
Saturation Ratios: 11 % — ABNORMAL LOW (ref 17.9–39.5)
TIBC: 291 ug/dL (ref 250–450)
UIBC: 258 ug/dL

## 2022-02-05 LAB — CBC
HCT: 27.4 % — ABNORMAL LOW (ref 39.0–52.0)
Hemoglobin: 9 g/dL — ABNORMAL LOW (ref 13.0–17.0)
MCH: 29.4 pg (ref 26.0–34.0)
MCHC: 32.8 g/dL (ref 30.0–36.0)
MCV: 89.5 fL (ref 80.0–100.0)
Platelets: 242 10*3/uL (ref 150–400)
RBC: 3.06 MIL/uL — ABNORMAL LOW (ref 4.22–5.81)
RDW: 15 % (ref 11.5–15.5)
WBC: 8.6 10*3/uL (ref 4.0–10.5)
nRBC: 0 % (ref 0.0–0.2)

## 2022-02-05 LAB — FERRITIN: Ferritin: 76 ng/mL (ref 24–336)

## 2022-02-05 LAB — NM MYOCAR MULTI W/SPECT W/WALL MOTION / EF
Estimated workload: 0
Exercise duration (min): 5 min
MPHR: 141 {beats}/min
Peak HR: 92 {beats}/min
Percent HR: 62 %
Rest HR: 74 {beats}/min
Rest Nuclear Isotope Dose: 10.4 mCi
ST Depression (mm): 0 mm
Stress Nuclear Isotope Dose: 31.2 mCi
TID: 0.77

## 2022-02-05 LAB — LIPID PANEL
Cholesterol: 71 mg/dL (ref 0–200)
HDL: 34 mg/dL — ABNORMAL LOW (ref >40)
LDL Cholesterol: 20 mg/dL (ref 0–99)
Total CHOL/HDL Ratio: 2.1 RATIO
Triglycerides: 83 mg/dL (ref <150)
VLDL: 17 mg/dL (ref 0–40)

## 2022-02-05 LAB — PHOSPHORUS: Phosphorus: 3.9 mg/dL (ref 2.5–4.6)

## 2022-02-05 LAB — FOLATE: Folate: 8 ng/mL (ref >5.9)

## 2022-02-05 LAB — MAGNESIUM: Magnesium: 1.7 mg/dL (ref 1.7–2.4)

## 2022-02-05 LAB — PROTIME-INR
INR: 1.1 (ref 0.8–1.2)
Prothrombin Time: 14.4 seconds (ref 11.4–15.2)

## 2022-02-05 LAB — TSH: TSH: 2.213 u[IU]/mL (ref 0.350–4.500)

## 2022-02-05 LAB — RETICULOCYTES
Immature Retic Fract: 20.5 % — ABNORMAL HIGH (ref 2.3–15.9)
RBC.: 3.07 MIL/uL — ABNORMAL LOW (ref 4.22–5.81)
Retic Count, Absolute: 101.6 10*3/uL (ref 19.0–186.0)
Retic Ct Pct: 3.3 % — ABNORMAL HIGH (ref 0.4–3.1)

## 2022-02-05 MED ORDER — REGADENOSON 0.4 MG/5ML IV SOLN: 0.4000 mg | Freq: Once | INTRAVENOUS | Status: AC

## 2022-02-05 MED ORDER — ACETAMINOPHEN 325 MG PO TABS: 650.0000 mg | ORAL_TABLET | Freq: Four times a day (QID) | ORAL | Status: DC | PRN

## 2022-02-05 MED ORDER — INSULIN ASPART 100 UNIT/ML IJ SOLN
0.0000 [IU] | INTRAMUSCULAR | Status: DC
  Administered 2022-02-05 (×3): 1 [IU] via SUBCUTANEOUS
  Administered 2022-02-05 – 2022-02-06 (×2): 2 [IU] via SUBCUTANEOUS
  Administered 2022-02-06: 1 [IU] via SUBCUTANEOUS

## 2022-02-05 MED ORDER — PNEUMOCOCCAL 20-VAL CONJ VACC 0.5 ML IM SUSY
0.5000 mL | PREFILLED_SYRINGE | INTRAMUSCULAR | Status: AC | PRN
  Administered 2022-02-06: 0.5 mL via INTRAMUSCULAR
  Filled 2022-02-05: qty 0.5

## 2022-02-05 MED ORDER — SODIUM CHLORIDE 0.9 % IV SOLN: 250.0000 mL | INTRAVENOUS | Status: DC | PRN

## 2022-02-05 MED ORDER — SODIUM CHLORIDE 0.9% FLUSH: 3.0000 mL | INTRAVENOUS | Status: DC | PRN

## 2022-02-05 MED ORDER — ASPIRIN 81 MG PO CHEW
81.0000 mg | CHEWABLE_TABLET | Freq: Every day | ORAL | Status: DC
  Administered 2022-02-05 – 2022-02-06 (×2): 81 mg via ORAL
  Filled 2022-02-05 (×2): qty 1

## 2022-02-05 MED ORDER — EZETIMIBE 10 MG PO TABS: 10.0000 mg | ORAL_TABLET | Freq: Every day | ORAL | Status: DC

## 2022-02-05 MED ORDER — FAMOTIDINE 20 MG PO TABS
20.0000 mg | ORAL_TABLET | Freq: Every day | ORAL | Status: DC
  Administered 2022-02-05 – 2022-02-06 (×2): 20 mg via ORAL
  Filled 2022-02-05 (×2): qty 1

## 2022-02-05 MED ORDER — HYDROCODONE-ACETAMINOPHEN 5-325 MG PO TABS: 1.0000 | ORAL_TABLET | ORAL | Status: DC | PRN

## 2022-02-05 MED ORDER — METOPROLOL SUCCINATE ER 50 MG PO TB24
50.0000 mg | ORAL_TABLET | Freq: Two times a day (BID) | ORAL | Status: DC
  Administered 2022-02-05 – 2022-02-06 (×2): 50 mg via ORAL
  Filled 2022-02-05 (×2): qty 1

## 2022-02-05 MED ORDER — SODIUM CHLORIDE 0.9% FLUSH
3.0000 mL | Freq: Two times a day (BID) | INTRAVENOUS | Status: DC
  Administered 2022-02-05 – 2022-02-06 (×4): 3 mL via INTRAVENOUS

## 2022-02-05 MED ORDER — METOPROLOL SUCCINATE ER 50 MG PO TB24: 50.0000 mg | ORAL_TABLET | Freq: Every day | ORAL | Status: DC

## 2022-02-05 MED ORDER — MAGNESIUM OXIDE -MG SUPPLEMENT 400 (240 MG) MG PO TABS
400.0000 mg | ORAL_TABLET | Freq: Two times a day (BID) | ORAL | Status: AC
  Administered 2022-02-05 (×2): 400 mg via ORAL
  Filled 2022-02-05 (×2): qty 1

## 2022-02-05 MED ORDER — ISOSORBIDE MONONITRATE ER 60 MG PO TB24
60.0000 mg | ORAL_TABLET | Freq: Every day | ORAL | Status: DC
  Administered 2022-02-05 – 2022-02-06 (×2): 60 mg via ORAL
  Filled 2022-02-05 (×2): qty 1

## 2022-02-05 MED ORDER — AMLODIPINE BESYLATE 10 MG PO TABS
10.0000 mg | ORAL_TABLET | Freq: Every day | ORAL | Status: DC
  Administered 2022-02-05 – 2022-02-06 (×2): 10 mg via ORAL
  Filled 2022-02-05 (×2): qty 1

## 2022-02-05 MED ORDER — TECHNETIUM TC 99M TETROFOSMIN IV KIT
31.2000 | PACK | Freq: Once | INTRAVENOUS | Status: AC | PRN
  Administered 2022-02-05: 31.2 via INTRAVENOUS

## 2022-02-05 MED ORDER — METOPROLOL TARTRATE 50 MG PO TABS: 50.0000 mg | ORAL_TABLET | Freq: Every day | ORAL | Status: DC

## 2022-02-05 MED ORDER — TECHNETIUM TC 99M TETROFOSMIN IV KIT
10.4000 | PACK | Freq: Once | INTRAVENOUS | Status: AC | PRN
  Administered 2022-02-05: 10.4 via INTRAVENOUS

## 2022-02-05 MED ORDER — REGADENOSON 0.4 MG/5ML IV SOLN
INTRAVENOUS | Status: AC
  Administered 2022-02-05: 0.4 mg via INTRAVENOUS
  Filled 2022-02-05: qty 5

## 2022-02-05 MED ORDER — CLOPIDOGREL BISULFATE 75 MG PO TABS
75.0000 mg | ORAL_TABLET | Freq: Every day | ORAL | Status: DC
  Administered 2022-02-05 – 2022-02-06 (×2): 75 mg via ORAL
  Filled 2022-02-05 (×2): qty 1

## 2022-02-05 MED ORDER — ACETAMINOPHEN 650 MG RE SUPP: 650.0000 mg | Freq: Four times a day (QID) | RECTAL | Status: DC | PRN

## 2022-02-05 MED ORDER — POTASSIUM CHLORIDE CRYS ER 20 MEQ PO TBCR
40.0000 meq | EXTENDED_RELEASE_TABLET | Freq: Two times a day (BID) | ORAL | Status: AC
  Administered 2022-02-05 (×2): 40 meq via ORAL
  Filled 2022-02-05 (×2): qty 2

## 2022-02-05 NOTE — Assessment & Plan Note (Signed)
Obtain anemia panel in a.m. Hemoccult stool

## 2022-02-05 NOTE — H&P (Signed)
[name] QQP:[id number] DOB: October 17, 1940 DOA: 02/04/2022     PCP: Richard Richters, PA-C   Outpatient Specialists:  CARDS:  Dr. Allyson Sabal   Vascular [facility] Heinz  Patient arrived to ER on 02/04/22 at 1429 Referred by Attending Therisa Doyne, MD   Patient coming from:    home Lives  With family    Chief Complaint:   Chief Complaint  Patient presents with   Abnormal Lab    HPI: [name] is a 81 y.o. male with medical history significant of  PAD, HTN, CAD sp CABG, HLD, DM2    Presented with   elevated troponin and CP Has been having intermittent chest pain getting somewhat worse have seen cardiology in the past for the same.  Chest pain responding to nitro. At [facility] noted to have troponin elevated at 63 then 50.  Cardiology was consulted recommending admission for chest pain evaluation. Check pain at this time has been resolved. Recently diagnosed with diabetes Patient with known history of PAD recently undergone femoropopliteal bypass. If left lower extremity edema still present and mild drainage from incision.   Has been having CP off and on for 1 year This is no the first time he told his PCP he is having chest pain  He has been taking his BP meds He does not know what he takes  Denies fever no black stool no blood in stool Does not smoke nor drink CP occures at rest , lasts 10 min, no SOB but occasional nausea   Regarding pertinent Chronic problems:     Hyperlipidemia - on Praluent  Lipid Panel     Component Value Date/Time   CHOL 70 01/28/2022 0353   CHOL 72 (L) 10/09/2020 0906   TRIG 75 01/28/2022 0353   HDL 31 (L) 01/28/2022 0353   HDL 42 10/09/2020 0906   CHOLHDL 2.3 01/28/2022 0353   VLDL 15 01/28/2022 0353   LDLCALC 24 01/28/2022 0353   LDLCALC 15 10/09/2020 0906   LABVLDL 15 10/09/2020 0906     HTN on imdur, toprol,       CAD  - On Aspirin, betablocker, Plavix                 -  followed by cardiology                      DM  2 -  Lab Results  Component Value Date   HGBA1C 7.3 (H) 01/02/2022   on PO meds only,       Chronic anemia - baseline hg Hemoglobin & Hematocrit  Recent Labs    01/28/22 1511 01/29/22 0412 02/04/22 1457  HGB 9.9* 9.2* 9.4*     While in ER:   Trop down trending  Cardiology was made aware   Doppler neg for dvt  CXR -  NON acute   CTA chest -  non-acute, no PE,  no evidence of infiltrate, lymphadenopathy  Following Medications were ordered in ER: Medications  iohexol (OMNIPAQUE) 350 MG/ML injection 75 mL (75 mLs Intravenous Contrast Given 02/04/22 1919)    _______________________________________________________ ER Provider Called:     Cardiology  They Recommend admit to medicine     ED Triage Vitals  Enc Vitals Group     BP 02/04/22 1447 (!) 109/57     Pulse Rate 02/04/22 1447 79     Resp 02/04/22 1447 20     Temp 02/04/22 1447 98.7 F (37.1 C)  Temp Source 02/04/22 1447 Oral     SpO2 02/04/22 1447 96 %     Weight 02/04/22 2304 214 lb 11.7 oz (97.4 kg)     Height 02/04/22 2304  (1.854 m)     Head Circumference --      Peak Flow --      Pain Score 02/04/22 1448 0     Pain Loc --      Pain Edu? --      Excl. in GC? --   TMAX(24)@     _________________________________________ Significant initial  Findings: Abnormal Labs Reviewed  BASIC METABOLIC PANEL - Abnormal; Notable for the following components:      Result Value   Glucose, Bld 123 (*)    Calcium 8.7 (*)    All other components within normal limits  CBC - Abnormal; Notable for the following components:   WBC 10.6 (*)    RBC 3.24 (*)    Hemoglobin 9.4 (*)    HCT 28.8 (*)    All other components within normal limits  HEPATIC FUNCTION PANEL - Abnormal; Notable for the following components:   Total Protein 6.0 (*)    Albumin 3.3 (*)    Total Bilirubin 1.4 (*)    Bilirubin, Direct 0.3 (*)    Indirect Bilirubin 1.1 (*)    All other components within normal limits  GLUCOSE, CAPILLARY -  Abnormal; Notable for the following components:   Glucose-Capillary 151 (*)    All other components within normal limits  TROPONIN I (HIGH SENSITIVITY) - Abnormal; Notable for the following components:   Troponin I (High Sensitivity) 56 (*)    All other components within normal limits  TROPONIN I (HIGH SENSITIVITY) - Abnormal; Notable for the following components:   Troponin I (High Sensitivity) 55 (*)    All other components within normal limits   _____________________ Troponin 65 -55 - 53 ECG: Ordered Personally reviewed and interpreted by me showing: HR : 79 Rhythm:  NSR,   no evidence of ischemic changes QTC 433    The recent clinical data is shown below. Vitals:   02/04/22 2100 02/04/22 2200 02/04/22 2230 02/04/22 2304  BP: 133/61 117/68 116/65 138/67  Pulse: 88 84 85 83  Resp: (!) (!) 22  Temp:   98.2 F (36.8 C) 98.2 F (36.8 C)  TempSrc:    Oral  SpO2: 97% 96% 96% 96%  Weight:    97.4 kg  Height:     (1.854 m)      WBC     Component Value Date/Time   WBC 10.6 (H) 02/04/2022 1457   LYMPHSABS 2.3 12/16/2021 1050   LYMPHSABS 2.2 04/08/2017 0824   MONOABS 0.8 12/16/2021 1050   EOSABS 0.2 12/16/2021 1050   EOSABS 0.3 04/08/2017 0824   BASOSABS 0.1 12/16/2021 1050   BASOSABS 0.0 04/08/2017 0824    Urine analysis: No results found for: "COLORURINE", "APPEARANCEUR", "LABSPEC", "PHURINE", "GLUCOSEU", "HGBUR", "BILIRUBINUR", "KETONESUR", "PROTEINUR", "UROBILINOGEN", "NITRITE", "LEUKOCYTESUR"  Results for orders placed or performed during the hospital encounter of 01/27/22  Surgical pcr screen     Status: None   Collection Time: 01/28/22  9:26 AM   Specimen: Nasal Mucosa; Nasal Swab  Result Value Ref Range Status   MRSA, PCR NEGATIVE NEGATIVE Final   Staphylococcus aureus NEGATIVE NEGATIVE Final    Comment: (NOTE) The Xpert SA Assay (FDA approved for NASAL specimens in patients 16 years of age and older), is one component of a  comprehensive surveillance program. It is not intended to diagnose infection nor to guide or monitor treatment. Performed at [facility], [place] [address] [place], [place] [address]      _______________________________________________ Hospitalist was called for admission for   Chest pain, unspecified type    Elevated troponin stable  The following Work up has been ordered so far:  Orders Placed This Encounter  Procedures   DG Chest 2 View   US Venous Img Lower Unilateral Left   CT Angio Chest PE W and/or Wo Contrast   Basic metabolic panel   CBC   Lipase, blood   Hepatic function panel   Glucose, capillary   Cardiac monitoring   Cardiac monitoring   Inpatient consult to Cardiology   Consult to hospitalist   ED EKG   Place in observation (patient's expected length of stay will be less than 2 midnights)     OTHER Significant initial  Findings:  labs showing:    Recent Labs  Lab 01/29/22 0412 02/04/22 1101 02/04/22 1457  NA 139 141 139  K 3.7 3.6 3.5  CO2 24 26 24   GLUCOSE 154* 140* 123*  BUN 9 14 14   CREATININE 0.65 0.72 0.76  CALCIUM 7.7* 9.3 8.7*    Cr    stable,    Lab Results  Component Value Date   CREATININE 0.76 02/04/2022   CREATININE 0.72 02/04/2022   CREATININE 0.65 01/29/2022    Recent Labs  Lab 02/04/22 1101 02/04/22 1457  AST 20 26  ALT 15 16  ALKPHOS 60 60  BILITOT 1.5* 1.4*  PROT 6.4 6.0*  ALBUMIN 3.6 3.3*   Lab Results  Component Value Date   CALCIUM 8.7 (L) 02/04/2022        Plt: Lab Results  Component Value Date   PLT 277 02/04/2022    COVID-19 Labs  No results for input(s): "DDIMER", "FERRITIN", "LDH", "CRP" in the last 72 hours.  Lab Results  Component Value Date   SARSCOV2NAA NEGATIVE 06/26/2020      ABG    Component Value Date/Time   PHART 7.356 01/28/2022 1511   PCO2ART 45.4 01/28/2022 1511   PO2ART 99 01/28/2022 1511   HCO3 25.4 01/28/2022 1511   TCO2 27 01/28/2022 1511   ACIDBASEDEF 1.0  06/28/2020 1239   O2SAT 97 01/28/2022 1511         Recent Labs  Lab 01/29/22 0412 02/04/22 1457  WBC 9.4 10.6*  HGB 9.2* 9.4*  HCT 27.7* 28.8*  MCV 88.2 88.9  PLT 198 277    HG/HCT   Down  from baseline see below    Component Value Date/Time   HGB 9.4 (L) 02/04/2022 1457   HGB 12.7 (L) 11/27/2020 1523   HCT 28.8 (L) 02/04/2022 1457   HCT 37.6 11/27/2020 1523   MCV 88.9 02/04/2022 1457   MCV 89 11/27/2020 1523      Recent Labs  Lab 02/04/22 1457  LIPASE 40     .car BNP (last 3 results) Recent Labs    10/21/21 1024  BNP 30    DM  labs:  HbA1C: Recent Labs    01/02/22 0912  HGBA1C 7.3*      CBG (last 3)  Recent Labs    02/04/22 2317  GLUCAP 151*    Cultures: No results found for: "SDES", "SPECREQUEST", "CULT", "REPTSTATUS"   Radiological Exams on Admission: CT Angio Chest PE W and/or Wo Contrast  Result Date: 02/04/2022 CLINICAL DATA:  Pulmonary embolism (PE) suspected,  high prob. 81 y/o male states he went to his PCP this morning and was told he has an elevated troponin level. Pt was originally at PCP to be evaluated for intermittent left sided chest pain that has been going on x 1 yearhat EXAM: CT ANGIOGRAPHY CHEST WITH CONTRAST TECHNIQUE: Multidetector CT imaging of the chest was performed using the standard protocol during bolus administration of intravenous contrast. Multiplanar CT image reconstructions and MIPs were obtained to evaluate the vascular anatomy. RADIATION DOSE REDUCTION: This exam was performed according to the departmental dose-optimization program which includes automated exposure control, adjustment of the mA and/or kV according to patient size and/or use of iterative reconstruction technique. CONTRAST:  27mL OMNIPAQUE IOHEXOL 350 MG/ML SOLN COMPARISON:  Chest x-ray 02/04/2022 FINDINGS: Cardiovascular: Satisfactory opacification of the pulmonary arteries to the segmental level. No evidence of pulmonary embolism. Limited evaluation of the  subsegmental level due to timing of contrast and respiratory motion artifact. Normal heart size. No significant pericardial effusion. The thoracic aorta is normal in caliber. Severe atherosclerotic plaque of the thoracic aorta. Four-vessel coronary artery calcifications status post coronary artery bypass graft. Mediastinum/Nodes: Multiple prominent mediastinal lymph nodes including an enlarged 1.2 cm right paratracheal lymph node (6:103) and posterior mediastinal paraesophageal pericentimeter lymph nodes (4: 49, 51). Enlarged right interlobular lymph node measuring 1.4 cm (6:167). No hilar or axillary lymph nodes. Thyroid gland, trachea, and esophagus demonstrate no significant findings. Tiny hiatal hernia. Lungs/Pleura: Moderate centrilobular emphysematous changes. No focal consolidation. No pulmonary nodule. No pulmonary mass. No pleural effusion. No pneumothorax. Upper Abdomen: No acute abnormality. Punctate left calcification within the kidney that may be vascular. Atherosclerotic plaque. Musculoskeletal: No chest wall abnormality. No suspicious lytic or blastic osseous lesions. No acute displaced fracture. Multilevel degenerative changes of the spine. Review of the MIP images confirms the above findings. IMPRESSION: 1. No central or segmental pulmonary embolus. Limited evaluation more distally due to artifact and timing of contrast. 2. Indeterminate mediastinal and right interlobular lymphadenopathy. Multiple prominent mediastinal lymph nodes otherwise. Recommend attention on follow-up. 3. Aortic Atherosclerosis (ICD10-I70.0) and Emphysema (ICD10-J43.9). Electronically Signed   By: Tish Frederickson M.D.   On: 02/04/2022 19:52   US Venous Img Lower Unilateral Left  Result Date: 02/04/2022 CLINICAL DATA:  Left lower extremity swelling. EXAM: LEFT LOWER EXTREMITY VENOUS DOPPLER ULTRASOUND TECHNIQUE: Gray-scale sonography with compression, as well as color and duplex ultrasound, were performed to evaluate the  deep venous system(s) from the level of the common femoral vein through the popliteal and proximal calf veins. COMPARISON:  None Available. FINDINGS: VENOUS Normal compressibility of the LEFT common femoral, LEFT superficial femoral, and LEFT popliteal veins. No filling defects to suggest DVT on grayscale or color Doppler imaging. Doppler waveforms show normal direction of venous flow, normal respiratory plasticity and response to augmentation. Limited views of the contralateral common femoral vein are unremarkable. OTHER A 5.4 cm hypoechoic area is seen within the region of the LEFT groin. No abnormal flow is seen within this region on color Doppler evaluation. Edematous soft tissue is seen throughout the LEFT ankle. Limitations: The LEFT great saphenous vein, LEFT posterior tibial vein and left peroneal vein are poorly visualized. IMPRESSION: 1. Limited study, as described above, without evidence of DVT within the LEFT lower extremity. 2. Findings which may represent a LEFT groin hematoma. Correlation with physical examination is recommended. Additional CT evaluation should be considered. 3. LEFT ankle soft tissue edema. Electronically Signed   By: Aram Candela M.D.   On: 02/04/2022 19:27  DG Chest 2 View  Result Date: 02/04/2022 CLINICAL DATA:  Chest pain. EXAM: CHEST - 2 VIEW COMPARISON:  October 21, 2021. FINDINGS: The heart size and mediastinal contours are within normal limits. Both lungs are clear. Status post coronary artery bypass graft. The visualized skeletal structures are unremarkable. IMPRESSION: No active cardiopulmonary disease. Electronically Signed   By: Lupita Raider M.D.   On: 02/04/2022 15:29   _______________________________________________________________________________________________________ Latest   Blood pressure 138/67, pulse 83, temperature 98.2 F (36.8 C), temperature source Oral, resp. rate (!) 22, height  (1.854 m), weight 97.4 kg, SpO2 96 %.   Vitals  labs and  radiology finding personally reviewed  Review of Systems:    Pertinent positives include:  chest pain  Constitutional:  No weight loss, night sweats, Fevers, chills, fatigue, weight loss  HEENT:  No headaches, Difficulty swallowing,Tooth/dental problems,Sore throat,  No sneezing, itching, ear ache, nasal congestion, post nasal drip,  Cardio-vascular:  No , Orthopnea, PND, anasarca, dizziness, palpitations.no Bilateral lower extremity swelling  GI:  No heartburn, indigestion, abdominal pain, nausea, vomiting, diarrhea, change in bowel habits, loss of appetite, melena, blood in stool, hematemesis Resp:  no shortness of breath at rest. No dyspnea on exertion, No excess mucus, no productive cough, No non-productive cough, No coughing up of blood.No change in color of mucus.No wheezing. Skin:  no rash or lesions. No jaundice GU:  no dysuria, change in color of urine, no urgency or frequency. No straining to urinate.  No flank pain.  Musculoskeletal:  No joint pain or no joint swelling. No decreased range of motion. No back pain.  Psych:  No change in mood or affect. No depression or anxiety. No memory loss.  Neuro: no localizing neurological complaints, no tingling, no weakness, no double vision, no gait abnormality, no slurred speech, no confusion  All systems reviewed and apart from HOPI all are negative _______________________________________________________________________________________________ Past Medical History:   Past Medical History:  Diagnosis Date   Claudication (HCC) 06/27/2011   Left ABIs abnormal values with mild-moderate arterial insufficiency, right SFA moderate amount of mixed density plaque elevating velocities suggestive 50-69% diameter reduction   Coronary artery disease 06/27/2011   Normal Lexiscan, EF 64, post-stress EF 64, no EKG changes   Diabetes mellitus without complication (HCC)    prediabetic   Hyperlipidemia    Hypertension    Impotence of organic  origin    Osteoarthritis    Other testicular hypofunction    PVD (peripheral vascular disease) (HCC)       Past Surgical History:  Procedure Laterality Date   ABDOMINAL AORTOGRAM N/A 06/28/2020   Procedure: ABDOMINAL AORTOGRAM;  Surgeon: [name], MD;  Location: [facility];  Service: Cardiovascular;  Laterality: N/A;   ABDOMINAL AORTOGRAM W/LOWER EXTREMITY Left 01/27/2022   Procedure: ABDOMINAL AORTOGRAM W/LOWER EXTREMITY;  Surgeon: Maeola Harman, MD;  [facility]: [facility];  Service: Cardiovascular;  Laterality: Left;   CARDIAC CATHETERIZATION  12/26/2004   Left main-90% distal tapered stenosis, left circumflex-95% ostial stenosis, first marginal branch 99% ostial branch, posterior descending artery 99% ostial stenosis, 60-70% segmental mid stenosis, needed CABG   CORONARY ARTERY BYPASS GRAFT  12/27/2004   Left main 3-vessel disease, LIMA to LAD, saphenous vein to circumflexmarginal branch and to the PDA   ENDARTERECTOMY FEMORAL Left 01/28/2022   Procedure: LEFT ENDARTERECTOMYCOMMON  [facility] BELOW KNEE POPLITEAL;  Surgeon: Maeola Harman, MD;  Location: [facility] OR;  Service: Vascular;  Laterality: Left;   FEMORAL-POPLITEAL BYPASS  GRAFT Left 01/28/2022   Procedure: LEFT FEMORAL-POPLITEAL ARTERY BYPASS;  Surgeon: Maeola Harmanain, Brandon Christopher, MD;  [facility];  Service: Vascular;  Laterality: Left;   INGUINAL HERNIA REPAIR Left 2005   LOWER EXTREMITY ANGIOGRAPHY N/A 04/13/2017   Procedure: LOWER EXTREMITY ANGIOGRAPHY;  Surgeon: Runell GessBerry, Jonathan J, MD;  Location: [facility];  Service: Cardiovascular;  Laterality: N/A;   LOWER EXTREMITY ANGIOGRAPHY Bilateral 12/06/2020   Procedure: Lower Extremity Angiography;  Surgeon: Runell GessBerry, Jonathan J, MD;  [facility];  Service: Cardiovascular;  Laterality: Bilateral;  Limited Study   LOWER EXTREMITY INTERVENTION N/A 08/04/2016   Procedure: Lower Extremity Intervention;  Surgeon: Runell GessJonathan J  Berry, MD;  [facility];  Service: Cardiovascular;  Laterality: N/A;   PERIPHERAL VASCULAR ATHERECTOMY Left 04/13/2017   Procedure: PERIPHERAL VASCULAR ATHERECTOMY;  Surgeon: Runell GessBerry, Jonathan J, MD;  [facility];  Service: Cardiovascular;  Laterality: Left;  SFA   PERIPHERAL VASCULAR ATHERECTOMY  12/06/2020   Procedure: PERIPHERAL VASCULAR ATHERECTOMY;  Surgeon: Runell GessBerry, Jonathan J, MD;  [facility];  Service: Cardiovascular;;  Lt Iliac   PERIPHERAL VASCULAR BALLOON ANGIOPLASTY Left 04/13/2017   Procedure: PERIPHERAL VASCULAR BALLOON ANGIOPLASTY;  Surgeon: Runell GessBerry, Jonathan J, MD;  Location: [facility];  Service: Cardiovascular;  Laterality: Left;  SFA   PERIPHERAL VASCULAR INTERVENTION  12/06/2020   Procedure: PERIPHERAL VASCULAR INTERVENTION;  Surgeon: Runell GessBerry, Jonathan J, MD;  Location: [facility];  Service: Cardiovascular;;  Lt Iliac   RIGHT/LEFT HEART CATH AND CORONARY/GRAFT ANGIOGRAPHY N/A 06/28/2020   Procedure: RIGHT/LEFT HEART CATH AND CORONARY/GRAFT ANGIOGRAPHY;  Surgeon: Runell GessBerry, Jonathan J, MD;  Location: [facility];  Service: Cardiovascular;  Laterality: N/A;    Social History:  Ambulatory   independently       reports that he quit smoking about 17 years ago. His smoking use included cigarettes. He has a 50.00 pack-year smoking history. He has never used smokeless tobacco. He reports current alcohol use. He reports that he does not use drugs.     Family History:   Family History  Problem Relation Age of Onset   Hypertension Mother        [id number]   Clotting disorder Father        [id number]   Diabetes Brother        heart surgery   ______________________________________________________________________________________________ Allergies: Allergies  Allergen Reactions   Crestor [Rosuvastatin] Other (See Comments)    Myalgias   Lipitor [Atorvastatin]     myalgias   Lisinopril     cough     Prior to Admission  medications   Medication Sig Start Date End Date Taking? Authorizing Provider  amLODipine (NORVASC) 10 MG tablet TAKE 1 TABLET BY MOUTH EVERY DAY 08/26/21   Runell GessBerry, Jonathan J, MD  aspirin 81 MG tablet Take 81 mg by mouth daily.    [provider]  cephALEXin (KEFLEX) 500 MG capsule Take 1 capsule (500 mg total) by mouth 2 (two) times daily. 12/24/21   Saguier, Ramon DredgeEdward, PA-C  Cholecalciferol (VITAMIN D) 50 MCG (2000 UT) tablet Take 2,000 Units by mouth daily.    [provider]  clopidogrel (PLAVIX) 75 MG tablet TAKE 1 TABLET BY MOUTH EVERY DAY 09/16/21   Runell GessBerry, Jonathan J, MD  Coenzyme Q10 (COQ10) 200 MG CAPS Take 200 mg by mouth daily.    [provider]  famotidine (PEPCID) 20 MG tablet Take 1 tablet (20 mg total) by mouth daily. 01/06/22  Saguier, Ramon Dredge, PA-C  fluticasone [facility]) 50 MCG/ACT nasal spray Place 2 sprays into both nostrils daily. 10/21/21   Saguier, Ramon Dredge, PA-C  isosorbide mononitrate (IMDUR) 60 MG 24 hr tablet TAKE 1 TABLET BY MOUTH EVERY DAY 09/24/21   [name], MD  levocetirizine (XYZAL) 5 MG tablet Take 1 tablet (5 mg total) by mouth every evening. 01/06/22   Saguier, Ramon Dredge, PA-C  levofloxacin (LEVAQUIN) 750 MG tablet Take 750 mg by mouth daily. 01/02/22   [provider]  metFORMIN (GLUCOPHAGE) 500 MG tablet Take 1 tablet (500 mg total) by mouth 2 (two) times daily with a meal. 01/03/22   Saguier, Ramon Dredge, PA-C  metoprolol tartrate (LOPRESSOR) 50 MG tablet Take 1 tablet (50 mg total) by mouth daily. 01/17/22   Saguier, Ramon Dredge, PA-C  nitroGLYCERIN (NITROSTAT) 0.4 MG SL tablet Place 1 tablet (0.4 mg total) under the tongue every 5 (five) minutes as needed for chest pain. 11/12/21   Azalee Course, PA  Omega-3 Fatty Acids (FISH OIL) 1200 MG CAPS Take 2 capsules (2,400 mg total) by mouth every morning. 04/14/17   Arty Baumgartner, NP  oxyCODONE-acetaminophen (PERCOCET/ROXICET) 5-325 MG tablet Take 1 tablet by mouth every 6 (six) hours as needed for  severe pain. 01/30/22   Baglia, Corrina, PA-C  PRALUENT 150 MG/ML SOAJ INJECT 150 MG INTO THE SKIN EVERY 14 (FOURTEEN) DAYS. 07/08/21   [name], MD  silver sulfADIAZINE (SILVADENE) 1 % cream Apply 1 Application topically 2 (two) times daily. 12/12/21   Richard Richters, PA-C    ___________________________________________________________________________________________________ Physical Exam:    02/04/2022   11:04 PM 02/04/2022   10:30 PM 02/04/2022   10:00 PM  Vitals with BMI  Height     Weight 214 lbs 12 oz    BMI 28.34    Systolic 138 116 098  Diastolic 67 65 68  Pulse 83 85 84     1. General:  in No  Acute distress    Chronically ill   -appearing 2. Psychological: Alert and   Oriented 3. Head/ENT:   Moist  Mucous Membranes                          Head Non traumatic, neck supple                           Poor Dentition 4. SKIN: normal  Skin turgor,  Skin clean Dry and intact no rash swelling noted around incision sites  5. Heart: Regular rate and rhythm no  Murmur, no Rub or gallop 6. Lungs:  , no wheezes or crackles   7. Abdomen: Soft,  non-tender, Non distended   obese  bowel sounds present 8. Lower extremities: no clubbing, cyanosis,  edema Left leg 9. Neurologically Grossly intact, moving all 4 extremities equally   10. MSK: Normal range of motion    Chart has been reviewed  ______________________________________________________________________________________________  Assessment/Plan 81 y.o. male with medical history significant of  PAD, HTN, CAD sp CABG, HLD, DM2  Admitted for   Chest pain, unspecified type     Elevated troponin      Present on Admission:  Chest pain  HTN (hypertension)  Dyslipidemia  PAD (peripheral artery disease) (HCC)  Normocytic anemia     Chest pain Cardiology consult May need definitive treatment and/or repeat investigation.  Will make n.p.o. postmidnight continue to follow troponin currently appears to be stable  echogram ordered  HTN (hypertension) Continue Imdur at 60 mg p.o. daily and Lopressor at 50 mg p.o. daily  Dyslipidemia Check lipid panel  PAD (peripheral artery disease) (HCC) Status post operative intervention left common femoral, superficial femoral and profundafemoral endarterectomy with vein patch angioplasty; left popliteal, tibioperoneal trunk endarterectomy with vein patch angioplasty; left common femoral to below knee popliteal artery bypass with 6 mm ringed PTFE and harvest of left greater saphenous vein in the mid thigh and below the knee. continue aspirin and Plavix Please notify in a.m. vascular surgery patient's been admitted  Normocytic anemia Obtain anemia panel in a.m. Hemoccult stool  DM2 (diabetes mellitus, type 2) (HCC) Order sliding scale hold metformin check hemoglobin A1c and TSH    Other plan as per orders.  DVT prophylaxis:  SCD      Code Status:    Code Status: Prior FULL CODE as per patient   I had personally discussed CODE STATUS with patient    Family Communication:   Family not at  Bedside    Disposition Plan:     To home once workup is complete and patient is stable   Following barriers for discharge:                            Cp work up is completed                           Will need consultants to evaluate patient prior to discharge                        Would benefit from PT/OT eval prior to DC  Ordered                                    Diabetes care coordinator                                 Wound care  consulted                                   Consults called: cardiology is aware pl let vascular know the pt was admitted in AM   Admission status:  ED Disposition     ED Disposition  Admit   Condition  --   Comment  Hospital Area: [facility] [[id number]]  Level of Care: Telemetry Cardiac [[id number]]  Interfacility transfer: Yes  May place patient in observation at [facility] or [facility] if equivalent level of  care is available:: No  Covid Evaluation: Asymptomatic - no recent exposure (last 10 days) testing not required  Diagnosis: Chest pain [409811]  Admitting Physician: Therisa Doyne [[id number]]  Attending Physician: Therisa Doyne [[id number]]           Obs       Level of care     tele  For   24H      Becket Wecker 02/05/2022, 2:29 AM    [facility]     after 2 AM please page floor coverage PA If 7AM-7PM, please contact the day team taking care of the patient using [email or web address]   Patient was evaluated in the context of the global COVID-19 pandemic,  which necessitated consideration that the patient might be at risk for infection with the SARS-CoV-2 virus that causes COVID-19. Institutional protocols and algorithms that pertain to the evaluation of patients at risk for COVID-19 are in a state of rapid change based on information released by regulatory bodies including the CDC and federal and state organizations. These policies and algorithms were followed during the patient's care.

## 2022-02-05 NOTE — Assessment & Plan Note (Signed)
Status post operative intervention left common femoral, superficial femoral and profundafemoral endarterectomy with vein patch angioplasty; left popliteal, tibioperoneal trunk endarterectomy with vein patch angioplasty; left common femoral to below knee popliteal artery bypass with 6 mm ringed PTFE and harvest of left greater saphenous vein in the mid thigh and below the knee. continue aspirin and Plavix Please notify in a.m. vascular surgery patient's been admitted

## 2022-02-05 NOTE — Assessment & Plan Note (Signed)
Order sliding scale hold metformin check hemoglobin A1c and TSH 

## 2022-02-05 NOTE — Inpatient Diabetes Management (Signed)
Inpatient Diabetes Program Recommendations  AACE/ADA: New Consensus Statement on Inpatient Glycemic Control (2015)  Target Ranges:  Prepandial:   less than 140 mg/dL      Peak postprandial:   less than 180 mg/dL (1-2 hours)      Critically ill patients:  140 - 180 mg/dL   Lab Results  Component Value Date   GLUCAP 132 (H) 02/05/2022   HGBA1C 7.3 (H) 01/02/2022    Spoke with pt and daughter at bedside due to RN request. Per pt report, he was newly diagnosed with diabetes at a wound care center. Pts PCP, Mackie Pai, PA with [facility] ordered metformin 500 mg bid. Pt denies side effects from the metformin. Pt reports that [employer] ordered a glucose meter for him to use outpatient.  Discussed how to check CBGs at home. Discussed A1c level of 7.3%. Discussed glucose and A1c goals. Discussed diet and exercise. Will attach diet information for reference with AVS.  Thanks,  Tama Headings RN, MSN, BC-ADM Inpatient Diabetes Coordinator Team Pager [phone] (8a-5p)

## 2022-02-05 NOTE — Assessment & Plan Note (Addendum)
Continue Imdur at 60 mg p.o. daily and Lopressor at 50 mg p.o. daily

## 2022-02-05 NOTE — Evaluation (Signed)
Physical Therapy Evaluation & Discharge Patient Details Name: [name] MRN: [id number] DOB: 1940-11-30 Today's Date: 02/05/2022  History of Present Illness  Pt is a 81 y.o. male admitted 02/04/22 with chest pain; suspect demand ischemia post-recent vascular sx. Of note, recently s/p L fem-pop artery bypass and endarterectomy 01/27/22. Other PMH includes CAD, DM, HTN, PVD, OA.   Clinical Impression  Patient evaluated by Physical Therapy with no further acute PT needs identified. PTA, pt independent at home since recent LLE revascularization sx, lives with supportive wife. Today, pt independent with mobility and ADL tasks. Pt endorses "slight" L-side chest discomfort with ambulation, "it's not pain... but I know it's there." Educ on activity recommendations while admitted. All education has been completed and the patient has no further questions. Acute PT is signing off. Thank you for this referral.    HR 90s-112 with ambulation (brief episode HR 151 with standing for ~3-sec which resolved to 90s, question reliability of reading)  Post-ambulation BP 155/76 SpO2 95% on RA   Recommendations for follow up therapy are one component of a multi-disciplinary discharge planning process, led by the attending physician.  Recommendations may be updated based on patient status, additional functional criteria and insurance authorization.  Follow Up Recommendations No PT follow up      Assistance Recommended at Discharge PRN  Patient can return home with the following  Assist for transportation    Equipment Recommendations None recommended by PT  Recommendations for Other Services       Functional Status Assessment Patient has not had a recent decline in their functional status     Precautions / Restrictions Precautions Precautions: Other (comment) Precaution Comments: L great toe wound Restrictions Weight Bearing Restrictions: No      Mobility  Bed Mobility Overal bed mobility:  Independent                  Transfers Overall transfer level: Independent Equipment used: None Transfers: Sit to/from Stand                  Ambulation/Gait Ambulation/Gait assistance: Independent Social research officer, government (Feet): 600 Feet Assistive device: None Gait Pattern/deviations: Step-through pattern, Decreased stride length Gait velocity: Decreased     General Gait Details: slow, steady gait independent without DME; HR 90s-112 (brief episode of 151 upon initial standing for ~3-sec which resolved, unsure reliable rhythm), pt endorses "slight" L-side chest discomfort ("I feel it's there")  Stairs            Wheelchair Mobility    Modified Rankin (Stroke Patients Only)       Balance Overall balance assessment: No apparent balance deficits (not formally assessed)                                           Pertinent Vitals/Pain Pain Assessment Pain Assessment: No/denies pain Pain Location: "slight" chest discomfort - but denies pain Pain Descriptors / Indicators: Discomfort Pain Intervention(s): Monitored during session    Home Living Family/patient expects to be discharged to:: Private residence Living Arrangements: Spouse/significant other Available Help at Discharge: Family;Available 24 hours/day Type of Home: House Home Access: Stairs to enter Entrance Stairs-Rails: Chemical engineer of Steps: 4   Home Layout: One level Home Equipment: Conservation officer, nature (2 wheels);BSC/3in1;Grab bars - tub/shower Additional Comments: pt and wife live with dad who has broken hip, DME for him, also has private caregivers.  Wife with flexible job and available to assist as much as needed    Prior Function Prior Level of Function : Independent/Modified Independent;Driving             Mobility Comments: Independent without DME, drives, retired from work       Higher education careers adviser        Extremity/Trunk Assessment   Upper Extremity  Assessment Upper Extremity Assessment: Overall WFL for tasks assessed    Lower Extremity Assessment Lower Extremity Assessment: Overall WFL for tasks assessed (L great toe wound)    Cervical / Trunk Assessment Cervical / Trunk Assessment: Normal  Communication   Communication: No difficulties  Cognition Arousal/Alertness: Awake/alert Behavior During Therapy: WFL for tasks assessed/performed Overall Cognitive Status: Within Functional Limits for tasks assessed                                          General Comments General comments (skin integrity, edema, etc.): [name] present and supportive, able to provide necessary assist during session. post-ambulation BP 155/76 (94), SpO2 95% on RA    Exercises     Assessment/Plan    PT Assessment Patient does not need any further PT services  PT Problem List         PT Treatment Interventions      PT Goals (Current goals can be found in the Care Plan section)  Acute Rehab PT Goals PT Goal Formulation: All assessment and education complete, DC therapy    Frequency       Co-evaluation               AM-PAC PT "6 Clicks" Mobility  Outcome Measure Help needed turning from your back to your side while in a flat bed without using bedrails?: None Help needed moving from lying on your back to sitting on the side of a flat bed without using bedrails?: None Help needed moving to and from a bed to a chair (including a wheelchair)?: None Help needed standing up from a chair using your arms (e.g., wheelchair or bedside chair)?: None Help needed to walk in hospital room?: None Help needed climbing 3-5 steps with a railing? : None 6 Click Score: 24    End of Session   Activity Tolerance: Patient tolerated treatment well Patient left: in bed;with call bell/phone within reach;with family/visitor present Nurse Communication: Mobility status PT Visit Diagnosis: Other abnormalities of gait and mobility  (R26.89)    Time: 5176-1607 PT Time Calculation (min) (ACUTE ONLY): 21 min   Charges:   PT Evaluation $PT Eval Low Complexity: 1 Low        [name], PT, DPT Acute Rehabilitation Services  Personal: Secure Chat Rehab Office: [phone]  [name] 02/05/2022, 9:11 AM

## 2022-02-05 NOTE — Progress Notes (Addendum)
Rounding Note    Patient Name: [name] Date of Encounter: 02/05/2022  [facility] Cardiologist: Nanetta BattyJonathan Berry, MD   Subjective   Patient resting comfortably in bed this morning. He reports his last episode of chest pain was last night around 8:00. Patient confirms previously reported symptom history and endorses chronic/sporadic chest pain that has increased in frequency and severity over the past few days. Pain often occurs while he is laying down and improves with sitting up but also occurs without any specific provocation. He denies changes to exertional tolerance and has not felt short of breath.   Inpatient Medications    Scheduled Meds:  aspirin  81 mg Oral Daily   clopidogrel  75 mg Oral Daily   ezetimibe  10 mg Oral Daily   famotidine  20 mg Oral Daily   insulin aspart  0-9 Units Subcutaneous Q4H   isosorbide mononitrate  60 mg Oral Daily   magnesium oxide  400 mg Oral BID   metoprolol tartrate  50 mg Oral Daily   potassium chloride  40 mEq Oral BID   sodium chloride flush  3 mL Intravenous Q12H   Continuous Infusions:  sodium chloride     PRN Meds: sodium chloride, acetaminophen **OR** acetaminophen, HYDROcodone-acetaminophen, sodium chloride flush   Vital Signs    Vitals:   02/04/22 2200 02/04/22 2230 02/04/22 2304 02/05/22 0352  BP: 117/68 116/65 138/67 127/64  Pulse: 84 85 83 91  Resp: 17 20 (!) 22 17  Temp:  98.2 F (36.8 C) 98.2 F (36.8 C) 98.7 F (37.1 C)  TempSrc:   Oral Oral  SpO2: 96% 96% 96% 95%  Weight:   97.4 kg   Height:   6\' 1"  (1.854 m)     Intake/Output Summary (Last 24 hours) at 02/05/2022 0858 Last data filed at 02/05/2022 0300 Gross per 24 hour  Intake --  Output 250 ml  Net -250 ml      02/04/2022   11:04 PM 02/04/2022    9:45 AM 01/28/2022    9:05 AM  Last 3 Weights  Weight (lbs) 214 lb 11.7 oz 218 lb 218 lb 14.7 oz  Weight (kg) 97.4 kg 98.884 kg 99.3 kg      Telemetry    Sinus rhythm - Personally  Reviewed  ECG    NSR without acute ischemic change - Personally Reviewed  Physical Exam   GEN: No acute distress.   Neck: No JVD Cardiac: RRR, no murmurs, rubs, or gallops.  Respiratory: Clear to auscultation bilaterally. GI: Soft, nontender, non-distended  MS: No edema; No deformity. Neuro:  Nonfocal  Psych: Normal affect   Labs    High Sensitivity Troponin:   Recent Labs  Lab 02/04/22 1457 02/04/22 1708 02/05/22 0125 02/05/22 0211  TROPONINIHS 56* 55* 47* 44*     Chemistry Recent Labs  Lab 02/04/22 1101 02/04/22 1457 02/05/22 0125 02/05/22 0211  NA 141 139  --  140  K 3.6 3.5  --  3.4*  CL 106 108  --  107  CO2 26 24  --  25  GLUCOSE 140* 123*  --  140*  BUN 14 14  --  12  CREATININE 0.72 0.76  --  0.91  CALCIUM 9.3 8.7*  --  8.6*  MG  --   --  1.7  --   PROT 6.4 6.0*  --  5.6*  ALBUMIN 3.6 3.3*  --  3.0*  AST 20 26  --  21  ALT  15 16  --  14  ALKPHOS 60 60  --  46  BILITOT 1.5* 1.4*  --  1.4*  GFRNONAA  --  >60  --  >60  ANIONGAP  --  7  --  8    Lipids  Recent Labs  Lab 02/05/22 0211  CHOL 71  TRIG 83  HDL 34*  LDLCALC 20  CHOLHDL 2.1    Hematology Recent Labs  Lab 02/04/22 1457 02/05/22 0125 02/05/22 0211  WBC 10.6*  --  8.6  RBC 3.24* 3.07* 3.06*  HGB 9.4*  --  9.0*  HCT 28.8*  --  27.4*  MCV 88.9  --  89.5  MCH 29.0  --  29.4  MCHC 32.6  --  32.8  RDW 15.1  --  15.0  PLT 277  --  242   Thyroid  Recent Labs  Lab 02/05/22 0125  TSH 2.213    BNPNo results for input(s): "BNP", "PROBNP" in the last 168 hours.  DDimer No results for input(s): "DDIMER" in the last 168 hours.   Radiology    CT Angio Chest PE W and/or Wo Contrast  Result Date: 02/04/2022 CLINICAL DATA:  Pulmonary embolism (PE) suspected, high prob. 81 y/o male states he went to his PCP this morning and was told he has an elevated troponin level. Pt was originally at PCP to be evaluated for intermittent left sided chest pain that has been going on x 1 yearhat  EXAM: CT ANGIOGRAPHY CHEST WITH CONTRAST TECHNIQUE: Multidetector CT imaging of the chest was performed using the standard protocol during bolus administration of intravenous contrast. Multiplanar CT image reconstructions and MIPs were obtained to evaluate the vascular anatomy. RADIATION DOSE REDUCTION: This exam was performed according to the departmental dose-optimization program which includes automated exposure control, adjustment of the mA and/or kV according to patient size and/or use of iterative reconstruction technique. CONTRAST:  30mL OMNIPAQUE IOHEXOL 350 MG/ML SOLN COMPARISON:  Chest x-ray 02/04/2022 FINDINGS: Cardiovascular: Satisfactory opacification of the pulmonary arteries to the segmental level. No evidence of pulmonary embolism. Limited evaluation of the subsegmental level due to timing of contrast and respiratory motion artifact. Normal heart size. No significant pericardial effusion. The thoracic aorta is normal in caliber. Severe atherosclerotic plaque of the thoracic aorta. Four-vessel coronary artery calcifications status post coronary artery bypass graft. Mediastinum/Nodes: Multiple prominent mediastinal lymph nodes including an enlarged 1.2 cm right paratracheal lymph node (6:103) and posterior mediastinal paraesophageal pericentimeter lymph nodes (4: 49, 51). Enlarged right interlobular lymph node measuring 1.4 cm (6:167). No hilar or axillary lymph nodes. Thyroid gland, trachea, and esophagus demonstrate no significant findings. Tiny hiatal hernia. Lungs/Pleura: Moderate centrilobular emphysematous changes. No focal consolidation. No pulmonary nodule. No pulmonary mass. No pleural effusion. No pneumothorax. Upper Abdomen: No acute abnormality. Punctate left calcification within the kidney that may be vascular. Atherosclerotic plaque. Musculoskeletal: No chest wall abnormality. No suspicious lytic or blastic osseous lesions. No acute displaced fracture. Multilevel degenerative changes of  the spine. Review of the MIP images confirms the above findings. IMPRESSION: 1. No central or segmental pulmonary embolus. Limited evaluation more distally due to artifact and timing of contrast. 2. Indeterminate mediastinal and right interlobular lymphadenopathy. Multiple prominent mediastinal lymph nodes otherwise. Recommend attention on follow-up. 3. Aortic Atherosclerosis (ICD10-I70.0) and Emphysema (ICD10-J43.9). Electronically Signed   By: Tish Frederickson M.D.   On: 02/04/2022 19:52   US Venous Img Lower Unilateral Left  Result Date: 02/04/2022 CLINICAL DATA:  Left lower extremity swelling. EXAM: LEFT LOWER  EXTREMITY VENOUS DOPPLER ULTRASOUND TECHNIQUE: Gray-scale sonography with compression, as well as color and duplex ultrasound, were performed to evaluate the deep venous system(s) from the level of the common femoral vein through the popliteal and proximal calf veins. COMPARISON:  None Available. FINDINGS: VENOUS Normal compressibility of the LEFT common femoral, LEFT superficial femoral, and LEFT popliteal veins. No filling defects to suggest DVT on grayscale or color Doppler imaging. Doppler waveforms show normal direction of venous flow, normal respiratory plasticity and response to augmentation. Limited views of the contralateral common femoral vein are unremarkable. OTHER A 5.4 cm hypoechoic area is seen within the region of the LEFT groin. No abnormal flow is seen within this region on color Doppler evaluation. Edematous soft tissue is seen throughout the LEFT ankle. Limitations: The LEFT great saphenous vein, LEFT posterior tibial vein and left peroneal vein are poorly visualized. IMPRESSION: 1. Limited study, as described above, without evidence of DVT within the LEFT lower extremity. 2. Findings which may represent a LEFT groin hematoma. Correlation with physical examination is recommended. Additional CT evaluation should be considered. 3. LEFT ankle soft tissue edema. Electronically Signed    By: Virgina Norfolk M.D.   On: 02/04/2022 19:27   DG Chest 2 View  Result Date: 02/04/2022 CLINICAL DATA:  Chest pain. EXAM: CHEST - 2 VIEW COMPARISON:  October 21, 2021. FINDINGS: The heart size and mediastinal contours are within normal limits. Both lungs are clear. Status post coronary artery bypass graft. The visualized skeletal structures are unremarkable. IMPRESSION: No active cardiopulmonary disease. Electronically Signed   By: Marijo Conception M.D.   On: 02/04/2022 15:29    Cardiac Studies   06/28/2020 LHC/RHC  Ost LM to Mid LM lesion is 90% stenosed. Ost Cx lesion is 90% stenosed. Prox Cx lesion is 100% stenosed. Prox LAD to Mid LAD lesion is 100% stenosed. Dist LAD lesion is 90% stenosed. The left ventricular systolic function is normal. LV end diastolic pressure is normal. The left ventricular ejection fraction is 55-65% by visual estimate.  07/14/2019 Lexiscan  Nuclear stress EF: 54%. Visually, the EF appears to be greater There was no ST segment deviation noted during stress. This is a low risk study. No evidence of ischemia. No evidence of previous infarction. The study is normal.  Patient Profile     Eliah Marquard is a 81 y.o. male with a pmh sx for CAD status post CABG, hypertension, dyslipidemia, peripheral arterial disease, and recent femoropopliteal bypass and femoral endarterectomy on 01/28/2022 who is being seen 02/05/2022 for the evaluation of chest pain at the request of Dr. Roel Cluck.  Assessment & Plan    CAD s/p CABG Atypical chest pain  Patient with episodic left chest pain occurring both when supine but also at other random times of the day/when not laying down. Chart review shows that this atypical pain has been occurring for over a year and outpatient workup has been negative for a culprit lesion. Frequency/severity reported worse in the past week prompting troponin check by PCP and patient reporting to the ED. Troponin 56, 55, 47, 44. Suspect this leak is  secondary to demand in the setting of recent vascular surgery. ECG shows NSR with no acute ischemic changes.  Continue DAPT with ASA/Plavix Switch Metoprolol to succinate 50mg  Continue Imdur 60mg  daily Repeat echo today Plan for NM stress today If stress test is negative, could consider up titration of antianginal regimen +/- addition of Ranexa.  HTN  Patient with stable BP on home regimen of  Metoprolol, Imdur.  HLD  LDL at goal, 20 on Praluent. Intolerant of statins due to myalgias.       For questions or updates, please contact [facility] Please consult [email or web address] for contact info under        Signed, Perlie Gold, PA-C  02/05/2022, 8:58 AM    I have seen and examined the patient along with Perlie Gold, PA-C .  I have reviewed the chart, notes and new data.  I agree with PA/NP's note.  Key new complaints: currently asymptomatic, but has had two episodes of prolonged angina responsive to NTG, each time during minimal activity. Key examination changes: overweight, normal CV exam Key new findings / data: mild-moderate anemia since recent surgery, Hgb around 9 (baseline 13-14)reviewed preliminary echo - no new wall motion abnormalities and normal EF. Reviewed cardiac cath from 2022. Distal LAD stenosis could be cause of ischemia in setting of anemia. ECG normal. Very mild and adynamic troponin elevation is not clinically relevant.  PLAN: Suspect that his angina threshold is diminished due to anemia and he probably has areas of ischemic myocardium due to branch /distal stenoses. Check perfusion study to make sure there is no large conduit stenosis. If OK will adjust antianginal meds. He is on max dose amlodipine, but BP and HR will allow beta blocker and/or long acting nitrate titration. Already on dual antiplatelet therapy and very effective lipid lowering (LDL 20).  Thurmon Fair, MD, [facility] [phone] 02/05/2022, 10:49 AM

## 2022-02-05 NOTE — Subjective & Objective (Signed)
Has been having intermittent chest pain getting somewhat worse have seen cardiology in the past for the same.  Chest pain responding to nitro. At [facility] noted to have troponin elevated at 63 then 50.  Cardiology was consulted recommending admission for chest pain evaluation. Check pain at this time has been resolved. Recently diagnosed with diabetes Patient with known history of PAD recently undergone femoropopliteal bypass. If left lower extremity edema still present and mild drainage from incision.

## 2022-02-05 NOTE — Progress Notes (Signed)
  Echocardiogram 2D Echocardiogram has been performed.  [name] 02/05/2022, 8:17 AM

## 2022-02-05 NOTE — Progress Notes (Signed)
[facility] PROGRESS NOTE    Progress Note  [name]  TDV:[id number] DOB: 30-Jun-1940 DOA: 02/04/2022 PCP: [name], PA-C     Brief Narrative:   [name] is an 81 y.o. 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 Comes in with atypical chest pain, cardiac biomarkers have basically remained flat, question demand ischemia. He was continue on aspirin and Plavix, and Zetia due to statin intolerance. Continue beta-blocker and Imdur. Cardiology was consulted recommended a stress test. 2D echo pending.  Essential hypertension: Continue Lopressor and Imdur, blood pressure is relatively well controlled continue current regimen.  Hypokalemia: Replete orally, recheck in the morning, magnesium level 1.7.  Dyslipidemia: Continue Setia statin intolerance.  Peripheral vascular disease with a recent femoropopliteal bypass and femoral endarterectomy on 01/28/2022: Continue aspirin and Plavix. Currently asymptomatic.  Normocytic anemia: Hemoccult is pending, his hemoglobin has been relatively stable.  Leukocytosis: On admission white count of 10.6 repeated this morning is 8.6, has remained afebrile continue to hold antibiotics. Leukocytosis is resolved.  Diabetes mellitus type 2 with a last hemoglobin A1c of 7.3. Hold oral  hypoglycemic agents continue sliding scale insulin, patient is currently n.p.o., blood sugars relatively stable.    DVT prophylaxis: lovenox Family Communication:none Status is: Observation The patient remains OBS appropriate and will d/c before 2 midnights.    Code Status:     Code Status Orders  (From admission, onward)           Start     Ordered   02/05/22 0016  Full code  Continuous        02/05/22 0017           Code Status History     Date Active Date Inactive Code Status Order ID Comments User Context   01/27/2022 1639 01/30/2022 1654 Full Code [id number]  Maeola Harman, MD Inpatient   12/06/2020 1625 12/07/2020 1628 Full Code [id number]  [name], MD Inpatient   06/28/2020 1322 06/28/2020 2011 Full Code [id number]  [name], MD Inpatient   04/13/2017 1358 04/14/2017 1258 Full Code [id number]  [name], MD Inpatient   08/04/2016 0953 08/04/2016 1645 Full Code [id number]  [name], MD Inpatient      Advance Directive Documentation    Flowsheet Row Most Recent Value  Type of Advance Directive Living will  Pre-existing out of facility DNR order (yellow form or pink MOST form) --  "MOST" Form in Place? --         IV Access:   Peripheral IV   Procedures and diagnostic studies:   CT Angio Chest PE W and/or Wo Contrast  Result Date: 02/04/2022 CLINICAL DATA:  Pulmonary embolism (PE) suspected, high prob. 81 y/o male states he went to his PCP this morning and was told he has an elevated troponin level. Pt was originally at PCP to be evaluated for intermittent left  sided chest pain that has been going on x 1 yearhat EXAM: CT ANGIOGRAPHY CHEST WITH CONTRAST TECHNIQUE: Multidetector CT imaging of the chest was performed using the standard protocol during bolus administration of intravenous contrast. Multiplanar CT image reconstructions and MIPs were obtained to evaluate the vascular anatomy. RADIATION DOSE REDUCTION: This  exam was performed according to the departmental dose-optimization program which includes automated exposure control, adjustment of the mA and/or kV according to patient size and/or use of iterative reconstruction technique. CONTRAST:  75mL OMNIPAQUE IOHEXOL 350 MG/ML SOLN COMPARISON:  Chest x-ray 02/04/2022 FINDINGS: Cardiovascular: Satisfactory opacification of the pulmonary arteries to the segmental level. No evidence of pulmonary embolism. Limited evaluation of the subsegmental level due to timing of contrast and respiratory motion artifact. Normal heart size. No significant pericardial effusion. The thoracic aorta is normal in caliber. Severe atherosclerotic plaque of the thoracic aorta. Four-vessel coronary artery calcifications status post coronary artery bypass graft. Mediastinum/Nodes: Multiple prominent mediastinal lymph nodes including an enlarged 1.2 cm right paratracheal lymph node (6:103) and posterior mediastinal paraesophageal pericentimeter lymph nodes (4: 49, 51). Enlarged right interlobular lymph node measuring 1.4 cm (6:167). No hilar or axillary lymph nodes. Thyroid gland, trachea, and esophagus demonstrate no significant findings. Tiny hiatal hernia. Lungs/Pleura: Moderate centrilobular emphysematous changes. No focal consolidation. No pulmonary nodule. No pulmonary mass. No pleural effusion. No pneumothorax. Upper Abdomen: No acute abnormality. Punctate left calcification within the kidney that may be vascular. Atherosclerotic plaque. Musculoskeletal: No chest wall abnormality. No suspicious lytic or blastic osseous lesions. No acute displaced fracture. Multilevel degenerative changes of the spine. Review of the MIP images confirms the above findings. IMPRESSION: 1. No central or segmental pulmonary embolus. Limited evaluation more distally due to artifact and timing of contrast. 2. Indeterminate mediastinal and right interlobular lymphadenopathy. Multiple prominent mediastinal lymph nodes  otherwise. Recommend attention on follow-up. 3. Aortic Atherosclerosis (ICD10-I70.0) and Emphysema (ICD10-J43.9). Electronically Signed   By: Tish FredericksonMorgane  Naveau M.D.   On: 02/04/2022 19:52   US Venous Img Lower Unilateral Left  Result Date: 02/04/2022 CLINICAL DATA:  Left lower extremity swelling. EXAM: LEFT LOWER EXTREMITY VENOUS DOPPLER ULTRASOUND TECHNIQUE: Gray-scale sonography with compression, as well as color and duplex ultrasound, were performed to evaluate the deep venous system(s) from the level of the common femoral vein through the popliteal and proximal calf veins. COMPARISON:  None Available. FINDINGS: VENOUS Normal compressibility of the LEFT common femoral, LEFT superficial femoral, and LEFT popliteal veins. No filling defects to suggest DVT on grayscale or color Doppler imaging. Doppler waveforms show normal direction of venous flow, normal respiratory plasticity and response to augmentation. Limited views of the contralateral common femoral vein are unremarkable. OTHER A 5.4 cm hypoechoic area is seen within the region of the LEFT groin. No abnormal flow is seen within this region on color Doppler evaluation. Edematous soft tissue is seen throughout the LEFT ankle. Limitations: The LEFT great saphenous vein, LEFT posterior tibial vein and left peroneal vein are poorly visualized. IMPRESSION: 1. Limited study, as described above, without evidence of DVT within the LEFT lower extremity. 2. Findings which may represent a LEFT groin hematoma. Correlation with physical examination is recommended. Additional CT evaluation should be considered. 3. LEFT ankle soft tissue edema. Electronically Signed   By: Aram Candelahaddeus  Houston M.D.   On: 02/04/2022 19:27   DG Chest 2 View  Result Date: 02/04/2022 CLINICAL DATA:  Chest pain. EXAM: CHEST - 2 VIEW COMPARISON:  October 21, 2021. FINDINGS: The heart size and mediastinal contours are within  normal limits. Both lungs are clear. Status post coronary artery bypass  graft. The visualized skeletal structures are unremarkable. IMPRESSION: No active cardiopulmonary disease. Electronically Signed   By: Marijo Conception M.D.   On: 02/04/2022 15:29     Medical Consultants:   None.   Subjective:    [name] no chest pain or shortness of breath this morning.  Objective:    Vitals:   02/04/22 2200 02/04/22 2230 02/04/22 2304 02/05/22 0352  BP: 117/68 116/65 138/67 127/64  Pulse: 84 85 83 91  Resp: 17 20 (!) 22 17  Temp:  98.2 F (36.8 C) 98.2 F (36.8 C) 98.7 F (37.1 C)  TempSrc:   Oral Oral  SpO2: 96% 96% 96% 95%  Weight:   97.4 kg   Height:   6\' 1"  (1.854 m)    SpO2: 95 %   Intake/Output Summary (Last 24 hours) at 02/05/2022 0647 Last data filed at 02/05/2022 0300 Gross per 24 hour  Intake --  Output 250 ml  Net -250 ml   Filed Weights   02/04/22 2304  Weight: 97.4 kg    Exam: General exam: In no acute distress. Respiratory system: Good air movement and clear to auscultation. Cardiovascular system: S1 & S2 heard, RRR. No JVD. Extremities: No pedal edema. Skin: No rashes, lesions or ulcers Psychiatry: Judgement and insight appear normal. Mood & affect appropriate.    Data Reviewed:    Labs: Basic Metabolic Panel: Recent Labs  Lab 02/04/22 1101 02/04/22 1457 02/05/22 0125 02/05/22 0211  NA 141 139  --  140  K 3.6 3.5  --  3.4*  CL 106 108  --  107  CO2 26 24  --  25  GLUCOSE 140* 123*  --  140*  BUN 14 14  --  12  CREATININE 0.72 0.76  --  0.91  CALCIUM 9.3 8.7*  --  8.6*  MG  --   --  1.7  --   PHOS  --   --  3.9  --    GFR Estimated Creatinine Clearance: 80.9 mL/min (by C-G formula based on SCr of 0.91 mg/dL). Liver Function Tests: Recent Labs  Lab 02/04/22 1101 02/04/22 1457 02/05/22 0211  AST 20 26 21   ALT 15 16 14   ALKPHOS 60 60 46  BILITOT 1.5* 1.4* 1.4*  PROT 6.4 6.0* 5.6*  ALBUMIN 3.6 3.3* 3.0*   Recent Labs  Lab 02/04/22 1457  LIPASE 40   No results for input(s): "AMMONIA" in the  last 168 hours. Coagulation profile Recent Labs  Lab 02/05/22 0125  INR 1.1   COVID-19 Labs  Recent Labs    02/05/22 0125  FERRITIN 76    Lab Results  Component Value Date   SARSCOV2NAA NEGATIVE 06/26/2020    CBC: Recent Labs  Lab 02/04/22 1457 02/05/22 0211  WBC 10.6* 8.6  HGB 9.4* 9.0*  HCT 28.8* 27.4*  MCV 88.9 89.5  PLT 277 242   Cardiac Enzymes: No results for input(s): "CKTOTAL", "CKMB", "CKMBINDEX", "TROPONINI" in the last 168 hours. BNP (last 3 results) No results for input(s): "PROBNP" in the last 8760 hours. CBG: Recent Labs  Lab 02/04/22 2317 02/05/22 0609  GLUCAP 151* 139*   D-Dimer: No results for input(s): "DDIMER" in the last 72 hours. Hgb A1c: No results for input(s): "HGBA1C" in the last 72 hours. Lipid Profile: Recent Labs    02/05/22 0211  CHOL 71  HDL 34*  LDLCALC 20  TRIG 83  CHOLHDL 2.1  Thyroid function studies: Recent Labs    02/05/22 0125  TSH 2.213   Anemia work up: Recent Labs    02/05/22 0125  VITAMINB12 192  FOLATE 8.0  FERRITIN 76  TIBC 291  IRON 33*  RETICCTPCT 3.3*   Sepsis Labs: Recent Labs  Lab 02/04/22 1457 02/05/22 0211  WBC 10.6* 8.6   Microbiology Recent Results (from the past 240 hour(s))  Surgical pcr screen     Status: None   Collection Time: 01/28/22  9:26 AM   Specimen: Nasal Mucosa; Nasal Swab  Result Value Ref Range Status   MRSA, PCR NEGATIVE NEGATIVE Final   Staphylococcus aureus NEGATIVE NEGATIVE Final    Comment: (NOTE) The Xpert SA Assay (FDA approved for NASAL specimens in patients 25 years of age and older), is one component of a comprehensive surveillance program. It is not intended to diagnose infection nor to guide or monitor treatment. Performed at [facility], [address], [place], [place] [address]      Medications:    aspirin  81 mg Oral Daily   clopidogrel  75 mg Oral Daily   ezetimibe  10 mg Oral Daily   famotidine  20 mg Oral Daily   insulin  aspart  0-9 Units Subcutaneous Q4H   isosorbide mononitrate  60 mg Oral Daily   metoprolol tartrate  50 mg Oral Daily   sodium chloride flush  3 mL Intravenous Q12H   Continuous Infusions:  sodium chloride        LOS: 0 days   Marinda Elk  [facility]  02/05/2022, 6:47 AM

## 2022-02-05 NOTE — Progress Notes (Signed)
   [name] presented for a lexiscan cardiolite today.  No immediate complications.  Stress imaging is pending at this time.  Reino Bellis, NP 02/05/2022, 1:41 PM

## 2022-02-05 NOTE — Progress Notes (Signed)
Reviewed the patient's nuclear study, intermediate risk perfusion pattern with apparent ischemia in a relatively small patch of the basal anterior wall and anterior septum (suspect this is due to left main coronary artery stenosis feeding a relatively short segment of the LAD artery with attached septals and diagonals, with downstream occlusion of the mid LAD artery, so there is no perfusion via the LIMA bypass). The overall area of myocardium in jeopardy appears relatively small.  Not convinced it is worth performing revascularization of the left main to LAD.  Hopefully his angina will subside once his anemia resolves.  In the meantime we will increase the dose of beta-blocker, continue long-acting nitrates and amlodipine.  Option to start Ranexa if this is insufficient.

## 2022-02-05 NOTE — Evaluation (Signed)
SLP Cancellation Note  Patient Details Name: [name] MRN: [id number] DOB: [birth year]   Cancelled treatment:       Reason Eval/Treat Not Completed: Other (comment);Patient at procedure or test/unavailable (pt npo for procedure today, will continue efforts)  Richard Lime, [name] [place] [facility] [phone] Pager [phone]   [name] 02/05/2022, 7:59 AM

## 2022-02-05 NOTE — Progress Notes (Signed)
OT Cancellation Note  Patient Details Name: [name] MRN: [id number] DOB: 1941-01-26   Cancelled Treatment:    Reason Eval/Treat Not Completed: OT screened, no needs identified, will sign off (discussed with PT, pt at baseline and has no acute OT needs at this time, will s/o. Please reconsult if there is a change in pt status.)  Lynnda Child, OTD, OTR/L Acute Rehab [phone] - [place] 02/05/2022, 9:17 AM

## 2022-02-05 NOTE — Consult Note (Signed)
WOC Nurse Consult Note: Reason for Consult:left foot great toe wound, full thickness, left foot, 2nd toe wound, left groin wound with weeping at intervals. All wounds are improving, vascular following, patient seen in outpatient wound care center (had an appointment for today) but will cancel due to admission Wound type: full thickness Pressure Injury POA: Yes/No/NA Measurement: Left groin wound is approximated with Dermabond and weeping from distal end. This is improving according to patient. Wound care will continue with dry dressings changed BID. Left LE wound: is closely approximated with Dermabond. No drainage. Left great toe: 4cm x 1.2cm x 0.2cm with 90% red, 10% movable fibrinous yellow tissue. We will continue with xeroform dressings changed daily. Left foot, 2nd digit: 1cm round dry eschar. We will paint daily with povidone-iodine swabstick to keep area dry and infection free. Wound bed:As noted above Drainage (amount, consistency, odor) As noted above Periwound: intact, dry Dressing procedure/placement/frequency: Wound care as noted above. A sacral dressing is to be placed for PI prevention. Float heels.  [place] nursing team will not follow, but will remain available to this patient, the nursing and medical teams.  Please re-consult if needed.  Thank you for inviting [place] to participate in this patient's Plan of Care.  Maudie Flakes, MSN, RN, CNS, [place], Serita Grammes, [employer], [employer] phone:  [phone]

## 2022-02-05 NOTE — Assessment & Plan Note (Signed)
Check lipid panel  

## 2022-02-05 NOTE — Plan of Care (Signed)
  Problem: Education: Goal: Understanding of CV disease, CV risk reduction, and recovery process will improve Outcome: Progressing Goal: Individualized Educational Video(s) Outcome: Progressing   Problem: Activity: Goal: Ability to return to baseline activity level will improve Outcome: Progressing   Problem: Cardiovascular: Goal: Ability to achieve and maintain adequate cardiovascular perfusion will improve Outcome: Progressing Goal: Vascular access site(s) Level 0-1 will be maintained Outcome: Progressing   Problem: Health Behavior/Discharge Planning: Goal: Ability to safely manage health-related needs after discharge will improve Outcome: Progressing   Problem: Education: Goal: Knowledge of prescribed regimen will improve Outcome: Progressing   Problem: Activity: Goal: Ability to tolerate increased activity will improve Outcome: Progressing   Problem: Bowel/Gastric: Goal: Gastrointestinal status for postoperative course will improve Outcome: Progressing   Problem: Clinical Measurements: Goal: Postoperative complications will be avoided or minimized Outcome: Progressing Goal: Signs and symptoms of graft occlusion will improve Outcome: Progressing   Problem: Skin Integrity: Goal: Demonstration of wound healing without infection will improve Outcome: Progressing   Problem: Education: Goal: Knowledge of General Education information will improve Description: Including pain rating scale, medication(s)/side effects and non-pharmacologic comfort measures Outcome: Progressing   Problem: Health Behavior/Discharge Planning: Goal: Ability to manage health-related needs will improve Outcome: Progressing   Problem: Clinical Measurements: Goal: Ability to maintain clinical measurements within normal limits will improve Outcome: Progressing Goal: Will remain free from infection Outcome: Progressing Goal: Diagnostic test results will improve Outcome: Progressing Goal:  Respiratory complications will improve Outcome: Progressing Goal: Cardiovascular complication will be avoided Outcome: Progressing   Problem: Activity: Goal: Risk for activity intolerance will decrease Outcome: Progressing   Problem: Nutrition: Goal: Adequate nutrition will be maintained Outcome: Progressing   Problem: Coping: Goal: Level of anxiety will decrease Outcome: Progressing   Problem: Elimination: Goal: Will not experience complications related to bowel motility Outcome: Progressing Goal: Will not experience complications related to urinary retention Outcome: Progressing   Problem: Pain Managment: Goal: General experience of comfort will improve Outcome: Progressing   Problem: Safety: Goal: Ability to remain free from injury will improve Outcome: Progressing   Problem: Skin Integrity: Goal: Risk for impaired skin integrity will decrease Outcome: Progressing   Problem: Education: Goal: Ability to describe self-care measures that may prevent or decrease complications (Diabetes Survival Skills Education) will improve Outcome: Progressing Goal: Individualized Educational Video(s) Outcome: Progressing   Problem: Coping: Goal: Ability to adjust to condition or change in health will improve Outcome: Progressing   Problem: Fluid Volume: Goal: Ability to maintain a balanced intake and output will improve Outcome: Progressing   Problem: Health Behavior/Discharge Planning: Goal: Ability to identify and utilize available resources and services will improve Outcome: Progressing Goal: Ability to manage health-related needs will improve Outcome: Progressing   Problem: Metabolic: Goal: Ability to maintain appropriate glucose levels will improve Outcome: Progressing   Problem: Nutritional: Goal: Maintenance of adequate nutrition will improve Outcome: Progressing Goal: Progress toward achieving an optimal weight will improve Outcome: Progressing

## 2022-02-05 NOTE — Assessment & Plan Note (Signed)
Cardiology consult May need definitive treatment and/or repeat investigation.  Will make n.p.o. postmidnight continue to follow troponin currently appears to be stable echogram ordered

## 2022-02-05 NOTE — Inpatient Diabetes Management (Signed)
Inpatient Diabetes Program Recommendations  AACE/ADA: New Consensus Statement on Inpatient Glycemic Control   Target Ranges:  Prepandial:   less than 140 mg/dL      Peak postprandial:   less than 180 mg/dL (1-2 hours)      Critically ill patients:  140 - 180 mg/dL    Latest Reference Range & Units 02/04/22 23:17 02/05/22 06:09 02/05/22 11:31  Glucose-Capillary 70 - 99 mg/dL 151 (H) 139 (H) 125 (H)    Latest Reference Range & Units 01/02/22 09:12  Hemoglobin A1C 4.6 - 6.5 % 7.3 (H)   Review of Glycemic Control  Diabetes history: DM2 Outpatient Diabetes medications: Metformin 500 mg BID Current orders for Inpatient glycemic control: Novolog 0-9 units Q4H  Inpatient Diabetes Program Recommendations:    Insulin: Agree with insulin orders.  HbgA1C: A1C 7.3% on 01/02/22 indicating an average glucose of 163 mg/dl over the prior 2-3 months.  NOTE: Noted consult for diabetes coordinator. Chart reviewed. Patient has DM2 hx and takes Metformin 500 mg BID. Patient admitted with atypical chest pain. A1C 7.3% on 01/02/22 indicating fair DM control with average glucose of 163 mg/dl. Patient is ordered sensitive correction Q4H and CBGs have ranged from 125-151 mg/dl since admitted. Agree with current insulin orders.  Thanks, Barnie Alderman, RN, MSN, [place] Diabetes Coordinator Inpatient Diabetes Program [phone] (Team Pager from 8am to [place])

## 2022-02-05 NOTE — Consult Note (Signed)
Cardiology Consultation   Patient ID: [name] MRN: [id number]; DOB: November 30, 1940  Admit date: 02/04/2022 Date of Consult: 02/05/2022  PCP:  Marisue Brooklyn   [facility] Providers Cardiologist:  Nanetta Batty, MD        Patient Profile:   [name] is a 81 y.o. male with a pmh sx for CAD status post CABG, hypertension, dyslipidemia, peripheral arterial disease, and recent femoropopliteal bypass and femoral endarterectomy on 01/28/2022 who is being seen 02/05/2022 for the evaluation of chest pain at the request of Dr. Adela Glimpse  History of Present Illness:   [name] is a 81 y.o. male with a pmh sx for CAD status post CABG, hypertension, dyslipidemia, peripheral arterial disease, and recent femoropopliteal bypass and femoral endarterectomy on 01/28/2022 who is being seen 02/05/2022 for the evaluation of chest pain at the request of Dr. Adela Glimpse. He had a vascular surgery 7 days ago- now having on and off chest discomfort. Went to the PCP office where elevated troponin were found.  Patient reports that for several months he has been having on and off chest discomfort in his left central chest that is pressure-like and is intermittent. The pain goes away in 10-15 mins. Does not persist. No radiation. No SOB or N/V.  He was having the symptoms of chest discomfort long before having his recent surgery.Troponin at the OSH were 60s and non trending. Due to his history of CAD with CABG, the intermittent left chest discomfort was better with nitro, and this positive troponin, cardiology was consulted. He was admitted to the hospitalist service. Currently he is not having any chest pain. 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 [name] December 27, 2004, for left main 3-vessel disease. He had a LIMA to his LAD, a vein to his circumflex marginal branch as well as to the PDA.  He did have a negative Myoview 07/14/2019.  His Imdur was increased but did not have  an affect on CP. 06/28/2020 revealing a left dominant system, normal LV function, patent vein to the distal left PDA, LM branch and LIMA to the LAD.  He did have a 90% apical LAD stenosis after LIMA insertion.   Past Medical History:  Diagnosis Date   Claudication (HCC) 06/27/2011   Left ABIs abnormal values with mild-moderate arterial insufficiency, right SFA moderate amount of mixed density plaque elevating velocities suggestive 50-69% diameter reduction   Coronary artery disease 06/27/2011   Normal Lexiscan, EF 64, post-stress EF 64, no EKG changes   Diabetes mellitus without complication (HCC)    prediabetic   Hyperlipidemia    Hypertension    Impotence of organic origin    Osteoarthritis    Other testicular hypofunction    PVD (peripheral vascular disease) (HCC)     Past Surgical History:  Procedure Laterality Date   ABDOMINAL AORTOGRAM N/A 06/28/2020   Procedure: ABDOMINAL AORTOGRAM;  Surgeon: [name], MD;  Location: [facility] INVASIVE CV LAB;  Service: Cardiovascular;  Laterality: N/A;   ABDOMINAL AORTOGRAM W/LOWER EXTREMITY Left 01/27/2022   Procedure: ABDOMINAL AORTOGRAM W/LOWER EXTREMITY;  Surgeon: Maeola Harman, MD;  Location: [facility] INVASIVE CV LAB;  Service: Cardiovascular;  Laterality: Left;   CARDIAC CATHETERIZATION  12/26/2004   Left main-90% distal tapered stenosis, left circumflex-95% ostial stenosis, first marginal branch 99% ostial branch, posterior descending artery 99% ostial stenosis, 60-70% segmental mid stenosis, needed CABG   CORONARY ARTERY BYPASS GRAFT  12/27/2004   Left main 3-vessel  disease, LIMA to LAD, saphenous vein to circumflexmarginal branch and to the PDA   ENDARTERECTOMY FEMORAL Left 01/28/2022   Procedure: LEFT ENDARTERECTOMYCOMMON  [facility] BELOW KNEE POPLITEAL;  Surgeon: Maeola Harman, MD;  Location: [facility] OR;  Service: Vascular;  Laterality: Left;   FEMORAL-POPLITEAL BYPASS GRAFT Left 01/28/2022   Procedure: LEFT FEMORAL-POPLITEAL  ARTERY BYPASS;  Surgeon: Maeola Harman, MD;  Location: [facility] OR;  Service: Vascular;  Laterality: Left;   INGUINAL HERNIA REPAIR Left 2005   LOWER EXTREMITY ANGIOGRAPHY N/A 04/13/2017   Procedure: LOWER EXTREMITY ANGIOGRAPHY;  Surgeon: [name], MD;  Location: [facility] INVASIVE CV LAB;  Service: Cardiovascular;  Laterality: N/A;   LOWER EXTREMITY ANGIOGRAPHY Bilateral 12/06/2020   Procedure: Lower Extremity Angiography;  Surgeon: [name], MD;  Location: [facility] INVASIVE CV LAB;  Service: Cardiovascular;  Laterality: Bilateral;  Limited Study   LOWER EXTREMITY INTERVENTION N/A 08/04/2016   Procedure: Lower Extremity Intervention;  Surgeon: [name], MD;  Location: [facility] INVASIVE CV LAB;  Service: Cardiovascular;  Laterality: N/A;   PERIPHERAL VASCULAR ATHERECTOMY Left 04/13/2017   Procedure: PERIPHERAL VASCULAR ATHERECTOMY;  Surgeon: [name], MD;  Location: [facility] INVASIVE CV LAB;  Service: Cardiovascular;  Laterality: Left;  SFA   PERIPHERAL VASCULAR ATHERECTOMY  12/06/2020   Procedure: PERIPHERAL VASCULAR ATHERECTOMY;  Surgeon: [name], MD;  Location: [facility] INVASIVE CV LAB;  Service: Cardiovascular;;  Lt Iliac   PERIPHERAL VASCULAR BALLOON ANGIOPLASTY Left 04/13/2017   Procedure: PERIPHERAL VASCULAR BALLOON ANGIOPLASTY;  Surgeon: [name], MD;  Location: [facility] INVASIVE CV LAB;  Service: Cardiovascular;  Laterality: Left;  SFA   PERIPHERAL VASCULAR INTERVENTION  12/06/2020   Procedure: PERIPHERAL VASCULAR INTERVENTION;  Surgeon: [name], MD;  Location: [facility] INVASIVE CV LAB;  Service: Cardiovascular;;  Lt Iliac   RIGHT/LEFT HEART CATH AND CORONARY/GRAFT ANGIOGRAPHY N/A 06/28/2020   Procedure: RIGHT/LEFT HEART CATH AND CORONARY/GRAFT ANGIOGRAPHY;  Surgeon: [name], MD;  Location: [facility] INVASIVE CV LAB;  Service: Cardiovascular;  Laterality: N/A;       Inpatient Medications: Scheduled Meds:  aspirin  81 mg Oral Daily   clopidogrel  75 mg Oral  Daily   famotidine  20 mg Oral Daily   insulin aspart  0-9 Units Subcutaneous Q4H   isosorbide mononitrate  60 mg Oral Daily   metoprolol tartrate  50 mg Oral Daily   sodium chloride flush  3 mL Intravenous Q12H   Continuous Infusions:  sodium chloride     PRN Meds: sodium chloride, acetaminophen **OR** acetaminophen, HYDROcodone-acetaminophen, sodium chloride flush  Allergies:    Allergies  Allergen Reactions   Crestor [Rosuvastatin] Other (See Comments)    Myalgias   Lipitor [Atorvastatin]     myalgias   Lisinopril     cough    Social History:   Social History   Socioeconomic History   Marital status: Married    Spouse name: cathy   Number of children: 1   Years of education: 12   Highest education level: Not on file  Occupational History   Occupation: retired  Tobacco Use   Smoking status: Former    Packs/day: 1.00    Years: 50.00    Total pack years: 50.00    Types: Cigarettes    Quit date: 12/17/2004    Years since quitting: 17.1   Smokeless tobacco: Never  Vaping Use   Vaping Use: Never used  Substance and Sexual Activity   Alcohol use: Yes    Comment: occasionally  Drug use: No   Sexual activity: Not on file  Other Topics Concern   Not on file  Social History Narrative   Lives with wife   Caffeine - coffee, 1 cup daily   Social Determinants of Health   Financial Resource Strain: Not on file  Food Insecurity: No Food Insecurity (01/28/2022)   Hunger Vital Sign    Worried About Running Out of Food in the Last Year: Never true    Ran Out of Food in the Last Year: Never true  Transportation Needs: No Transportation Needs (01/28/2022)   PRAPARE - Administrator, Civil ServiceTransportation    Lack of Transportation (Medical): No    Lack of Transportation (Non-Medical): No  Physical Activity: Not on file  Stress: Not on file  Social Connections: Not on file  Intimate Partner Violence: Not At Risk (01/28/2022)   Humiliation, Afraid, Rape, and Kick questionnaire    Fear of  Current or Ex-Partner: No    Emotionally Abused: No    Physically Abused: No    Sexually Abused: No    Family History:    Family History  Problem Relation Age of Onset   Hypertension Mother        [id number]   Clotting disorder Father        [id number]   Diabetes Brother        heart surgery     ROS:  Please see the history of present illness.  All other ROS reviewed and negative.     Physical Exam/Data:   Vitals:   02/04/22 2100 02/04/22 2200 02/04/22 2230 02/04/22 2304  BP: 133/61 117/68 116/65 138/67  Pulse: 88 84 85 83  Resp: (!) 24 17 20  (!) 22  Temp:   98.2 F (36.8 C) 98.2 F (36.8 C)  TempSrc:    Oral  SpO2: 97% 96% 96% 96%  Weight:    97.4 kg  Height:    6\' 1"  (1.854 m)   No intake or output data in the 24 hours ending 02/05/22 0127    02/04/2022   11:04 PM 02/04/2022    9:45 AM 01/28/2022    9:05 AM  Last 3 Weights  Weight (lbs) 214 lb 11.7 oz 218 lb 218 lb 14.7 oz  Weight (kg) 97.4 kg 98.884 kg 99.3 kg     Body mass index is 28.33 kg/m.  General:  Well nourished, well developed, in no acute distress HEENT: normal Neck: no JVD Vascular: No carotid bruits; Distal pulses 2+ bilaterally Cardiac:  normal S1, S2; RRR; no murmur  Lungs:  clear to auscultation bilaterally, no wheezing, rhonchi or rales  Abd: soft, nontender, no hepatomegaly  Ext: no edema Musculoskeletal:  No deformities, BUE and BLE strength normal and equal Skin: warm and dry  Neuro:  CNs 2-12 intact, no focal abnormalities noted Psych:  Normal affect   EKG:  The EKG was personally reviewed and demonstrates:  no STEMI  Relevant CV Studies:  He has a history of CAD status post coronary artery bypass grafting by Dr. Ashley Marinerub Owen December 27, 2004, for left main 3-vessel disease. He had a LIMA to his LAD, a vein to his circumflex marginal branch as well as to the PDA.  He did have a negative Myoview 07/14/2019.  His Imdur was increased but did not have an affect on CP. 06/28/2020 revealing a left dominant  system, normal LV function, patent vein to the distal left PDA, LM branch and LIMA to the LAD.  He did have a 90% apical  LAD stenosis after LIMA insertion.  Laboratory Data:  High Sensitivity Troponin:   Recent Labs  Lab 02/04/22 1457 02/04/22 1708  TROPONINIHS 56* 55*     Chemistry Recent Labs  Lab 01/29/22 0412 02/04/22 1101 02/04/22 1457  NA 139 141 139  K 3.7 3.6 3.5  CL 107 106 108  CO2 24 26 24   GLUCOSE 154* 140* 123*  BUN 9 14 14   CREATININE 0.65 0.72 0.76  CALCIUM 7.7* 9.3 8.7*  GFRNONAA >60  --  >60  ANIONGAP 8  --  7    Recent Labs  Lab 02/04/22 1101 02/04/22 1457  PROT 6.4 6.0*  ALBUMIN 3.6 3.3*  AST 20 26  ALT 15 16  ALKPHOS 60 60  BILITOT 1.5* 1.4*   Lipids No results for input(s): "CHOL", "TRIG", "HDL", "LABVLDL", "LDLCALC", "CHOLHDL" in the last 168 hours.  Hematology Recent Labs  Lab 01/29/22 0412 02/04/22 1457  WBC 9.4 10.6*  RBC 3.14* 3.24*  HGB 9.2* 9.4*  HCT 27.7* 28.8*  MCV 88.2 88.9  MCH 29.3 29.0  MCHC 33.2 32.6  RDW 13.8 15.1  PLT 198 277   Thyroid No results for input(s): "TSH", "FREET4" in the last 168 hours.  BNPNo results for input(s): "BNP", "PROBNP" in the last 168 hours.  DDimer No results for input(s): "DDIMER" in the last 168 hours.   Radiology/Studies:  CT Angio Chest PE W and/or Wo Contrast  Result Date: 02/04/2022 CLINICAL DATA:  Pulmonary embolism (PE) suspected, high prob. 81 y/o male states he went to his PCP this morning and was told he has an elevated troponin level. Pt was originally at PCP to be evaluated for intermittent left sided chest pain that has been going on x 1 yearhat EXAM: CT ANGIOGRAPHY CHEST WITH CONTRAST TECHNIQUE: Multidetector CT imaging of the chest was performed using the standard protocol during bolus administration of intravenous contrast. Multiplanar CT image reconstructions and MIPs were obtained to evaluate the vascular anatomy. RADIATION DOSE REDUCTION: This exam was performed according  to the departmental dose-optimization program which includes automated exposure control, adjustment of the mA and/or kV according to patient size and/or use of iterative reconstruction technique. CONTRAST:  2mL OMNIPAQUE IOHEXOL 350 MG/ML SOLN COMPARISON:  Chest x-ray 02/04/2022 FINDINGS: Cardiovascular: Satisfactory opacification of the pulmonary arteries to the segmental level. No evidence of pulmonary embolism. Limited evaluation of the subsegmental level due to timing of contrast and respiratory motion artifact. Normal heart size. No significant pericardial effusion. The thoracic aorta is normal in caliber. Severe atherosclerotic plaque of the thoracic aorta. Four-vessel coronary artery calcifications status post coronary artery bypass graft. Mediastinum/Nodes: Multiple prominent mediastinal lymph nodes including an enlarged 1.2 cm right paratracheal lymph node (6:103) and posterior mediastinal paraesophageal pericentimeter lymph nodes (4: 49, 51). Enlarged right interlobular lymph node measuring 1.4 cm (6:167). No hilar or axillary lymph nodes. Thyroid gland, trachea, and esophagus demonstrate no significant findings. Tiny hiatal hernia. Lungs/Pleura: Moderate centrilobular emphysematous changes. No focal consolidation. No pulmonary nodule. No pulmonary mass. No pleural effusion. No pneumothorax. Upper Abdomen: No acute abnormality. Punctate left calcification within the kidney that may be vascular. Atherosclerotic plaque. Musculoskeletal: No chest wall abnormality. No suspicious lytic or blastic osseous lesions. No acute displaced fracture. Multilevel degenerative changes of the spine. Review of the MIP images confirms the above findings. IMPRESSION: 1. No central or segmental pulmonary embolus. Limited evaluation more distally due to artifact and timing of contrast. 2. Indeterminate mediastinal and right interlobular lymphadenopathy. Multiple prominent mediastinal lymph nodes otherwise.  Recommend attention  on follow-up. 3. Aortic Atherosclerosis (ICD10-I70.0) and Emphysema (ICD10-J43.9). Electronically Signed   By: Tish Frederickson M.D.   On: 02/04/2022 19:52   US Venous Img Lower Unilateral Left  Result Date: 02/04/2022 CLINICAL DATA:  Left lower extremity swelling. EXAM: LEFT LOWER EXTREMITY VENOUS DOPPLER ULTRASOUND TECHNIQUE: Gray-scale sonography with compression, as well as color and duplex ultrasound, were performed to evaluate the deep venous system(s) from the level of the common femoral vein through the popliteal and proximal calf veins. COMPARISON:  None Available. FINDINGS: VENOUS Normal compressibility of the LEFT common femoral, LEFT superficial femoral, and LEFT popliteal veins. No filling defects to suggest DVT on grayscale or color Doppler imaging. Doppler waveforms show normal direction of venous flow, normal respiratory plasticity and response to augmentation. Limited views of the contralateral common femoral vein are unremarkable. OTHER A 5.4 cm hypoechoic area is seen within the region of the LEFT groin. No abnormal flow is seen within this region on color Doppler evaluation. Edematous soft tissue is seen throughout the LEFT ankle. Limitations: The LEFT great saphenous vein, LEFT posterior tibial vein and left peroneal vein are poorly visualized. IMPRESSION: 1. Limited study, as described above, without evidence of DVT within the LEFT lower extremity. 2. Findings which may represent a LEFT groin hematoma. Correlation with physical examination is recommended. Additional CT evaluation should be considered. 3. LEFT ankle soft tissue edema. Electronically Signed   By: Aram Candela M.D.   On: 02/04/2022 19:27   DG Chest 2 View  Result Date: 02/04/2022 CLINICAL DATA:  Chest pain. EXAM: CHEST - 2 VIEW COMPARISON:  October 21, 2021. FINDINGS: The heart size and mediastinal contours are within normal limits. Both lungs are clear. Status post coronary artery bypass graft. The visualized skeletal  structures are unremarkable. IMPRESSION: No active cardiopulmonary disease. Electronically Signed   By: Lupita Raider M.D.   On: 02/04/2022 15:29     Assessment and Plan:   # CAD s/p CABG # Chest pain # HTN # HLD  -History of CAD status post CABG by Dr. [name]  12/27/2004 for left main/three-vessel disease.  He had performed cardiac catheterization on him 06/28/2020 revealing patent grafts with an apical LAD lesion. It was thought that unlikely that culprit lesion would be causing his chest pain symptoms.  -Now coming again with atypical chest pain on and off. Troponin only mildly elevated most likely due to demand after vascular surgery -Unsure if this apical LAD lesion is stentable. Will get a stress test first -Continue Aspirin and Plavix -Intolerant to statin- start zetia -Continue beta blocker and Imdur -Check Echo -Telemetry   For questions or updates, please contact [facility] Please consult [email or web address] for contact info under    Signed, Hermelinda Dellen, MD  02/05/2022 1:27 AM

## 2022-02-06 ENCOUNTER — Other Ambulatory Visit (HOSPITAL_COMMUNITY): Payer: Self-pay

## 2022-02-06 DIAGNOSIS — R778 Other specified abnormalities of plasma proteins: Secondary | ICD-10-CM | POA: Diagnosis not present

## 2022-02-06 DIAGNOSIS — I10 Essential (primary) hypertension: Secondary | ICD-10-CM | POA: Diagnosis not present

## 2022-02-06 DIAGNOSIS — R079 Chest pain, unspecified: Secondary | ICD-10-CM | POA: Diagnosis not present

## 2022-02-06 DIAGNOSIS — E119 Type 2 diabetes mellitus without complications: Secondary | ICD-10-CM | POA: Diagnosis not present

## 2022-02-06 LAB — BASIC METABOLIC PANEL
Anion gap: 10 (ref 5–15)
BUN: 12 mg/dL (ref 8–23)
CO2: 22 mmol/L (ref 22–32)
Calcium: 8.7 mg/dL — ABNORMAL LOW (ref 8.9–10.3)
Chloride: 108 mmol/L (ref 98–111)
Creatinine, Ser: 0.8 mg/dL (ref 0.61–1.24)
GFR, Estimated: >60 mL/min (ref >60)
Glucose, Bld: 129 mg/dL — ABNORMAL HIGH (ref 70–99)
Potassium: 4 mmol/L (ref 3.5–5.1)
Sodium: 140 mmol/L (ref 135–145)

## 2022-02-06 LAB — GLUCOSE, CAPILLARY
Glucose-Capillary: 124 mg/dL — ABNORMAL HIGH (ref 70–99)
Glucose-Capillary: 162 mg/dL — ABNORMAL HIGH (ref 70–99)

## 2022-02-06 MED ORDER — METOPROLOL SUCCINATE ER 50 MG PO TB24
50.0000 mg | ORAL_TABLET | Freq: Two times a day (BID) | ORAL | 0 refills | Status: DC
  Filled 2022-02-06: qty 30, 15d supply, fill #0

## 2022-02-06 MED ORDER — ISOSORBIDE MONONITRATE ER 60 MG PO TB24
60.0000 mg | ORAL_TABLET | Freq: Every day | ORAL | 3 refills | Status: DC
  Filled 2022-02-06: qty 90, 90d supply, fill #0

## 2022-02-06 NOTE — Care Management Obs Status (Signed)
[place] NOTIFICATION   Patient Details  Name: [name] MRN: [id number] Date of Birth: 1940-09-16   Medicare Observation Status Notification Given:  Yes    Angelita Ingles, RN 02/06/2022, 8:01 AM

## 2022-02-06 NOTE — Plan of Care (Signed)
  Problem: Education: Goal: Understanding of CV disease, CV risk reduction, and recovery process will improve Outcome: Completed/Met Goal: Individualized Educational Video(s) Outcome: Completed/Met   Problem: Activity: Goal: Ability to return to baseline activity level will improve Outcome: Completed/Met   Problem: Cardiovascular: Goal: Ability to achieve and maintain adequate cardiovascular perfusion will improve Outcome: Completed/Met Goal: Vascular access site(s) Level 0-1 will be maintained Outcome: Completed/Met   Problem: Health Behavior/Discharge Planning: Goal: Ability to safely manage health-related needs after discharge will improve Outcome: Completed/Met   Problem: Education: Goal: Knowledge of prescribed regimen will improve Outcome: Completed/Met   Problem: Activity: Goal: Ability to tolerate increased activity will improve Outcome: Completed/Met   Problem: Bowel/Gastric: Goal: Gastrointestinal status for postoperative course will improve Outcome: Completed/Met   Problem: Clinical Measurements: Goal: Postoperative complications will be avoided or minimized Outcome: Completed/Met Goal: Signs and symptoms of graft occlusion will improve Outcome: Completed/Met   Problem: Skin Integrity: Goal: Demonstration of wound healing without infection will improve Outcome: Completed/Met   Problem: Education: Goal: Knowledge of General Education information will improve Description: Including pain rating scale, medication(s)/side effects and non-pharmacologic comfort measures Outcome: Completed/Met   Problem: Health Behavior/Discharge Planning: Goal: Ability to manage health-related needs will improve Outcome: Completed/Met   Problem: Clinical Measurements: Goal: Ability to maintain clinical measurements within normal limits will improve Outcome: Completed/Met Goal: Will remain free from infection Outcome: Completed/Met Goal: Diagnostic test results will  improve Outcome: Completed/Met Goal: Respiratory complications will improve Outcome: Completed/Met Goal: Cardiovascular complication will be avoided Outcome: Completed/Met   Problem: Activity: Goal: Risk for activity intolerance will decrease Outcome: Completed/Met   Problem: Nutrition: Goal: Adequate nutrition will be maintained Outcome: Completed/Met   Problem: Coping: Goal: Level of anxiety will decrease Outcome: Completed/Met   Problem: Elimination: Goal: Will not experience complications related to bowel motility Outcome: Completed/Met Goal: Will not experience complications related to urinary retention Outcome: Completed/Met   Problem: Pain Managment: Goal: General experience of comfort will improve Outcome: Completed/Met   Problem: Safety: Goal: Ability to remain free from injury will improve Outcome: Completed/Met   Problem: Skin Integrity: Goal: Risk for impaired skin integrity will decrease Outcome: Completed/Met   Problem: Education: Goal: Ability to describe self-care measures that may prevent or decrease complications (Diabetes Survival Skills Education) will improve Outcome: Completed/Met Goal: Individualized Educational Video(s) Outcome: Completed/Met   Problem: Coping: Goal: Ability to adjust to condition or change in health will improve Outcome: Completed/Met   Problem: Fluid Volume: Goal: Ability to maintain a balanced intake and output will improve Outcome: Completed/Met   Problem: Health Behavior/Discharge Planning: Goal: Ability to identify and utilize available resources and services will improve Outcome: Completed/Met Goal: Ability to manage health-related needs will improve Outcome: Completed/Met   Problem: Metabolic: Goal: Ability to maintain appropriate glucose levels will improve Outcome: Completed/Met   Problem: Nutritional: Goal: Maintenance of adequate nutrition will improve Outcome: Completed/Met Goal: Progress toward  achieving an optimal weight will improve Outcome: Completed/Met   Problem: Skin Integrity: Goal: Risk for impaired skin integrity will decrease Outcome: Completed/Met   Problem: Tissue Perfusion: Goal: Adequacy of tissue perfusion will improve Outcome: Completed/Met

## 2022-02-06 NOTE — Discharge Summary (Addendum)
Physician Discharge Summary  [name] WUJ:[id number] DOB: 07-Apr-1941 DOA: 02/04/2022  PCP: [name], PA-C  Admit date: 02/04/2022 Discharge date: 02/06/2022  Admitted From: Home Disposition:  home  Recommendations for Outpatient Follow-up:  Follow up with cardiology in 1-2 weeks Please obtain BMP/CBC in one week   Home Health:No Equipment/Devices:None  Discharge Condition:Stable CODE STATUS:Full Diet recommendation: Heart Healthy   Brief/Interim Summary: 81 y.o. 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  He relates his chest pain is relieved by nitro cardiology has been consulted  Discharge Diagnoses:  Principal Problem:   Chest pain Active Problems:   HTN (hypertension)   Dyslipidemia   PAD (peripheral artery disease) (HCC)   Normocytic anemia   DM2 (diabetes mellitus, type 2) (HCC)   Elevated troponin   Coronary artery disease involving coronary bypass graft of native heart with unstable angina pectoris (HCC)  Atypical chest pain: Cardiac biomarkers have basically remained flat. He was continue on beta-blocker, aspirin and Plavix and Imdur. Cardiology was consulted recommended a stress test reversible small defect in the basal to mid anterior wall consistent with ischemia. 2D echo showed an EF of 50% no regional wall motion abnormality. Cardiology recommended conservative management.  Essential hypertension: No changes made to his medication continue Lopressor and Imdur.  Hypokalemia: Replete orally now resolved.  Dyslipidemia: Continue Setia, he  has statin intolerance.  Peripheral vascular disease with a recent femoral-popliteal bypass and femoral endarterectomy on 01/28/2022: Currently asymptomatic no changes made to his medication continue aspirin and Plavix.  Normocytic anemia: No signs of overt bleeding hemoglobin has remained stable.  Leukocytosis: Admission 10.6 repeated in the morning 8.6 has remained afebrile leukocytosis resolved.  Diabetes mellitus type 2 with a last hemoglobin A1c of 7.3: No change made to his medication continue current regimen.  Discharge Instructions  Discharge Instructions     Diet - low sodium heart healthy   Complete by: As directed    Increase activity slowly   Complete by: As directed    No wound care   Complete by: As directed       Allergies as of 02/06/2022       Reactions   Crestor [rosuvastatin] Other (See Comments)   Myalgias   Lipitor [atorvastatin] Other (See Comments)   Myalgias    Zestril [lisinopril] Cough        Medication List     STOP taking these medications    cephALEXin 500 MG capsule Commonly known as: KEFLEX   doxycycline 100 MG capsule Commonly known as: VIBRAMYCIN   metoprolol tartrate 50 MG tablet Commonly known as: LOPRESSOR       TAKE these medications    amLODipine 10 MG tablet Commonly known as: NORVASC TAKE 1 TABLET BY MOUTH EVERY DAY Notes to patient: Today at 0923   aspirin 81 MG tablet Take 81 mg by mouth daily. Notes to patient: Today at [phone]   clopidogrel 75 MG tablet Commonly known as: PLAVIX TAKE 1 TABLET BY MOUTH EVERY DAY Notes to patient: Today at 0923   CoQ10 200 MG Caps Take 200 mg by mouth daily.   famotidine 20 MG tablet  Commonly known as: PEPCID Take 1 tablet (20 mg total) by mouth daily. Notes to patient: Today at 0923   Fish Oil 1200 MG Caps Take 2 capsules (2,400 mg total) by mouth every morning.   fluticasone 50 MCG/ACT nasal spray Commonly known as: FLONASE Place 2 sprays into both nostrils  daily.   isosorbide mononitrate 60 MG 24 hr tablet Commonly known as: IMDUR Take 1 tablet (60 mg total) by mouth daily. Notes to patient: Today at 0923   levocetirizine 5 MG tablet Commonly known as: XYZAL Take 1 tablet (5 mg total) by mouth every evening.   levofloxacin 750 MG tablet Commonly known as: LEVAQUIN Take 750 mg by mouth daily. 30 day course.   metFORMIN 500 MG tablet Commonly known as: GLUCOPHAGE Take 1 tablet (500 mg total) by mouth 2 (two) times daily with a meal.   metoprolol succinate 50 MG 24 hr tablet Commonly known as: TOPROL-XL Take 1 tablet (50 mg total) by mouth 2 (two) times daily. Take with or immediately following a meal. Notes to patient: Today at 0923   nitroGLYCERIN 0.4 MG SL tablet Commonly known as: NITROSTAT Place 1 tablet (0.4 mg total) under the tongue every 5 (five) minutes as needed for chest pain.   oxyCODONE-acetaminophen 5-325 MG tablet Commonly known as: PERCOCET/ROXICET Take 1 tablet by mouth every 6 (six) hours as needed for severe pain.   Praluent 150 MG/ML Soaj Generic drug: Alirocumab INJECT 150 MG INTO THE SKIN EVERY 14 (FOURTEEN) DAYS.   silver sulfADIAZINE 1 % cream Commonly known as: Silvadene Apply 1 Application topically 2 (two) times daily.   Vitamin D 50 MCG (2000 UT) tablet Take 2,000 Units by mouth daily.        Follow-up Information     Azalee Course, [place] Follow up on 02/12/2022.   Specialties: Cardiology, Radiology Why: at 10:55am for your follow up appt with Dr. Renelda Mom' PA [facility] information: [address] [place] [address] [phone]                Allergies  Allergen Reactions   Crestor [Rosuvastatin] Other (See Comments)    Myalgias   Lipitor [Atorvastatin] Other (See Comments)    Myalgias    Zestril [Lisinopril] Cough    Consultations: Cardiology   Procedures/Studies: NM Myocar Multi W/Spect W/Wall Motion / EF  Result Date: 02/05/2022   Findings are  consistent with ischemia. The study is intermediate risk.   No ST deviation was noted.   LV perfusion is abnormal. There is evidence of ischemia. Defect 1: There is a small defect with mild reduction in uptake present in the mid to basal anterior location(s) that is reversible. There is normal wall motion in the defect area. Consistent with ischemia.   Left ventricular function is normal. The left ventricular ejection fraction is normal (55-65%). Reversible perfusion defect in basal to mid anterior wall consistent with ischemia Intermediate risk study given anterior ischemia, though area of ischemia is small.     ECHOCARDIOGRAM COMPLETE  Result Date: 02/05/2022    ECHOCARDIOGRAM REPORT   Patient Name:   [name] Date of Exam: 02/05/2022 Medical Rec #:  [id number]   Height:       73.0 in Accession #:    [id number]  Weight:       214.7 lb Date of Birth:  Aug 23, 1940  BSA:          2.218 m Patient Age:    80 years    BP:  127/64 mmHg Patient Gender: M           HR:           78 bpm. Exam Location:  Inpatient Procedure: 2D Echo, Cardiac Doppler and Color Doppler Indications:    Chest pain  History:        Patient has no prior history of Echocardiogram examinations.                 Prior CABG; Risk Factors:Hypertension, Diabetes and Former                 Smoker. PAD. Orthopnea.  Sonographer:    Ross Ludwig RDCS (AE) Referring Phys: Therisa Doyne  Sonographer Comments: No subcostal window. IMPRESSIONS  1. Left ventricular ejection fraction, by estimation, is 50 to 55%. The left ventricle has low normal function. The left ventricle has no regional wall motion abnormalities. Left ventricular diastolic parameters are indeterminate.  2. Right ventricular systolic function is normal. The right ventricular size is normal.  3. The mitral valve is normal in structure. No evidence of mitral valve regurgitation. No evidence of mitral stenosis. Moderate mitral annular calcification.  4. The aortic valve is grossly  normal. There is mild calcification of the aortic valve. There is mild thickening of the aortic valve. Aortic valve regurgitation is not visualized. Aortic valve sclerosis/calcification is present, without any evidence of aortic stenosis. Comparison(s): No prior Echocardiogram. Conclusion(s)/Recommendation(s): Otherwise normal echocardiogram, with minor abnormalities described in the report. FINDINGS  Left Ventricle: Left ventricular ejection fraction, by estimation, is 50 to 55%. The left ventricle has low normal function. The left ventricle has no regional wall motion abnormalities. The left ventricular internal cavity size was normal in size. There is no left ventricular hypertrophy. Abnormal (paradoxical) septal motion consistent with post-operative status. Left ventricular diastolic parameters are indeterminate. Right Ventricle: The right ventricular size is normal. No increase in right ventricular wall thickness. Right ventricular systolic function is normal. Left Atrium: Left atrial size was normal in size. Right Atrium: Right atrial size was normal in size. Pericardium: There is no evidence of pericardial effusion. Mitral Valve: The mitral valve is normal in structure. Moderate mitral annular calcification. No evidence of mitral valve regurgitation. No evidence of mitral valve stenosis. Tricuspid Valve: The tricuspid valve is normal in structure. Tricuspid valve regurgitation is trivial. No evidence of tricuspid stenosis. Aortic Valve: The aortic valve is grossly normal. There is mild calcification of the aortic valve. There is mild thickening of the aortic valve. Aortic valve regurgitation is not visualized. Aortic valve sclerosis/calcification is present, without any evidence of aortic stenosis. Aortic valve mean gradient measures 4.0 mmHg. Aortic valve peak gradient measures 7.4 mmHg. Aortic valve area, by VTI measures 2.62 cm. Pulmonic Valve: The pulmonic valve was not well visualized. Pulmonic valve  regurgitation is not visualized. Aorta: The ascending aorta was not well visualized and the aortic root is normal in size and structure. Venous: The inferior vena cava was not well visualized. IAS/Shunts: The atrial septum is grossly normal.  LEFT VENTRICLE PLAX 2D LVIDd:         5.30 cm   Diastology LVIDs:         4.10 cm   LV e' medial:    6.53 cm/s LV PW:         1.10 cm   LV E/e' medial:  18.1 LV IVS:        0.90 cm   LV e' lateral:   8.27 cm/s LVOT diam:  2.10 cm   LV E/e' lateral: 14.3 LV SV:         72 LV SV Index:   33 LVOT Area:     3.46 cm  RIGHT VENTRICLE RV Basal diam:  3.50 cm RV S prime:     12.40 cm/s TAPSE (M-mode): 3.0 cm LEFT ATRIUM             Index        RIGHT ATRIUM           Index LA diam:        4.00 cm 1.80 cm/m   RA Area:     21.50 cm LA Vol (A2C):   45.6 ml 20.56 ml/m  RA Volume:   63.10 ml  28.45 ml/m LA Vol (A4C):   45.5 ml 20.52 ml/m LA Biplane Vol: 50.0 ml 22.55 ml/m  AORTIC VALVE AV Area (Vmax):    2.73 cm AV Area (Vmean):   2.47 cm AV Area (VTI):     2.62 cm AV Vmax:           136.00 cm/s AV Vmean:          85.300 cm/s AV VTI:            0.276 m AV Peak Grad:      7.4 mmHg AV Mean Grad:      4.0 mmHg LVOT Vmax:         107.00 cm/s LVOT Vmean:        60.900 cm/s LVOT VTI:          0.209 m LVOT/AV VTI ratio: 0.76  AORTA Ao Root diam: 3.60 cm MITRAL VALVE MV Area (PHT): 4.54 cm     SHUNTS MV Decel Time: 167 msec     Systemic VTI:  0.21 m MV E velocity: 118.00 cm/s  Systemic Diam: 2.10 cm MV A velocity: 89.60 cm/s MV E/A ratio:  1.32 Jodelle Red MD Electronically signed by Jodelle Red MD Signature Date/Time: 02/05/2022/11:32:23 AM    Final    CT Angio Chest PE W and/or Wo Contrast  Result Date: 02/04/2022 CLINICAL DATA:  Pulmonary embolism (PE) suspected, high prob. 81 y/o male states he went to his PCP this morning and was told he has an elevated troponin level. Pt was originally at PCP to be evaluated for intermittent left sided chest pain that has  been going on x 1 yearhat EXAM: CT ANGIOGRAPHY CHEST WITH CONTRAST TECHNIQUE: Multidetector CT imaging of the chest was performed using the standard protocol during bolus administration of intravenous contrast. Multiplanar CT image reconstructions and MIPs were obtained to evaluate the vascular anatomy. RADIATION DOSE REDUCTION: This exam was performed according to the departmental dose-optimization program which includes automated exposure control, adjustment of the mA and/or kV according to patient size and/or use of iterative reconstruction technique. CONTRAST:  75mL OMNIPAQUE IOHEXOL 350 MG/ML SOLN COMPARISON:  Chest x-ray 02/04/2022 FINDINGS: Cardiovascular: Satisfactory opacification of the pulmonary arteries to the segmental level. No evidence of pulmonary embolism. Limited evaluation of the subsegmental level due to timing of contrast and respiratory motion artifact. Normal heart size. No significant pericardial effusion. The thoracic aorta is normal in caliber. Severe atherosclerotic plaque of the thoracic aorta. Four-vessel coronary artery calcifications status post coronary artery bypass graft. Mediastinum/Nodes: Multiple prominent mediastinal lymph nodes including an enlarged 1.2 cm right paratracheal lymph node (6:103) and posterior mediastinal paraesophageal pericentimeter lymph nodes (4: 49, 51). Enlarged right interlobular lymph node measuring 1.4 cm (6:167). No hilar or  axillary lymph nodes. Thyroid gland, trachea, and esophagus demonstrate no significant findings. Tiny hiatal hernia. Lungs/Pleura: Moderate centrilobular emphysematous changes. No focal consolidation. No pulmonary nodule. No pulmonary mass. No pleural effusion. No pneumothorax. Upper Abdomen: No acute abnormality. Punctate left calcification within the kidney that may be vascular. Atherosclerotic plaque. Musculoskeletal: No chest wall abnormality. No suspicious lytic or blastic osseous lesions. No acute displaced fracture. Multilevel  degenerative changes of the spine. Review of the MIP images confirms the above findings. IMPRESSION: 1. No central or segmental pulmonary embolus. Limited evaluation more distally due to artifact and timing of contrast. 2. Indeterminate mediastinal and right interlobular lymphadenopathy. Multiple prominent mediastinal lymph nodes otherwise. Recommend attention on follow-up. 3. Aortic Atherosclerosis (ICD10-I70.0) and Emphysema (ICD10-J43.9). Electronically Signed   By: Tish Frederickson M.D.   On: 02/04/2022 19:52   US Venous Img Lower Unilateral Left  Result Date: 02/04/2022 CLINICAL DATA:  Left lower extremity swelling. EXAM: LEFT LOWER EXTREMITY VENOUS DOPPLER ULTRASOUND TECHNIQUE: Gray-scale sonography with compression, as well as color and duplex ultrasound, were performed to evaluate the deep venous system(s) from the level of the common femoral vein through the popliteal and proximal calf veins. COMPARISON:  None Available. FINDINGS: VENOUS Normal compressibility of the LEFT common femoral, LEFT superficial femoral, and LEFT popliteal veins. No filling defects to suggest DVT on grayscale or color Doppler imaging. Doppler waveforms show normal direction of venous flow, normal respiratory plasticity and response to augmentation. Limited views of the contralateral common femoral vein are unremarkable. OTHER A 5.4 cm hypoechoic area is seen within the region of the LEFT groin. No abnormal flow is seen within this region on color Doppler evaluation. Edematous soft tissue is seen throughout the LEFT ankle. Limitations: The LEFT great saphenous vein, LEFT posterior tibial vein and left peroneal vein are poorly visualized. IMPRESSION: 1. Limited study, as described above, without evidence of DVT within the LEFT lower extremity. 2. Findings which may represent a LEFT groin hematoma. Correlation with physical examination is recommended. Additional CT evaluation should be considered. 3. LEFT ankle soft tissue edema.  Electronically Signed   By: Aram Candela M.D.   On: 02/04/2022 19:27   DG Chest 2 View  Result Date: 02/04/2022 CLINICAL DATA:  Chest pain. EXAM: CHEST - 2 VIEW COMPARISON:  October 21, 2021. FINDINGS: The heart size and mediastinal contours are within normal limits. Both lungs are clear. Status post coronary artery bypass graft. The visualized skeletal structures are unremarkable. IMPRESSION: No active cardiopulmonary disease. Electronically Signed   By: Lupita Raider M.D.   On: 02/04/2022 15:29   VAS [place] LOWER EXTREMITY SAPHENOUS VEIN MAPPING  Result Date: 01/27/2022 LOWER EXTREMITY VEIN MAPPING Patient Name:  ANEUDY CHAMPLAIN  Date of Exam:   01/27/2022 Medical Rec #: [id number]    Accession #:    [id number] Date of Birth: 1941-01-23   Patient Gender: M Patient Age:   78 years Exam Location:  [facility] Procedure:      VAS [place] LOWER EXTREMITY SAPHENOUS VEIN MAPPING Referring Phys: Lemar Livings --------------------------------------------------------------------------------  Indications: pre op  Performing Technologist: [place] Lloyd RVS  Examination Guidelines: A complete evaluation includes B-mode imaging, spectral Doppler, color Doppler, and power Doppler as needed of all accessible portions of each vessel. Bilateral testing is considered an integral part of a complete examination. Limited examinations for reoccurring indications may be performed as noted. +---------------+-----------+----------------------+---------------+-----------+   RT Diameter  RT Findings         GSV  LT Diameter  LT Findings      (cm)                                            (cm)                  +---------------+-----------+----------------------+---------------+-----------+                               Saphenofemoral         0.25                                                   Junction                                   +---------------+-----------+----------------------+---------------+-----------+                               Proximal thigh         0.27                  +---------------+-----------+----------------------+---------------+-----------+                                 Mid thigh            0.27                  +---------------+-----------+----------------------+---------------+-----------+                                Distal thigh          0.44                  +---------------+-----------+----------------------+---------------+-----------+                                    Knee              0.15       branching  +---------------+-----------+----------------------+---------------+-----------+                                 Prox calf            0.27                  +---------------+-----------+----------------------+---------------+-----------+                                  Mid calf            0.29                  +---------------+-----------+----------------------+---------------+-----------+  Distal calf           0.35                  +---------------+-----------+----------------------+---------------+-----------+                                   Ankle              0.30                  +---------------+-----------+----------------------+---------------+-----------+ +----------------+-----------+---------------+----------------+-----------+ RT diameter (cm)RT Findings      SSV      LT Diameter (cm)LT Findings +----------------+-----------+---------------+----------------+-----------+                            Popliteal fossa      0.15                  +----------------+-----------+---------------+----------------+-----------+                             Proximal calf       0.16                  +----------------+-----------+---------------+----------------+-----------+                               Mid  calf          0.21                  +----------------+-----------+---------------+----------------+-----------+                              Distal calf        0.23                  +----------------+-----------+---------------+----------------+-----------+ Diagnosing physician: Sherald Hess MD Electronically signed by Sherald Hess MD on 01/27/2022 at 7:09:32 PM.    Final    MR FOOT LEFT W WO CONTRAST  Result Date: 01/27/2022 CLINICAL DATA:  Diabetic with great toe and 2nd toe ulcers. EXAM: MRI OF THE LEFT FOREFOOT WITHOUT AND WITH CONTRAST TECHNIQUE: Multiplanar, multisequence MR imaging of the left forefoot was performed both before and after administration of intravenous contrast. CONTRAST:  10mL GADAVIST GADOBUTROL 1 MMOL/ML IV SOLN COMPARISON:  Radiographs 01/01/2022 and 03/06/2021. FINDINGS: Bones/Joint/Cartilage There is heterogeneous fat saturation within the distal toes on the T2 weighted images. However, there is marrow hyperintensity within the distal 1st and distal 2nd phalanges on the sagittal inversion recovery images. There is low-level marrow enhancement in these areas. There may be mild cortical erosion of the distal tuft of the great toe on the T1 weighted images. The additional toes and metatarsals appear unremarkable. No significant joint effusions. Ligaments The alignment is normal at the Lisfranc joint. The Lisfranc ligament is intact. The collateral ligaments of the metatarsophalangeal joints appear intact. 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 No focal fluid collection or significant tenosynovitis. Soft tissues Apparent soft tissue ulceration over the distal 1st and 2nd phalanges with soft tissue edema and enhancement. Generalized edema within the dorsal subcutaneous fat. No focal fluid collections or foreign bodies are identified. IMPRESSION: 1. Marrow changes and  enhancement of the distal 1st and 2nd  phalanges, suspicious for early osteomyelitis given adjacent soft tissue abnormalities. 2. Homogeneous soft tissue enhancement in the distal 1st and 2nd toes without evidence of soft tissue abscess. Nonspecific dorsal forefoot muscular and subcutaneous edema. Electronically Signed   By: Carey Bullocks M.D.   On: 01/27/2022 16:20   PERIPHERAL VASCULAR CATHETERIZATION  Result Date: 01/27/2022 Images from the original result were not included. 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 Randie Heinz, MD Procedure Performed: 1.  Ultrasound-guided cannulation of the left common femoral artery 2.  Aortogram with pullback gradient 3.  Left lower extremity angiography Indications: 81 year old male with history of left lower extremity endovascular intervention and left common iliac artery stent.  He now has chronic limb threatening ischemia on the left with toe ulceration is indicated for repeat angiography to plan bypass surgery. Findings: The aorta and iliac segments are heavily calcified the stent is widely patent there is no pullback gradient or obvious stenosis by angiography.  The left lower extremity common femoral artery is subtotally occluded the SFA is occluded as is the popliteal artery for a long segment he reconstitutes below the knee appears to have dominant runoff via the anterior tibial artery although the peroneal distally is also patent. Plan for admission today and surgery tomorrow left common femoral to below-knee bypass likely to the anterior tibial artery..  Vein mapping ordered for today.  Procedure:  The patient was identified in the holding area and taken to room 8.  The patient was then placed supine on the table and prepped and draped in the usual sterile fashion.  A time out was  called.  Ultrasound was used to evaluate the left common femoral artery.  This was heavily calcified there was no pulsatility.  I anesthetized the area and then cannulated the artery with direct ultrasound visualization followed by wire and sheath.  An image was saved the permanent record.  I placed a Bentson wire under direct fluoroscopic guidance followed by Omni catheter to the level of L1 and performed aortogram and then exchanged for a straight catheter and performed a pullback gradient of which was less than 5 mmHg from the aorta to the sheath.  Followed by this we then performed left lower extremity angiography with the above findings and we will plan for bypass tomorrow.  Sheath will be pulled postoperative holding.  He tolerated the procedure without immediate complication. Contrast: 55cc Brandon C. Randie Heinz, MD [facility] Office: [phone] Pager: [phone]   (Echo, Carotid, EGD, Colonoscopy, ERCP)    Subjective: No further chest pain.  Discharge Exam: Vitals:   02/06/22 0743 02/06/22 0922  BP: 110/86 115/77  Pulse:  71  Resp: 20   Temp: 97.8 F (36.6 C)   SpO2:     Vitals:   02/05/22 2351 02/06/22 0407 02/06/22 0743 02/06/22 0922  BP: 114/61 (!) 108/58 110/86 115/77  Pulse: 89 67  71  Resp: Temp: 99.1 F (37.3 C) 98.7 F (37.1 C) 97.8 F (36.6 C)   TempSrc: Oral Oral    SpO2: 96% 95%    Weight:      Height:        General: Pt is alert, awake, not in acute distress Cardiovascular: RRR, S1/S2 +, no rubs, no gallops Respiratory: CTA bilaterally, no wheezing, no rhonchi Abdominal: Soft, NT, ND, bowel sounds +  Extremities: no edema, no cyanosis    The results of significant diagnostics from this hospitalization (including imaging, microbiology, ancillary and laboratory) are listed below for reference.     Microbiology: Recent Results (from the past 240 hour(s))  Surgical pcr screen     Status: None   Collection Time:  01/28/22  9:26 AM   Specimen: Nasal Mucosa; Nasal Swab  Result Value Ref Range Status   MRSA, PCR NEGATIVE NEGATIVE Final   Staphylococcus aureus NEGATIVE NEGATIVE Final    Comment: (NOTE) The Xpert SA Assay (FDA approved for NASAL specimens in patients 80 years of age and older), is one component of a comprehensive surveillance program. It is not intended to diagnose infection nor to guide or monitor treatment. Performed at [facility], [address] [place], [place] [id number]   Wound culture     Status: None (Preliminary result)   Collection Time: 02/04/22 11:02 AM   Specimen: Wound  Result Value Ref Range Status   MICRO NUMBER: [id number]  Preliminary   SPECIMEN QUALITY: Adequate  Preliminary   SOURCE: LEFT GROIN AREA  Preliminary   STATUS: PRELIMINARY  Preliminary   GRAM STAIN:   Preliminary    No white blood cells seen No epithelial cells seen No organisms seen   RESULT: Culture in progress  Preliminary     Labs: BNP (last 3 results) Recent Labs    10/21/21 1024  BNP 30   Basic Metabolic Panel: Recent Labs  Lab 02/04/22 1101 02/04/22 1457 02/05/22 0125 02/05/22 0211 02/06/22 0410  NA 141 139  --  140 140  K 3.6 3.5  --  3.4* 4.0  CL 106 108  --  107 108  CO2 26 24  --  25 22  GLUCOSE 140* 123*  --  140* 129*  BUN 14 14  --  12 12  CREATININE 0.72 0.76  --  0.91 0.80  CALCIUM 9.3 8.7*  --  8.6* 8.7*  MG  --   --  1.7  --   --   PHOS  --   --  3.9  --   --    Liver Function Tests: Recent Labs  Lab 02/04/22 1101 02/04/22 1457 02/05/22 0211  AST 20 26 21   ALT 15 16 14   ALKPHOS 60 60 46  BILITOT 1.5* 1.4* 1.4*  PROT 6.4 6.0* 5.6*  ALBUMIN 3.6 3.3* 3.0*   Recent Labs  Lab 02/04/22 1457  LIPASE 40   No results for input(s): "AMMONIA" in the last 168 hours. CBC: Recent Labs  Lab 02/04/22 1457 02/05/22 0211  WBC 10.6* 8.6  HGB 9.4* 9.0*  HCT 28.8* 27.4*  MCV 88.9 89.5  PLT 277 242   Cardiac Enzymes: No results for input(s):  "CKTOTAL", "CKMB", "CKMBINDEX", "TROPONINI" in the last 168 hours. BNP: Invalid input(s): "POCBNP" CBG: Recent Labs  Lab 02/05/22 1538 02/05/22 2009 02/05/22 2350 02/06/22 0405 02/06/22 0908  GLUCAP 132* 109* 128* 124* 162*   D-Dimer No results for input(s): "DDIMER" in the last 72 hours. Hgb A1c No results for input(s): "HGBA1C" in the last 72 hours. Lipid Profile Recent Labs    02/05/22 0211  CHOL 71  HDL 34*  LDLCALC 20  TRIG 83  CHOLHDL 2.1   Thyroid function studies Recent Labs    02/05/22 0125  TSH 2.213   Anemia work up Recent Labs    02/05/22 0125  VITAMINB12 192  FOLATE 8.0  FERRITIN 76  TIBC 291  IRON 33*  RETICCTPCT 3.3*   Urinalysis No results found for: "COLORURINE", "APPEARANCEUR", "LABSPEC", "PHURINE", "GLUCOSEU", "HGBUR", "BILIRUBINUR", "KETONESUR", "PROTEINUR", "UROBILINOGEN", "NITRITE", "LEUKOCYTESUR" Sepsis Labs Recent Labs  Lab 02/04/22 1457 02/05/22 0211  WBC 10.6* 8.6   Microbiology Recent Results (from the past 240 hour(s))  Surgical pcr screen     Status: None   Collection Time: 01/28/22  9:26 AM   Specimen: Nasal Mucosa; Nasal Swab  Result Value Ref Range Status   MRSA, PCR NEGATIVE NEGATIVE Final   Staphylococcus aureus NEGATIVE NEGATIVE Final    Comment: (NOTE) The Xpert SA Assay (FDA approved for NASAL specimens in patients 7 years of age and older), is one component of a comprehensive surveillance program. It is not intended to diagnose infection nor to guide or monitor treatment. Performed at [facility], [address], [place], [place] [address]   Wound culture     Status: None (Preliminary result)   Collection Time: 02/04/22 11:02 AM   Specimen: Wound  Result Value Ref Range Status   MICRO NUMBER: [id number]  Preliminary   SPECIMEN QUALITY: Adequate  Preliminary   SOURCE: LEFT GROIN AREA  Preliminary   STATUS: PRELIMINARY  Preliminary   GRAM STAIN:   Preliminary    No white blood cells seen No  epithelial cells seen No organisms seen   RESULT: Culture in progress  Preliminary     SIGNED:   Marinda Elk, MD  [facility] 02/06/2022, 10:23 AM Pager   If 7PM-7AM, please contact night-coverage [email or web address] Password [email or web address]

## 2022-02-07 LAB — WOUND CULTURE
MICRO NUMBER:: 13937547
SPECIMEN QUALITY:: ADEQUATE

## 2022-02-12 ENCOUNTER — Encounter (HOSPITAL_BASED_OUTPATIENT_CLINIC_OR_DEPARTMENT_OTHER): Payer: Medicare HMO | Admitting: Physician Assistant

## 2022-02-12 ENCOUNTER — Ambulatory Visit: Payer: Medicare HMO | Attending: Physician Assistant | Admitting: Physician Assistant

## 2022-02-12 ENCOUNTER — Encounter: Payer: Self-pay | Admitting: Physician Assistant

## 2022-02-12 ENCOUNTER — Ambulatory Visit (INDEPENDENT_AMBULATORY_CARE_PROVIDER_SITE_OTHER): Payer: Medicare HMO | Admitting: Physician Assistant

## 2022-02-12 VITALS — BP 120/58 | HR 68 | Ht 73.0 in | Wt 220.2 lb

## 2022-02-12 VITALS — Temp 97.6°F | Ht 73.0 in | Wt 219.0 lb

## 2022-02-12 DIAGNOSIS — I25810 Atherosclerosis of coronary artery bypass graft(s) without angina pectoris: Secondary | ICD-10-CM

## 2022-02-12 DIAGNOSIS — I10 Essential (primary) hypertension: Secondary | ICD-10-CM | POA: Diagnosis not present

## 2022-02-12 DIAGNOSIS — I739 Peripheral vascular disease, unspecified: Secondary | ICD-10-CM | POA: Diagnosis not present

## 2022-02-12 DIAGNOSIS — E785 Hyperlipidemia, unspecified: Secondary | ICD-10-CM

## 2022-02-12 DIAGNOSIS — L97522 Non-pressure chronic ulcer of other part of left foot with fat layer exposed: Secondary | ICD-10-CM | POA: Diagnosis present

## 2022-02-12 DIAGNOSIS — E11621 Type 2 diabetes mellitus with foot ulcer: Secondary | ICD-10-CM | POA: Diagnosis not present

## 2022-02-12 DIAGNOSIS — E1151 Type 2 diabetes mellitus with diabetic peripheral angiopathy without gangrene: Secondary | ICD-10-CM | POA: Diagnosis not present

## 2022-02-12 NOTE — Progress Notes (Addendum)
[place], [place] ([id number]) [id number] Page 1 of 9 Visit Report for 02/12/2022 Chief Complaint Document Details Patient Name: Date of Service: [name] [facility] RD 02/12/2022 8:15 A M Medical Record Number: [id number] Patient Account Number: [id number] Date of Birth/Sex: Treating RN: 19-Jan-1941 (81 y.o. M) Primary Care Provider: Mackie Pai Other Clinician: Referring Provider: Treating Provider/Extender: Sheran Lawless in Treatment: 6 Information Obtained from: Patient Chief Complaint Left 1st and 2nd toe ulcers Electronic Signature(s) Signed: 02/12/2022 8:38:15 AM By: Worthy Keeler PA-C Entered By: Worthy Keeler on 02/12/2022 08:38:15 -------------------------------------------------------------------------------- Debridement Details Patient Name: Date of Service: [name], A LFO RD 02/12/2022 8:15 A M Medical Record Number: [id number] Patient Account Number: [id number] Date of Birth/Sex: Treating RN: March 24, 1941 (81 y.o. Burnadette Pop, [name] Primary Care Provider: Mackie Pai Other Clinician: Referring Provider: Treating Provider/Extender: Sheran Lawless in Treatment: 6 Debridement Performed for Assessment: Wound #2 Left T Second oe Performed By: Physician Worthy Keeler, PA Debridement Type: Debridement Severity of Tissue Pre Debridement: Necrosis of bone Level of Consciousness (Pre-procedure): Awake and Alert Pre-procedure Verification/Time Out Yes - 09:07 Taken: Start Time: 09:07 Pain Control: Lidocaine T Area Debrided (L x W): otal 0.4 (cm) x 0.5 (cm) = 0.2 (cm) Tissue and other material debrided: Viable, Non-Viable, Bone, Slough, Subcutaneous, Slough Level: Skin/Subcutaneous Tissue/Muscle/Bone Debridement Description: Excisional Instrument: Curette Bleeding: Minimum Hemostasis Achieved: Pressure End Time: 09:07 Procedural Pain: 0 Post Procedural Pain: 0 Response to Treatment: Procedure  was tolerated well Level of Consciousness (Post- Awake and Alert procedure): Post Debridement Measurements of Total Wound Length: (cm) 0.4 Width: (cm) 0.5 Depth: (cm) 0.3 Volume: (cm) 0.047 Character of Wound/Ulcer Post Debridement: Improved Severity of Tissue Post Debridement: Necrosis of bone [place], Lamarr ([id number]) [id number] Page 2 of 9 Post Procedure Diagnosis Same as Pre-procedure Electronic Signature(s) Signed: 02/12/2022 5:27:13 PM By: Worthy Keeler PA-C Signed: 02/13/2022 4:19:15 PM By: Rhae Hammock RN Entered By: Rhae Hammock on 02/12/2022 09:15:08 -------------------------------------------------------------------------------- Debridement Details Patient Name: Date of Service: [name], A LFO RD 02/12/2022 8:15 A M Medical Record Number: [id number] Patient Account Number: [id number] Date of Birth/Sex: Treating RN: August 11, 1940 (81 y.o. Burnadette Pop, [name] Primary Care Provider: Mackie Pai Other Clinician: Referring Provider: Treating Provider/Extender: Sheran Lawless in Treatment: 6 Debridement Performed for Assessment: Wound #1 Left T Great oe Performed By: Physician Worthy Keeler, PA Debridement Type: Debridement Severity of Tissue Pre Debridement: Fat layer exposed Level of Consciousness (Pre-procedure): Awake and Alert Pre-procedure Verification/Time Out Yes - 09:07 Taken: Start Time: 09:07 Pain Control: Lidocaine T Area Debrided (L x W): otal 4.4 (cm) x 1 (cm) = 4.4 (cm) Tissue and other material debrided: Viable, Non-Viable, Slough, Subcutaneous, Biofilm, Slough Level: Skin/Subcutaneous Tissue Debridement Description: Excisional Instrument: Curette Bleeding: Minimum Hemostasis Achieved: Pressure End Time: 09:07 Procedural Pain: 0 Post Procedural Pain: 0 Response to Treatment: Procedure was tolerated well Level of Consciousness (Post- Awake and Alert procedure): Post Debridement  Measurements of Total Wound Length: (cm) 4.4 Width: (cm) 1 Depth: (cm) 0.2 Volume: (cm) 0.691 Character of Wound/Ulcer Post Debridement: Improved Severity of Tissue Post Debridement: Fat layer exposed Post Procedure Diagnosis Same as Pre-procedure Notes documented by Rhae Hammock, RN Electronic Signature(s) Signed: 02/13/2022 4:19:15 PM By: Rhae Hammock RN Signed: 03/18/2022 4:08:36 PM By: Worthy Keeler PA-C Previous Signature: 02/12/2022 5:27:13 PM Version By: Worthy Keeler PA-C Entered By: Rhae Hammock on 02/13/2022 11:56:27 [place], Vamsi ([id number]) [id number] Page 3 of 9 --------------------------------------------------------------------------------  HPI Details Patient Name: Date of Service: Evelina Bucy LFO RD 02/12/2022 8:15 A M Medical Record Number: [id number] Patient Account Number: [id number] Date of Birth/Sex: Treating RN: [birth year] (81 y.o. M) Primary Care Provider: [name] Other Clinician: Referring Provider: Treating Provider/Extender: [name] in Treatment: 6 History of Present Illness HPI Description: 01-01-2022 upon evaluation today patient appears to be doing poorly in regard to her wound that is on the left great toe and second toe location. The toenail of the second toe covers over a portion of the wound which I am concerned may be hiding a piece of exposed bone based on what I see today. The patient does have a history of peripheral vascular disease he is a patient of Dr. Gery Pray who did an abdominal aortogram previous and was unable to intervene with regard to the lower extremity blood flow. The patient had a right ABI which was noncompressible and a left ABI was 0.6 with TBI's of 0.26 unfortunately. The TBI on the right was 0.62 which was not nearly as bad. Nonetheless the unfortunate thing is that with poor blood flow and potential for bone exposure this becomes increasingly  complicated if the patient does have osteomyelitis to get this to heal. He does have repeat vascular testing on Monday upcoming. The patient is stated to be "prediabetic" according to notes and the patient. Currently he has been on Keflex. Patient has a history of peripheral vascular disease, prediabetes, hypertension, and unfortunately the current wound which has been present for 4 weeks based on what they tell me today. 01-08-2022 upon evaluation today patient appears to be doing about the same in regard to his wound. Again the x-ray was negative that was performed on 17th everything looks to be okay. With that being said he is continuing to have issues here with poor blood flow and perfusion he did have a repeat arterial study and ABI with TBI TBI could not even be obtained because the signals were so dampened. Previously he had been at 0.2. In regard to the ABI was previously around 0.65 he was now around 0.45. Again this was definitely a decrease on the left side compared to previous the right was stable. He is stated to have critical limb ischemia. 01-15-2022 upon evaluation today patient appears to be doing well currently in regard to his wounds all things considered. Were seeing more growth on the big toe versus the second toe but this is to be expected at the second toe does have some bone exposed. Fortunately I do not see any evidence of active infection at this time. He does have an appointment scheduled for September 11 which is Monday for an arteriogram and then subsequently the 12th for bypass surgery. Subsequently I think this is can be very good for him as far as trying to get this healed I believe it is good to be the best plan that we have as far as getting good blood flow to allow for healing to continue. Once we get good blood flow I think we can then clean away some of the necrotic bone if necessary to try and allow this to heal more effectively. We also discussed briefly hyperbaric  oxygen therapy and the possibility of distal toe amputation if need be but again right now we are still on the plantar stages of even knowing what we can do from a wound care perspective which I think is the first initial goal here. 01-22-2022 upon evaluation  today patient appears to be doing better currently in regard to his wounds all things considered. He actually is good to be having his vascular procedures upcoming next week. Nonetheless I think once we get better blood flow will be able to see where things stand. He is also going to have his MRI on Sunday which I think will also be very beneficial for [place] to know exactly what is going on with the second toe in particular and the way we will get a proceed following. 02-12-2022 upon evaluation today patient appears to be doing well currently in regard to his wounds in fact he is showing signs of significant improvement which is great news. Fortunately I see no evidence of active infection locally or systemically at this time which is great news he has had his bypass on the left lower extremity and this does seem to be doing much better compared to what we were previous. He is in fact about twice a small on the great toe as it was last time I saw him I am very pleased with where things stand with the second toe as well though there is still bone exposed there seems to be excellent blood flow which I think in turn is can allow this to actually heal that is what we want. Electronic Signature(s) Signed: 02/12/2022 9:19:00 AM By: Lenda Kelp PA-C Entered By: Lenda Kelp on 02/12/2022 09:19:00 -------------------------------------------------------------------------------- Physical Exam Details Patient Name: Date of Service: Evelina Bucy LFO RD 02/12/2022 8:15 A M Medical Record Number: [id number] Patient Account Number: [id number] Date of Birth/Sex: Treating RN: Jan 20, 1941 (81 y.o. M) Primary Care Provider: [name] Other  Clinician: Referring Provider: Treating Provider/Extender: [name] in Treatment: 6 Constitutional Well-nourished and well-hydrated in no acute distress. Respiratory [place], Peggy ([id number]) [id number] Page 4 of 9 normal breathing without difficulty. Psychiatric this patient is able to make decisions and demonstrates good insight into disease process. Alert and Oriented x 3. pleasant and cooperative. Notes Upon inspection patient's wound bed actually showed signs of the need for sharp debridement of both locations I was able to remove just slough and biofilm down to good subcutaneous tissue on the great toe on the second toe there was some necrotic bone that did have to be debrided but I was able to clear all that away and it looks to be very healthy. Electronic Signature(s) Signed: 02/12/2022 9:26:59 AM By: Lenda Kelp PA-C Entered By: Lenda Kelp on 02/12/2022 09:26:58 -------------------------------------------------------------------------------- Physician Orders Details Patient Name: Date of Service: Violet Baldy, A LFO RD 02/12/2022 8:15 A M Medical Record Number: [id number] Patient Account Number: [id number] Date of Birth/Sex: Treating RN: 05-09-1941 (81 y.o. [name] Primary Care Provider: [name] Other Clinician: Referring Provider: Treating Provider/Extender: [name] in Treatment: 6 Verbal / Phone Orders: No Diagnosis Coding ICD-10 Coding Code Description I73.89 Other specified peripheral vascular diseases L97.522 Non-pressure chronic ulcer of other part of left foot with fat layer exposed R73.03 Prediabetes I10 Essential (primary) hypertension Follow-up Appointments ppointment in 1 week. - w/ Allen Derry and [place] Rm # 9 Return A Anesthetic (In clinic) Topical Lidocaine 5% applied to wound bed Bathing/ Shower/ Hygiene May shower with protection but do not  get wound dressing(s) wet. Edema Control - Lymphedema / SCD / Other Elevate legs to the level of the heart or above for 30 minutes daily and/or when sitting, a frequency of: Avoid standing for long periods  of time. Off-Loading Other: - Keep pressure off of Left Foot/toes Wound Treatment Wound #1 - T Great oe Wound Laterality: Left Cleanser: Soap and Water 1 x Per Day/15 Days Discharge Instructions: May shower and wash wound with dial antibacterial soap and water prior to dressing change. Cleanser: Wound Cleanser (Generic) 1 x Per Day/15 Days Discharge Instructions: Cleanse the wound with wound cleanser prior to applying a clean dressing using gauze sponges, not tissue or cotton balls. Prim Dressing: Xeroform Occlusive Gauze Dressing, 4x4 in (Generic) 1 x Per Day/15 Days ary Discharge Instructions: Apply to wound bed as instructed Secondary Dressing: Woven Gauze Sponge, Non-Sterile 4x4 in (Generic) 1 x Per Day/15 Days Discharge Instructions: Apply over primary dressing as directed. Secured With: Insurance underwriter, Sterile 2x75 (in/in) (Generic) 1 x Per Day/15 Days Discharge Instructions: Secure with stretch gauze as directed. Secured With: 22M Medipore H Soft Cloth Surgical Tape, 4 x 10 (in/yd) (Generic) 1 x Per Day/15 Days [place], Siddharth ([id number]) [id number] Page 5 of 9 Discharge Instructions: Secure with tape as directed. Secured With: [employer] Size 2, 10 (yds) (Generic) 1 x Per Day/15 Days Wound #2 - T Second oe Wound Laterality: Left Cleanser: Soap and Water 1 x Per Day/15 Days Discharge Instructions: May shower and wash wound with dial antibacterial soap and water prior to dressing change. Cleanser: Wound Cleanser (Generic) 1 x Per Day/15 Days Discharge Instructions: Cleanse the wound with wound cleanser prior to applying a clean dressing using gauze sponges, not tissue or cotton balls. Prim Dressing: Promogran Prisma Matrix, 4.34 (sq  in) (silver collagen) 1 x Per Day/15 Days ary Discharge Instructions: Moisten collagen and put on wound bed of 2nd toe and then xeroform on top Prim Dressing: Xeroform Occlusive Gauze Dressing, 4x4 in (Generic) 1 x Per Day/15 Days ary Discharge Instructions: Apply to wound bed as instructed Secondary Dressing: Woven Gauze Sponge, Non-Sterile 4x4 in (Generic) 1 x Per Day/15 Days Discharge Instructions: Apply over primary dressing as directed. Secured With: Insurance underwriter, Sterile 2x75 (in/in) (Generic) 1 x Per Day/15 Days Discharge Instructions: Secure with stretch gauze as directed. Secured With: 22M Medipore H Soft Cloth Surgical T ape, 4 x 10 (in/yd) (Generic) 1 x Per Day/15 Days Discharge Instructions: Secure with tape as directed. Secured With: Stretch Net Size 2, 10 (yds) (Generic) 1 x Per Day/15 Days Consults Podiatry - Podiatry referral to Dr. Loreta Ave for new diabetic patient needing regular diabetic foot care/nail trimming. ***No wound care. Pt will continue coming to wound care clinic**** Electronic Signature(s) Signed: 02/12/2022 5:27:13 PM By: Lenda Kelp PA-C Signed: 02/13/2022 4:19:15 PM By: [name] RN Entered By: [name] on 02/12/2022 09:21:59 Prescription 02/12/2022 -------------------------------------------------------------------------------- Levonne Hubert PA Patient Name: Provider: [birth year] [id number] Date of Birth: NPI#Judie Petit [id number] Sex: DEA #: [phone] Phone #: License #: Eligha Bridegroom [facility] Patient Address: [address] [place], [place] [address] [address] [place], [place] [address] [phone] Allergies Crestor; Lipitor; lisinopril Provider's Orders Podiatry - Podiatry referral to Dr. Loreta Ave for new diabetic patient needing regular diabetic foot care/nail trimming. ***No wound care. Pt will continue coming to wound care  clinic**** Hand Signature: Date(s): Electronic Signature(s) Signed: 02/12/2022 5:27:13 PM By: Lenda Kelp PA-C Signed: 02/13/2022 4:19:15 PM By: [name] RN Lucretia Roers, Boubacar 02/13/2022 4:19:15 PM By: [name] RN Signed: ([id number]) [id number] Page 6 of 9 Entered By: [name] on 02/12/2022 09:22:00 -------------------------------------------------------------------------------- Problem List  Details Patient Name: Date of Service: Evelina Bucy LFO RD 02/12/2022 8:15 A M Medical Record Number: [id number] Patient Account Number: [id number] Date of Birth/Sex: Treating RN: 12-22-1940 (81 y.o. M) Primary Care Provider: [name] Other Clinician: Referring Provider: Treating Provider/Extender: [name] in Treatment: 6 Active Problems ICD-10 Encounter Code Description Active Date MDM Diagnosis I73.89 Other specified peripheral vascular diseases 01/01/2022 No Yes L97.522 Non-pressure chronic ulcer of other part of left foot with fat layer exposed 01/01/2022 No Yes L97.524 Non-pressure chronic ulcer of other part of left foot with necrosis of bone 02/12/2022 No Yes R73.03 Prediabetes 01/01/2022 No Yes I10 Essential (primary) hypertension 01/01/2022 No Yes Inactive Problems Resolved Problems Electronic Signature(s) Signed: 02/12/2022 9:28:02 AM By: Lenda Kelp PA-C Previous Signature: 02/12/2022 8:33:15 AM Version By: Lenda Kelp PA-C Entered By: Lenda Kelp on 02/12/2022 09:28:02 -------------------------------------------------------------------------------- Progress Note Details Patient Name: Date of Service: Violet Baldy, A LFO RD 02/12/2022 8:15 A M Medical Record Number: [id number] Patient Account Number: [id number] Date of Birth/Sex: Treating RN: Feb 23, 1941 (81 y.o. M) Primary Care Provider: [name] Other Clinician: Referring Provider: Treating Provider/Extender: [name] in Treatment: 6 Subjective Chief Complaint [place], [place] ([id number]) [id number] Page 7 of 9 Information obtained from Patient Left 1st and 2nd toe ulcers History of Present Illness (HPI) 01-01-2022 upon evaluation today patient appears to be doing poorly in regard to her wound that is on the left great toe and second toe location. The toenail of the second toe covers over a portion of the wound which I am concerned may be hiding a piece of exposed bone based on what I see today. The patient does have a history of peripheral vascular disease he is a patient of Dr. Gery Pray who did an abdominal aortogram previous and was unable to intervene with regard to the lower extremity blood flow. The patient had a right ABI which was noncompressible and a left ABI was 0.6 with TBI's of 0.26 unfortunately. The TBI on the right was 0.62 which was not nearly as bad. Nonetheless the unfortunate thing is that with poor blood flow and potential for bone exposure this becomes increasingly complicated if the patient does have osteomyelitis to get this to heal. He does have repeat vascular testing on Monday upcoming. The patient is stated to be "prediabetic" according to notes and the patient. Currently he has been on Keflex. Patient has a history of peripheral vascular disease, prediabetes, hypertension, and unfortunately the current wound which has been present for 4 weeks based on what they tell me today. 01-08-2022 upon evaluation today patient appears to be doing about the same in regard to his wound. Again the x-ray was negative that was performed on 17th everything looks to be okay. With that being said he is continuing to have issues here with poor blood flow and perfusion he did have a repeat arterial study and ABI with TBI TBI could not even be obtained because the signals were so dampened. Previously he had been at 0.2. In regard to the ABI was previously around 0.65 he  was now around 0.45. Again this was definitely a decrease on the left side compared to previous the right was stable. He is stated to have critical limb ischemia. 01-15-2022 upon evaluation today patient appears to be doing well currently in regard to his wounds all things considered. Were seeing more growth on the big toe versus the second toe but this is  to be expected at the second toe does have some bone exposed. Fortunately I do not see any evidence of active infection at this time. He does have an appointment scheduled for September 11 which is Monday for an arteriogram and then subsequently the 12th for bypass surgery. Subsequently I think this is can be very good for him as far as trying to get this healed I believe it is good to be the best plan that we have as far as getting good blood flow to allow for healing to continue. Once we get good blood flow I think we can then clean away some of the necrotic bone if necessary to try and allow this to heal more effectively. We also discussed briefly hyperbaric oxygen therapy and the possibility of distal toe amputation if need be but again right now we are still on the plantar stages of even knowing what we can do from a wound care perspective which I think is the first initial goal here. 01-22-2022 upon evaluation today patient appears to be doing better currently in regard to his wounds all things considered. He actually is good to be having his vascular procedures upcoming next week. Nonetheless I think once we get better blood flow will be able to see where things stand. He is also going to have his MRI on Sunday which I think will also be very beneficial for [place] to know exactly what is going on with the second toe in particular and the way we will get a proceed following. 02-12-2022 upon evaluation today patient appears to be doing well currently in regard to his wounds in fact he is showing signs of significant improvement which is great news.  Fortunately I see no evidence of active infection locally or systemically at this time which is great news he has had his bypass on the left lower extremity and this does seem to be doing much better compared to what we were previous. He is in fact about twice a small on the great toe as it was last time I saw him I am very pleased with where things stand with the second toe as well though there is still bone exposed there seems to be excellent blood flow which I think in turn is can allow this to actually heal that is what we want. Objective Constitutional Well-nourished and well-hydrated in no acute distress. Vitals Time Taken: 8:28 AM, Temperature: 98 F, Pulse: 74 bpm, Respiratory Rate: 17 breaths/min, Blood Pressure: 157/74 mmHg. Respiratory normal breathing without difficulty. Psychiatric this patient is able to make decisions and demonstrates good insight into disease process. Alert and Oriented x 3. pleasant and cooperative. General Notes: Upon inspection patient's wound bed actually showed signs of the need for sharp debridement of both locations I was able to remove just slough and biofilm down to good subcutaneous tissue on the great toe on the second toe there was some necrotic bone that did have to be debrided but I was able to clear all that away and it looks to be very healthy. Integumentary (Hair, Skin) Wound #1 status is Open. Original cause of wound was Trauma. The date acquired was: 12/01/2021. The wound has been in treatment 6 weeks. The wound is located on the Left T Great. The wound measures 4.4cm length x 1cm width x 0.2cm depth; 3.456cm^2 area and 0.691cm^3 volume. There is Fat Layer oe (Subcutaneous Tissue) exposed. There is no tunneling or undermining noted. There is a medium amount of serosanguineous drainage noted. The  wound margin is distinct with the outline attached to the wound base. There is medium (34-66%) red, pink granulation within the wound bed. There is a  medium (34-66%) amount of necrotic tissue within the wound bed. Wound #2 status is Open. Original cause of wound was Trauma. The date acquired was: 12/01/2021. The wound has been in treatment 6 weeks. The wound is located on the Left T Second. The wound measures 0.4cm length x 0.5cm width x 0.3cm depth; 0.157cm^2 area and 0.047cm^3 volume. There is Fat Layer oe (Subcutaneous Tissue) exposed. There is no tunneling noted, however, there is undermining starting at 12:00 and ending at 12:00 with a maximum distance of 0.3cm. There is a medium amount of serosanguineous drainage noted. The wound margin is distinct with the outline attached to the wound base. There is medium (34-66%) red, pink granulation within the wound bed. There is a medium (34-66%) amount of necrotic tissue within the wound bed. 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 There was a Excisional Skin/Subcutaneous Tissue Debridement with a total area of 4.4 sq cm performed by Lenda KelpStone III, Andranik Jeune, PA. With the following instrument(s): Curette to remove Viable and Non-Viable tissue/material. Material removed includes Subcutaneous Tissue, Slough, and Biofilm after achieving pain control using Lidocaine. No specimens were taken. A time out was conducted at 09:07, prior to the start of the procedure. A Minimum amount of bleeding was controlled with Pressure. The procedure was tolerated well with a pain level of 0 throughout and a pain level of 0 following the  procedure. Post Debridement Measurements: 4.4cm length x 1cm width x 0.2cm depth; 0.691cm^3 volume. Character of Wound/Ulcer Post Debridement is improved. Severity of Tissue Post Debridement is: Fat layer exposed. 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 There was a Excisional Skin/Subcutaneous Tissue/Muscle/Bone Debridement with a total area of 0.2 sq cm performed by Lenda KelpStone III, Cardale Dorer, PA. With the following instrument(s): Curette to remove Viable and Non-Viable tissue/material. Material removed includes Bone,Subcutaneous Tissue, and Slough after achieving pain control using Lidocaine. No specimens were taken. A time out was conducted at 09:07, prior to the start of the procedure. A Minimum amount of bleeding was controlled with Pressure. The procedure was tolerated well with a pain level of 0 throughout and a pain level of 0 following the procedure. Post Debridement Measurements: 0.4cm length x 0.5cm width x 0.3cm depth; 0.047cm^3 volume. Character of Wound/Ulcer Post Debridement is improved. Severity of Tissue Post Debridement is: Necrosis of bone. Post procedure Diagnosis Wound #2: Same as Pre-Procedure Plan Follow-up Appointments: Return Appointment in 1 week. - w/ Allen DerryHoyt Stone and [place] Rm # 9 Anesthetic: (In clinic) Topical Lidocaine 5% applied to wound bed Bathing/ Shower/ Hygiene: May shower with protection but do not get wound dressing(s) wet. Edema Control - Lymphedema / SCD / Other: Elevate legs to the level of the heart or above for 30 minutes daily and/or when sitting, a frequency of: Avoid standing for  long periods of time. Off-Loading: Other: - Keep pressure off of Left Foot/toes Consults ordered were: Podiatry - Podiatry referral to Dr. Loreta Ave for new diabetic patient needing regular diabetic  foot care/nail trimming. ***No wound care. Pt will continue coming to wound care clinic**** WOUND #1: - T Great Wound Laterality: Left oe Cleanser: Soap and Water 1 x Per Day/15 Days Discharge Instructions: May shower and wash wound with dial antibacterial soap and water prior to dressing change. Cleanser: Wound Cleanser (Generic) 1 x Per Day/15 Days Discharge Instructions: Cleanse the wound with wound cleanser prior to applying a clean dressing using gauze sponges, not tissue or cotton balls. Prim Dressing: Xeroform Occlusive Gauze Dressing, 4x4 in (Generic) 1 x Per Day/15 Days ary Discharge Instructions: Apply to wound bed as instructed Secondary Dressing: Woven Gauze Sponge, Non-Sterile 4x4 in (Generic) 1 x Per Day/15 Days Discharge Instructions: Apply over primary dressing as directed. Secured With: Insurance underwriter, Sterile 2x75 (in/in) (Generic) 1 x Per Day/15 Days Discharge Instructions: Secure with stretch gauze as directed. Secured With: 62M Medipore H Soft Cloth Surgical T ape, 4 x 10 (in/yd) (Generic) 1 x Per Day/15 Days Discharge Instructions: Secure with tape as directed. Secured With: [employer] Size 2, 10 (yds) (Generic) 1 x Per Day/15 Days WOUND #2: - T Second Wound Laterality: Left oe Cleanser: Soap and Water 1 x Per Day/15 Days Discharge Instructions: May shower and wash wound with dial antibacterial soap and water prior to dressing change. Cleanser: Wound Cleanser (Generic) 1 x Per Day/15 Days Discharge Instructions: Cleanse the wound with wound cleanser prior to applying a clean dressing using gauze sponges, not tissue or cotton balls. Prim Dressing: Promogran Prisma Matrix, 4.34 (sq in) (silver collagen) 1 x Per Day/15 Days ary Discharge Instructions: Moisten collagen and put on wound bed of 2nd toe and then xeroform on top Prim Dressing: Xeroform Occlusive Gauze Dressing, 4x4 in (Generic) 1 x Per Day/15 Days ary Discharge Instructions: Apply to wound  bed as instructed Secondary Dressing: Woven Gauze Sponge, Non-Sterile 4x4 in (Generic) 1 x Per Day/15 Days Discharge Instructions: Apply over primary dressing as directed. Secured With: Insurance underwriter, Sterile 2x75 (in/in) (Generic) 1 x Per Day/15 Days Discharge Instructions: Secure with stretch gauze as directed. Secured With: 62M Medipore H Soft Cloth Surgical T ape, 4 x 10 (in/yd) (Generic) 1 x Per Day/15 Days Discharge Instructions: Secure with tape as directed. Secured With: Stretch Net Size 2, 10 (yds) (Generic) 1 x Per Day/15 Days 1. Would recommend currently that we go ahead and continue with the recommendation for wound care measures as before and the patient is in agreement with plan this includes the use of the Xeroform from both wounds that we will put a bit of collagen in the second toe area to help with granulation. [place], [place] ([id number]) [id number] Page 9 of 9 2. Also can recommend that we have the patient continue with the dressing changes daily which I think is actually a good way to go. We will see patient back for reevaluation in 1 week here in the clinic. If anything worsens or changes patient will contact our office for additional recommendations. Electronic Signature(s) Signed: 02/12/2022 9:28:16 AM By: Lenda Kelp PA-C Previous Signature: 02/12/2022 9:27:26 AM Version By: Lenda Kelp PA-C Entered By: Lenda Kelp on 02/12/2022 09:28:16 -------------------------------------------------------------------------------- SuperBill Details Patient Name: Date of Service: Violet Baldy, A LFO RD 02/12/2022 Medical Record Number: [id number] Patient Account Number: [id number] Date of Birth/Sex: Treating  RN: [birth year] (81 y.o. 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 R73.03 Prediabetes I10 Essential (primary) hypertension Facility Procedures : CPT4 Code: 16945038 Description: 11042 - DEB SUBQ TISSUE 20 SQ CM/< ICD-10 Diagnosis Description L97.522 Non-pressure chronic ulcer of other part of left foot with fat layer exposed Modifier: Quantity: 1 : CPT4 Code: 88280034 Description: 11044 - DEB BONE 20 SQ CM/< ICD-10 Diagnosis Description L97.524 Non-pressure chronic ulcer of other part of left foot with necrosis of bone Modifier: Quantity: 1 Physician Procedures : CPT4: Description Modifier Code [id number] 11042 - WC PHYS SUBQ TISS 20 SQ CM ICD-10 Diagnosis Description L97.522 Non-pressure chronic ulcer of other part of left foot with fat layer exposed Quantity: 1 : CPT4: 5697948 Debridement; bone (includes epidermis, dermis, subQ tissue, muscle and/or fascia, if performed) 1st 20 sqcm or less ICD-10 Diagnosis Description L97.524 Non-pressure chronic ulcer of other part of left foot with necrosis of bone Quantity: 1 Electronic Signature(s) Signed: 02/12/2022 9:28:26 AM By: Lenda Kelp PA-C Entered By: Lenda Kelp on 02/12/2022 01:65:53

## 2022-02-12 NOTE — Progress Notes (Signed)
Cardiology Office Note:    Date:  02/14/2022   ID:  [name], DOB 1940/06/28, MRN [id number]  PCP:  [name], [place] Providers Cardiologist:  Quay Burow, MD     Referring MD: Mackie Pai, [place]   Chief Complaint  Patient presents with   Hospitalization Follow-up    Recent admission for chest pain    History of Present Illness:    [name] is a 81 y.o. male with a hx of CAD s/p CABG 12/27/2004 with LIMA to LAD, SVG to left circumflex marginal branch, SVG to PDA, HTN, HLD, PAD s/p recent femoral-popliteal bypass and left femoral enterectomy on 01/28/2022.  Myoview in February 2021 was negative.  Last cardiac catheterization in February 2022 revealed normal EF, left dominant system, patent SVG to distal left PDA, patent LIMA to LAD, 90% apical LAD stenosis after LIMA insertion.  He underwent vascular bypass surgery by [name] on 01/28/2022.  Patient returned back to the hospital 5 days after being released from admission for of vascular bypass surgery with chest discomfort.  Troponin was mildly elevated.  Patient underwent nuclear stress test on 02/05/2022 which came back intermediate risk with ischemia again relatively small patch of basal anterior wall and anterior septum, [name] suspected this is due to left main coronary artery stenosis feeding a relatively short segment of LAD artery was attached to septals and the diagonal with downstream occlusion of the mid LAD.  Area of myocardium was relatively small and risk outweighed the potential benefit to pursue cardiac catheterization.  Echocardiogram showed EF 50-55 % with no regional wall motion abnormality, normal RV, no significant valve disease.  Medical management was recommended.  Beta-blocker was increased.  Hemoglobin was 9.0 at the time of discharge.  Patient presents today for follow-up.  In the first 2 days after released from the hospital, he did have 2 episodes of chest discomfort which  were short-lived.  Since then, he has not had any significant chest discomfort.  He has no lower extremity edema, orthopnea or PND.  Lung exam shows diffusely decreased breath sounds.  Recent CT of the chest shows central lobar emphysematous changes.  Patient previously was a smoker who quit after his bypass surgery in 2006.  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  Overall, he appears to be stable from the cardiac perspective and can follow-up with Dr. Gwenlyn Lloyd as previously scheduled.   Past Medical History:  Diagnosis Date   Claudication ([place]) 06/27/2011   Left ABIs abnormal values with mild-moderate arterial insufficiency, right SFA moderate amount of mixed density plaque elevating velocities suggestive 50-69% diameter reduction   Coronary artery disease 06/27/2011   Normal Lexiscan, EF 64, post-stress EF 64, no EKG changes   Diabetes mellitus without complication ([place])    prediabetic   Hyperlipidemia    Hypertension    Impotence of organic origin    Osteoarthritis    Other testicular hypofunction    PVD (peripheral vascular disease) ([place])     Past Surgical History:  Procedure Laterality Date   ABDOMINAL AORTOGRAM N/A 06/28/2020   Procedure: ABDOMINAL AORTOGRAM;  Surgeon: Lorretta Harp, MD;  Location: [facility];  Service: Cardiovascular;  Laterality: N/A;   ABDOMINAL AORTOGRAM W/LOWER EXTREMITY Left 01/27/2022   Procedure: ABDOMINAL AORTOGRAM W/LOWER EXTREMITY;  Surgeon: Waynetta Sandy, MD;  Location: [facility];  Service: Cardiovascular;  Laterality: Left;   CARDIAC CATHETERIZATION  12/26/2004   Left main-90%  distal tapered stenosis, left circumflex-95% ostial stenosis, first marginal branch 99% ostial branch, posterior descending artery 99% ostial stenosis, 60-70% segmental mid stenosis, needed CABG   CORONARY ARTERY BYPASS GRAFT  12/27/2004   Left main 3-vessel disease, LIMA to LAD, saphenous vein to circumflexmarginal branch and to the  PDA   ENDARTERECTOMY FEMORAL Left 01/28/2022   Procedure: LEFT ENDARTERECTOMYCOMMON  [place];  Surgeon: Waynetta Sandy, MD;  Location: [facility];  Service: Vascular;  Laterality: Left;   FEMORAL-POPLITEAL BYPASS GRAFT Left 01/28/2022   Procedure: LEFT FEMORAL-POPLITEAL ARTERY BYPASS;  Surgeon: Waynetta Sandy, MD;  Location: [facility];  Service: Vascular;  Laterality: Left;   INGUINAL HERNIA REPAIR Left 2005   LOWER EXTREMITY ANGIOGRAPHY N/A 04/13/2017   Procedure: LOWER EXTREMITY ANGIOGRAPHY;  Surgeon: Lorretta Harp, MD;  Location: [facility];  Service: Cardiovascular;  Laterality: N/A;   LOWER EXTREMITY ANGIOGRAPHY Bilateral 12/06/2020   Procedure: Lower Extremity Angiography;  Surgeon: Lorretta Harp, MD;  Location: [facility];  Service: Cardiovascular;  Laterality: Bilateral;  Limited Study   LOWER EXTREMITY INTERVENTION N/A 08/04/2016   Procedure: Lower Extremity Intervention;  Surgeon: Lorretta Harp, MD;  Location: [facility];  Service: Cardiovascular;  Laterality: N/A;   PERIPHERAL VASCULAR ATHERECTOMY Left 04/13/2017   Procedure: PERIPHERAL VASCULAR ATHERECTOMY;  Surgeon: Lorretta Harp, MD;  Location: [facility];  Service: Cardiovascular;  Laterality: Left;  SFA   PERIPHERAL VASCULAR ATHERECTOMY  12/06/2020   Procedure: PERIPHERAL VASCULAR ATHERECTOMY;  Surgeon: Lorretta Harp, MD;  Location: [facility];  Service: Cardiovascular;;  Lt Iliac   PERIPHERAL VASCULAR BALLOON ANGIOPLASTY Left 04/13/2017   Procedure: PERIPHERAL VASCULAR BALLOON ANGIOPLASTY;  Surgeon: Lorretta Harp, MD;  Location: [facility];  Service: Cardiovascular;  Laterality: Left;  SFA   PERIPHERAL VASCULAR INTERVENTION  12/06/2020   Procedure: PERIPHERAL VASCULAR INTERVENTION;  Surgeon: Lorretta Harp, MD;  Location: [facility];  Service: Cardiovascular;;  Lt Iliac   RIGHT/LEFT HEART CATH AND CORONARY/GRAFT  ANGIOGRAPHY N/A 06/28/2020   Procedure: RIGHT/LEFT HEART CATH AND CORONARY/GRAFT ANGIOGRAPHY;  Surgeon: Lorretta Harp, MD;  Location: [facility];  Service: Cardiovascular;  Laterality: N/A;    Current Medications: Current Meds  Medication Sig   amLODipine (NORVASC) 10 MG tablet TAKE 1 TABLET BY MOUTH EVERY DAY (Patient taking differently: Take 10 mg by mouth daily.)   aspirin 81 MG tablet Take 81 mg by mouth daily.   Cholecalciferol (VITAMIN D) 50 MCG (2000 UT) tablet Take 2,000 Units by mouth daily.   clopidogrel (PLAVIX) 75 MG tablet TAKE 1 TABLET BY MOUTH EVERY DAY (Patient taking differently: Take 75 mg by mouth daily.)   Coenzyme Q10 (COQ10) 200 MG CAPS Take 200 mg by mouth daily.   famotidine (PEPCID) 20 MG tablet Take 1 tablet (20 mg total) by mouth daily.   fluticasone (FLONASE) 50 MCG/ACT nasal spray Place 2 sprays into both nostrils daily.   isosorbide mononitrate (IMDUR) 60 MG 24 hr tablet Take 1 tablet (60 mg total) by mouth daily.   levocetirizine (XYZAL) 5 MG tablet Take 1 tablet (5 mg total) by mouth every evening.   levofloxacin (LEVAQUIN) 750 MG tablet Take 750 mg by mouth daily. 30 day course.   metFORMIN (GLUCOPHAGE) 500 MG tablet Take 1 tablet (500 mg total) by mouth 2 (two) times daily with a meal.   metoprolol succinate (TOPROL-XL) 50 MG 24 hr tablet Take 1  tablet (50 mg total) by mouth 2 (two) times daily. Take with or immediately following a meal.   nitroGLYCERIN (NITROSTAT) 0.4 MG SL tablet Place 1 tablet (0.4 mg total) under the tongue every 5 (five) minutes as needed for chest pain.   Omega-3 Fatty Acids (FISH OIL) 1200 MG CAPS Take 2 capsules (2,400 mg total) by mouth every morning.   oxyCODONE-acetaminophen (PERCOCET/ROXICET) 5-325 MG tablet Take 1 tablet by mouth every 6 (six) hours as needed for severe pain.   PRALUENT 150 MG/ML SOAJ INJECT 150 MG INTO THE SKIN EVERY 14 (FOURTEEN) DAYS.   silver sulfADIAZINE (SILVADENE) 1 % cream Apply 1 Application  topically 2 (two) times daily.     Allergies:   Crestor [rosuvastatin], Lipitor [atorvastatin], and Zestril [lisinopril]   Social History   Socioeconomic History   Marital status: Married    Spouse name: cathy   Number of children: 1   Years of education: 12   Highest education level: Not on file  Occupational History   Occupation: retired  Tobacco Use   Smoking status: Former    Packs/day: 1.00    Years: 50.00    Total pack years: 50.00    Types: Cigarettes    Quit date: 12/17/2004    Years since quitting: 17.1   Smokeless tobacco: Never  Vaping Use   Vaping Use: Never used  Substance and Sexual Activity   Alcohol use: Yes    Comment: occasionally   Drug use: No   Sexual activity: Not on file  Other Topics Concern   Not on file  Social History Narrative   Lives with wife   Caffeine - coffee, 1 cup daily   Social Determinants of Health   Financial Resource Strain: Not on file  Food Insecurity: No Food Insecurity (02/04/2022)   Hunger Vital Sign    Worried About Running Out of Food in the Last Year: Never true    [place] in the Last Year: Never true  Transportation Needs: No Transportation Needs (02/04/2022)   PRAPARE - Hydrologist (Medical): No    Lack of Transportation (Non-Medical): No  Physical Activity: Not on file  Stress: Not on file  Social Connections: Not on file     Family History: The patient's family history includes Clotting disorder in his father; Diabetes in his brother; Hypertension in his mother.  ROS:   Please see the history of present illness.     All other systems reviewed and are negative.  EKGs/Labs/Other Studies Reviewed:    The following studies were reviewed today:  Echo 02/05/2022  1. Left ventricular ejection fraction, by estimation, is 50 to 55%. The  left ventricle has low normal function. The left ventricle has no regional  wall motion abnormalities. Left ventricular diastolic parameters  are  indeterminate.   2. Right ventricular systolic function is normal. The right ventricular  size is normal.   3. The mitral valve is normal in structure. No evidence of mitral valve  regurgitation. No evidence of mitral stenosis. Moderate mitral annular  calcification.   4. The aortic valve is grossly normal. There is mild calcification of the  aortic valve. There is mild thickening of the aortic valve. Aortic valve  regurgitation is not visualized. Aortic valve sclerosis/calcification is  present, without any evidence of  aortic stenosis.   Comparison(s): No prior Echocardiogram.   Conclusion(s)/Recommendation(s): Otherwise normal echocardiogram, with  minor abnormalities described in the report.   EKG:  EKG  is not ordered today.    Recent Labs: 10/21/2021: Brain Natriuretic Peptide 30 02/05/2022: ALT 14; Hemoglobin 9.0; Magnesium 1.7; Platelets 242; TSH 2.213 02/06/2022: BUN 12; Creatinine, Ser 0.80; Potassium 4.0; Sodium 140  Recent Lipid Panel    Component Value Date/Time   CHOL 71 02/05/2022 0211   CHOL 72 (L) 10/09/2020 0906   TRIG 83 02/05/2022 0211   HDL 34 (L) 02/05/2022 0211   HDL 42 10/09/2020 0906   CHOLHDL 2.1 02/05/2022 0211   VLDL 17 02/05/2022 0211   LDLCALC 20 02/05/2022 0211   LDLCALC 15 10/09/2020 0906     Risk Assessment/Calculations:           Physical Exam:    VS:  BP (!) 120/58   Pulse 68   Ht 6\' 1"  (1.854 m)   Wt 220 lb 3.2 oz (99.9 kg)   SpO2 92%   BMI 29.05 kg/m        Wt Readings from Last 3 Encounters:  02/12/22 219 lb (99.3 kg)  02/12/22 220 lb 3.2 oz (99.9 kg)  02/04/22 214 lb 11.7 oz (97.4 kg)     GEN:  Well nourished, well developed in no acute distress HEENT: Normal NECK: No JVD; No carotid bruits LYMPHATICS: No lymphadenopathy CARDIAC: RRR, no murmurs, rubs, gallops RESPIRATORY:  Clear to auscultation without rales, wheezing or rhonchi  ABDOMEN: Soft, non-tender, non-distended MUSCULOSKELETAL:  No edema; No  deformity  SKIN: Warm and dry NEUROLOGIC:  Alert and oriented x 3 PSYCHIATRIC:  Normal affect   ASSESSMENT:    1. Coronary artery disease involving coronary bypass graft of native heart without angina pectoris   2. Primary hypertension   3. Hyperlipidemia LDL goal <70   4. PAD (peripheral artery disease) (HCC)    PLAN:    In order of problems listed above:  Chest pain: Recently admitted to the hospital with chest discomfort.  Her troponin was mildly elevated.  Myoview showed small patch of basal anterior wall and anterior septum ischemia, [name] suspect this is due to left main coronary artery stenosis feeding into a relatively small segment of LAD artery before occlusion mid LAD, given the small size of ischemia, medical management was recommended.  Since discharge, patient had a 2 episode of chest pain in the initial 2 days, however has no chest pain since  Hypertension: Blood pressure stable  Hyperlipidemia: On Praluent  PAD: No significant claudication.           Medication Adjustments/Labs and Tests Ordered: Current medicines are reviewed at length with the patient today.  Concerns regarding medicines are outlined above.  No orders of the defined types were placed in this encounter.  No orders of the defined types were placed in this encounter.   Patient Instructions  Medication Instructions:  Your physician recommends that you continue on your current medications as directed. Please refer to the Current Medication list given to you today.  *If you need a refill on your cardiac medications before your next appointment, please call your pharmacy*   Lab Work: NONE If you have labs (blood work) drawn today and your tests are completely normal, you will receive your results only by: [place] (if you have MyChart) OR A paper copy in the mail If you have any lab test that is abnormal or we need to change your treatment, we will call you to review the  results.   Follow-Up: At [facility], you and your health needs are our priority.  As  part of our continuing mission to provide you with exceptional heart care, we have created designated Provider Care Teams.  These Care Teams include your primary Cardiologist (physician) and Advanced Practice Providers (APPs -  Physician Assistants and Nurse Practitioners) who all work together to provide you with the care you need, when you need it.  We recommend signing up for the patient portal called "MyChart".  Sign up information is provided on this After Visit Summary.  MyChart is used to connect with patients for Virtual Visits (Telemedicine).  Patients are able to view lab/test results, encounter notes, upcoming appointments, etc.  Non-urgent messages can be sent to your provider as well.   To learn more about what you can do with MyChart, go to [email or web address].    Your next appointment:   04/04/2022 AT 08:00 AM with Dr. Gwenlyn Lloyd     Signed, Almyra Deforest, [place]  02/14/2022 11:40 PM    [place]

## 2022-02-12 NOTE — Progress Notes (Signed)
POST OPERATIVE OFFICE NOTE    CC:  F/u for surgery  HPI:  This is a 81 y.o. male who is s/p Left common femoral to below-knee popliteal artery bypass with 6 mm ringed PTFE on 01/28/22 by Dr. 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    He is followed by the wound care center for burn care of the toes.  His wife states they were just visited the wound center prior to this visit and the toes are healing well.  He has developed clear fluid drainage from the superior incision left groin over the last 2 weeks.  He denies fever, chills or purulent drainage.  He has been more active and has increased edema in the left LE compared to the right.    Pt returns today for follow up and incision check for healing.   Pt states he is slowly increasing his mobility.  He has used elevation in a recliner daily, but still have edema.  He denies rest pain, claudication or new wounds.    He is medically managed on ASA.  He has an  allergy to Statins.     Allergies  Allergen Reactions   Crestor [Rosuvastatin] Other (See Comments)    Myalgias   Lipitor [Atorvastatin] Other (See Comments)    Myalgias    Zestril [Lisinopril] Cough    Current Outpatient Medications  Medication Sig Dispense Refill   amLODipine (NORVASC) 10 MG tablet TAKE 1 TABLET BY MOUTH EVERY DAY (Patient taking differently: Take 10 mg by mouth daily.) 90 tablet 3   aspirin 81 MG tablet Take 81 mg by mouth daily.     Cholecalciferol (VITAMIN D) 50 MCG (2000 UT) tablet Take 2,000 Units by mouth daily.     clopidogrel (PLAVIX) 75 MG tablet TAKE 1 TABLET BY MOUTH EVERY DAY (Patient taking differently: Take 75 mg by mouth daily.) 90 tablet 3   Coenzyme Q10 (COQ10) 200 MG CAPS Take 200 mg by mouth daily.     famotidine (PEPCID) 20 MG tablet Take 1 tablet (20 mg total) by mouth daily. 90 tablet 1   fluticasone (FLONASE) 50 MCG/ACT nasal spray Place 2 sprays into both nostrils daily. 16 g 1    isosorbide mononitrate (IMDUR) 60 MG 24 hr tablet Take 1 tablet (60 mg total) by mouth daily. 90 tablet 3   levocetirizine (XYZAL) 5 MG tablet Take 1 tablet (5 mg total) by mouth every evening. 90 tablet 1   levofloxacin (LEVAQUIN) 750 MG tablet Take 750 mg by mouth daily. 30 day course.     metFORMIN (GLUCOPHAGE) 500 MG tablet Take 1 tablet (500 mg total) by mouth 2 (two) times daily with a meal. 180 tablet 3   metoprolol succinate (TOPROL-XL) 50 MG 24 hr tablet Take 1 tablet (50 mg total) by mouth 2 (two) times daily. Take with or immediately following a meal. 30 tablet 0   nitroGLYCERIN (NITROSTAT) 0.4 MG SL tablet Place 1 tablet (0.4 mg total) under the tongue every 5 (five) minutes as needed for chest pain. 25 tablet 3   Omega-3 Fatty Acids (FISH OIL) 1200 MG CAPS Take 2 capsules (2,400 mg total) by mouth every morning.     oxyCODONE-acetaminophen (PERCOCET/ROXICET) 5-325 MG tablet Take 1 tablet by mouth every 6 (six) hours as needed for severe pain. 15 tablet 0   PRALUENT 150 MG/ML SOAJ INJECT 150 MG INTO THE SKIN EVERY 14 (FOURTEEN) DAYS. 2 mL 11   silver sulfADIAZINE (SILVADENE)  1 % cream Apply 1 Application topically 2 (two) times daily. 50 g 0   No current facility-administered medications for this visit.     ROS:  See HPI  Physical Exam:       Incision:  small superior pin point clear fluid drainage.  No groin erythema or edema. Leg incision are healing well. Extremities:  Brisk doppler DP/AT/Peroneal on the left LE, right intact PT doppler     Assessment/Plan:  This is a 81 y.o. male who is s/p:Left common femoral to below-knee popliteal artery bypass with 6 mm ringed PTFE on 01/28/22 by Dr. 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    The left groin has lymphocele drainage without signs of infection.  Continue dry guaze as needed.  F/U wound care center for toe checks.  I gave him a hand out for proper elevation.  He will try  this multiple times a day.  If he has continued edema he can be placed in mild compression.  He will f/u in 1 week for left groin check.  If he develops fever, chills or purulent.     Roxy Horseman PA-C [facility] [phone]   Clinic MD:  Donzetta Matters

## 2022-02-12 NOTE — Patient Instructions (Signed)
Medication Instructions:  Your physician recommends that you continue on your current medications as directed. Please refer to the Current Medication list given to you today.  *If you need a refill on your cardiac medications before your next appointment, please call your pharmacy*   Lab Work: NONE If you have labs (blood work) drawn today and your tests are completely normal, you will receive your results only by: [place] (if you have MyChart) OR A paper copy in the mail If you have any lab test that is abnormal or we need to change your treatment, we will call you to review the results.   Follow-Up: At [facility], you and your health needs are our priority.  As part of our continuing mission to provide you with exceptional heart care, we have created designated Provider Care Teams.  These Care Teams include your primary Cardiologist (physician) and Advanced Practice Providers (APPs -  Physician Assistants and Nurse Practitioners) who all work together to provide you with the care you need, when you need it.  We recommend signing up for the patient portal called "MyChart".  Sign up information is provided on this After Visit Summary.  MyChart is used to connect with patients for Virtual Visits (Telemedicine).  Patients are able to view lab/test results, encounter notes, upcoming appointments, etc.  Non-urgent messages can be sent to your provider as well.   To learn more about what you can do with MyChart, go to [email or web address].    Your next appointment:   04/04/2022 AT 08:00 AM with Dr. Gwenlyn Found

## 2022-02-13 NOTE — Progress Notes (Signed)
[name], [name] ([id number]) Visit Report for 02/12/2022 Arrival Information Details Patient Name: Date of Service: [name] 02/12/2022 8:15 [name] Medical Record Number: [id number] Patient Account Number: [id number] Date of Birth/Sex: Treating RN: 08-29-1940 (81 y.o. [name], [name] Primary Care [name]: [name] Other Clinician: Referring [name]: Treating [name]/Extender: [name] in Treatment: 6 Visit Information History Since Last Visit Added or deleted any medications: No Patient Arrived: Ambulatory Any new allergies or adverse reactions: No Arrival Time: 08:33 Had Richard fall or experienced change in No Accompanied By: wife activities of daily living that may affect Transfer Assistance: None risk of falls: Patient Identification Verified: Yes Signs or symptoms of abuse/neglect since last visito No Secondary Verification Process Completed: Yes Hospitalized since last visit: No Patient Requires Transmission-Based Precautions: No Implantable device outside of the clinic excluding No Patient Has Alerts: Yes cellular tissue based products placed in the center Patient Alerts: ABI's:02/23 R: N/C L:0.6 since last visit: TBI's:02/23 R:0.62L:0.26 Has Dressing in Place as Prescribed: Yes Pain Present Now: No Electronic Signature(s) Signed: 02/13/2022 4:19:15 PM By: Rhae Hammock RN Entered By: Rhae Hammock on 02/12/2022 08:34:00 -------------------------------------------------------------------------------- Encounter Discharge Information Details Patient Name: Date of Service: [name], [name] 02/12/2022 8:15 [name] Medical Record Number: [id number] Patient Account Number: [id number] Date of Birth/Sex: Treating RN: 03-Sep-1940 (81 y.o. [name], [name] Primary Care [name]: [name] Other Clinician: Referring [name]: Treating [name]/Extender: [name] in Treatment: 6 Encounter Discharge  Information Items Post Procedure Vitals Discharge Condition: Stable Temperature (F): 98.1 Ambulatory Status: Ambulatory Pulse (bpm): 74 Discharge Destination: Home Respiratory Rate (breaths/min): 17 Transportation: Private Auto Blood Pressure (mmHg): 137/83 Accompanied By: self Schedule Follow-up Appointment: Yes Clinical Summary of Care: Patient Declined Electronic Signature(s) Signed: 02/13/2022 4:19:15 PM By: Rhae Hammock RN Entered By: Rhae Hammock on 02/12/2022 09:11:14 -------------------------------------------------------------------------------- Lower Extremity Assessment Details Patient Name: Date of Service: [name], [name] 02/12/2022 8:15 [name] Medical Record Number: [id number] Patient Account Number: [id number] Date of Birth/Sex: Treating RN: February 10, 1941 (81 y.o. [name], [name] Primary Care [name]: [name] Other Clinician: Referring [name]: Treating [name]/Extender: [name] in Treatment: 6 Edema Assessment Assessed: Shirlyn Goltz: Yes] Patrice Paradise: No] Edema: [Left: Ye] [Right: s] Calf Left: Right: Point of Measurement: 37 cm From Medial Instep 36.5 cm Ankle Left: Right: Point of Measurement: 10 cm From Medial Instep 24 cm Vascular Assessment Pulses: Dorsalis Pedis Palpable: [Left:Yes] Posterior Tibial Palpable: [Left:Yes] Electronic Signature(s) Signed: 02/13/2022 4:19:15 PM By: Rhae Hammock RN Entered By: Rhae Hammock on 02/12/2022 08:33:40 -------------------------------------------------------------------------------- Multi-Disciplinary Care Plan Details Patient Name: Date of Service: [name], [name] 02/12/2022 8:15 [name] Medical Record Number: [id number] Patient Account Number: [id number] Date of Birth/Sex: Treating RN: 1940-08-21 (81 y.o. [name] Primary Care [name]: [name] Other Clinician: Referring [name]: Treating [name]/Extender: [name] in Treatment: 6 Active Inactive Tissue Oxygenation Nursing Diagnoses: Actual ineffective tissue perfusion; peripheral (select once diagnosis is confirmed) Knowledge deficit related to disease process and management Potential alteration in peripheral tissue perfusion (select prior to confirmation of diagnosis) Goals: Invasive arterial studies completed as ordered Date Initiated: 01/01/2022 Target Resolution Date: 03/01/2022 Goal Status: Active Non-invasive arterial studies are completed as ordered Date Initiated: 01/01/2022 Target Resolution Date: 02/22/2022 Goal Status: Active Patient/caregiver will verbalize understanding of disease process and disease management Date Initiated: 01/01/2022 Target Resolution Date: 02/22/2022 Goal Status: Active Interventions: Assess  patient understanding of disease process and management upon diagnosis and as needed Assess peripheral arterial status upon admission and as needed Provide education on tissue oxygenation and ischemia Notes: Wound/Skin Impairment Nursing Diagnoses: Impaired tissue integrity Knowledge deficit related to ulceration/compromised skin integrity Goals: Patient will have Richard decrease in wound volume by X% from date: (specify in notes) Date Initiated: 01/01/2022 Target Resolution Date: 02/22/2022 Goal Status: Active Patient/caregiver will verbalize understanding of skin care regimen Date Initiated: 01/01/2022 Target Resolution Date: 02/22/2022 Goal Status: Active Ulcer/skin breakdown will have Richard volume reduction of 30% by week 4 Date Initiated: 01/01/2022 Target Resolution Date: 02/22/2022 Goal Status: Active Interventions: Assess patient/caregiver ability to obtain necessary supplies Assess patient/caregiver ability to perform ulcer/skin care regimen upon admission and as needed Assess ulceration(s) every visit Notes: Electronic Signature(s) Signed: 02/13/2022 4:19:15 PM By: [name] RN Entered By:  [name] on 02/12/2022 08:54:01 -------------------------------------------------------------------------------- Pain Assessment Details Patient Name: Date of Service: [name], [name] 02/12/2022 8:15 [name] Medical Record Number: [id number] Patient Account Number: [id number] Date of Birth/Sex: Treating RN: [birth year] (81 y.o. [name] Primary Care Tomaz Janis: [name] Other Clinician: Referring Janiece Scovill: Treating Jameia Makris/Extender: [name] in Treatment: 6 Active Problems Location of Pain Severity and Description of Pain Patient Has Paino No Site Locations Pain Management and Medication Current Pain Management: Electronic Signature(s) Signed: 02/13/2022 4:19:15 PM By: [name] RN Entered By: [name] on 02/12/2022 08:28:39 -------------------------------------------------------------------------------- Patient/Caregiver Education Details Patient Name: Date of Service: [name], [name] 9/27/2023andnbsp8:15 [name] Medical Record Number: [id number] Patient Account Number: [id number] Date of Birth/Gender: Treating RN: [birth year] (80 y.o. [name] Primary Care Physician: [name] Other Clinician: Referring Physician: Treating Physician/Extender: [name] in Treatment: 6 Education Assessment Education Provided To: Patient Education Topics Provided Tissue Oxygenation: Methods: Explain/Verbal Responses: Reinforcements needed, State content correctly Electronic Signature(s) Signed: 02/13/2022 4:19:15 PM By: [name] RN Entered By: [name] on 02/12/2022 08:54:10 -------------------------------------------------------------------------------- Wound Assessment Details Patient Name: Date of Service: [name], [name] 02/12/2022 8:15 [name] Medical Record Number: [id number] Patient Account Number: [id number] Date of Birth/Sex: Treating RN: 1940/08/03  (81 y.o. [name] Primary Care Tresia Revolorio: [name] Other Clinician: Referring Querida Beretta: Treating Hanley Rispoli/Extender: [name] in Treatment: 6 Wound Status Wound Number: 1 Primary Arterial Insufficiency Ulcer Etiology: Wound Location: Left T Great oe Wound Open Wounding Event: Trauma Status: Date Acquired: 12/01/2021 Comorbid Coronary Artery Disease, Hypotension, Peripheral Arterial Weeks Of Treatment: 6 History: Disease, Peripheral Venous Disease, Type II Diabetes, Clustered Wound: No Osteoarthritis Photos Wound Measurements Length: (cm) 4.4 Width: (cm) 1 Depth: (cm) 0.2 Area: (cm) 3.456 Volume: (cm) 0.691 % Reduction in Area: 64.8% % Reduction in Volume: 64.8% Epithelialization: Medium (34-66%) Tunneling: No Undermining: No Wound Description Classification: Full Thickness With Exposed Support Structures Wound Margin: Distinct, outline attached Exudate Amount: Medium Exudate Type: Serosanguineous Exudate Color: red, brown Foul Odor After Cleansing: No Slough/Fibrino Yes Wound Bed Granulation Amount: Medium (34-66%) Exposed Structure Granulation Quality: Red, Pink Fascia Exposed: No Necrotic Amount: Medium (34-66%) Fat Layer (Subcutaneous Tissue) Exposed: Yes Tendon Exposed: No Muscle Exposed: No Joint Exposed: No Bone Exposed: No Treatment Notes Wound #1 (Toe Great) Wound Laterality: Left Cleanser Soap and Water Discharge Instruction: May shower and wash wound with dial antibacterial soap and water prior to dressing change. Wound Cleanser Discharge Instruction: Cleanse the wound with wound cleanser prior to applying Richard clean dressing  using gauze sponges, not tissue or cotton balls. Peri-Wound Care Topical Primary Dressing Xeroform Occlusive Gauze Dressing, 4x4 in Discharge Instruction: Apply to wound bed as instructed Secondary Dressing Woven Gauze Sponge, Non-Sterile 4x4 in Discharge Instruction: Apply  over primary dressing as directed. Secured With Conforming Stretch Gauze Bandage, Sterile 2x75 (in/in) Discharge Instruction: Secure with stretch gauze as directed. 82M Medipore H Soft Cloth Surgical T ape, 4 x 10 (in/yd) Discharge Instruction: Secure with tape as directed. Stretch Net Size 2, 10 (yds) Compression Wrap Compression Stockings Add-Ons Electronic Signature(s) Signed: 02/12/2022 1:33:45 PM By: Shawn Stall RN, BSN Signed: 02/13/2022 4:19:15 PM By: [name] RN Entered By: Shawn Stall on 02/12/2022 08:45:23 -------------------------------------------------------------------------------- Wound Assessment Details Patient Name: Date of Service: [name], [name] 02/12/2022 8:15 [name] Medical Record Number: [id number] Patient Account Number: [id number] Date of Birth/Sex: Treating RN: Feb 22, 1941 (81 y.o. [name], [name] Primary Care Demeisha Geraghty: [name] Other Clinician: Referring Imanuel Pruiett: Treating Masaji Billups/Extender: [name] in Treatment: 6 Wound Status Wound Number: 2 Primary Arterial Insufficiency Ulcer Etiology: Wound Location: Left T Second oe Wound Open Wounding Event: Trauma Status: Date Acquired: 12/01/2021 Comorbid Coronary Artery Disease, Hypotension, Peripheral Arterial Weeks Of Treatment: 6 History: Disease, Peripheral Venous Disease, Type II Diabetes, Clustered Wound: No Osteoarthritis Photos Wound Measurements Length: (cm) 0.4 Width: (cm) 0.5 Depth: (cm) 0.3 Area: (cm) 0.157 Volume: (cm) 0.047 % Reduction in Area: 60.1% % Reduction in Volume: -20.5% Epithelialization: None Tunneling: No Undermining: Yes Starting Position (o'clock): 12 Ending Position (o'clock): 12 Maximum Distance: (cm) 0.3 Wound Description Classification: Full Thickness With Exposed Support Structures Wound Margin: Distinct, outline attached Exudate Amount: Medium Exudate Type: Serosanguineous Exudate Color: red, brown Foul  Odor After Cleansing: No Slough/Fibrino Yes Wound Bed Granulation Amount: Medium (34-66%) Exposed Structure Granulation Quality: Red, Pink Fascia Exposed: No Necrotic Amount: Medium (34-66%) Fat Layer (Subcutaneous Tissue) Exposed: Yes Tendon Exposed: No Muscle Exposed: No Joint Exposed: No Bone Exposed: No Treatment Notes Wound #2 (Toe Second) Wound Laterality: Left Cleanser Soap and Water Discharge Instruction: May shower and wash wound with dial antibacterial soap and water prior to dressing change. Wound Cleanser Discharge Instruction: Cleanse the wound with wound cleanser prior to applying Richard clean dressing using gauze sponges, not tissue or cotton balls. Peri-Wound Care Topical Primary Dressing Xeroform Occlusive Gauze Dressing, 4x4 in Discharge Instruction: Apply to wound bed as instructed Secondary Dressing Woven Gauze Sponge, Non-Sterile 4x4 in Discharge Instruction: Apply over primary dressing as directed. Secured With Conforming Stretch Gauze Bandage, Sterile 2x75 (in/in) Discharge Instruction: Secure with stretch gauze as directed. 82M Medipore H Soft Cloth Surgical T ape, 4 x 10 (in/yd) Discharge Instruction: Secure with tape as directed. Stretch Net Size 2, 10 (yds) Compression Wrap Compression Stockings Add-Ons Electronic Signature(s) Signed: 02/12/2022 1:33:45 PM By: Shawn Stall RN, BSN Signed: 02/13/2022 4:19:15 PM By: [name] RN Entered By: Shawn Stall on 02/12/2022 08:45:41 -------------------------------------------------------------------------------- Vitals Details Patient Name: Date of Service: [name], [name] 02/12/2022 8:15 [name] Medical Record Number: [id number] Patient Account Number: [id number] Date of Birth/Sex: Treating RN: January 17, 1941 (80 y.o. [name] Primary Care Macario Shear: [name] Other Clinician: Referring Elfrida Pixley: Treating Zarra Geffert/Extender: [name] in Treatment: 6 Vital  Signs Time Taken: 08:28 Temperature (F): 98 Pulse (bpm): 74 Respiratory Rate (breaths/min): 17 Blood Pressure (mmHg): 157/74 Reference Range: 80 - 120 mg / dl Electronic Signature(s) Signed: 02/13/2022 4:19:15 PM By: [name] RN Entered By: [name] on  02/12/2022 08:28:34 

## 2022-02-14 ENCOUNTER — Encounter: Payer: Self-pay | Admitting: Physician Assistant

## 2022-02-17 ENCOUNTER — Ambulatory Visit: Payer: Medicare HMO | Admitting: Podiatry

## 2022-02-17 ENCOUNTER — Telehealth: Payer: Self-pay | Admitting: Cardiovascular Disease

## 2022-02-17 DIAGNOSIS — B351 Tinea unguium: Secondary | ICD-10-CM | POA: Diagnosis not present

## 2022-02-17 DIAGNOSIS — M79675 Pain in left toe(s): Secondary | ICD-10-CM

## 2022-02-17 DIAGNOSIS — E1142 Type 2 diabetes mellitus with diabetic polyneuropathy: Secondary | ICD-10-CM

## 2022-02-17 DIAGNOSIS — M79674 Pain in right toe(s): Secondary | ICD-10-CM | POA: Diagnosis not present

## 2022-02-17 MED ORDER — METOPROLOL SUCCINATE ER 50 MG PO TB24: 50.0000 mg | ORAL_TABLET | Freq: Two times a day (BID) | ORAL | 6 refills | Status: DC

## 2022-02-17 NOTE — Progress Notes (Addendum)
  Subjective:  Patient ID: [name], male    DOB: [birth year],  MRN: [id number]  Chief Complaint  Patient presents with   Nail Problem    Diabetic foot care/nail trim     81 y.o. male presents with the above complaint. History confirmed with patient.  Patient presents with a history of diabetes type 2 with peripheral neuropathy.  Has painful thickened elongated nails x5 both feet.  He is unable to trim them himself.  Coming in for nail trim.  Also has some areas of callus that bother him from time to time.  The patient is also being treated for some wounds on the left first and second toe at the wound care center in [place]. They are wrapped and therefore will defer wound care management on those toes to the wound care center.  Objective:  Physical Exam: warm, good capillary refill, nail exam onychomycosis of the toenails, onycholysis, and dystrophic nails,.  Left second toe and hallux are wrapped in dressings related to some wounds he has being managed by the wound care center in [place].  DP pulses palpable, PT pulses palpable, and protective sensation absent Left Foot: normal exam, no swelling, tenderness, instability; ligaments intact, full range of motion of all ankle/foot joints  Right Foot: normal exam, no swelling, tenderness, instability; ligaments intact, full range of motion of all ankle/foot joints   No images are attached to the encounter.  Assessment:   1. Pain due to onychomycosis of toenails of both feet   2. Diabetic polyneuropathy associated with type 2 diabetes mellitus ([place])      Plan:  Patient was evaluated and treated and all questions answered.  #Diabetes type 2 with peripheral neuropathy Patient educated on diabetes. Discussed proper diabetic foot care and discussed risks and complications of disease. Educated patient in depth on reasons to return to the office immediately should he/she discover anything concerning or new on the feet. All questions answered.  Discussed proper shoes as well.   Onychomycosis with pain  -Nails palliatively debrided as below. -Educated on self-care  Procedure: Nail Debridement Rationale: Pain Type of Debridement: manual, sharp debridement. Instrumentation: Nail nipper, rotary burr. Number of Nails: 8  Return in about 3 months (around 05/20/2022) for [facility].         Everitt Amber, DPM [facility]

## 2022-02-17 NOTE — Telephone Encounter (Signed)
*  STAT* If patient is at the pharmacy, call can be transferred to refill team.   1. Which medications need to be refilled? (please list name of each medication and dose if known)  metoprolol succinate (TOPROL-XL) 50 MG 24 hr tablet  2. Which pharmacy/location (including street and city if local pharmacy) is medication to be sent to? [id number] - [place], [place] - [address]   3. Do they need a 30 day or 90 day supply?  90 day supply

## 2022-02-18 ENCOUNTER — Telehealth: Payer: Self-pay

## 2022-02-18 ENCOUNTER — Ambulatory Visit (INDEPENDENT_AMBULATORY_CARE_PROVIDER_SITE_OTHER): Payer: Medicare HMO | Admitting: Medical

## 2022-02-18 ENCOUNTER — Encounter: Payer: Self-pay | Admitting: Medical

## 2022-02-18 VITALS — BP 110/60 | HR 65 | Temp 98.4°F | Resp 18 | Ht 73.0 in | Wt 217.0 lb

## 2022-02-18 DIAGNOSIS — R42 Dizziness and giddiness: Secondary | ICD-10-CM | POA: Diagnosis not present

## 2022-02-18 DIAGNOSIS — I10 Essential (primary) hypertension: Secondary | ICD-10-CM | POA: Diagnosis not present

## 2022-02-18 DIAGNOSIS — I739 Peripheral vascular disease, unspecified: Secondary | ICD-10-CM | POA: Diagnosis not present

## 2022-02-18 DIAGNOSIS — Z951 Presence of aortocoronary bypass graft: Secondary | ICD-10-CM

## 2022-02-18 LAB — CBC WITH DIFFERENTIAL/PLATELET
Basophils Absolute: 0.1 10*3/uL (ref 0.0–0.1)
Basophils Relative: 0.8 % (ref 0.0–3.0)
Eosinophils Absolute: 0.2 10*3/uL (ref 0.0–0.7)
Eosinophils Relative: 1.8 % (ref 0.0–5.0)
HCT: 34.2 % — ABNORMAL LOW (ref 39.0–52.0)
Hemoglobin: 11.2 g/dL — ABNORMAL LOW (ref 13.0–17.0)
Lymphocytes Relative: 19.7 % (ref 12.0–46.0)
Lymphs Abs: 2 10*3/uL (ref 0.7–4.0)
MCHC: 32.7 g/dL (ref 30.0–36.0)
MCV: 87.5 fl (ref 78.0–100.0)
Monocytes Absolute: 0.8 10*3/uL (ref 0.1–1.0)
Monocytes Relative: 7.7 % (ref 3.0–12.0)
Neutro Abs: 7 10*3/uL (ref 1.4–7.7)
Neutrophils Relative %: 70 % (ref 43.0–77.0)
Platelets: 295 10*3/uL (ref 150.0–400.0)
RBC: 3.9 Mil/uL — ABNORMAL LOW (ref 4.22–5.81)
RDW: 14.4 % (ref 11.5–15.5)
WBC: 9.9 10*3/uL (ref 4.0–10.5)

## 2022-02-18 LAB — COMPREHENSIVE METABOLIC PANEL
ALT: 14 U/L (ref 0–53)
AST: 21 U/L (ref 0–37)
Albumin: 4.1 g/dL (ref 3.5–5.2)
Alkaline Phosphatase: 69 U/L (ref 39–117)
BUN: 12 mg/dL (ref 6–23)
CO2: 25 mEq/L (ref 19–32)
Calcium: 9.3 mg/dL (ref 8.4–10.5)
Chloride: 106 mEq/L (ref 96–112)
Creatinine, Ser: 0.74 mg/dL (ref 0.40–1.50)
GFR: 85.96 mL/min (ref >60.00)
Glucose, Bld: 126 mg/dL — ABNORMAL HIGH (ref 70–99)
Potassium: 4.1 mEq/L (ref 3.5–5.1)
Sodium: 141 mEq/L (ref 135–145)
Total Bilirubin: 1 mg/dL (ref 0.2–1.2)
Total Protein: 6.6 g/dL (ref 6.0–8.3)

## 2022-02-18 MED ORDER — MECLIZINE HCL 12.5 MG PO TABS: 12.5000 mg | ORAL_TABLET | Freq: Three times a day (TID) | ORAL | 0 refills | Status: DC | PRN

## 2022-02-18 NOTE — Patient Instructions (Addendum)
Recent brief episodes of dizziness yesterday twice.  Both events lasting about 15 to 20 minutes.  No motor or sensory deficits described.  Normal neurologic exam today.  Blood pressure in the good but lower and range.  If you have recurrent dizzy events I want you to check your blood pressure, pulse and sugar levels.  If blood pressures trending downward less than 110 that you might need adjustment of medications affect blood pressure.  Sugar levels over 200 or less than 70 may be a factor in dizziness as well.  Making limited number of meclizine available to use for dizziness lasting more than 10 minutes.  If any dizziness with any motor or sensory deficits then be seen in the emergency department.  Also changing positions from standing to sitting might cause a brief transient dizziness.  I did get balance before ambulating/walking.   History of CABG.  Recent cardiology note reviewed.  Hypertension-blood pressure tightly controlled today.  Chronic wound with peripheral artery disease.  Continue to follow vascular surgeon notes and the wound care notes when sent to me.  We will get CBC and metabolic panel as part of work-up for dizziness.  Follow-up in 3 weeks or sooner if needed.

## 2022-02-18 NOTE — Progress Notes (Unsigned)
POST OPERATIVE OFFICE NOTE    CC:  F/u for surgery  HPI:  This is a 81 y.o. male who is s/p :Left common femoral to below-knee popliteal artery bypass with 6 mm ringed PTFE on 01/28/22 by Dr. 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  He was seen on 02/12/2022 and at that time, he had a lymphocele drainage without signs of infection and advised to continue dry gauze to left groin.  He was also instructed on leg elevation.   He was scheduled for one week follow up and comes in today for that check.   The wound care center is following his toe wounds.    Pt returns today for follow up.  Pt states the drainage in the left groin has not been present in a couple of days.  Says it stopped yesterday.  He states that his toes on the left foot are significantly improved and just left the wound care center this morning.  He states he is doing well and has been elevating his legs.  The left leg is still swelling.    He is compliant with his statin/asa.     Allergies  Allergen Reactions   Crestor [Rosuvastatin] Other (See Comments)    Myalgias   Lipitor [Atorvastatin] Other (See Comments)    Myalgias    Zestril [Lisinopril] Cough    Current Outpatient Medications  Medication Sig Dispense Refill   amLODipine (NORVASC) 10 MG tablet TAKE 1 TABLET BY MOUTH EVERY DAY (Patient taking differently: Take 10 mg by mouth daily.) 90 tablet 3   aspirin 81 MG tablet Take 81 mg by mouth daily.     Cholecalciferol (VITAMIN D) 50 MCG (2000 UT) tablet Take 2,000 Units by mouth daily.     clopidogrel (PLAVIX) 75 MG tablet TAKE 1 TABLET BY MOUTH EVERY DAY (Patient taking differently: Take 75 mg by mouth daily.) 90 tablet 3   Coenzyme Q10 (COQ10) 200 MG CAPS Take 200 mg by mouth daily.     famotidine (PEPCID) 20 MG tablet Take 1 tablet (20 mg total) by mouth daily. 90 tablet 1   fluticasone (FLONASE) 50 MCG/ACT nasal spray Place 2 sprays into both nostrils daily. 16 g  1   isosorbide mononitrate (IMDUR) 60 MG 24 hr tablet Take 1 tablet (60 mg total) by mouth daily. 90 tablet 3   levocetirizine (XYZAL) 5 MG tablet Take 1 tablet (5 mg total) by mouth every evening. 90 tablet 1   levofloxacin (LEVAQUIN) 750 MG tablet Take 750 mg by mouth daily. 30 day course.     meclizine (ANTIVERT) 12.5 MG tablet Take 1 tablet (12.5 mg total) by mouth 3 (three) times daily as needed for dizziness. 8 tablet 0   metFORMIN (GLUCOPHAGE) 500 MG tablet Take 1 tablet (500 mg total) by mouth 2 (two) times daily with a meal. 180 tablet 3   metoprolol succinate (TOPROL-XL) 50 MG 24 hr tablet Take 1 tablet (50 mg total) by mouth 2 (two) times daily. Take with or immediately following a meal. 30 tablet 6   nitroGLYCERIN (NITROSTAT) 0.4 MG SL tablet Place 1 tablet (0.4 mg total) under the tongue every 5 (five) minutes as needed for chest pain. 25 tablet 3   Omega-3 Fatty Acids (FISH OIL) 1200 MG CAPS Take 2 capsules (2,400 mg total) by mouth every morning.     oxyCODONE-acetaminophen (PERCOCET/ROXICET) 5-325 MG tablet Take 1 tablet by mouth every 6 (six) hours as needed for severe  pain. 15 tablet 0   PRALUENT 150 MG/ML SOAJ INJECT 150 MG INTO THE SKIN EVERY 14 (FOURTEEN) DAYS. 2 mL 11   silver sulfADIAZINE (SILVADENE) 1 % cream Apply 1 Application topically 2 (two) times daily. 50 g 0   No current facility-administered medications for this visit.     ROS:  See HPI  Physical Exam:  Today's Vitals   02/19/22 1012  BP: 113/68  Pulse: 86  Temp: 97.8 F (36.6 C)  TempSrc: Temporal  SpO2: 97%  Weight: 218 lb (98.9 kg)  Height: 6\' 1"  (1.854 m)   Body mass index is 28.76 kg/m.   Incision:  left groin healing nicely and unable to express any drainage from the wound.  There is no erythema present.  Lower leg incisions have healed nicely.  Extremities:  very brisk doppler flow left DP/PT > pero; + swelling LLE; left foot is warm.     Assessment/Plan:  This is a 81 y.o. male who is  s/p: :Left common femoral to below-knee popliteal artery bypass with 6 mm ringed PTFE on 01/28/22 by Dr. 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   -pt with brisk doppler flow left foot -per pt, toe wounds are healing.  Dressing not removed as they were just placed at the wound center.  -left groin drainage appears to be resolved.  Discussed with pt to continue to wash with soap and water and then keep dry otherwise.  He will f/u in 2 months with ABI and LLE arterial duplex.  They know to call sooner if he develops any issues before then.   -continue asa/plavix/Praluent  Randie Heinz, [facility] [phone]   Clinic MD:  [phone]

## 2022-02-18 NOTE — Addendum Note (Signed)
Addended by: Anabel Halon on: 02/18/2022 08:59 AM   Modules accepted: Orders

## 2022-02-18 NOTE — Telephone Encounter (Signed)
Debbie Small with [facility] called requesting PT weekly x 3 weeks.   Reviewed pt's chart, returned call for clarification, two identifiers used. Gave verbal orders. Confirmed understanding.

## 2022-02-18 NOTE — Progress Notes (Signed)
Subjective:    Patient ID: [name], male    DOB: 1941-04-10, 81 y.o.   MRN: [id number]  HPI  Pt in for follow up.  Pt had nails trimmed yesterday with podiatrist.  Pt has appointment with vascular surgeon and wound care tomorrow.  Pt has mild swelling thigh and left calf during day but does go down at night.  02-12-22 appt with surgeon in " below.  "Assessment/Plan:  This is a 81 y.o. male who is s/p:Left common femoral to below-knee popliteal artery bypass with 6 mm ringed PTFE on 01/28/22 by Dr. 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               The left groin has lymphocele drainage without signs of infection.  Continue dry guaze as needed.  F/U wound care center for toe checks.  I gave him a hand out for proper elevation.  He will try this multiple times a day.  If he has continued edema he can be placed in mild compression.  He will f/u in 1 week for left groin check.  If he develops fever, chills or purulent."   For foot/toe infection pt is on levofloxin.    Pt states yesterday he had 2 events of dizziness 15-20 minutes of dizziness. No gross or motor sensory deficits. Both times dizziness started on bending over and standing up.   Pt has CAD. No chest pain or palpatiton asscociated with dizziness  02-12-2022 cardiologist note reviewed. Pt cardilogist is Dr. Allyson Sabal.         Review of Systems  Constitutional:  Negative for chills, fatigue and fever.  HENT:  Negative for congestion, drooling, ear discharge and ear pain.   Respiratory:  Negative for cough, chest tightness, shortness of breath and wheezing.   Cardiovascular:  Negative for chest pain and palpitations.  Gastrointestinal:  Negative for abdominal pain, constipation and nausea.  Genitourinary:  Negative for dysuria and frequency.  Musculoskeletal:  Negative for back pain and myalgias.  Skin:  Negative for rash.  Neurological:  Negative for dizziness,  seizures, syncope, weakness and headaches.  Hematological:  Negative for adenopathy. Does not bruise/bleed easily.  Psychiatric/Behavioral:  Negative for behavioral problems and confusion. The patient is not nervous/anxious and is not hyperactive.     Past Medical History:  Diagnosis Date   Claudication (HCC) 06/27/2011   Left ABIs abnormal values with mild-moderate arterial insufficiency, right SFA moderate amount of mixed density plaque elevating velocities suggestive 50-69% diameter reduction   Coronary artery disease 06/27/2011   Normal Lexiscan, EF 64, post-stress EF 64, no EKG changes   Diabetes mellitus without complication (HCC)    prediabetic   Hyperlipidemia    Hypertension    Impotence of organic origin    Osteoarthritis    Other testicular hypofunction    PVD (peripheral vascular disease) (HCC)      Social History   Socioeconomic History   Marital status: Married    Spouse name: cathy   Number of children: 1   Years of education: 12   Highest education level: Not on file  Occupational History   Occupation: retired  Tobacco Use   Smoking status: Former    Packs/day: 1.00    Years: 50.00    Total pack years: 50.00    Types: Cigarettes    Quit date: 12/17/2004    Years since quitting: 17.1   Smokeless tobacco: Never  Vaping Use   Vaping Use:  Never used  Substance and Sexual Activity   Alcohol use: Yes    Comment: occasionally   Drug use: No   Sexual activity: Not on file  Other Topics Concern   Not on file  Social History Narrative   Lives with wife   Caffeine - coffee, 1 cup daily   Social Determinants of Health   Financial Resource Strain: Not on file  Food Insecurity: No Food Insecurity (02/04/2022)   Hunger Vital Sign    Worried About Running Out of Food in the Last Year: Never true    Ran Out of Food in the Last Year: Never true  Transportation Needs: No Transportation Needs (02/04/2022)   PRAPARE - Administrator, Civil Service  (Medical): No    Lack of Transportation (Non-Medical): No  Physical Activity: Not on file  Stress: Not on file  Social Connections: Not on file  Intimate Partner Violence: Not At Risk (02/05/2022)   Humiliation, Afraid, Rape, and Kick questionnaire    Fear of Current or Ex-Partner: No    Emotionally Abused: No    Physically Abused: No    Sexually Abused: No    Past Surgical History:  Procedure Laterality Date   ABDOMINAL AORTOGRAM N/A 06/28/2020   Procedure: ABDOMINAL AORTOGRAM;  Surgeon: [name], MD;  Location: [facility] INVASIVE CV LAB;  Service: Cardiovascular;  Laterality: N/A;   ABDOMINAL AORTOGRAM W/LOWER EXTREMITY Left 01/27/2022   Procedure: ABDOMINAL AORTOGRAM W/LOWER EXTREMITY;  Surgeon: Maeola Harman, MD;  Location: [facility] INVASIVE CV LAB;  Service: Cardiovascular;  Laterality: Left;   CARDIAC CATHETERIZATION  12/26/2004   Left main-90% distal tapered stenosis, left circumflex-95% ostial stenosis, first marginal branch 99% ostial branch, posterior descending artery 99% ostial stenosis, 60-70% segmental mid stenosis, needed CABG   CORONARY ARTERY BYPASS GRAFT  12/27/2004   Left main 3-vessel disease, LIMA to LAD, saphenous vein to circumflexmarginal branch and to the PDA   ENDARTERECTOMY FEMORAL Left 01/28/2022   Procedure: LEFT ENDARTERECTOMYCOMMON  [facility] BELOW KNEE POPLITEAL;  Surgeon: Maeola Harman, MD;  Location: [facility] OR;  Service: Vascular;  Laterality: Left;   FEMORAL-POPLITEAL BYPASS GRAFT Left 01/28/2022   Procedure: LEFT FEMORAL-POPLITEAL ARTERY BYPASS;  Surgeon: Maeola Harman, MD;  Location: [facility] OR;  Service: Vascular;  Laterality: Left;   INGUINAL HERNIA REPAIR Left 2005   LOWER EXTREMITY ANGIOGRAPHY N/A 04/13/2017   Procedure: LOWER EXTREMITY ANGIOGRAPHY;  Surgeon: [name], MD;  Location: [facility] INVASIVE CV LAB;  Service: Cardiovascular;  Laterality: N/A;   LOWER EXTREMITY ANGIOGRAPHY Bilateral 12/06/2020   Procedure: Lower  Extremity Angiography;  Surgeon: [name], MD;  Location: [facility] INVASIVE CV LAB;  Service: Cardiovascular;  Laterality: Bilateral;  Limited Study   LOWER EXTREMITY INTERVENTION N/A 08/04/2016   Procedure: Lower Extremity Intervention;  Surgeon: [name], MD;  Location: [facility] INVASIVE CV LAB;  Service: Cardiovascular;  Laterality: N/A;   PERIPHERAL VASCULAR ATHERECTOMY Left 04/13/2017   Procedure: PERIPHERAL VASCULAR ATHERECTOMY;  Surgeon: [name], MD;  Location: [facility] INVASIVE CV LAB;  Service: Cardiovascular;  Laterality: Left;  SFA   PERIPHERAL VASCULAR ATHERECTOMY  12/06/2020   Procedure: PERIPHERAL VASCULAR ATHERECTOMY;  Surgeon: [name], MD;  Location: [facility] INVASIVE CV LAB;  Service: Cardiovascular;;  Lt Iliac   PERIPHERAL VASCULAR BALLOON ANGIOPLASTY Left 04/13/2017   Procedure: PERIPHERAL VASCULAR BALLOON ANGIOPLASTY;  Surgeon: [name], MD;  Location: [facility] INVASIVE CV LAB;  Service: Cardiovascular;  Laterality: Left;  SFA   PERIPHERAL VASCULAR  INTERVENTION  12/06/2020   Procedure: PERIPHERAL VASCULAR INTERVENTION;  Surgeon: [name], MD;  Location: [facility] INVASIVE CV LAB;  Service: Cardiovascular;;  Lt Iliac   RIGHT/LEFT HEART CATH AND CORONARY/GRAFT ANGIOGRAPHY N/A 06/28/2020   Procedure: RIGHT/LEFT HEART CATH AND CORONARY/GRAFT ANGIOGRAPHY;  Surgeon: [name], MD;  Location: [facility] INVASIVE CV LAB;  Service: Cardiovascular;  Laterality: N/A;    Family History  Problem Relation Age of Onset   Hypertension Mother        24   Clotting disorder Father        8   Diabetes Brother        heart surgery    Allergies  Allergen Reactions   Crestor [Rosuvastatin] Other (See Comments)    Myalgias   Lipitor [Atorvastatin] Other (See Comments)    Myalgias    Zestril [Lisinopril] Cough    Current Outpatient Medications on File Prior to Visit  Medication Sig Dispense Refill   amLODipine (NORVASC) 10 MG tablet TAKE 1 TABLET BY MOUTH EVERY DAY  (Patient taking differently: Take 10 mg by mouth daily.) 90 tablet 3   aspirin 81 MG tablet Take 81 mg by mouth daily.     Cholecalciferol (VITAMIN D) 50 MCG (2000 UT) tablet Take 2,000 Units by mouth daily.     clopidogrel (PLAVIX) 75 MG tablet TAKE 1 TABLET BY MOUTH EVERY DAY (Patient taking differently: Take 75 mg by mouth daily.) 90 tablet 3   Coenzyme Q10 (COQ10) 200 MG CAPS Take 200 mg by mouth daily.     famotidine (PEPCID) 20 MG tablet Take 1 tablet (20 mg total) by mouth daily. 90 tablet 1   fluticasone (FLONASE) 50 MCG/ACT nasal spray Place 2 sprays into both nostrils daily. 16 g 1   isosorbide mononitrate (IMDUR) 60 MG 24 hr tablet Take 1 tablet (60 mg total) by mouth daily. 90 tablet 3   levocetirizine (XYZAL) 5 MG tablet Take 1 tablet (5 mg total) by mouth every evening. 90 tablet 1   levofloxacin (LEVAQUIN) 750 MG tablet Take 750 mg by mouth daily. 30 day course.     metFORMIN (GLUCOPHAGE) 500 MG tablet Take 1 tablet (500 mg total) by mouth 2 (two) times daily with a meal. 180 tablet 3   metoprolol succinate (TOPROL-XL) 50 MG 24 hr tablet Take 1 tablet (50 mg total) by mouth 2 (two) times daily. Take with or immediately following a meal. 30 tablet 6   nitroGLYCERIN (NITROSTAT) 0.4 MG SL tablet Place 1 tablet (0.4 mg total) under the tongue every 5 (five) minutes as needed for chest pain. 25 tablet 3   Omega-3 Fatty Acids (FISH OIL) 1200 MG CAPS Take 2 capsules (2,400 mg total) by mouth every morning.     oxyCODONE-acetaminophen (PERCOCET/ROXICET) 5-325 MG tablet Take 1 tablet by mouth every 6 (six) hours as needed for severe pain. 15 tablet 0   PRALUENT 150 MG/ML SOAJ INJECT 150 MG INTO THE SKIN EVERY 14 (FOURTEEN) DAYS. 2 mL 11   silver sulfADIAZINE (SILVADENE) 1 % cream Apply 1 Application topically 2 (two) times daily. 50 g 0   No current facility-administered medications on file prior to visit.    BP 110/60   Pulse 65   Temp 98.4 F (36.9 C)   Resp 18   Ht 6\' 1"  (1.854 m)    Wt 217 lb (98.4 kg)   SpO2 98%   BMI 28.63 kg/m        Objective:   Physical Exam  General Mental Status- Alert. General Appearance- Not in acute distress.   Skin General: Color- Normal Color. Moisture- Normal Moisture.  Neck Carotid Arteries- Normal color. Moisture- Normal Moisture. No carotid bruits. No JVD.  Chest and Lung Exam Auscultation: Breath Sounds:-Normal.  Cardiovascular Auscultation:Rythm- Regular. Murmurs & Other Heart Sounds:Auscultation of the heart reveals- No Murmurs.  Abdomen Inspection:-Inspeection Normal. Palpation/Percussion:Note:No mass. Palpation and Percussion of the abdomen reveal- Non Tender, Non Distended + BS, no rebound or guarding.   Neurologic Cranial Nerve exam:- CN III-XII intact(No nystagmus), symmetric smile. Drift Test:- No drift. Romberg Exam:- Negative.  Finger to Nose:- Normal/Intact Strength:- 5/5 equal and symmetric strength both upper and lower extremities.        Assessment & Plan:   Patient Instructions  Recent brief episodes of dizziness yesterday twice.  Both events lasting about 15 to 20 minutes.  No motor or sensory deficits described.  Normal neurologic exam today.  Blood pressure in the good but lower and range.  If you have recurrent dizzy events I want you to check your blood pressure, pulse and sugar levels.  If blood pressures trending downward less than 110 that you might need adjustment of medications affect blood pressure.  Sugar levels over 200 or less than 70 may be a factor in dizziness as well.  Making limited number of meclizine available to use for dizziness lasting more than 10 minutes.  If any dizziness with any motor or sensory deficits then be seen in the emergency department.  Also changing positions from standing to sitting might cause a brief transient dizziness.  I did get balance before ambulating/walking.   History of CABG.  Recent cardiology note reviewed.  Hypertension-blood pressure tightly  controlled today.  Chronic wound with peripheral artery disease.  Continue to follow vascular surgeon notes and the wound care notes when sent to me.  We will get CBC and metabolic panel as part of work-up for dizziness.  Follow-up in 3 weeks or sooner if needed.   Mackie Pai, PA-C

## 2022-02-19 ENCOUNTER — Encounter (HOSPITAL_BASED_OUTPATIENT_CLINIC_OR_DEPARTMENT_OTHER): Payer: Medicare HMO | Attending: Physician Assistant | Admitting: Physician Assistant

## 2022-02-19 ENCOUNTER — Ambulatory Visit (INDEPENDENT_AMBULATORY_CARE_PROVIDER_SITE_OTHER): Payer: Medicare HMO | Admitting: Physician Assistant

## 2022-02-19 VITALS — BP 113/68 | HR 86 | Temp 97.8°F | Ht 73.0 in | Wt 218.0 lb

## 2022-02-19 DIAGNOSIS — M1990 Unspecified osteoarthritis, unspecified site: Secondary | ICD-10-CM | POA: Diagnosis not present

## 2022-02-19 DIAGNOSIS — I10 Essential (primary) hypertension: Secondary | ICD-10-CM | POA: Insufficient documentation

## 2022-02-19 DIAGNOSIS — L97524 Non-pressure chronic ulcer of other part of left foot with necrosis of bone: Secondary | ICD-10-CM | POA: Diagnosis not present

## 2022-02-19 DIAGNOSIS — I771 Stricture of artery: Secondary | ICD-10-CM | POA: Insufficient documentation

## 2022-02-19 DIAGNOSIS — I2510 Atherosclerotic heart disease of native coronary artery without angina pectoris: Secondary | ICD-10-CM | POA: Diagnosis not present

## 2022-02-19 DIAGNOSIS — E1151 Type 2 diabetes mellitus with diabetic peripheral angiopathy without gangrene: Secondary | ICD-10-CM | POA: Diagnosis not present

## 2022-02-19 DIAGNOSIS — E11621 Type 2 diabetes mellitus with foot ulcer: Secondary | ICD-10-CM | POA: Diagnosis present

## 2022-02-19 DIAGNOSIS — I739 Peripheral vascular disease, unspecified: Secondary | ICD-10-CM

## 2022-02-19 NOTE — Progress Notes (Signed)
JAQUANE, BOUGHNER ([id number]) Visit Report for 02/19/2022 Chief Complaint Document Details Patient Name: Date of Service: [name] [facility] RD 02/19/2022 9:15 A M Medical Record Number: [id number] Patient Account Number: [id number] Date of Birth/Sex: Treating RN: 12-03-1940 (81 y.o. M) Primary Care Provider: Mackie Pai Other Clinician: Referring Provider: Treating Provider/Extender: Sheran Lawless in Treatment: 7 Information Obtained from: Patient Chief Complaint Left 1st and 2nd toe ulcers Electronic Signature(s) Signed: 02/19/2022 9:22:37 AM By: Worthy Keeler PA-C Entered By: Worthy Keeler on 02/19/2022 09:22:37 -------------------------------------------------------------------------------- Problem List Details Patient Name: Date of Service: [name] LFO RD 02/19/2022 9:15 A M Medical Record Number: [id number] Patient Account Number: [id number] Date of Birth/Sex: Treating RN: April 05, 1941 (81 y.o. M) Primary Care Provider: Mackie Pai Other Clinician: Referring Provider: Treating Provider/Extender: Sheran Lawless in Treatment: 7 Active Problems ICD-10 Encounter Code Description Active Date MDM Diagnosis I73.89 Other specified peripheral vascular diseases 01/01/2022 No Yes L97.522 Non-pressure chronic ulcer of other part of left foot with fat layer exposed 01/01/2022 No Yes L97.524 Non-pressure chronic ulcer of other part of left foot with necrosis of bone 02/12/2022 No Yes R73.03 Prediabetes 01/01/2022 No Yes I10 Essential (primary) hypertension 01/01/2022 No Yes Inactive Problems Resolved Problems Electronic Signature(s) Signed: 02/19/2022 9:22:18 AM By: Worthy Keeler PA-C Entered By: Worthy Keeler on 02/19/2022 09:22:18

## 2022-02-20 ENCOUNTER — Other Ambulatory Visit: Payer: Self-pay

## 2022-02-20 DIAGNOSIS — I739 Peripheral vascular disease, unspecified: Secondary | ICD-10-CM

## 2022-02-20 NOTE — Progress Notes (Signed)
TAKSH, GRING ([id number]) Visit Report for 02/19/2022 Arrival Information Details Patient Name: Date of Service: Evelina Bucy [facility] RD 02/19/2022 9:15 A M Medical Record Number: [id number] Patient Account Number: [id number] Date of Birth/Sex: Treating RN: April 27, 1941 (81 y.o. M) Primary Care Tamantha Saline: [name] Other Clinician: Referring Niranjan Rufener: Treating Arlette Schaad/Extender: [name] in Treatment: 7 Visit Information History Since Last Visit Added or deleted any medications: No Patient Arrived: Ambulatory Any new allergies or adverse reactions: No Arrival Time: 09:00 Had a fall or experienced change in No Accompanied By: wife activities of daily living that may affect Transfer Assistance: None risk of falls: Patient Identification Verified: Yes Signs or symptoms of abuse/neglect since last visito No Secondary Verification Process Completed: Yes Hospitalized since last visit: No Patient Requires Transmission-Based Precautions: No Implantable device outside of the clinic excluding No Patient Has Alerts: Yes cellular tissue based products placed in the center Patient Alerts: ABI's:02/23 R: N/C L:0.6 since last visit: TBI's:02/23 R:0.62L:0.26 Has Dressing in Place as Prescribed: Yes Pain Present Now: No Electronic Signature(s) Signed: 02/19/2022 4:20:32 PM By: Thayer Dallas Entered By: Thayer Dallas on 02/19/2022 09:00:35 -------------------------------------------------------------------------------- Encounter Discharge Information Details Patient Name: Date of Service: Violet Baldy, A LFO RD 02/19/2022 9:15 A M Medical Record Number: [id number] Patient Account Number: [id number] Date of Birth/Sex: Treating RN: 12-21-1940 (81 y.o. [name], [name] Primary Care Karinna Beadles: [name] Other Clinician: Referring Joshlynn Alfonzo: Treating Leisel Pinette/Extender: [name] in Treatment: 7 Encounter Discharge Information Items Post  Procedure Vitals Discharge Condition: Stable Temperature (F): 98.7 Ambulatory Status: Ambulatory Pulse (bpm): 74 Discharge Destination: Home Respiratory Rate (breaths/min): 17 Transportation: Private Auto Blood Pressure (mmHg): 120/80 Accompanied By: wife Schedule Follow-up Appointment: Yes Clinical Summary of Care: Patient Declined Electronic Signature(s) Signed: 02/20/2022 3:46:02 PM By: [name] RN Entered By: [name] on 02/19/2022 09:43:28 -------------------------------------------------------------------------------- Lower Extremity Assessment Details Patient Name: Date of Service: Violet Baldy, A LFO RD 02/19/2022 9:15 A M Medical Record Number: [id number] Patient Account Number: [id number] Date of Birth/Sex: Treating RN: [birth year] (81 y.o. M) Primary Care Farrah Skoda: [name] Other Clinician: Referring Tulsi Crossett: Treating Shenell Rogalski/Extender: [name] in Treatment: 7 Edema Assessment Assessed: [Left: No] [Right: No] Edema: [Left: Ye] [Right: s] Calf Left: Right: Point of Measurement: 37 cm From Medial Instep 36.5 cm Ankle Left: Right: Point of Measurement: 10 cm From Medial Instep 24 cm Vascular Assessment Pulses: Dorsalis Pedis Palpable: [Left:Yes] Electronic Signature(s) Signed: 02/19/2022 4:20:32 PM By: Thayer Dallas Entered By: Thayer Dallas on 02/19/2022 09:12:57 -------------------------------------------------------------------------------- Multi-Disciplinary Care Plan Details Patient Name: Date of Service: Violet Baldy, A LFO RD 02/19/2022 9:15 A M Medical Record Number: [id number] Patient Account Number: [id number] Date of Birth/Sex: Treating RN: 1940-10-26 (81 y.o. [name] Primary Care Kiarah Eckstein: [name] Other Clinician: Referring Keria Widrig: Treating Beckem Tomberlin/Extender: [name] in Treatment: 7 Active Inactive Tissue Oxygenation Nursing Diagnoses: Actual  ineffective tissue perfusion; peripheral (select once diagnosis is confirmed) Knowledge deficit related to disease process and management Potential alteration in peripheral tissue perfusion (select prior to confirmation of diagnosis) Goals: Invasive arterial studies completed as ordered Date Initiated: 01/01/2022 Target Resolution Date: 03/01/2022 Goal Status: Active Non-invasive arterial studies are completed as ordered Date Initiated: 01/01/2022 Target Resolution Date: 02/22/2022 Goal Status: Active Patient/caregiver will verbalize understanding of disease process and disease management Date Initiated: 01/01/2022 Target Resolution Date: 02/22/2022 Goal Status: Active Interventions: Assess patient understanding of disease process and management upon diagnosis and  as needed Assess peripheral arterial status upon admission and as needed Provide education on tissue oxygenation and ischemia Notes: Wound/Skin Impairment Nursing Diagnoses: Impaired tissue integrity Knowledge deficit related to ulceration/compromised skin integrity Goals: Patient will have a decrease in wound volume by X% from date: (specify in notes) Date Initiated: 01/01/2022 Target Resolution Date: 02/22/2022 Goal Status: Active Patient/caregiver will verbalize understanding of skin care regimen Date Initiated: 01/01/2022 Target Resolution Date: 02/22/2022 Goal Status: Active Ulcer/skin breakdown will have a volume reduction of 30% by week 4 Date Initiated: 01/01/2022 Target Resolution Date: 02/22/2022 Goal Status: Active Interventions: Assess patient/caregiver ability to obtain necessary supplies Assess patient/caregiver ability to perform ulcer/skin care regimen upon admission and as needed Assess ulceration(s) every visit Notes: Electronic Signature(s) Signed: 02/20/2022 3:46:02 PM By: Rhae Hammock RN Entered By: Rhae Hammock on 02/19/2022  09:27:48 -------------------------------------------------------------------------------- Pain Assessment Details Patient Name: Date of Service: [name], A LFO RD 02/19/2022 9:15 A M Medical Record Number: [id number] Patient Account Number: [id number] Date of Birth/Sex: Treating RN: [birth year] (81 y.o. M) Primary Care Camara Rosander: Mackie Pai Other Clinician: Referring Denae Zulueta: Treating Tykesha Konicki/Extender: Sheran Lawless in Treatment: 7 Active Problems Location of Pain Severity and Description of Pain Patient Has Paino No Site Locations Pain Management and Medication Current Pain Management: Electronic Signature(s) Signed: 02/19/2022 4:20:32 PM By: Erenest Blank Entered By: Erenest Blank on 02/19/2022 09:01:02 -------------------------------------------------------------------------------- Patient/Caregiver Education Details Patient Name: Date of Service: [name] LFO RD 10/4/2023andnbsp9:15 A M Medical Record Number: [id number] Patient Account Number: [id number] Date of Birth/Gender: Treating RN: 21-Oct-1940 (81 y.o. Erie Noe Primary Care Physician: Mackie Pai Other Clinician: Referring Physician: Treating Physician/Extender: Sheran Lawless in Treatment: 7 Education Assessment Education Provided To: Patient Education Topics Provided Tissue Oxygenation: Methods: Explain/Verbal Responses: State content correctly Electronic Signature(s) Signed: 02/20/2022 3:46:02 PM By: Rhae Hammock RN Entered By: Rhae Hammock on 02/19/2022 09:27:58 -------------------------------------------------------------------------------- Wound Assessment Details Patient Name: Date of Service: [name], A LFO RD 02/19/2022 9:15 A M Medical Record Number: [id number] Patient Account Number: [id number] Date of Birth/Sex: Treating RN: 21-Mar-1941 (81 y.o. M) Primary Care Ordean Fouts: Mackie Pai Other Clinician: Referring  Tynika Luddy: Treating Mariea Mcmartin/Extender: Sheran Lawless in Treatment: 7 Wound Status Wound Number: 1 Primary Arterial Insufficiency Ulcer Etiology: Wound Location: Left T Great oe Wound Open Wounding Event: Trauma Status: Date Acquired: 12/01/2021 Comorbid Coronary Artery Disease, Hypotension, Peripheral Arterial Weeks Of Treatment: 7 History: Disease, Peripheral Venous Disease, Type II Diabetes, Clustered Wound: No Osteoarthritis Photos Wound Measurements Length: (cm) 4.5 Width: (cm) 1 Depth: (cm) 0.1 Area: (cm) 3.534 Volume: (cm) 0.353 % Reduction in Area: 64% % Reduction in Volume: 82% Epithelialization: Medium (34-66%) Tunneling: No Undermining: No Wound Description Classification: Full Thickness With Exposed Support Structures Wound Margin: Distinct, outline attached Exudate Amount: Medium Exudate Type: Serosanguineous Exudate Color: red, brown Foul Odor After Cleansing: No Slough/Fibrino Yes Wound Bed Granulation Amount: Medium (34-66%) Exposed Structure Granulation Quality: Red, Pink Fascia Exposed: No Necrotic Amount: Medium (34-66%) Fat Layer (Subcutaneous Tissue) Exposed: Yes Tendon Exposed: No Muscle Exposed: No Joint Exposed: No Bone Exposed: No Periwound Skin Texture Texture Color No Abnormalities Noted: Yes No Abnormalities Noted: Yes Moisture No Abnormalities Noted: Yes Treatment Notes Wound #1 (Toe Great) Wound Laterality: Left Cleanser Soap and Water Discharge Instruction: May shower and wash wound with dial antibacterial soap and water prior to dressing change. Wound Cleanser Discharge Instruction: Cleanse the wound with wound cleanser prior to applying a clean  dressing using gauze sponges, not tissue or cotton balls. Peri-Wound Care Topical Primary Dressing Promogran Prisma Matrix, 4.34 (sq in) (silver collagen) Discharge Instruction: Moisten collagen with saline or hydrogel Xeroform Occlusive Gauze Dressing,  4x4 in Discharge Instruction: Apply to wound bed as instructed Secondary Dressing Woven Gauze Sponge, Non-Sterile 4x4 in Discharge Instruction: Apply over primary dressing as directed. Secured With Conforming Stretch Gauze Bandage, Sterile 2x75 (in/in) Discharge Instruction: Secure with stretch gauze as directed. 36M Medipore H Soft Cloth Surgical T ape, 4 x 10 (in/yd) Discharge Instruction: Secure with tape as directed. Stretch Net Size 2, 10 (yds) Compression Wrap Compression Stockings Add-Ons Electronic Signature(s) Signed: 02/19/2022 4:20:32 PM By: Erenest Blank Entered By: Erenest Blank on 02/19/2022 09:09:18 -------------------------------------------------------------------------------- Wound Assessment Details Patient Name: Date of Service: [name], A LFO RD 02/19/2022 9:15 A M Medical Record Number: [id number] Patient Account Number: [id number] Date of Birth/Sex: Treating RN: Apr 10, 1941 (81 y.o. M) Primary Care Dakiya Puopolo: Mackie Pai Other Clinician: Referring Candiace West: Treating Betsi Crespi/Extender: Sheran Lawless in Treatment: 7 Wound Status Wound Number: 2 Primary Arterial Insufficiency Ulcer Etiology: Wound Location: Left T Second oe Wound Open Wounding Event: Trauma Status: Date Acquired: 12/01/2021 Comorbid Coronary Artery Disease, Hypotension, Peripheral Arterial Weeks Of Treatment: 7 History: Disease, Peripheral Venous Disease, Type II Diabetes, Clustered Wound: No Osteoarthritis Photos Wound Measurements Length: (cm) 0.3 Width: (cm) 0.2 Depth: (cm) 0.2 Area: (cm) 0.047 Volume: (cm) 0.009 % Reduction in Area: 88% % Reduction in Volume: 76.9% Epithelialization: None Tunneling: No Undermining: No Wound Description Classification: Full Thickness With Exposed Support Structures Wound Margin: Distinct, outline attached Exudate Amount: Medium Exudate Type: Serosanguineous Exudate Color: red, brown Wound Bed Granulation  Amount: Medium (34-66%) Granulation Quality: Red, Pink Necrotic Amount: Medium (34-66%) Foul Odor After Cleansing: No Slough/Fibrino Yes Exposed Structure Fascia Exposed: No Fat Layer (Subcutaneous Tissue) Exposed: Yes Tendon Exposed: No Muscle Exposed: No Joint Exposed: No Bone Exposed: No Periwound Skin Texture Texture Color No Abnormalities Noted: No No Abnormalities Noted: Yes Callus: Yes Temperature / Pain Crepitus: No Temperature: No Abnormality Excoriation: No Induration: No Rash: No Scarring: No Moisture No Abnormalities Noted: Yes Treatment Notes Wound #2 (Toe Second) Wound Laterality: Left Cleanser Soap and Water Discharge Instruction: May shower and wash wound with dial antibacterial soap and water prior to dressing change. Wound Cleanser Discharge Instruction: Cleanse the wound with wound cleanser prior to applying a clean dressing using gauze sponges, not tissue or cotton balls. Peri-Wound Care Topical Primary Dressing Promogran Prisma Matrix, 4.34 (sq in) (silver collagen) Discharge Instruction: Moisten collagen and put on wound bed of 2nd toe and then xeroform on top Xeroform Occlusive Gauze Dressing, 4x4 in Discharge Instruction: Apply to wound bed as instructed Secondary Dressing Woven Gauze Sponge, Non-Sterile 4x4 in Discharge Instruction: Apply over primary dressing as directed. Secured With Conforming Stretch Gauze Bandage, Sterile 2x75 (in/in) Discharge Instruction: Secure with stretch gauze as directed. 36M Medipore H Soft Cloth Surgical T ape, 4 x 10 (in/yd) Discharge Instruction: Secure with tape as directed. Stretch Net Size 2, 10 (yds) Compression Wrap Compression Stockings Add-Ons Electronic Signature(s) Signed: 02/19/2022 4:20:32 PM By: Erenest Blank Entered By: Erenest Blank on 02/19/2022 09:10:36 -------------------------------------------------------------------------------- Vitals Details Patient Name: Date of Service: [name],  A LFO RD 02/19/2022 9:15 A M Medical Record Number: [id number] Patient Account Number: [id number] Date of Birth/Sex: Treating RN: August 18, 1940 (81 y.o. M) Primary Care Tangee Marszalek: Mackie Pai Other Clinician: Referring Cathye Kreiter: Treating Zubair Lofton/Extender: Sheran Lawless in Treatment: 7  Vital Signs Time Taken: 09:00 Temperature (F): 98 Pulse (bpm): 70 Respiratory Rate (breaths/min): 16 Blood Pressure (mmHg): 110/60 Reference Range: 80 - 120 mg / dl Electronic Signature(s) Signed: 02/19/2022 4:20:32 PM By: Erenest Blank Entered By: Erenest Blank on 02/19/2022 09:00:56

## 2022-02-26 ENCOUNTER — Encounter (HOSPITAL_BASED_OUTPATIENT_CLINIC_OR_DEPARTMENT_OTHER): Payer: Medicare HMO | Admitting: Physician Assistant

## 2022-02-26 DIAGNOSIS — E11621 Type 2 diabetes mellitus with foot ulcer: Secondary | ICD-10-CM | POA: Diagnosis not present

## 2022-02-26 NOTE — Progress Notes (Addendum)
[name], [place] ([id number]) [id number] Page 1 of 8 Visit Report for 02/26/2022 Chief Complaint Document Details Patient Name: Date of Service: [name] [facility] RD 02/26/2022 11:30 A M Medical Record Number: [id number] Patient Account Number: [id number] Date of Birth/Sex: Treating RN: [birth year] (81 y.o. M) Primary Care Provider: Mackie Pai Other Clinician: Referring Provider: Treating Provider/Extender: Sheran Lawless in Treatment: 8 Information Obtained from: Patient Chief Complaint Left 1st and 2nd toe ulcers Electronic Signature(s) Signed: 02/26/2022 11:49:47 AM By: Richard Keeler PA-C Entered By: [name] on 02/26/2022 11:49:47 -------------------------------------------------------------------------------- Debridement Details Patient Name: Date of Service: [name], A LFO RD 02/26/2022 11:30 A M Medical Record Number: [id number] Patient Account Number: [id number] Date of Birth/Sex: Treating RN: 20-Jun-1940 (81 y.o. Mare Ferrari Primary Care Provider: Mackie Pai Other Clinician: Referring Provider: Treating Provider/Extender: Sheran Lawless in Treatment: 8 Debridement Performed for Assessment: Wound #2 Left T Second oe Performed By: Physician Richard Keeler, PA Debridement Type: Debridement Severity of Tissue Pre Debridement: Fat layer exposed Level of Consciousness (Pre-procedure): Awake and Alert Pre-procedure Verification/Time Out Yes - 12:18 Taken: Start Time: 12:18 Pain Control: Lidocaine 5% topical ointment T Area Debrided (L x W): otal 0.5 (cm) x 0.5 (cm) = 0.25 (cm) Tissue and other material debrided: Non-Viable, Callus Level: Non-Viable Tissue Debridement Description: Selective/Open Wound Instrument: Curette Bleeding: Minimum Hemostasis Achieved: Pressure Procedural Pain: 0 Post Procedural Pain: 0 Response to Treatment: Procedure was tolerated well Level of  Consciousness (Post- Awake and Alert procedure): Post Debridement Measurements of Total Wound Length: (cm) 0.5 Width: (cm) 0.5 Depth: (cm) 0.2 Volume: (cm) 0.039 Character of Wound/Ulcer Post Debridement: Improved Severity of Tissue Post Debridement: Fat layer exposed [place], [place] ([id number]) [id number] Page 2 of 8 Post Procedure Diagnosis Same as Pre-procedure Notes scribed for Richard Cos, PA by Richard Creamer, RN Electronic Signature(s) Signed: 02/26/2022 5:12:24 PM By: Richard Keeler PA-C Signed: 03/03/2022 1:44:39 PM By: Richard Creamer RN, BSN Entered By: [name] on 02/26/2022 16:41:02 -------------------------------------------------------------------------------- HPI Details Patient Name: Date of Service: [name], A LFO RD 02/26/2022 11:30 A M Medical Record Number: [id number] Patient Account Number: [id number] Date of Birth/Sex: Treating RN: 10/07/1940 (81 y.o. M) Primary Care Provider: Mackie Pai Other Clinician: Referring Provider: Treating Provider/Extender: Sheran Lawless in Treatment: 8 History of Present Illness HPI Description: 01-01-2022 upon evaluation today patient appears to be doing poorly in regard to her wound that is on the left great toe and second toe location. The toenail of the second toe covers over a portion of the wound which I am concerned may be hiding a piece of exposed bone based on what I see today. The patient does have a history of peripheral vascular disease he is a patient of Dr. Alvester Chou who did an abdominal aortogram previous and was unable to intervene with regard to the lower extremity blood flow. The patient had a right ABI which was noncompressible and a left ABI was 0.6 with TBI's of 0.26 unfortunately. The TBI on the right was 0.62 which was not nearly as bad. Nonetheless the unfortunate thing is that with poor blood flow and potential for bone exposure this becomes  increasingly complicated if the patient does have osteomyelitis to get this to heal. He does have repeat vascular testing on Monday upcoming. The patient is stated to be "prediabetic" according to notes and the patient. Currently he has been on Keflex. Patient has a history of  peripheral vascular disease, prediabetes, hypertension, and unfortunately the current wound which has been present for 4 weeks based on what they tell me today. 01-08-2022 upon evaluation today patient appears to be doing about the same in regard to his wound. Again the x-ray was negative that was performed on 17th everything looks to be okay. With that being said he is continuing to have issues here with poor blood flow and perfusion he did have a repeat arterial study and ABI with TBI TBI could not even be obtained because the signals were so dampened. Previously he had been at 0.2. In regard to the ABI was previously around 0.65 he was now around 0.45. Again this was definitely a decrease on the left side compared to previous the right was stable. He is stated to have critical limb ischemia. 01-15-2022 upon evaluation today patient appears to be doing well currently in regard to his wounds all things considered. Were seeing more growth on the big toe versus the second toe but this is to be expected at the second toe does have some bone exposed. Fortunately I do not see any evidence of active infection at this time. He does have an appointment scheduled for September 11 which is Monday for an arteriogram and then subsequently the 12th for bypass surgery. Subsequently I think this is can be very good for him as far as trying to get this healed I believe it is good to be the best plan that we have as far as getting good blood flow to allow for healing to continue. Once we get good blood flow I think we can then clean away some of the necrotic bone if necessary to try and allow this to heal more effectively. We also discussed  briefly hyperbaric oxygen therapy and the possibility of distal toe amputation if need be but again right now we are still on the plantar stages of even knowing what we can do from a wound care perspective which I think is the first initial goal here. 01-22-2022 upon evaluation today patient appears to be doing better currently in regard to his wounds all things considered. He actually is good to be having his vascular procedures upcoming next week. Nonetheless I think once we get better blood flow will be able to see where things stand. He is also going to have his MRI on Sunday which I think will also be very beneficial for [place] to know exactly what is going on with the second toe in particular and the way we will get a proceed following. 02-12-2022 upon evaluation today patient appears to be doing well currently in regard to his wounds in fact he is showing signs of significant improvement which is great news. Fortunately I see no evidence of active infection locally or systemically at this time which is great news he has had his bypass on the left lower extremity and this does seem to be doing much better compared to what we were previous. He is in fact about twice a small on the great toe as it was last time I saw him I am very pleased with where things stand with the second toe as well though there is still bone exposed there seems to be excellent blood flow which I think in turn is can allow this to actually heal that is what we want. 02-19-2022 upon evaluation today patient appears to be doing excellent in regard to his wounds. He is going require some sharp debridement but to be honest  I think he should really do quite well with this. He did have a lot more bleeding from the second toe after I did clean away some of the necrotic bone last week this week I think it should bleed nearly as much which should help and even more. 02-26-2022 patient appears to be doing well currently in regard to his  wounds in fact the second toe looks like it could potentially even be healed although we will get a monitor 1 more week. The great toe is significantly smaller and is healing in an excellent fashion and very pleased in that regard. Electronic Signature(s) Signed: 02/26/2022 1:36:49 PM By: Richard Keeler PA-C Entered By: [name] on 02/26/2022 13:36:49 [place], [place] ([id number]) [id number] Page 3 of 8 -------------------------------------------------------------------------------- Physical Exam Details Patient Name: Date of Service: [name] LFO RD 02/26/2022 11:30 A M Medical Record Number: [id number] Patient Account Number: [id number] Date of Birth/Sex: Treating RN: December 11, 1940 (81 y.o. M) Primary Care Provider: Mackie Pai Other Clinician: Referring Provider: Treating Provider/Extender: Sheran Lawless in Treatment: 8 Constitutional Well-nourished and well-hydrated in no acute distress. Respiratory normal breathing without difficulty. Psychiatric this patient is able to make decisions and demonstrates good insight into disease process. Alert and Oriented x 3. pleasant and cooperative. Notes Upon inspection patient's wound bed actually showed signs of excellent granulation and epithelization at this point. Overall I am extremely pleased with where we stand I think that he is headed in the right direction and this is great news. Electronic Signature(s) Signed: 02/26/2022 1:37:15 PM By: Richard Keeler PA-C Entered By: [name] on 02/26/2022 13:37:15 -------------------------------------------------------------------------------- Physician Orders Details Patient Name: Date of Service: [name], A LFO RD 02/26/2022 11:30 A M Medical Record Number: [id number] Patient Account Number: [id number] Date of Birth/Sex: Treating RN: 11-14-1940 (81 y.o. 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 - w/ [name] and Lauran Rm # 9 Return A Anesthetic (In clinic) Topical Lidocaine 5% applied to wound bed Bathing/ Shower/ Hygiene May shower with protection but do not get wound dressing(s) wet. Edema Control - Lymphedema / SCD / Other Elevate legs to the level of the heart or above for 30 minutes daily and/or when sitting, a frequency of: Avoid standing for long periods of time. [place], [place] ([id number]) [id number] Page 4 of 8 Off-Loading Other: - Keep pressure off of Left Foot/toes Wound Treatment Wound #1 - T Great oe Wound Laterality: Left Cleanser: Soap and Water 1 x Per Day/15 Days Discharge Instructions: May shower and wash wound with dial antibacterial soap and water prior to dressing change. Cleanser: Wound Cleanser (Generic) 1 x Per Day/15 Days Discharge Instructions: Cleanse the wound with wound cleanser prior to applying a clean dressing using gauze sponges, not tissue or cotton balls. Prim Dressing: Promogran Prisma Matrix, 4.34 (sq in) (silver collagen) 1 x Per Day/15 Days ary Discharge Instructions: Moisten collagen with saline or hydrogel Prim Dressing: Xeroform Occlusive Gauze Dressing, 4x4 in (Generic) 1 x Per Day/15 Days ary Discharge Instructions: Apply to wound bed as  instructed Secondary Dressing: Woven Gauze Sponge, Non-Sterile 4x4 in (Generic) 1 x Per Day/15 Days Discharge Instructions: Apply over primary dressing as  directed. Secured With: Child psychotherapist, Sterile 2x75 (in/in) (Generic) 1 x Per Day/15 Days Discharge Instructions: Secure with stretch gauze as directed. Secured With: 49M Medipore H Soft Cloth Surgical T ape, 4 x 10 (in/yd) (Generic) 1 x Per Day/15 Days Discharge Instructions: Secure with tape as directed. Secured With: [employer] Size 2, 10 (yds) (Generic) 1 x Per Day/15 Days Wound #2 - T Second oe Wound Laterality: Left Cleanser: Soap and Water 1 x Per Day/15 Days Discharge Instructions: May shower and wash wound with dial antibacterial soap and water prior to dressing change. Cleanser: Wound Cleanser (Generic) 1 x Per Day/15 Days Discharge Instructions: Cleanse the wound with wound cleanser prior to applying a clean dressing using gauze sponges, not tissue or cotton balls. Prim Dressing: Xeroform Occlusive Gauze Dressing, 4x4 in (Generic) 1 x Per Day/15 Days ary Discharge Instructions: Apply to wound bed as instructed Secondary Dressing: Woven Gauze Sponge, Non-Sterile 4x4 in (Generic) 1 x Per Day/15 Days Discharge Instructions: Apply over primary dressing as directed. Secured With: Child psychotherapist, Sterile 2x75 (in/in) (Generic) 1 x Per Day/15 Days Discharge Instructions: Secure with stretch gauze as directed. Secured With: 49M Medipore H Soft Cloth Surgical T ape, 4 x 10 (in/yd) (Generic) 1 x Per Day/15 Days Discharge Instructions: Secure with tape as directed. Secured With: [employer] Size 2, 10 (yds) (Generic) 1 x Per Day/15 Days Electronic Signature(s) Signed: 02/26/2022 5:12:24 PM By: Richard Keeler PA-C Signed: 03/03/2022 1:44:39 PM By: Richard Creamer RN, BSN Entered By: [name] on 02/26/2022 12:22:29 -------------------------------------------------------------------------------- Problem List Details Patient Name: Date of Service: [name], A LFO RD 02/26/2022 11:30 A M Medical Record Number: [id number] Patient Account Number:  [id number] Date of Birth/Sex: Treating RN: 1940/07/25 (81 y.o. M) Primary Care Provider: Mackie Pai Other Clinician: Referring Provider: Treating Provider/Extender: Sheran Lawless in Treatment: [place], [place] ([id number]) [id number] Page 5 of 8 ICD-10 Encounter Code Description Active Date MDM Diagnosis I73.89 Other specified peripheral vascular diseases 01/01/2022 No Yes L97.522 Non-pressure chronic ulcer of other part of left foot with fat layer exposed 01/01/2022 No Yes L97.524 Non-pressure chronic ulcer of other part of left foot with necrosis of bone 02/12/2022 No Yes R73.03 Prediabetes 01/01/2022 No Yes I10 Essential (primary) hypertension 01/01/2022 No Yes Inactive Problems Resolved Problems Electronic Signature(s) Signed: 02/26/2022 11:49:41 AM By: Richard Keeler PA-C Entered By: [name] on 02/26/2022 11:49:41 -------------------------------------------------------------------------------- Progress Note Details Patient Name: Date of Service: [name], A LFO RD 02/26/2022 11:30 A M Medical Record Number: [id number] Patient Account Number: [id number] Date of Birth/Sex: Treating RN: February 15, 1941 (81 y.o. M) Primary Care Provider: Mackie Pai Other Clinician: Referring Provider: Treating Provider/Extender: Sheran Lawless in Treatment: 8 Subjective Chief Complaint Information obtained from Patient Left 1st and 2nd toe ulcers History of Present Illness (HPI) 01-01-2022 upon evaluation today patient appears to be doing poorly in regard to her wound that is on the left great toe and second toe location. The toenail of the second toe covers over a portion of the wound which I am concerned may be hiding a piece of exposed bone based on what I see today. The patient does have a history of peripheral vascular disease he is a patient of Dr. Alvester Chou who did an abdominal aortogram  previous and was  unable to intervene with regard to the lower extremity blood flow. The patient had a right ABI which was noncompressible and a left ABI was 0.6 with TBI's of 0.26 unfortunately. The TBI on the right was 0.62 which was not nearly as bad. Nonetheless the unfortunate thing is that with poor blood flow and potential for bone exposure this becomes increasingly complicated if the patient does have osteomyelitis to get this to heal. He does have repeat vascular testing on Monday upcoming. The patient is stated to be "prediabetic" according to notes and the patient. Currently he has been on Keflex. Patient has a history of peripheral vascular disease, prediabetes, hypertension, and unfortunately the current wound which has been present for 4 weeks based on what they tell me today. 01-08-2022 upon evaluation today patient appears to be doing about the same in regard to his wound. Again the x-ray was negative that was performed on 17th everything looks to be okay. With that being said he is continuing to have issues here with poor blood flow and perfusion he did have a repeat arterial study and ABI with TBI TBI could not even be obtained because the signals were so dampened. Previously he had been at 0.2. In regard to the ABI was previously around 0.65 he was now around 0.45. Again this was definitely a decrease on the left side compared to previous the right was stable. He is stated to have critical limb ischemia. 01-15-2022 upon evaluation today patient appears to be doing well currently in regard to his wounds all things considered. Were seeing more growth on the big toe versus the second toe but this is to be expected at the second toe does have some bone exposed. Fortunately I do not see any evidence of active infection at this time. He does have an appointment scheduled for September 11 which is Monday for an arteriogram and then subsequently the 12th for bypass surgery. Subsequently I  think this is can be very good for him as far as trying to get this healed I believe it is good to be the best plan that we have as far as getting good blood flow to allow for healing to continue. Once we get good blood flow I think we can then clean away some of the necrotic bone if [place], [place] ([id number]) [id number] Page 6 of 8 necessary to try and allow this to heal more effectively. We also discussed briefly hyperbaric oxygen therapy and the possibility of distal toe amputation if need be but again right now we are still on the plantar stages of even knowing what we can do from a wound care perspective which I think is the first initial goal here. 01-22-2022 upon evaluation today patient appears to be doing better currently in regard to his wounds all things considered. He actually is good to be having his vascular procedures upcoming next week. Nonetheless I think once we get better blood flow will be able to see where things stand. He is also going to have his MRI on Sunday which I think will also be very beneficial for [place] to know exactly what is going on with the second toe in particular and the way we will get a proceed following. 02-12-2022 upon evaluation today patient appears to be doing well currently in regard to his wounds in fact he is showing signs of significant improvement which is great news. Fortunately I see no evidence of active infection locally or systemically at this time which is great news  he has had his bypass on the left lower extremity and this does seem to be doing much better compared to what we were previous. He is in fact about twice a small on the great toe as it was last time I saw him I am very pleased with where things stand with the second toe as well though there is still bone exposed there seems to be excellent blood flow which I think in turn is can allow this to actually heal that is what we want. 02-19-2022 upon evaluation today  patient appears to be doing excellent in regard to his wounds. He is going require some sharp debridement but to be honest I think he should really do quite well with this. He did have a lot more bleeding from the second toe after I did clean away some of the necrotic bone last week this week I think it should bleed nearly as much which should help and even more. 02-26-2022 patient appears to be doing well currently in regard to his wounds in fact the second toe looks like it could potentially even be healed although we will get a monitor 1 more week. The great toe is significantly smaller and is healing in an excellent fashion and very pleased in that regard. Objective Constitutional Well-nourished and well-hydrated in no acute distress. Vitals Time Taken: 12:07 PM, Temperature: 97.8 F, Pulse: 69 bpm, Respiratory Rate: 18 breaths/min, Blood Pressure: 128/68 mmHg. Respiratory normal breathing without difficulty. Psychiatric this patient is able to make decisions and demonstrates good insight into disease process. Alert and Oriented x 3. pleasant and cooperative. General Notes: Upon inspection patient's wound bed actually showed signs of excellent granulation and epithelization at this point. Overall I am extremely pleased with where we stand I think that he is headed in the right direction and this is great news. Integumentary (Hair, Skin) Wound #1 status is Open. Original cause of wound was Trauma. The date acquired was: 12/01/2021. The wound has been in treatment 8 weeks. The wound is located on the Left T Great. The wound measures 4.2cm length x 0.9cm width x 0.1cm depth; 2.969cm^2 area and 0.297cm^3 volume. There is Fat Layer oe (Subcutaneous Tissue) exposed. There is no tunneling or undermining noted. There is a medium amount of serosanguineous drainage noted. The wound margin is distinct with the outline attached to the wound base. There is medium (34-66%) red, pink granulation within the  wound bed. There is a medium (34-66%) amount of necrotic tissue within the wound bed. The periwound skin appearance had no abnormalities noted for texture. The periwound skin appearance had no abnormalities noted for moisture. The periwound skin appearance had no abnormalities noted for color. Periwound temperature was noted as No Abnormality. Wound #2 status is Open. Original cause of wound was Trauma. The date acquired was: 12/01/2021. The wound has been in treatment 8 weeks. The wound is located on the Left T Second. The wound measures 0.2cm length x 0.2cm width x 0.2cm depth; 0.031cm^2 area and 0.006cm^3 volume. There is Fat Layer oe (Subcutaneous Tissue) exposed. There is no tunneling noted, however, there is undermining starting at 12:00 and ending at 12:00 with a maximum distance of 0.3cm. There is a medium amount of serosanguineous drainage noted. The wound margin is distinct with the outline attached to the wound base. There is medium (34-66%) red, pink granulation within the wound bed. There is a medium (34-66%) amount of necrotic tissue within the wound bed. The periwound skin appearance had no abnormalities  noted for moisture. The periwound skin appearance had no abnormalities noted for color. The periwound skin appearance exhibited: Callus. The periwound skin appearance did not exhibit: Crepitus, Excoriation, Induration, Rash, Scarring. Periwound temperature was noted as No Abnormality. 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 There was a Selective/Open Wound Non-Viable Tissue Debridement with a total area of 0.25 sq cm performed by Richard Keeler, PA. With the following instrument(s): Curette to remove Non-Viable tissue/material. Material removed includes Callus after achieving pain control using Lidocaine 5% topical ointment. No specimens were taken. A time out was conducted at 12:18, prior to the start of the procedure. A Minimum amount of bleeding was controlled with Pressure. The procedure was tolerated well with a pain level of 0 throughout and a pain level of 0 following the procedure. Post Debridement Measurements: 0.5cm length x 0.5cm width x 0.2cm depth; 0.039cm^3 volume. Character of Wound/Ulcer Post Debridement is improved. Severity of Tissue Post Debridement is: Fat layer exposed. Post procedure Diagnosis Wound #2: Same as Pre-Procedure Plan Follow-up Appointments: Return Appointment in 1 week. - w/ [name] and Lauran Rm # 9 Anesthetic: (In clinic) Topical Lidocaine 5% applied to wound bed Bathing/ Shower/ Hygiene: May shower with protection but do not get wound dressing(s) wet. Edema Control - Lymphedema / SCD / Other: Elevate legs to the level of the heart or above for 30 minutes daily and/or when sitting, a frequency of: Avoid standing for long periods of time. Off-Loading: Other: - Keep pressure off of Left Foot/toes WOUND #1: - T Great Wound Laterality: Left oe Cleanser: Soap and Water 1 x Per Day/15 Days Discharge Instructions: May shower and wash wound with dial antibacterial soap and water prior to dressing change. Cleanser: Wound Cleanser (Generic) 1 x Per Day/15 Days Discharge Instructions: Cleanse the wound with wound cleanser prior to applying a clean dressing using gauze sponges, not tissue or cotton balls. Prim Dressing: Promogran Prisma Matrix, 4.34 (sq in) (silver collagen) 1 x Per Day/15 Days ary Discharge  Instructions: Moisten collagen with saline or hydrogel Prim Dressing: Xeroform Occlusive Gauze Dressing, 4x4 in (Generic) 1 x Per Day/15 Days ary Discharge Instructions: Apply to wound bed as instructed Secondary Dressing: Woven Gauze Sponge, Non-Sterile 4x4 in (Generic) 1 x Per Day/15 Days Discharge Instructions: Apply over primary dressing as directed. Secured With: Child psychotherapist, Sterile 2x75 (in/in) (Generic) 1 x Per Day/15 Days Discharge Instructions: Secure with stretch gauze as directed. Secured With: 35M Medipore H Soft Cloth Surgical T ape, 4 x 10 (in/yd) (Generic) 1 x Per Day/15 Days Discharge Instructions: Secure with tape as directed. Secured With: [employer] Size 2, 10 (yds) (Generic) 1 x Per Day/15 Days WOUND #2: - T Second Wound Laterality: Left oe Cleanser: Soap and Water 1 x Per Day/15 Days Discharge Instructions: May shower and wash wound with dial antibacterial soap and water prior to dressing change. Cleanser: Wound Cleanser (Generic) 1 x Per Day/15 Days Discharge Instructions: Cleanse the wound with wound cleanser prior to applying a clean dressing using gauze sponges, not tissue or cotton balls. Prim Dressing: Xeroform Occlusive Gauze  Dressing, 4x4 in (Generic) 1 x Per Day/15 Days ary Discharge Instructions: Apply to wound bed as instructed Secondary Dressing: Woven Gauze Sponge, Non-Sterile 4x4 in (Generic) 1 x Per Day/15 Days Discharge Instructions: Apply over primary dressing as directed. Secured With: Child psychotherapist, Sterile 2x75 (in/in) (Generic) 1 x Per Day/15 Days Discharge Instructions: Secure with stretch gauze as directed. Secured With: 56M Medipore H Soft Cloth Surgical T ape, 4 x 10 (in/yd) (Generic) 1 x Per Day/15 Days Discharge Instructions: Secure with tape as directed. Secured With: Stretch Net Size 2, 10 (yds) (Generic) 1 x Per Day/15 Days 1. I am going to suggest that we have the patient continue with the silver  collagen followed by the Xeroform gauze to the great toe area. I think this is doing quite well. 2. T the second toe I I am going to recommend we just using Xeroform gauze. o 3. I am going to suggest as well that he continue with the offloading shoe which I think is doing quite well overall I am extremely pleased with where we stand today. We will see patient back for reevaluation in 1 week here in the clinic. If anything worsens or changes patient will contact our office for additional recommendations. Electronic Signature(s) Signed: 02/26/2022 1:47:04 PM By: Richard Keeler PA-C Entered By: [name] on 02/26/2022 13:47:03 -------------------------------------------------------------------------------- SuperBill Details Patient Name: Date of Service: [name], A LFO RD 02/26/2022 Medical Record Number: [id number] Patient Account Number: [id number] Date of Birth/Sex: Treating RN: [birth year] (81 y.o. 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 with necrosis of bone R73.03 Prediabetes I10 Essential (primary) hypertension Facility Procedures : CPT4 Code: [id number] Description: [id number] - DEBRIDE WOUND 1ST 20 SQ CM OR < ICD-10 Diagnosis Description L97.524 Non-pressure chronic ulcer of other part of left foot with necrosis of bone Modifier: Quantity: 1 Physician Procedures : CPT4 Code Description Modifier [id number] - WC PHYS DEBR WO ANESTH 20 SQ CM ICD-10 Diagnosis Description L97.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

## 2022-03-03 ENCOUNTER — Telehealth: Payer: Self-pay

## 2022-03-03 NOTE — Telephone Encounter (Signed)
Pt's wife, Barnetta Chapel, called stating that on the lower L incision there was a dark color and the incision was hardened.  Reviewed pt's chart, returned call for clarification, two identifiers used. She denies any pain or drainage. She states there is a small bruise at the area. Asked her to monitor it and call if symptoms worsen. Based on her description, it may be that there is scar issue forming. Confirmed understanding.

## 2022-03-05 ENCOUNTER — Encounter (HOSPITAL_BASED_OUTPATIENT_CLINIC_OR_DEPARTMENT_OTHER): Payer: Medicare HMO | Admitting: Internal Medicine

## 2022-03-05 DIAGNOSIS — E11621 Type 2 diabetes mellitus with foot ulcer: Secondary | ICD-10-CM | POA: Diagnosis not present

## 2022-03-06 NOTE — Progress Notes (Signed)
[place], [place] ([id number]) [id number] Page 1 of 10 Visit Report for 03/05/2022 Arrival Information Details Patient Name: Date of Service: [name] [facility] [address] 03/05/2022 9:15 A M Medical Record Number: [id number] Patient Account Number: [id number] Date of Birth/Sex: Treating RN: 1940-05-30 (81 y.o. M) Primary Care Koren Plyler: Mackie Pai Other Clinician: Referring Atilla Zollner: Treating Tereka Thorley/Extender: Erick Blinks in Treatment: 9 Visit Information History Since Last Visit Added or deleted any medications: No Patient Arrived: Ambulatory Any new allergies or adverse reactions: No Arrival Time: 09:08 Had a fall or experienced change in No Accompanied By: wife activities of daily living that may affect Transfer Assistance: None risk of falls: Patient Identification Verified: Yes Signs or symptoms of abuse/neglect since last visito No Secondary Verification Process Completed: Yes Hospitalized since last visit: No Patient Requires Transmission-Based Precautions: No Implantable device outside of the clinic excluding No Patient Has Alerts: Yes cellular tissue based products placed in the center Patient Alerts: ABI's:02/23 R: N/C L:0.6 since last visit: TBI's:02/23 R:0.62L:0.26 Has Dressing in Place as Prescribed: Yes Pain Present Now: No Electronic Signature(s) Signed: 03/05/2022 4:47:05 PM By: Erenest Blank Entered By: Erenest Blank on 03/05/2022 09:08:35 -------------------------------------------------------------------------------- Clinic Level of Care Assessment Details Patient Name: Date of Service: [name] LFO RD 03/05/2022 9:15 A M Medical Record Number: [id number] Patient Account Number: [id number] Date of Birth/Sex: Treating RN: 11-Jan-1941 (81 y.o. Erie Noe Primary Care Milayna Rotenberg: Mackie Pai Other Clinician: Referring Anterrio Mccleery: Treating Jonatan Wilsey/Extender: Erick Blinks in  Treatment: 9 Clinic Level of Care Assessment Items TOOL 4 Quantity Score X- 1 0 Use when only an EandM is performed on FOLLOW-UP visit ASSESSMENTS - Nursing Assessment / Reassessment X- 1 10 Reassessment of Co-morbidities (includes updates in patient status) X- 1 5 Reassessment of Adherence to Treatment Plan ASSESSMENTS - Wound and Skin A ssessment / Reassessment []  - 0 Simple Wound Assessment / Reassessment - one wound X- 2 5 Complex Wound Assessment / Reassessment - multiple wounds []  - 0 Dermatologic / Skin Assessment (not related to wound area) ASSESSMENTS - Focused Assessment X- 1 5 Circumferential Edema Measurements - multi extremities []  - 0 Nutritional Assessment / Counseling / [employer], [name] ([id number]) [id number] Page 2 of 10 []  - 0 Lower Extremity Assessment (monofilament, tuning fork, pulses) []  - 0 Peripheral Arterial Disease Assessment (using hand held doppler) ASSESSMENTS - Ostomy and/or Continence Assessment and Care []  - 0 Incontinence Assessment and Management []  - 0 Ostomy Care Assessment and Management (repouching, etc.) PROCESS - Coordination of Care []  - 0 Simple Patient / Family Education for ongoing care X- 1 20 Complex (extensive) Patient / Family Education for ongoing care X- 1 10 Staff obtains Programmer, systems, Records, T Results / Process Orders est []  - 0 Staff telephones HHA, Nursing Homes / Clarify orders / etc []  - 0 Routine Transfer to another Facility (non-emergent condition) []  - 0 Routine Hospital Admission (non-emergent condition) []  - 0 New Admissions / Biomedical engineer / Ordering NPWT Apligraf, etc. , []  - 0 Emergency Hospital Admission (emergent condition) []  - 0 Simple Discharge Coordination X- 1 15 Complex (extensive) Discharge Coordination PROCESS - Special Needs []  - 0 Pediatric / Minor Patient Management []  - 0 Isolation Patient Management []  - 0 Hearing / Language / Visual  special needs []  - 0 Assessment of Community assistance (transportation, D/C planning, etc.) []  - 0 Additional assistance / Altered mentation []  - 0 Support Surface(s) Assessment (bed, cushion, seat, etc.) INTERVENTIONS - Wound Cleansing /  Measurement []  - 0 Simple Wound Cleansing - one wound X- 2 5 Complex Wound Cleansing - multiple wounds X- 1 5 Wound Imaging (photographs - any number of wounds) []  - 0 Wound Tracing (instead of photographs) []  - 0 Simple Wound Measurement - one wound X- 2 5 Complex Wound Measurement - multiple wounds INTERVENTIONS - Wound Dressings []  - 0 Small Wound Dressing one or multiple wounds X- 2 15 Medium Wound Dressing one or multiple wounds []  - 0 Large Wound Dressing one or multiple wounds X- 1 5 Application of Medications - topical []  - 0 Application of Medications - injection INTERVENTIONS - Miscellaneous []  - 0 External ear exam []  - 0 Specimen Collection (cultures, biopsies, blood, body fluids, etc.) []  - 0 Specimen(s) / Culture(s) sent or taken to Lab for analysis []  - 0 Patient Transfer (multiple staff / / Similar devices) []  - 0 Simple Staple / Suture removal (25 or less) []  - 0 Complex Staple / Suture removal (26 or more) []  - 0 Hypo / Hyperglycemic Management (close monitor of Blood Glucose) Lloyd, Richard ( ) [id number] Page 3 of 10 X- 1 15 Ankle / Brachial Index (ABI) - do not check if billed separately X- 1 5 Vital Signs Has the patient been seen at the hospital within the last three years: Yes Total Score: 155 Level Of Care: New/Established - Level 4 Electronic Signature(s) Signed: 03/06/2022 4:05:37 PM By: RN Entered By: on 03/05/2022 10:04:38 -------------------------------------------------------------------------------- Encounter Discharge Information Details Patient Name: Date of Service: , A LFO RD 03/05/2022 9:15 A  M Medical Record Number: Patient Account Number: Nurse, adult Date of Birth/Sex: Treating RN: 08/09/1940 (81 y.o. , Richard Primary Care Yaslyn Cumby: [id number] Other Clinician: Referring Jerol Rufener: Treating Wenda Vanschaick/Extender: 06-26-1985 in Treatment: 9 Encounter Discharge Information Items Discharge Condition: Stable Ambulatory Status: Ambulatory Discharge Destination: Home Transportation: Private Auto Accompanied By: wife Schedule Follow-up Appointment: Yes Clinical Summary of Care: Patient Declined Electronic Signature(s) Signed: 03/06/2022 4:05:37 PM By: [name] RN Entered By: [name] on 03/05/2022 10:05:12 -------------------------------------------------------------------------------- Lower Extremity Assessment Details Patient Name: Date of Service: [name], A LFO RD 03/05/2022 9:15 A M Medical Record Number: [id number] Patient Account Number: [id number] Date of Birth/Sex: Treating RN: 05-23-1940 (81 y.o. M) Primary Care Ruvim Risko: [name] Other Clinician: Referring Sarahanne Novakowski: Treating Delanee Xin/Extender: [name] in Treatment: 9 Edema Assessment Assessed: Edwyna Ready: No] 03/08/2022: No] Edema: [Left: Ye] [Right: s] Calf Left: Right: Point of Measurement: 37 cm From Medial Instep 40.7 cm Ankle Left: Right: Point of Measurement: 10 cm From Medial Instep 25.6 cm Electronic Signature(s) Signed: 03/05/2022 4:47:05 PM By: [name], Gerber 03/05/2022 4:47:05 PM By: [name] Signed: (03/07/2022) [id number] Page 4 of 10 Entered By: [id number] on 03/05/2022 09:23:16 -------------------------------------------------------------------------------- Multi Wound Chart Details Patient Name: Date of Service: [address] [facility] RD 03/05/2022 9:15 A M Medical Record Number: Kyra Searles Patient Account Number: Franne Forts Date of Birth/Sex: Treating  RN: [birth year] (81 y.o. M) Primary Care Nikolay Demetriou: 03/07/2022 Other Clinician: Referring Nissa Stannard: Treating Sameka Bagent/Extender: Thayer [place] in Treatment: 9 Vital Signs Height(in): Pulse(bpm): 63 Weight(lbs): Blood Pressure(mmHg): 108/63 Body Mass Index(BMI): Temperature(F): 97.8 Respiratory Rate(breaths/min): 18 [1:Photos:] [N/A:N/A] Left T Great oe Left T Second oe N/A Wound Location: Trauma Trauma N/A Wounding Event: Arterial Insufficiency Ulcer Arterial Insufficiency Ulcer N/A Primary Etiology: Coronary Artery Disease, Coronary Artery Disease, N/A Comorbid History: Hypotension, Peripheral Arterial Hypotension, Peripheral  Arterial Disease, Peripheral Venous Disease, Disease, Peripheral Venous Disease, Type II Diabetes, Osteoarthritis Type II Diabetes, Osteoarthritis 12/01/2021 12/01/2021 N/A Date Acquired: 9 9 N/A Weeks of Treatment: Open Healed - Epithelialized N/A Wound Status: No No N/A Wound Recurrence: 1x0.6x0.1 0x0x0 N/A Measurements L x W x D (cm) 0.471 0 N/A A (cm) : rea 0.047 0 N/A Volume (cm) : 95.20% 100.00% N/A % Reduction in Area: 97.60% 100.00% N/A % Reduction in Volume: Full Thickness With Exposed Support Full Thickness With Exposed Support N/A Classification: Structures Structures Medium Medium N/A Exudate Amount: Serosanguineous Serosanguineous N/A Exudate Type: red, brown red, brown N/A Exudate Color: Distinct, outline attached Distinct, outline attached N/A Wound Margin: Medium (34-66%) Medium (34-66%) N/A Granulation Amount: Red, Pink Red, Pink N/A Granulation Quality: Medium (34-66%) Medium (34-66%) N/A Necrotic Amount: Fat Layer (Subcutaneous Tissue): Yes Fat Layer (Subcutaneous Tissue): Yes N/A Exposed Structures: Fascia: No Fascia: No Tendon: No Tendon: No Muscle: No Muscle: No Joint: No Joint: No Bone: No Bone: No Medium (34-66%) None N/A Epithelialization: No Abnormalities Noted  Callus: Yes N/A Periwound Skin Texture: Excoriation: No Induration: No Crepitus: No Rash: No Scarring: No No Abnormalities Noted Maceration: No N/A Periwound Skin Moisture: Dry/Scaly: No No Abnormalities Noted Atrophie Blanche: No N/A Periwound Skin Color: Cyanosis: No Ecchymosis: No Orrison, Sahand ([id number]) [id number] Page 5 of 10 Erythema: No Hemosiderin Staining: No Mottled: No Pallor: No Rubor: No No Abnormality No Abnormality N/A Temperature: Treatment Notes Electronic Signature(s) Signed: 03/05/2022 4:28:13 PM By: Baltazar Najjarobson, Michael MD Entered By: Baltazar Najjarobson, Michael on 03/05/2022 09:48:23 -------------------------------------------------------------------------------- Multi-Disciplinary Care Plan Details Patient Name: Date of Service: Richard BaldyWO O D, A LFO RD 03/05/2022 9:15 A M Medical Record Number: [id number] Patient Account Number: [id number] Date of Birth/Sex: Treating RN: March 30, 1941 (81 y.o. Charlean MerlM) Breedlove, Richard Primary Care Shonn Farruggia: Esperanza RichtersSaguier, Edward Other Clinician: Referring Meeghan Skipper: Treating Danilo Cappiello/Extender: Edwyna Readyobson, Michael Saguier, Edward Weeks in Treatment: 9 Active Inactive Tissue Oxygenation Nursing Diagnoses: Actual ineffective tissue perfusion; peripheral (select once diagnosis is confirmed) Knowledge deficit related to disease process and management Potential alteration in peripheral tissue perfusion (select prior to confirmation of diagnosis) Goals: Invasive arterial studies completed as ordered Date Initiated: 01/01/2022 Target Resolution Date: 03/15/2022 Goal Status: Active Non-invasive arterial studies are completed as ordered Date Initiated: 01/01/2022 Target Resolution Date: 03/15/2022 Goal Status: Active Patient/caregiver will verbalize understanding of disease process and disease management Date Initiated: 01/01/2022 Target Resolution Date: 03/15/2022 Goal Status: Active Interventions: Assess patient understanding of  disease process and management upon diagnosis and as needed Assess peripheral arterial status upon admission and as needed Provide education on tissue oxygenation and ischemia Notes: Wound/Skin Impairment Nursing Diagnoses: Impaired tissue integrity Knowledge deficit related to ulceration/compromised skin integrity Goals: Patient will have a decrease in wound volume by X% from date: (specify in notes) Date Initiated: 01/01/2022 Target Resolution Date: 03/15/2022 Goal Status: Active Patient/caregiver will verbalize understanding of skin care regimen Date Initiated: 01/01/2022 Target Resolution Date: 03/15/2022 Goal Status: Active Ulcer/skin breakdown will have a volume reduction of 30% by week 4 Date Initiated: 01/01/2022 Target Resolution Date: 03/15/2022 Goal Status: [place] Xu, Kimmy ([id number]) [id number] Page 6 of 10 Interventions: Assess patient/caregiver ability to obtain necessary supplies Assess patient/caregiver ability to perform ulcer/skin care regimen upon admission and as needed Assess ulceration(s) every visit Notes: Electronic Signature(s) Signed: 03/06/2022 4:05:37 PM By: Fonnie MuBreedlove, [name] RN Entered By: Fonnie MuBreedlove, Richard on 03/05/2022 09:56:39 -------------------------------------------------------------------------------- Pain Assessment Details Patient Name: Date of Service: Richard BaldyWO O D, A LFO RD 03/05/2022 9:15 A M Medical Record Number: [id number] Patient  Account Number: [id number] Date of Birth/Sex: Treating RN: 02-09-1941 (81 y.o. M) Primary Care Zymere Patlan: [name] Other Clinician: Referring Camryn Lampson: Treating Temika Sutphin/Extender: Edwyna Ready in Treatment: 9 Active Problems Location of Pain Severity and Description of Pain Patient Has Paino No Site Locations Pain Management and Medication Current Pain Management: Electronic Signature(s) Signed: 03/05/2022 4:47:05 PM By: Thayer [place] Entered  By: Thayer [place] on 03/05/2022 09:10:50 -------------------------------------------------------------------------------- Patient/Caregiver Education Details Patient Name: Date of Service: Evelina Bucy LFO RD 10/18/2023andnbsp9:15 A M Medical Record Number: [id number] Patient Account Number: [id number] Date of Birth/Gender: Treating RN: [birth year] (81 y.o. [name] Primary Care Physician: [name] Other Clinician: [name] ([id number]) [id number] Page 7 of 10 Referring Physician: Treating Physician/Extender: Edwyna Ready in Treatment: 9 Education Assessment Education Provided To: Patient Education Topics Provided Tissue Oxygenation: Methods: Explain/Verbal Responses: State content correctly Electronic Signature(s) Signed: 03/06/2022 4:05:37 PM By: [name] RN Entered By: [name] on 03/05/2022 09:58:45 -------------------------------------------------------------------------------- Wound Assessment Details Patient Name: Date of Service: [name], A LFO RD 03/05/2022 9:15 A M Medical Record Number: [id number] Patient Account Number: [id number] Date of Birth/Sex: Treating RN: June 21, 1940 (81 y.o. M) Primary Care Haru Anspaugh: [name] Other Clinician: Referring Shaylah Mcghie: Treating Krystale Rinkenberger/Extender: Edwyna Ready in Treatment: 9 Wound Status Wound Number: 1 Primary Arterial Insufficiency Ulcer Etiology: Wound Location: Left T Great oe Wound Open Wounding Event: Trauma Status: Date Acquired: 12/01/2021 Comorbid Coronary Artery Disease, Hypotension, Peripheral Arterial Disease, Weeks Of Treatment: 9 History: Peripheral Venous Disease, Type II Diabetes, Osteoarthritis Clustered Wound: No Photos Wound Measurements Length: (cm) 1 Width: (cm) 0.6 Depth: (cm) 0.1 Area: (cm) 0.471 Volume: (cm) 0.047 % Reduction in Area: 95.2% % Reduction in Volume:  97.6% Epithelialization: Medium (34-66%) Tunneling: No Undermining: No Wound Description Classification: Full Thickness With Exposed Suppor Wound Margin: Distinct, outline attached Exudate Amount: Medium Exudate Type: Serosanguineous Exudate Color: red, brown t Structures Foul Odor After Cleansing: No Slough/Fibrino Yes Wound Bed Calwa, Jayton ([id number]) [id number] Page 8 of 10 Granulation Amount: Medium (34-66%) Exposed Structure Granulation Quality: Red, Pink Fascia Exposed: No Necrotic Amount: Medium (34-66%) Fat Layer (Subcutaneous Tissue) Exposed: Yes Tendon Exposed: No Muscle Exposed: No Joint Exposed: No Bone Exposed: No Periwound Skin Texture Texture Color No Abnormalities Noted: Yes No Abnormalities Noted: Yes Moisture Temperature / Pain No Abnormalities Noted: Yes Temperature: No Abnormality Treatment Notes Wound #1 (Toe Great) Wound Laterality: Left Cleanser Soap and Water Discharge Instruction: May shower and wash wound with dial antibacterial soap and water prior to dressing change. Wound Cleanser Discharge Instruction: Cleanse the wound with wound cleanser prior to applying a clean dressing using gauze sponges, not tissue or cotton balls. Peri-Wound Care Topical Primary Dressing Promogran Prisma Matrix, 4.34 (sq in) (silver collagen) Discharge Instruction: Moisten collagen with saline or hydrogel Xeroform Occlusive Gauze Dressing, 4x4 in Discharge Instruction: Apply to wound bed as instructed Secondary Dressing Woven Gauze Sponge, Non-Sterile 4x4 in Discharge Instruction: Apply over primary dressing as directed. Secured With Conforming Stretch Gauze Bandage, Sterile 2x75 (in/in) Discharge Instruction: Secure with stretch gauze as directed. 26M Medipore H Soft Cloth Surgical T ape, 4 x 10 (in/yd) Discharge Instruction: Secure with tape as directed. Stretch Net Size 2, 10 (yds) Compression Wrap Compression  Stockings Add-Ons Electronic Signature(s) Signed: 03/05/2022 4:47:05 PM By: Thayer [place] Entered By: Thayer [place] on 03/05/2022 09:25:07 -------------------------------------------------------------------------------- Wound Assessment Details Patient Name: Date of Service: [name], A LFO RD 03/05/2022 9:15 A M Medical Record Number:  [id number] Patient Account Number: [id number] Date of Birth/Sex: Treating RN: [birth year] (81 y.o. [name] Primary Care Lyndsey Demos: [name] Other Clinician: Referring Bethany Cumming: Treating Edgardo Petrenko/Extender: Edwyna Ready in Treatment: 9 Wound Status Wound Number: 2 Primary Arterial Insufficiency Ulcer Etiology: SAMANYU, TINNELL ([id number]) [id number] Page 9 of 10 Etiology: Wound Location: Left T Second oe Wound Healed - Epithelialized Wounding Event: Trauma Status: Date Acquired: 12/01/2021 Comorbid Coronary Artery Disease, Hypotension, Peripheral Arterial Disease, Weeks Of Treatment: 9 History: Peripheral Venous Disease, Type II Diabetes, Osteoarthritis Clustered Wound: No Photos Wound Measurements Length: (cm) Width: (cm) Depth: (cm) Area: (cm) Volume: (cm) 0 % Reduction in Area: 100% 0 % Reduction in Volume: 100% 0 Epithelialization: None 0 Tunneling: No 0 Undermining: No Wound Description Classification: Full Thickness With Exposed Suppor Wound Margin: Distinct, outline attached Exudate Amount: Medium Exudate Type: Serosanguineous Exudate Color: red, brown t Structures Foul Odor After Cleansing: No Slough/Fibrino Yes Wound Bed Granulation Amount: Medium (34-66%) Exposed Structure Granulation Quality: Red, Pink Fascia Exposed: No Necrotic Amount: Medium (34-66%) Fat Layer (Subcutaneous Tissue) Exposed: Yes Tendon Exposed: No Muscle Exposed: No Joint Exposed: No Bone Exposed: No Periwound Skin Texture Texture Color No Abnormalities Noted: No No  Abnormalities Noted: Yes Callus: Yes Temperature / Pain Crepitus: No Temperature: No Abnormality Excoriation: No Induration: No Rash: No Scarring: No Moisture No Abnormalities Noted: Yes Treatment Notes Wound #2 (Toe Second) Wound Laterality: Left Cleanser Peri-Wound Care Topical Primary Dressing Secondary Dressing Secured With Compression Wrap Compression Stockings Add-Ons [place], [place] ([id number]) [id number] Page 10 of 10 Electronic Signature(s) Signed: 03/06/2022 4:05:37 PM By: [name] RN Entered By: [name] on 03/05/2022 09:42:02 -------------------------------------------------------------------------------- Vitals Details Patient Name: Date of Service: [name], A LFO RD 03/05/2022 9:15 A M Medical Record Number: [id number] Patient Account Number: [id number] Date of Birth/Sex: Treating RN: 1940-12-11 (81 y.o. M) Primary Care Ravyn Nikkel: [name] Other Clinician: Referring Torrion Witter: Treating Orbin Mayeux/Extender: Edwyna Ready in Treatment: 9 Vital Signs Time Taken: 09:10 Temperature (F): 97.8 Pulse (bpm): 63 Respiratory Rate (breaths/min): 18 Blood Pressure (mmHg): 108/63 Reference Range: 80 - 120 mg / dl Electronic Signature(s) Signed: 03/05/2022 4:47:05 PM By: Thayer [place] Entered By: Thayer [place] on 03/05/2022 09:10:38

## 2022-03-06 NOTE — Progress Notes (Signed)
[place], [place] ([id number]) [id number] Page 1 of 6 Visit Report for 03/05/2022 HPI Details Patient Name: Date of Service: [name] LFO RD 03/05/2022 9:15 A M Medical Record Number: [id number] Patient Account Number: [id number] Date of Birth/Sex: Treating RN: 27-May-1940 (81 y.o. M) Primary Care Provider: [name] Other Clinician: Referring Provider: Treating Provider/Extender: Edwyna Ready in Treatment: 9 History of Present Illness HPI Description: 01-01-2022 upon evaluation today patient appears to be doing poorly in regard to her wound that is on the left great toe and second toe location. The toenail of the second toe covers over a portion of the wound which I am concerned may be hiding a piece of exposed bone based on what I see today. The patient does have a history of peripheral vascular disease he is a patient of Dr. Gery Pray who did an abdominal aortogram previous and was unable to intervene with regard to the lower extremity blood flow. The patient had a right ABI which was noncompressible and a left ABI was 0.6 with TBI's of 0.26 unfortunately. The TBI on the right was 0.62 which was not nearly as bad. Nonetheless the unfortunate thing is that with poor blood flow and potential for bone exposure this becomes increasingly complicated if the patient does have osteomyelitis to get this to heal. He does have repeat vascular testing on Monday upcoming. The patient is stated to be "prediabetic" according to notes and the patient. Currently he has been on Keflex. Patient has a history of peripheral vascular disease, prediabetes, hypertension, and unfortunately the current wound which has been present for 4 weeks based on what they tell me today. 01-08-2022 upon evaluation today patient appears to be doing about the same in regard to his wound. Again the x-ray was negative that was performed on 17th everything looks to be okay. With that  being said he is continuing to have issues here with poor blood flow and perfusion he did have a repeat arterial study and ABI with TBI TBI could not even be obtained because the signals were so dampened. Previously he had been at 0.2. In regard to the ABI was previously around 0.65 he was now around 0.45. Again this was definitely a decrease on the left side compared to previous the right was stable. He is stated to have critical limb ischemia. 01-15-2022 upon evaluation today patient appears to be doing well currently in regard to his wounds all things considered. Were seeing more growth on the big toe versus the second toe but this is to be expected at the second toe does have some bone exposed. Fortunately I do not see any evidence of active infection at this time. He does have an appointment scheduled for September 11 which is Monday for an arteriogram and then subsequently the 12th for bypass surgery. Subsequently I think this is can be very good for him as far as trying to get this healed I believe it is good to be the best plan that we have as far as getting good blood flow to allow for healing to continue. Once we get good blood flow I think we can then clean away some of the necrotic bone if necessary to try and allow this to heal more effectively. We also discussed briefly hyperbaric oxygen therapy and the possibility of distal toe amputation if need be but again right now we are still on the plantar stages of even knowing what we can do from a wound care perspective which I  think is the first initial goal here. 01-22-2022 upon evaluation today patient appears to be doing better currently in regard to his wounds all things considered. He actually is good to be having his vascular procedures upcoming next week. Nonetheless I think once we get better blood flow will be able to see where things stand. He is also going to have his MRI on Sunday which I think will also be very beneficial for [place] to  know exactly what is going on with the second toe in particular and the way we will get a proceed following. 02-12-2022 upon evaluation today patient appears to be doing well currently in regard to his wounds in fact he is showing signs of significant improvement which is great news. Fortunately I see no evidence of active infection locally or systemically at this time which is great news he has had his bypass on the left lower extremity and this does seem to be doing much better compared to what we were previous. He is in fact about twice a small on the great toe as it was last time I saw him I am very pleased with where things stand with the second toe as well though there is still bone exposed there seems to be excellent blood flow which I think in turn is can allow this to actually heal that is what we want. 02-19-2022 upon evaluation today patient appears to be doing excellent in regard to his wounds. He is going require some sharp debridement but to be honest I think he should really do quite well with this. He did have a lot more bleeding from the second toe after I did clean away some of the necrotic bone last week this week I think it should bleed nearly as much which should help and even more. 02-26-2022 patient appears to be doing well currently in regard to his wounds in fact the second toe looks like it could potentially even be healed although we will get a monitor 1 more week. The great toe is significantly smaller and is healing in an excellent fashion and very pleased in that regard. 10/18; the patient's second toe is healed as far as I can tell. The great toe has 2 open areas both of which look healthy and are measuring smaller. Primary dressing on the left first toe is collagen He has 5 days worth of Levaquin apparently has been on that for 2 months. I cannot see any reason to continue this at this point.He is 4 weeks status post his revascularization Electronic Signature(s) Signed:  03/05/2022 4:28:13 PM By: Baltazar Najjar MD Entered By: Baltazar Najjar on 03/05/2022 09:50:31 Daft, Jshawn ([id number]) [id number] Page 2 of 6 -------------------------------------------------------------------------------- Physical Exam Details Patient Name: Date of Service: [name] LFO RD 03/05/2022 9:15 A M Medical Record Number: [id number] Patient Account Number: [id number] Date of Birth/Sex: Treating RN: [birth year] (81 y.o. M) Primary Care Provider: [name] Other Clinician: Referring Provider: Treating Provider/Extender: Edwyna Ready in Treatment: 9 Constitutional Sitting or standing Blood Pressure is within target range for patient.. Pulse regular and within target range for patient.Marland Kitchen Respirations regular, non-labored and within target range.. Temperature is normal and within the target range for the patient.Marland Kitchen Appears in no distress. Cardiovascular Pedal pulses are palpable. Notes Wound exam; the left second toe tip appears to be healed there is no erythema no tenderness no open wound is visible even under direct illumination The left first toe has 2 open areas which are  remanence of the original wound both of these look healthy certainly no exposed bone here and no evidence of obvious infection Electronic Signature(s) Signed: 03/05/2022 4:28:13 PM By: Baltazar Najjarobson, Starr Engel MD Entered By: Baltazar Najjarobson, Kenderick Kobler on 03/05/2022 09:51:53 -------------------------------------------------------------------------------- Physician Orders Details Patient Name: Date of Service: Violet BaldyWO O D, A LFO RD 03/05/2022 9:15 A M Medical Record Number: [id number] Patient Account Number: [id number] Date of Birth/Sex: Treating RN: Nov 20, 1940 (81 y.o. Lucious GrovesM) Breedlove, [name] Primary Care Provider: Esperanza RichtersSaguier, Edward Other Clinician: Referring Provider: Treating Provider/Extender: Edwyna Readyobson, Daisia Slomski Saguier, Edward Weeks in Treatment: 9 Verbal / Phone Orders:  No Diagnosis Coding Follow-up Appointments ppointment in 1 week. - w/ Allen DerryHoyt Stone and Lauran Rm # 9 Return A Anesthetic (In clinic) Topical Lidocaine 5% applied to wound bed Bathing/ Shower/ Hygiene May shower with protection but do not get wound dressing(s) wet. Edema Control - Lymphedema / SCD / Other Elevate legs to the level of the heart or above for 30 minutes daily and/or when sitting, a frequency of: Avoid standing for long periods of time. Off-Loading Other: - Keep pressure off of Left Foot/toes Wound Treatment Wound #1 - T Great oe Wound Laterality: Left Cleanser: Soap and Water 1 x Per Day/15 Days Discharge Instructions: May shower and wash wound with dial antibacterial soap and water prior to dressing change. Cleanser: Wound Cleanser (Generic) 1 x Per Day/15 Days Discharge Instructions: Cleanse the wound with wound cleanser prior to applying a clean dressing using gauze sponges, not tissue or cotton balls. Prim Dressing: Promogran Prisma Matrix, 4.34 (sq in) (silver collagen) 1 x Per Day/15 Days ary Discharge Instructions: Moisten collagen with saline or hydrogel Prim Dressing: Xeroform Occlusive Gauze Dressing, 4x4 in (Generic) 1 x Per Day/15 Days ary Discharge Instructions: Apply to wound bed as instructed Stokes[place]WOOD, Oneta RackALFORD ([id number]) [id number] Page 3 of 6 Secondary Dressing: Woven Gauze Sponge, Non-Sterile 4x4 in (Generic) 1 x Per Day/15 Days Discharge Instructions: Apply over primary dressing as directed. Secured With: Insurance underwriterConforming Stretch Gauze Bandage, Sterile 2x75 (in/in) (Generic) 1 x Per Day/15 Days Discharge Instructions: Secure with stretch gauze as directed. Secured With: 23M Medipore H Soft Cloth Surgical T ape, 4 x 10 (in/yd) (Generic) 1 x Per Day/15 Days Discharge Instructions: Secure with tape as directed. Secured With: Dole FoodStretch Net Size 2, 10 (yds) (Generic) 1 x Per Day/15 Days Electronic Signature(s) Signed: 03/05/2022 4:28:13 PM  By: Baltazar Najjarobson, Arlita Buffkin MD Signed: 03/06/2022 4:05:37 PM By: Fonnie MuBreedlove, [name] RN Entered By: Fonnie MuBreedlove, [name] on 03/05/2022 09:43:33 -------------------------------------------------------------------------------- Problem List Details Patient Name: Date of Service: Violet BaldyWO O D, A LFO RD 03/05/2022 9:15 A M Medical Record Number: [id number] Patient Account Number: [id number] Date of Birth/Sex: Treating RN: Nov 20, 1940 (81 y.o. M) Primary Care Provider: Esperanza RichtersSaguier, Edward Other Clinician: Referring Provider: Treating Provider/Extender: Edwyna Readyobson, Manual Navarra Saguier, Edward Weeks in Treatment: 9 Active Problems ICD-10 Encounter Code Description Active Date MDM Diagnosis I73.89 Other specified peripheral vascular diseases 01/01/2022 No Yes L97.522 Non-pressure chronic ulcer of other part of left foot with fat layer exposed 01/01/2022 No Yes L97.524 Non-pressure chronic ulcer of other part of left foot with necrosis of bone 02/12/2022 No Yes R73.03 Prediabetes 01/01/2022 No Yes I10 Essential (primary) hypertension 01/01/2022 No Yes Inactive Problems Resolved Problems Electronic Signature(s) Signed: 03/05/2022 4:28:13 PM By: Baltazar Najjarobson, Michaeljohn Biss MD Entered By: Baltazar Najjarobson, Tene Gato on 03/05/2022 09:48:16 [place], [place] ([id number]) [id number] Page 4 of 6 -------------------------------------------------------------------------------- Progress Note Details Patient Name: Date of Service: Richard BucyWO O D, A LFO RD 03/05/2022 9:15 A M Medical Record Number: [id number] Patient Account Number: [id number] Date  of Birth/Sex: Treating RN: Mar 20, 1941 (81 y.o. M) Primary Care Provider: [name] Other Clinician: Referring Provider: Treating Provider/Extender: Edwyna Ready in Treatment: 9 Subjective History of Present Illness (HPI) 01-01-2022 upon evaluation today patient appears to be doing poorly in regard to her wound that is on the left great toe and second toe location.  The toenail of the second toe covers over a portion of the wound which I am concerned may be hiding a piece of exposed bone based on what I see today. The patient does have a history of peripheral vascular disease he is a patient of Dr. Gery Pray who did an abdominal aortogram previous and was unable to intervene with regard to the lower extremity blood flow. The patient had a right ABI which was noncompressible and a left ABI was 0.6 with TBI's of 0.26 unfortunately. The TBI on the right was 0.62 which was not nearly as bad. Nonetheless the unfortunate thing is that with poor blood flow and potential for bone exposure this becomes increasingly complicated if the patient does have osteomyelitis to get this to heal. He does have repeat vascular testing on Monday upcoming. The patient is stated to be "prediabetic" according to notes and the patient. Currently he has been on Keflex. Patient has a history of peripheral vascular disease, prediabetes, hypertension, and unfortunately the current wound which has been present for 4 weeks based on what they tell me today. 01-08-2022 upon evaluation today patient appears to be doing about the same in regard to his wound. Again the x-ray was negative that was performed on 17th everything looks to be okay. With that being said he is continuing to have issues here with poor blood flow and perfusion he did have a repeat arterial study and ABI with TBI TBI could not even be obtained because the signals were so dampened. Previously he had been at 0.2. In regard to the ABI was previously around 0.65 he was now around 0.45. Again this was definitely a decrease on the left side compared to previous the right was stable. He is stated to have critical limb ischemia. 01-15-2022 upon evaluation today patient appears to be doing well currently in regard to his wounds all things considered. Were seeing more growth on the big toe versus the second toe but this is to be expected at  the second toe does have some bone exposed. Fortunately I do not see any evidence of active infection at this time. He does have an appointment scheduled for September 11 which is Monday for an arteriogram and then subsequently the 12th for bypass surgery. Subsequently I think this is can be very good for him as far as trying to get this healed I believe it is good to be the best plan that we have as far as getting good blood flow to allow for healing to continue. Once we get good blood flow I think we can then clean away some of the necrotic bone if necessary to try and allow this to heal more effectively. We also discussed briefly hyperbaric oxygen therapy and the possibility of distal toe amputation if need be but again right now we are still on the plantar stages of even knowing what we can do from a wound care perspective which I think is the first initial goal here. 01-22-2022 upon evaluation today patient appears to be doing better currently in regard to his wounds all things considered. He actually is good to be having his vascular procedures upcoming  next week. Nonetheless I think once we get better blood flow will be able to see where things stand. He is also going to have his MRI on Sunday which I think will also be very beneficial for [place] to know exactly what is going on with the second toe in particular and the way we will get a proceed following. 02-12-2022 upon evaluation today patient appears to be doing well currently in regard to his wounds in fact he is showing signs of significant improvement which is great news. Fortunately I see no evidence of active infection locally or systemically at this time which is great news he has had his bypass on the left lower extremity and this does seem to be doing much better compared to what we were previous. He is in fact about twice a small on the great toe as it was last time I saw him I am very pleased with where things stand with the second toe as  well though there is still bone exposed there seems to be excellent blood flow which I think in turn is can allow this to actually heal that is what we want. 02-19-2022 upon evaluation today patient appears to be doing excellent in regard to his wounds. He is going require some sharp debridement but to be honest I think he should really do quite well with this. He did have a lot more bleeding from the second toe after I did clean away some of the necrotic bone last week this week I think it should bleed nearly as much which should help and even more. 02-26-2022 patient appears to be doing well currently in regard to his wounds in fact the second toe looks like it could potentially even be healed although we will get a monitor 1 more week. The great toe is significantly smaller and is healing in an excellent fashion and very pleased in that regard. 10/18; the patient's second toe is healed as far as I can tell. The great toe has 2 open areas both of which look healthy and are measuring smaller. Primary dressing on the left first toe is collagen He has 5 days worth of Levaquin apparently has been on that for 2 months. I cannot see any reason to continue this at this point.He is 4 weeks status post his revascularization Objective Constitutional Sitting or standing Blood Pressure is within target range for patient.. Pulse regular and within target range for patient.Marland Kitchen Respirations regular, non-labored and within target range.. Temperature is normal and within the target range for the patient.Marland Kitchen Appears in no distress. Vitals Time Taken: 9:10 AM, Temperature: 97.8 F, Pulse: 63 bpm, Respiratory Rate: 18 breaths/min, Blood Pressure: 108/63 mmHg. Cardiovascular [place], [place] ([id number]) [id number] Page 5 of 6 Pedal pulses are palpable. General Notes: Wound exam; the left second toe tip appears to be healed there is no erythema no tenderness no open wound is visible even under  direct illumination The left first toe has 2 open areas which are remanence of the original wound both of these look healthy certainly no exposed bone here and no evidence of obvious infection Integumentary (Hair, Skin) Wound #1 status is Open. Original cause of wound was Trauma. The date acquired was: 12/01/2021. The wound has been in treatment 9 weeks. The wound is located on the Left T Great. The wound measures 1cm length x 0.6cm width x 0.1cm depth; 0.471cm^2 area and 0.047cm^3 volume. There is Fat Layer oe (Subcutaneous Tissue) exposed. There is no tunneling or undermining noted.  There is a medium amount of serosanguineous drainage noted. The wound margin is distinct with the outline attached to the wound base. There is medium (34-66%) red, pink granulation within the wound bed. There is a medium (34-66%) amount of necrotic tissue within the wound bed. The periwound skin appearance had no abnormalities noted for texture. The periwound skin appearance had no abnormalities noted for moisture. The periwound skin appearance had no abnormalities noted for color. Periwound temperature was noted as No Abnormality. Wound #2 status is Healed - Epithelialized. Original cause of wound was Trauma. The date acquired was: 12/01/2021. The wound has been in treatment 9 weeks. The wound is located on the Left T Second. The wound measures 0cm length x 0cm width x 0cm depth; 0cm^2 area and 0cm^3 volume. There is Fat Layer oe (Subcutaneous Tissue) exposed. There is no tunneling or undermining noted. There is a medium amount of serosanguineous drainage noted. The wound margin is distinct with the outline attached to the wound base. There is medium (34-66%) red, pink granulation within the wound bed. There is a medium (34-66%) amount of necrotic tissue within the wound bed. The periwound skin appearance had no abnormalities noted for moisture. The periwound skin appearance had no abnormalities noted for color. The  periwound skin appearance exhibited: Callus. The periwound skin appearance did not exhibit: Crepitus, Excoriation, Induration, Rash, Scarring. Periwound temperature was noted as No Abnormality. 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 - w/ Jeri Cos and Lauran Rm # 9 Anesthetic: (In clinic) Topical Lidocaine 5% applied to wound bed Bathing/ Shower/ Hygiene: May shower with protection but do not get wound dressing(s) wet. Edema Control - Lymphedema / SCD / Other: Elevate legs to the level of the heart or above for 30 minutes daily and/or when sitting, a frequency of: Avoid standing for long periods of time. Off-Loading: Other: - Keep pressure off of Left Foot/toes WOUND #1: - T Great Wound Laterality: Left oe Cleanser: Soap and Water 1 x Per Day/15 Days Discharge Instructions: May shower and wash wound with dial antibacterial soap and water prior to dressing change. Cleanser: Wound Cleanser (Generic) 1 x Per Day/15 Days Discharge Instructions: Cleanse the wound with wound cleanser prior to applying a clean dressing using gauze sponges, not tissue or cotton balls. Prim Dressing: Promogran Prisma Matrix, 4.34 (sq in) (silver collagen) 1 x Per Day/15 Days ary Discharge Instructions: Moisten collagen with saline or hydrogel Prim Dressing: Xeroform Occlusive Gauze Dressing, 4x4 in (Generic) 1 x Per Day/15 Days ary Discharge Instructions: Apply to wound bed as instructed Secondary Dressing: Woven Gauze Sponge, Non-Sterile 4x4 in (Generic) 1 x Per Day/15 Days Discharge Instructions: Apply over primary dressing as directed. Secured With: Child psychotherapist, Sterile 2x75 (in/in)  (Generic) 1 x Per Day/15 Days Discharge Instructions: Secure with stretch gauze as directed. Secured With: 8M Medipore H Soft Cloth Surgical T ape, 4 x 10 (in/yd) (Generic) 1 x Per Day/15 Days Discharge Instructions: Secure with tape as directed. Secured With: Stretch Net Size 2, 10 (yds) (Generic) 1 x Per Day/15 Days 1. No change the primary dressing on the left first toe which is collagen base 2. At this point I think he just needs to protect the tip of his left second toe no specific  dressing is necessary 3. At this point I did not see another good reason to renew the Levaquin. What I am being told is he has been on this for 2 months. He has not had any side effects but at this point I think stopping the Levaquin and continued vigilance for recurrence of infection is in order Electronic Signature(s) Signed: 03/05/2022 4:28:13 PM By: Baltazar Najjar MD Entered By: Baltazar Najjar on 03/05/2022 09:53:00 Siems, Ashtin ([id number]) [id number] Page 6 of 6 -------------------------------------------------------------------------------- SuperBill Details Patient Name: Date of Service: [name] LFO RD 03/05/2022 Medical Record Number: [id number] Patient Account Number: [id number] Date of Birth/Sex: Treating RN: 11-30-1940 (81 y.o. 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 R73.03 Prediabetes I10 Essential (primary) hypertension Facility Procedures : CPT4 Code: [id number] Description: 99214 - WOUND CARE VISIT-LEV 4 EST PT Modifier: Quantity: 1 Physician Procedures : CPT4 Code  Description Modifier [id number] 99214 - WC PHYS LEVEL 4 - EST PT ICD-10 Diagnosis Description L97.522 Non-pressure chronic ulcer of other part of left foot with fat layer exposed I73.89 Other specified peripheral vascular diseases Quantity: 1 Electronic Signature(s) Signed: 03/05/2022 4:28:13 PM By: Baltazar Najjar MD Signed: 03/06/2022 4:05:37 PM By: [name] RN Entered By: [name] on 03/05/2022 10:04:43

## 2022-03-07 NOTE — Progress Notes (Signed)
[place], [place] ([id number]) [id number] Page 1 of 7 Visit Report for 02/26/2022 Arrival Information Details Patient Name: Date of Service: [name] [facility] RD 02/26/2022 11:30 A M Medical Record Number: [id number] Patient Account Number: [id number] Date of Birth/Sex: Treating RN: May 09, 1941 (81 y.o. Cline Cools Primary Care Zymere Patlan: [name] Other Clinician: Referring Taji Sather: Treating Jais Demir/Extender: [name] in Treatment: 8 Visit Information History Since Last Visit Added or deleted any medications: No Patient Arrived: Ambulatory Any new allergies or adverse reactions: No Arrival Time: 12:05 Had a fall or experienced change in No Accompanied By: daughter activities of daily living that may affect Transfer Assistance: None risk of falls: Patient Identification Verified: Yes Signs or symptoms of abuse/neglect since last visito No Secondary Verification Process Completed: Yes Hospitalized since last visit: No Patient Requires Transmission-Based Precautions: No Implantable device outside of the clinic excluding No Patient Has Alerts: Yes cellular tissue based products placed in the center Patient Alerts: ABI's:02/23 R: N/C L:0.6 since last visit: TBI's:02/23 R:0.62L:0.26 Pain Present Now: No Electronic Signature(s) Signed: 03/03/2022 1:44:39 PM By: Redmond Pulling RN, BSN Entered By: Redmond Pulling on 02/26/2022 12:06:57 -------------------------------------------------------------------------------- Encounter Discharge Information Details Patient Name: Date of Service: [name], A LFO RD 02/26/2022 11:30 A M Medical Record Number: [id number] Patient Account Number: [id number] Date of Birth/Sex: Treating RN: 01/12/1941 (81 y.o. Cline Cools Primary Care Kristeen Lantz: [name] Other Clinician: Referring Smera Guyette: Treating Teng Decou/Extender: [name] in Treatment:  8 Encounter Discharge Information Items Post Procedure Vitals Discharge Condition: Stable Temperature (F): 97.8 Ambulatory Status: Ambulatory Pulse (bpm): 69 Discharge Destination: Home Respiratory Rate (breaths/min): 18 Transportation: Private Auto Blood Pressure (mmHg): 128/68 Accompanied By: daughter Schedule Follow-up Appointment: Yes Clinical Summary of Care: Patient Declined Electronic Signature(s) Signed: 03/03/2022 1:44:39 PM By: Redmond Pulling RN, BSN Entered By: Redmond Pulling on 02/26/2022 14:12:49 [place], Autrey ([id number]) [id number] Page 2 of 7 -------------------------------------------------------------------------------- Lower Extremity Assessment Details Patient Name: Date of Service: [name] LFO RD 02/26/2022 11:30 A M Medical Record Number: [id number] Patient Account Number: [id number] Date of Birth/Sex: Treating RN: 1941/01/15 (81 y.o. Cline Cools Primary Care Leylanie Woodmansee: [name] Other Clinician: Referring Mardie Kellen: Treating Walter Min/Extender: [name] in Treatment: 8 Edema Assessment Assessed: Kyra Searles: No] [Right: No] Edema: [Left: Ye] [Right: s] Calf Left: Right: Point of Measurement: 37 cm From Medial Instep 42.5 cm Ankle Left: Right: Point of Measurement: 10 cm From Medial Instep 26.3 cm Vascular Assessment Pulses: Dorsalis Pedis Palpable: [Left:Yes] Electronic Signature(s) Signed: 03/03/2022 1:44:39 PM By: Redmond Pulling RN, BSN Entered By: Redmond Pulling on 02/26/2022 12:08:30 -------------------------------------------------------------------------------- Multi-Disciplinary Care Plan Details Patient Name: Date of Service: [name], A LFO RD 02/26/2022 11:30 A M Medical Record Number: [id number] Patient Account Number: [id number] Date of Birth/Sex: Treating RN: May 15, 1941 (81 y.o. Cline Cools Primary Care Fusae Florio: [name] Other Clinician: Referring  Zuley Lutter: Treating Ural Acree/Extender: [name] in Treatment: 8 Active Inactive Tissue Oxygenation Nursing Diagnoses: Actual ineffective tissue perfusion; peripheral (select once diagnosis is confirmed) Knowledge deficit related to disease process and management Potential alteration in peripheral tissue perfusion (select prior to confirmation of diagnosis) Goals: Invasive arterial studies completed as ordered Date Initiated: 01/01/2022 Target Resolution Date: 03/01/2022 Goal Status: Active Non-invasive arterial studies are completed as ordered Date Initiated: 01/01/2022 Target Resolution Date: 02/22/2022 Goal Status: Active [place], Demontay ([id number]) [id number] Page 3 of 7 Patient/caregiver will verbalize understanding of disease process and disease  management Date Initiated: 01/01/2022 Target Resolution Date: 02/22/2022 Goal Status: Active Interventions: Assess patient understanding of disease process and management upon diagnosis and as needed Assess peripheral arterial status upon admission and as needed Provide education on tissue oxygenation and ischemia Notes: Wound/Skin Impairment Nursing Diagnoses: Impaired tissue integrity Knowledge deficit related to ulceration/compromised skin integrity Goals: Patient will have a decrease in wound volume by X% from date: (specify in notes) Date Initiated: 01/01/2022 Target Resolution Date: 02/22/2022 Goal Status: Active Patient/caregiver will verbalize understanding of skin care regimen Date Initiated: 01/01/2022 Target Resolution Date: 02/22/2022 Goal Status: Active Ulcer/skin breakdown will have a volume reduction of 30% by week 4 Date Initiated: 01/01/2022 Target Resolution Date: 02/22/2022 Goal Status: Active Interventions: Assess patient/caregiver ability to obtain necessary supplies Assess patient/caregiver ability to perform ulcer/skin care regimen upon admission and as  needed Assess ulceration(s) every visit Notes: Electronic Signature(s) Signed: 03/03/2022 1:44:39 PM By: Sharyn Creamer RN, BSN Entered By: Sharyn Creamer on 02/26/2022 12:14:26 -------------------------------------------------------------------------------- Pain Assessment Details Patient Name: Date of Service: [name], A LFO RD 02/26/2022 11:30 A M Medical Record Number: [id number] Patient Account Number: [id number] Date of Birth/Sex: Treating RN: 24-Apr-1941 (81 y.o. Mare Ferrari Primary Care Linford Quintela: Mackie Pai Other Clinician: Referring Honestie Kulik: Treating Verlisa Vara/Extender: Sheran Lawless in Treatment: 8 Active Problems Location of Pain Severity and Description of Pain Patient Has Paino No Site Locations [place], [place] ([id number]) [phone].pdf Page 4 of 7 Pain Management and Medication Current Pain Management: Electronic Signature(s) Signed: 03/03/2022 1:44:39 PM By: Sharyn Creamer RN, BSN Entered By: Sharyn Creamer on 02/26/2022 12:07:48 -------------------------------------------------------------------------------- Patient/Caregiver Education Details Patient Name: Date of Service: [name] LFO RD 10/11/2023andnbsp11:30 A M Medical Record Number: [id number] Patient Account Number: [id number] Date of Birth/Gender: Treating RN: 1941-01-05 (81 y.o. Mare Ferrari Primary Care Physician: Mackie Pai Other Clinician: Referring Physician: Treating Physician/Extender: Neita Garnet, Mathis Fare in Treatment: 8 Education Assessment Education Provided To: Patient Education Topics Provided Wound/Skin Impairment: Methods: Explain/Verbal Responses: State content correctly [employer]) Signed: 03/03/2022 1:44:39 PM By: Sharyn Creamer RN, BSN Entered By: Sharyn Creamer on 02/26/2022 12:15:15 -------------------------------------------------------------------------------- Wound Assessment  Details Patient Name: Date of Service: [name], A LFO RD 02/26/2022 11:30 A M Medical Record Number: [id number] Patient Account Number: [id number] Date of Birth/Sex: Treating RN: 1941-02-12 (81 y.o. Mare Ferrari Primary Care Katee Wentland: Mackie Pai Other Clinician: Referring Claudy Abdallah: Treating Aalayah Riles/Extender: Waldemar Dickens [place], [place] ([id number]) [phone].pdf Page 5 of 7 Weeks in Treatment: 8 Wound Status Wound Number: 1 Primary Arterial Insufficiency Ulcer Etiology: Wound Location: Left T Great oe Wound Open Wounding Event: Trauma Status: Date Acquired: 12/01/2021 Comorbid Coronary Artery Disease, Hypotension, Peripheral Arterial Weeks Of Treatment: 8 History: Disease, Peripheral Venous Disease, Type II Diabetes, Clustered Wound: No Osteoarthritis Photos Wound Measurements Length: (cm) 4.2 Width: (cm) 0.9 Depth: (cm) 0.1 Area: (cm) 2.969 Volume: (cm) 0.297 % Reduction in Area: 69.8% % Reduction in Volume: 84.9% Epithelialization: Medium (34-66%) Tunneling: No Undermining: No Wound Description Classification: Full Thickness With Exposed Suppo Wound Margin: Distinct, outline attached Exudate Amount: Medium Exudate Type: Serosanguineous Exudate Color: red, brown rt Structures Foul Odor After Cleansing: No Slough/Fibrino Yes Wound Bed Granulation Amount: Medium (34-66%) Exposed Structure Granulation Quality: Red, Pink Fascia Exposed: No Necrotic Amount: Medium (34-66%) Fat Layer (Subcutaneous Tissue) Exposed: Yes Tendon Exposed: No Muscle Exposed: No Joint Exposed: No Bone Exposed: No Periwound Skin Texture Texture Color No Abnormalities Noted: Yes No Abnormalities Noted: Yes Moisture Temperature /  Pain No Abnormalities Noted: Yes Temperature: No Abnormality Electronic Signature(s) Signed: 03/03/2022 1:44:39 PM By: Redmond Pulling RN, BSN Signed: 03/07/2022 11:35:40 AM By: Dayton Scrape Entered By: Dayton Scrape on 02/26/2022 12:15:53 -------------------------------------------------------------------------------- Wound Assessment Details Patient Name: Date of Service: [name], A LFO RD 02/26/2022 11:30 A M Medical Record Number: [id number] Patient Account Number: [id number] Date of Birth/Sex: Treating RN: [birth year] (81 y.o. Cline Cools Primary Care Nadra Hritz: Hilda Blades Clinician: [place], Oneta Rack ([id number]) [id number] Page 6 of 7 Referring Ambrea Hegler: Treating Asmi Fugere/Extender: [name] in Treatment: 8 Wound Status Wound Number: 2 Primary Arterial Insufficiency Ulcer Etiology: Wound Location: Left T Second oe Wound Open Wounding Event: Trauma Status: Date Acquired: 12/01/2021 Comorbid Coronary Artery Disease, Hypotension, Peripheral Arterial Weeks Of Treatment: 8 History: Disease, Peripheral Venous Disease, Type II Diabetes, Clustered Wound: No Osteoarthritis Photos Wound Measurements Length: (cm) 0.2 Width: (cm) 0.2 Depth: (cm) 0.2 Area: (cm) 0.031 Volume: (cm) 0.006 % Reduction in Area: 92.1% % Reduction in Volume: 84.6% Epithelialization: None Tunneling: No Undermining: Yes Starting Position (o'clock): 12 Ending Position (o'clock): 12 Maximum Distance: (cm) 0.3 Wound Description Classification: Full Thickness With Exposed Suppo Wound Margin: Distinct, outline attached Exudate Amount: Medium Exudate Type: Serosanguineous Exudate Color: red, brown rt Structures Foul Odor After Cleansing: No Slough/Fibrino Yes Wound Bed Granulation Amount: Medium (34-66%) Exposed Structure Granulation Quality: Red, Pink Fascia Exposed: No Necrotic Amount: Medium (34-66%) Fat Layer (Subcutaneous Tissue) Exposed: Yes Tendon Exposed: No Muscle Exposed: No Joint Exposed: No Bone Exposed: No Periwound Skin Texture Texture Color No Abnormalities Noted: No No Abnormalities Noted: Yes Callus: Yes Temperature /  Pain Crepitus: No Temperature: No Abnormality Excoriation: No Induration: No Rash: No Scarring: No Moisture No Abnormalities Noted: Yes Electronic Signature(s) Signed: 03/03/2022 1:44:39 PM By: Redmond Pulling RN, BSN Signed: 03/07/2022 11:35:40 AM By: Dayton Scrape Entered By: Dayton Scrape on 02/26/2022 12:15:09 Rands, Travarus ([id number]) [id number] Page 7 of 7 -------------------------------------------------------------------------------- Vitals Details Patient Name: Date of Service: [name] LFO RD 02/26/2022 11:30 A M Medical Record Number: [id number] Patient Account Number: [id number] Date of Birth/Sex: Treating RN: April 14, 1941 (81 y.o. Cline Cools Primary Care Taylen Osorto: [name] Other Clinician: Referring Gaylyn Berish: Treating Duff Pozzi/Extender: [name] in Treatment: 8 Vital Signs Time Taken: 12:07 Temperature (F): 97.8 Pulse (bpm): 69 Respiratory Rate (breaths/min): 18 Blood Pressure (mmHg): 128/68 Reference Range: 80 - 120 mg / dl Electronic Signature(s) Signed: 03/03/2022 1:44:39 PM By: Redmond Pulling RN, BSN Entered By: Redmond Pulling on 02/26/2022 12:07:39

## 2022-03-11 ENCOUNTER — Ambulatory Visit (INDEPENDENT_AMBULATORY_CARE_PROVIDER_SITE_OTHER): Payer: Medicare HMO | Admitting: Medical

## 2022-03-11 VITALS — BP 120/72 | HR 68 | Temp 98.2°F | Resp 18 | Ht 73.0 in | Wt 220.6 lb

## 2022-03-11 DIAGNOSIS — E119 Type 2 diabetes mellitus without complications: Secondary | ICD-10-CM | POA: Diagnosis not present

## 2022-03-11 DIAGNOSIS — E785 Hyperlipidemia, unspecified: Secondary | ICD-10-CM | POA: Diagnosis not present

## 2022-03-11 DIAGNOSIS — I10 Essential (primary) hypertension: Secondary | ICD-10-CM

## 2022-03-11 DIAGNOSIS — I739 Peripheral vascular disease, unspecified: Secondary | ICD-10-CM

## 2022-03-11 NOTE — Patient Instructions (Addendum)
Htn- bp well controlled. Continue toprol.  CAD- continue imdur daily and nitrostat as needed. Follow up with cardiologist and regularly sheduled.  Jerrye Bushy- continue famotadine.  Diabetes- continue metformin low sugar diet and get a1c week of Apr 07, 2022. Lipid panel and cmp to be done as well.  For PAD history and toe infection follow up with wound care tomorrow. Continue continue asa/plavix/Praluent"  Follow up date to be determined after lab review Get scheduled for lab appointment at check out.

## 2022-03-11 NOTE — Progress Notes (Signed)
Subjective:    Patient ID: [name], male    DOB: 06-Sep-1940, 81 y.o.   MRN: [id number]  HPI  Pt in reporting he feels well today.  Pt states his foot feels good and wound is healing well. He has appointment tomorrow with wound care tomorrow.  Pt states he has one more tablet of antibiotic left. He had foot infection with delayed healing due to blood flow.   Pt saw vascular surgeon day after I last saw him.  "Assessment/Plan:  This is a 81 y.o. male who is s/p: :Left common femoral to below-knee popliteal artery bypass with 6 mm ringed PTFE on 01/28/22 by Dr. 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    -pt with brisk doppler flow left foot -per pt, toe wounds are healing.  Dressing not removed as they were just placed at the wound center.  -left groin drainage appears to be resolved.  Discussed with pt to continue to wash with soap and water and then keep dry otherwise.  He will f/u in 2 months with ABI and LLE arterial duplex.  They know to call sooner if he develops any issues before then.   -continue asa/plavix/Praluent"    No reported current  cardiac or neurologic signs/symptoms.   Htn- bp well controlled. Continue toprol.  Pt has CAD- his cardiologist has him on indur 60 mg daily. On nitrostat as needed. Last brief chest pain event resolved one week ago with just rest. He never took nitrostat.    Genella Rife- in past had rx'd famotadine. He reports no heart burn symptoms.   Diabetes- on metformin twice a day. No side effects. A1c 01-02-22 was 7.3.    Review of Systems  Constitutional:  Negative for chills, fatigue and fever.  HENT:  Negative for congestion, drooling, ear discharge and ear pain.   Respiratory:  Negative for cough, chest tightness, shortness of breath and wheezing.   Cardiovascular:  Negative for chest pain and palpitations.  Gastrointestinal:  Negative for abdominal pain, constipation and nausea.   Genitourinary:  Negative for dysuria and frequency.  Musculoskeletal:  Negative for back pain and myalgias.  Skin:  Negative for rash.  Neurological:  Negative for dizziness, seizures, syncope, weakness and headaches.  Hematological:  Negative for adenopathy. Does not bruise/bleed easily.  Psychiatric/Behavioral:  Negative for behavioral problems and confusion. The patient is not nervous/anxious and is not hyperactive.     Past Medical History:  Diagnosis Date   Claudication (HCC) 06/27/2011   Left ABIs abnormal values with mild-moderate arterial insufficiency, right SFA moderate amount of mixed density plaque elevating velocities suggestive 50-69% diameter reduction   Coronary artery disease 06/27/2011   Normal Lexiscan, EF 64, post-stress EF 64, no EKG changes   Diabetes mellitus without complication (HCC)    prediabetic   Hyperlipidemia    Hypertension    Impotence of organic origin    Osteoarthritis    Other testicular hypofunction    PVD (peripheral vascular disease) (HCC)      Social History   Socioeconomic History   Marital status: Married    Spouse name: cathy   Number of children: 1   Years of education: 12   Highest education level: Not on file  Occupational History   Occupation: retired  Tobacco Use   Smoking status: Former    Packs/day: 1.00    Years: 50.00    Total pack years: 50.00    Types: Cigarettes    Quit  date: 12/17/2004    Years since quitting: 17.2   Smokeless tobacco: Never  Vaping Use   Vaping Use: Never used  Substance and Sexual Activity   Alcohol use: Yes    Comment: occasionally   Drug use: No   Sexual activity: Not on file  Other Topics Concern   Not on file  Social History Narrative   Lives with wife   Caffeine - coffee, 1 cup daily   Social Determinants of Health   Financial Resource Strain: Not on file  Food Insecurity: No Food Insecurity (02/04/2022)   Hunger Vital Sign    Worried About Running Out of Food in the Last Year:  Never true    Ran Out of Food in the Last Year: Never true  Transportation Needs: No Transportation Needs (02/04/2022)   PRAPARE - Administrator, Civil Service (Medical): No    Lack of Transportation (Non-Medical): No  Physical Activity: Not on file  Stress: Not on file  Social Connections: Not on file  Intimate Partner Violence: Not At Risk (02/05/2022)   Humiliation, Afraid, Rape, and Kick questionnaire    Fear of Current or Ex-Partner: No    Emotionally Abused: No    Physically Abused: No    Sexually Abused: No    Past Surgical History:  Procedure Laterality Date   ABDOMINAL AORTOGRAM N/A 06/28/2020   Procedure: ABDOMINAL AORTOGRAM;  Surgeon: [name], MD;  Location: [facility] INVASIVE CV LAB;  Service: Cardiovascular;  Laterality: N/A;   ABDOMINAL AORTOGRAM W/LOWER EXTREMITY Left 01/27/2022   Procedure: ABDOMINAL AORTOGRAM W/LOWER EXTREMITY;  Surgeon: Maeola Harman, MD;  Location: [facility] INVASIVE CV LAB;  Service: Cardiovascular;  Laterality: Left;   CARDIAC CATHETERIZATION  12/26/2004   Left main-90% distal tapered stenosis, left circumflex-95% ostial stenosis, first marginal branch 99% ostial branch, posterior descending artery 99% ostial stenosis, 60-70% segmental mid stenosis, needed CABG   CORONARY ARTERY BYPASS GRAFT  12/27/2004   Left main 3-vessel disease, LIMA to LAD, saphenous vein to circumflexmarginal branch and to the PDA   ENDARTERECTOMY FEMORAL Left 01/28/2022   Procedure: LEFT ENDARTERECTOMYCOMMON  [facility] BELOW KNEE POPLITEAL;  Surgeon: Maeola Harman, MD;  Location: [facility] OR;  Service: Vascular;  Laterality: Left;   FEMORAL-POPLITEAL BYPASS GRAFT Left 01/28/2022   Procedure: LEFT FEMORAL-POPLITEAL ARTERY BYPASS;  Surgeon: Maeola Harman, MD;  Location: [facility] OR;  Service: Vascular;  Laterality: Left;   INGUINAL HERNIA REPAIR Left 2005   LOWER EXTREMITY ANGIOGRAPHY N/A 04/13/2017   Procedure: LOWER EXTREMITY ANGIOGRAPHY;  Surgeon:  [name], MD;  Location: [facility]VAS[facility] CV LAB;  Service: Cardiovascular;  Laterality: N/A;   LOWER EXTREMITY ANGIOGRAPHY Bilateral 12/06/2020   Procedure: Lower Extremity Angiography;  Surgeon: [name], MD;  Location: [facility] INVASIVE CV LAB;  Service: Cardiovascular;  Laterality: Bilateral;  Limited Study   LOWER EXTREMITY INTERVENTION N/A 08/04/2016   Procedure: Lower Extremity Intervention;  Surgeon: [name], MD;  Location: [facility] INVASIVE CV LAB;  Service: Cardiovascular;  Laterality: N/A;   PERIPHERAL VASCULAR ATHERECTOMY Left 04/13/2017   Procedure: PERIPHERAL VASCULAR ATHERECTOMY;  Surgeon: [name], MD;  Location: [facility] INVASIVE CV LAB;  Service: Cardiovascular;  Laterality: Left;  SFA   PERIPHERAL VASCULAR ATHERECTOMY  12/06/2020   Procedure: PERIPHERAL VASCULAR ATHERECTOMY;  Surgeon: [name], MD;  Location: [facility] INVASIVE CV LAB;  Service: Cardiovascular;;  Lt Iliac   PERIPHERAL VASCULAR BALLOON ANGIOPLASTY Left 04/13/2017   Procedure: PERIPHERAL VASCULAR BALLOON ANGIOPLASTY;  Surgeon: Allyson Sabal,  Pearletha Forge, MD;  Location: [place] CV LAB;  Service: Cardiovascular;  Laterality: Left;  SFA   PERIPHERAL VASCULAR INTERVENTION  12/06/2020   Procedure: PERIPHERAL VASCULAR INTERVENTION;  Surgeon: Lorretta Harp, MD;  Location: [place] CV LAB;  Service: Cardiovascular;;  Lt Iliac   RIGHT/LEFT HEART CATH AND CORONARY/GRAFT ANGIOGRAPHY N/A 06/28/2020   Procedure: RIGHT/LEFT HEART CATH AND CORONARY/GRAFT ANGIOGRAPHY;  Surgeon: Lorretta Harp, MD;  Location: [place] CV LAB;  Service: Cardiovascular;  Laterality: N/A;    Family History  Problem Relation Age of Onset   Hypertension Mother        65   Clotting disorder Father        28   Diabetes Brother        heart surgery    Allergies  Allergen Reactions   Crestor [Rosuvastatin] Other (See Comments)    Myalgias   Lipitor [Atorvastatin] Other (See Comments)    Myalgias    Zestril [Lisinopril]  Cough    Current Outpatient Medications on File Prior to Visit  Medication Sig Dispense Refill   amLODipine (NORVASC) 10 MG tablet TAKE 1 TABLET BY MOUTH EVERY DAY (Patient taking differently: Take 10 mg by mouth daily.) 90 tablet 3   aspirin 81 MG tablet Take 81 mg by mouth daily.     Cholecalciferol (VITAMIN D) 50 MCG (2000 UT) tablet Take 2,000 Units by mouth daily.     clopidogrel (PLAVIX) 75 MG tablet TAKE 1 TABLET BY MOUTH EVERY DAY (Patient taking differently: Take 75 mg by mouth daily.) 90 tablet 3   Coenzyme Q10 (COQ10) 200 MG CAPS Take 200 mg by mouth daily.     famotidine (PEPCID) 20 MG tablet Take 1 tablet (20 mg total) by mouth daily. 90 tablet 1   fluticasone (FLONASE) 50 MCG/ACT nasal spray Place 2 sprays into both nostrils daily. 16 g 1   isosorbide mononitrate (IMDUR) 60 MG 24 hr tablet Take 1 tablet (60 mg total) by mouth daily. 90 tablet 3   levocetirizine (XYZAL) 5 MG tablet Take 1 tablet (5 mg total) by mouth every evening. 90 tablet 1   levofloxacin (LEVAQUIN) 750 MG tablet Take 750 mg by mouth daily. 30 day course.     meclizine (ANTIVERT) 12.5 MG tablet Take 1 tablet (12.5 mg total) by mouth 3 (three) times daily as needed for dizziness. 8 tablet 0   metFORMIN (GLUCOPHAGE) 500 MG tablet Take 1 tablet (500 mg total) by mouth 2 (two) times daily with a meal. 180 tablet 3   metoprolol succinate (TOPROL-XL) 50 MG 24 hr tablet Take 1 tablet (50 mg total) by mouth 2 (two) times daily. Take with or immediately following a meal. 30 tablet 6   nitroGLYCERIN (NITROSTAT) 0.4 MG SL tablet Place 1 tablet (0.4 mg total) under the tongue every 5 (five) minutes as needed for chest pain. 25 tablet 3   Omega-3 Fatty Acids (FISH OIL) 1200 MG CAPS Take 2 capsules (2,400 mg total) by mouth every morning.     PRALUENT 150 MG/ML SOAJ INJECT 150 MG INTO THE SKIN EVERY 14 (FOURTEEN) DAYS. 2 mL 11   No current facility-administered medications on file prior to visit.    BP 120/72   Pulse 68    Temp 98.2 F (36.8 C)   Resp 18   Ht 6\' 1"  (1.854 m)   Wt 220 lb 9.6 oz (100.1 kg)   SpO2 97%   BMI 29.10 kg/m  Objective:   Physical Exam   General- No acute distress. Pleasant patient. Neck- Full range of motion, no jvd Lungs- Clear, even and unlabored. Heart- regular rate and rhythm. Neurologic- CNII- XII grossly intact.  Left foot in post op shoe- did not examen since seeing wound care tomorrow.      Assessment & Plan:   Patient Instructions  Htn- bp well controlled. Continue toprol.  CAD- continue imdur daily and nitrostat as needed. Follow up with cardiologist and regularly sheduled.  Genella Rife- continue famotadine.  Diabetes- continue metformin low sugar diet and get a1c week of nov 20, 20223. Lipid panel and cmp to be done as well.  For PAD history and toe infection follow up with wound care tomorrow. Continue continue asa/plavix/Praluent"  Follow up date to be determined after lab review Get scheduled for lab appointment at check out.     [name], PA-C

## 2022-03-12 ENCOUNTER — Encounter (HOSPITAL_BASED_OUTPATIENT_CLINIC_OR_DEPARTMENT_OTHER): Payer: Medicare HMO | Admitting: Physician Assistant

## 2022-03-12 DIAGNOSIS — E11621 Type 2 diabetes mellitus with foot ulcer: Secondary | ICD-10-CM | POA: Diagnosis not present

## 2022-03-12 NOTE — Progress Notes (Signed)
[place], [place] ([id number]) [id number] Page 1 of 8 Visit Report for 03/12/2022 Chief Complaint Document Details Patient Name: Date of Service: [name] [facility] RD 03/12/2022 9:15 A M Medical Record Number: [id number] Patient Account Number: [id number] Date of Birth/Sex: Treating RN: February 02, 1941 (81 y.o. Erie Noe Primary Care Provider: Mackie Pai Other Clinician: Referring Provider: Treating Provider/Extender: Sheran Lawless in Treatment: 10 Information Obtained from: Patient Chief Complaint Left 1st and 2nd toe ulcers Electronic Signature(s) Signed: 03/12/2022 9:15:05 AM By: Worthy Keeler PA-C Entered By: Worthy Keeler on 03/12/2022 09:15:05 -------------------------------------------------------------------------------- Debridement Details Patient Name: Date of Service: [name] LFO RD 03/12/2022 9:15 A M Medical Record Number: [id number] Patient Account Number: [id number] Date of Birth/Sex: Treating RN: June 16, 1940 (81 y.o. [name] Primary Care Provider: Mackie Pai Other Clinician: Referring Provider: Treating Provider/Extender: Sheran Lawless in Treatment: 10 Debridement Performed for Assessment: Wound #1 Left T Great oe Performed By: Physician Worthy Keeler, PA Debridement Type: Debridement Severity of Tissue Pre Debridement: Fat layer exposed Level of Consciousness (Pre-procedure): Awake and Alert Pre-procedure Verification/Time Out Yes - 09:28 Taken: Start Time: 09:29 Pain Control: Lidocaine 5% topical ointment T Area Debrided (L x W): otal 1 (cm) x 1 (cm) = 1 (cm) Tissue and other material debrided: Viable, Non-Viable, Slough, Subcutaneous, Skin: Dermis , Skin: Epidermis, Slough Level: Skin/Subcutaneous Tissue Debridement Description: Excisional Instrument: Curette Bleeding: Minimum Hemostasis Achieved: Pressure End Time: 09:35 Procedural Pain: 0 Post  Procedural Pain: 0 Response to Treatment: Procedure was tolerated well Level of Consciousness (Post- Awake and Alert procedure): Post Debridement Measurements of Total Wound Length: (cm) 0.3 Width: (cm) 0.6 Depth: (cm) 0.1 Volume: (cm) 0.014 Character of Wound/Ulcer Post Debridement: Improved Severity of Tissue Post Debridement: Fat layer exposed Bovey, Bobbie ([id number] Page 2 of 8 Post Procedure Diagnosis Same as Pre-procedure Electronic Signature(s) Signed: 03/12/2022 6:42:42 PM By: Worthy Keeler PA-C Signed: 03/12/2022 6:57:47 PM By: Deon Pilling RN, BSN Entered By: Deon Pilling on 03/12/2022 09:35:16 -------------------------------------------------------------------------------- HPI Details Patient Name: Date of Service: [name], A LFO RD 03/12/2022 9:15 A M Medical Record Number: [id number] Patient Account Number: [id number] Date of Birth/Sex: Treating RN: 26-Mar-1941 (81 y.o. Erie Noe Primary Care Provider: Mackie Pai Other Clinician: Referring Provider: Treating Provider/Extender: Sheran Lawless in Treatment: 10 History of Present Illness HPI Description: 01-01-2022 upon evaluation today patient appears to be doing poorly in regard to her wound that is on the left great toe and second toe location. The toenail of the second toe covers over a portion of the wound which I am concerned may be hiding a piece of exposed bone based on what I see today. The patient does have a history of peripheral vascular disease he is a patient of Dr. Alvester Chou who did an abdominal aortogram previous and was unable to intervene with regard to the lower extremity blood flow. The patient had a right ABI which was noncompressible and a left ABI was 0.6 with TBI's of 0.26 unfortunately. The TBI on the right was 0.62 which was not nearly as bad. Nonetheless the unfortunate thing is that with poor blood flow and potential  for bone exposure this becomes increasingly complicated if the patient does have osteomyelitis to get this to heal. He does have repeat vascular testing on Monday upcoming. The patient is stated to be "prediabetic" according to notes and the patient. Currently he has been on Keflex. Patient  has a history of peripheral vascular disease, prediabetes, hypertension, and unfortunately the current wound which has been present for 4 weeks based on what they tell me today. 01-08-2022 upon evaluation today patient appears to be doing about the same in regard to his wound. Again the x-ray was negative that was performed on 17th everything looks to be okay. With that being said he is continuing to have issues here with poor blood flow and perfusion he did have a repeat arterial study and ABI with TBI TBI could not even be obtained because the signals were so dampened. Previously he had been at 0.2. In regard to the ABI was previously around 0.65 he was now around 0.45. Again this was definitely a decrease on the left side compared to previous the right was stable. He is stated to have critical limb ischemia. 01-15-2022 upon evaluation today patient appears to be doing well currently in regard to his wounds all things considered. Were seeing more growth on the big toe versus the second toe but this is to be expected at the second toe does have some bone exposed. Fortunately I do not see any evidence of active infection at this time. He does have an appointment scheduled for September 11 which is Monday for an arteriogram and then subsequently the 12th for bypass surgery. Subsequently I think this is can be very good for him as far as trying to get this healed I believe it is good to be the best plan that we have as far as getting good blood flow to allow for healing to continue. Once we get good blood flow I think we can then clean away some of the necrotic bone if necessary to try and allow this to heal more  effectively. We also discussed briefly hyperbaric oxygen therapy and the possibility of distal toe amputation if need be but again right now we are still on the plantar stages of even knowing what we can do from a wound care perspective which I think is the first initial goal here. 01-22-2022 upon evaluation today patient appears to be doing better currently in regard to his wounds all things considered. He actually is good to be having his vascular procedures upcoming next week. Nonetheless I think once we get better blood flow will be able to see where things stand. He is also going to have his MRI on Sunday which I think will also be very beneficial for [place] to know exactly what is going on with the second toe in particular and the way we will get a proceed following. 02-12-2022 upon evaluation today patient appears to be doing well currently in regard to his wounds in fact he is showing signs of significant improvement which is great news. Fortunately I see no evidence of active infection locally or systemically at this time which is great news he has had his bypass on the left lower extremity and this does seem to be doing much better compared to what we were previous. He is in fact about twice a small on the great toe as it was last time I saw him I am very pleased with where things stand with the second toe as well though there is still bone exposed there seems to be excellent blood flow which I think in turn is can allow this to actually heal that is what we want. 02-19-2022 upon evaluation today patient appears to be doing excellent in regard to his wounds. He is going require some sharp debridement  but to be honest I think he should really do quite well with this. He did have a lot more bleeding from the second toe after I did clean away some of the necrotic bone last week this week I think it should bleed nearly as much which should help and even more. 02-26-2022 patient appears to be doing well  currently in regard to his wounds in fact the second toe looks like it could potentially even be healed although we will get a monitor 1 more week. The great toe is significantly smaller and is healing in an excellent fashion and very pleased in that regard. 10/18; the patient's second toe is healed as far as I can tell. The great toe has 2 open areas both of which look healthy and are measuring smaller. Primary dressing on the left first toe is collagen He has 5 days worth of Levaquin apparently has been on that for 2 months. I cannot see any reason to continue this at this point.He is 4 weeks status post his revascularization 03-12-2022 upon evaluation today patient appears to be doing excellent in regard to his wound in fact this is significantly smaller compared to previous. I am extremely pleased and I do believe that we are on the right track here. I am going to go ahead and perform some debridement today just to clearway some of the necrotic debris on the surface of the wound. He is done with the Levaquin. [place], [place] ([id number]) [id number] Page 3 of 8 Electronic Signature(s) Signed: 03/12/2022 10:34:54 AM By: Worthy Keeler PA-C Entered By: Worthy Keeler on 03/12/2022 10:34:53 -------------------------------------------------------------------------------- Physical Exam Details Patient Name: Date of Service: [name] LFO RD 03/12/2022 9:15 A M Medical Record Number: [id number] Patient Account Number: [id number] Date of Birth/Sex: Treating RN: 1941/03/30 (81 y.o. Erie Noe Primary Care Provider: Mackie Pai Other Clinician: Referring Provider: Treating Provider/Extender: Sheran Lawless in Treatment: 37 Constitutional Well-nourished and well-hydrated in no acute distress. Respiratory normal breathing without difficulty. Psychiatric this patient is able to make decisions and demonstrates good insight into  disease process. Alert and Oriented x 3. pleasant and cooperative. Notes Upon inspection patient's wound bed showed signs of excellent granulation and epithelization at this point. Fortunately I do not see any evidence of infection which is great news and overall I am extremely pleased with where things stand today. Electronic Signature(s) Signed: 03/12/2022 10:35:12 AM By: Worthy Keeler PA-C Entered By: Worthy Keeler on 03/12/2022 10:35:12 -------------------------------------------------------------------------------- Physician Orders Details Patient Name: Date of Service: [name], A LFO RD 03/12/2022 9:15 A M Medical Record Number: [id number] Patient Account Number: [id number] Date of Birth/Sex: Treating RN: 10/03/1940 (81 y.o. 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 Margarita Grizzle, PA Wednesday Return A Anesthetic (In clinic) Topical Lidocaine 5% applied to wound bed [address] [place], [place] ([id number]) [phone].pdf Page 4 of 8 May shower with protection but do not get wound dressing(s) wet. Edema Control - Lymphedema / SCD / Other Elevate legs to the level of the heart  or above for 30 minutes daily and/or when sitting, a frequency of: - 3-4 times a day throughout the day. Avoid standing for long periods of time. Moisturize legs daily. Off-Loading Open toe surgical shoe to: - left foot use while walking and  standing. Other: - Keep pressure off of Left Foot/toes Wound Treatment Wound #1 - T Great oe Wound Laterality: Left Cleanser: Soap and Water 1 x Per Day/15 Days Discharge Instructions: May shower and wash wound with dial antibacterial soap and water prior to dressing change. Cleanser: Wound Cleanser (Generic) 1 x Per Day/15 Days Discharge Instructions: Cleanse the wound with wound cleanser prior to applying a clean dressing using gauze sponges, not tissue or cotton balls. Prim Dressing: Promogran Prisma Matrix, 4.34 (sq in) (silver collagen) 1 x Per Day/15 Days ary Discharge Instructions: Moisten collagen with saline or hydrogel Prim Dressing: Xeroform Occlusive Gauze Dressing, 4x4 in (Generic) 1 x Per Day/15 Days ary Discharge Instructions: Apply over the collagen. Secondary Dressing: Woven Gauze Sponge, Non-Sterile 4x4 in (Generic) 1 x Per Day/15 Days Discharge Instructions: Apply over primary dressing as directed. Secured With: Child psychotherapist, Sterile 2x75 (in/in) (Generic) 1 x Per Day/15 Days Discharge Instructions: Secure with stretch gauze as directed. Secured With: 24M Medipore H Soft Cloth Surgical T ape, 4 x 10 (in/yd) (Generic) 1 x Per Day/15 Days Discharge Instructions: Secure with tape as directed. Secured With: [employer] Size 2, 10 (yds) (Generic) 1 x Per Day/15 Days Electronic Signature(s) Signed: 03/12/2022 6:42:42 PM By: Worthy Keeler PA-C Signed: 03/12/2022 6:57:47 PM By: Deon Pilling RN, BSN Entered By: Deon Pilling on 03/12/2022 09:35:37 -------------------------------------------------------------------------------- Problem List Details Patient Name: Date of Service: [name], A LFO RD 03/12/2022 9:15 A M Medical Record Number: [id number] Patient Account Number: [id number] Date of Birth/Sex: Treating RN: [birth year] (81 y.o. Erie Noe Primary Care Provider: Mackie Pai Other Clinician: Referring Provider: Treating  Provider/Extender: Sheran Lawless in Treatment: 10 Active Problems ICD-10 Encounter Code Description Active Date MDM Diagnosis I73.89 Other specified peripheral vascular diseases 01/01/2022 No Yes L97.522 Non-pressure chronic ulcer of other part of left foot with fat layer exposed 01/01/2022 No Yes L97.524 Non-pressure chronic ulcer of other part of left foot with necrosis of bone 02/12/2022 No Yes Lopiccolo, Blaize ([id number]) [id number] Page 5 of 8 R73.03 Prediabetes 01/01/2022 No Yes I10 Essential (primary) hypertension 01/01/2022 No Yes Inactive Problems Resolved Problems Electronic Signature(s) Signed: 03/12/2022 9:14:50 AM By: Worthy Keeler PA-C Entered By: Worthy Keeler on 03/12/2022 09:14:49 -------------------------------------------------------------------------------- Progress Note Details Patient Name: Date of Service: [name] LFO RD 03/12/2022 9:15 A M Medical Record Number: [id number] Patient Account Number: [id number] Date of Birth/Sex: Treating RN: 1940-06-02 (81 y.o. Erie Noe Primary Care Provider: Mackie Pai Other Clinician: Referring Provider: Treating Provider/Extender: Sheran Lawless in Treatment: 10 Subjective Chief Complaint Information obtained from Patient Left 1st and 2nd toe ulcers History of Present Illness (HPI) 01-01-2022 upon evaluation today patient appears to be doing poorly in regard to her wound that is on the left great toe and second toe location. The toenail of the second toe covers over a portion of the wound which I am concerned may be hiding a piece of exposed bone based on what I see today. The patient does have a history of peripheral vascular disease he is a patient of Dr. Alvester Chou who did an abdominal aortogram previous and was unable to intervene with regard to the lower  extremity blood flow. The patient had a right ABI which was noncompressible and a  left ABI was 0.6 with TBI's of 0.26 unfortunately. The TBI on the right was 0.62 which was not nearly as bad. Nonetheless the unfortunate thing is that with poor blood flow and potential for bone exposure this becomes increasingly complicated if the patient does have osteomyelitis to get this to heal. He does have repeat vascular testing on Monday upcoming. The patient is stated to be "prediabetic" according to notes and the patient. Currently he has been on Keflex. Patient has a history of peripheral vascular disease, prediabetes, hypertension, and unfortunately the current wound which has been present for 4 weeks based on what they tell me today. 01-08-2022 upon evaluation today patient appears to be doing about the same in regard to his wound. Again the x-ray was negative that was performed on 17th everything looks to be okay. With that being said he is continuing to have issues here with poor blood flow and perfusion he did have a repeat arterial study and ABI with TBI TBI could not even be obtained because the signals were so dampened. Previously he had been at 0.2. In regard to the ABI was previously around 0.65 he was now around 0.45. Again this was definitely a decrease on the left side compared to previous the right was stable. He is stated to have critical limb ischemia. 01-15-2022 upon evaluation today patient appears to be doing well currently in regard to his wounds all things considered. Were seeing more growth on the big toe versus the second toe but this is to be expected at the second toe does have some bone exposed. Fortunately I do not see any evidence of active infection at this time. He does have an appointment scheduled for September 11 which is Monday for an arteriogram and then subsequently the 12th for bypass surgery. Subsequently I think this is can be very good for him as far as trying to get this healed I believe it is good to be the best plan that we have as far as getting  good blood flow to allow for healing to continue. Once we get good blood flow I think we can then clean away some of the necrotic bone if necessary to try and allow this to heal more effectively. We also discussed briefly hyperbaric oxygen therapy and the possibility of distal toe amputation if need be but again right now we are still on the plantar stages of even knowing what we can do from a wound care perspective which I think is the first initial goal here. 01-22-2022 upon evaluation today patient appears to be doing better currently in regard to his wounds all things considered. He actually is good to be having his vascular procedures upcoming next week. Nonetheless I think once we get better blood flow will be able to see where things stand. He is also going to have his MRI on Sunday which I think will also be very beneficial for [place] to know exactly what is going on with the second toe in particular and the way we will get a proceed following. 02-12-2022 upon evaluation today patient appears to be doing well currently in regard to his wounds in fact he is showing signs of significant improvement which is great news. Fortunately I see no evidence of active infection locally or systemically at this time which is great news he has had his bypass on the left lower extremity and this does seem to  be doing much better compared to what we were previous. He is in fact about twice a small on the great toe as it was last time I saw him I am very pleased with where things stand with the second toe as well though there is still bone exposed there seems to be excellent blood flow which I think in turn is can allow this to actually heal that is what we want. 02-19-2022 upon evaluation today patient appears to be doing excellent in regard to his wounds. He is going require some sharp debridement but to be honest I think he should really do quite well with this. He did have a lot more bleeding from the second toe  after I did clean away some of the necrotic bone last week [place], [name] ([id number]) [phone].pdf Page 6 of 8 this week I think it should bleed nearly as much which should help and even more. 02-26-2022 patient appears to be doing well currently in regard to his wounds in fact the second toe looks like it could potentially even be healed although we will get a monitor 1 more week. The great toe is significantly smaller and is healing in an excellent fashion and very pleased in that regard. 10/18; the patient's second toe is healed as far as I can tell. The great toe has 2 open areas both of which look healthy and are measuring smaller. Primary dressing on the left first toe is collagen He has 5 days worth of Levaquin apparently has been on that for 2 months. I cannot see any reason to continue this at this point.He is 4 weeks status post his revascularization 03-12-2022 upon evaluation today patient appears to be doing excellent in regard to his wound in fact this is significantly smaller compared to previous. I am extremely pleased and I do believe that we are on the right track here. I am going to go ahead and perform some debridement today just to clearway some of the necrotic debris on the surface of the wound. He is done with the Levaquin. Objective Constitutional Well-nourished and well-hydrated in no acute distress. Vitals Time Taken: 9:02 AM, Temperature: 97.8 F, Pulse: 67 bpm, Respiratory Rate: 18 breaths/min, Blood Pressure: 124/65 mmHg. Respiratory normal breathing without difficulty. Psychiatric this patient is able to make decisions and demonstrates good insight into disease process. Alert and Oriented x 3. pleasant and cooperative. General Notes: Upon inspection patient's wound bed showed signs of excellent granulation and epithelization at this point. Fortunately I do not see any evidence of infection which is great news and overall I am extremely  pleased with where things stand today. Integumentary (Hair, Skin) Wound #1 status is Open. Original cause of wound was Trauma. The date acquired was: 12/01/2021. The wound has been in treatment 10 weeks. The wound is located on the Left T Great. The wound measures 0.3cm length x 0.6cm width x 0.1cm depth; 0.141cm^2 area and 0.014cm^3 volume. There is Fat Layer oe (Subcutaneous Tissue) exposed. There is no tunneling or undermining noted. There is a medium amount of serosanguineous drainage noted. The wound margin is distinct with the outline attached to the wound base. There is medium (34-66%) red, pink granulation within the wound bed. There is a medium (34-66%) amount of necrotic tissue within the wound bed. The periwound skin appearance had no abnormalities noted for color. The periwound skin appearance exhibited: Callus, Maceration. The periwound skin appearance did not exhibit: Crepitus, Excoriation. Periwound temperature was noted as No Abnormality. 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 There was a Excisional Skin/Subcutaneous Tissue Debridement with a total area of 1 sq cm performed by Worthy Keeler, PA. With the following instrument(s): Curette to remove Viable and Non-Viable tissue/material. Material removed includes Subcutaneous Tissue, Slough, Skin: Dermis, and Skin: Epidermis after achieving pain control using Lidocaine 5% topical ointment. A time out was conducted at 09:28, prior to the start of the procedure. A Minimum amount of bleeding was controlled with Pressure.  The procedure was tolerated well with a pain level of 0 throughout and a pain level of 0 following the procedure. Post Debridement Measurements: 0.3cm length x 0.6cm width x 0.1cm depth; 0.014cm^3 volume. Character of Wound/Ulcer Post Debridement is improved. Severity of Tissue Post Debridement is: Fat layer exposed. Post procedure Diagnosis Wound #1: Same as Pre-Procedure Plan [place], [place] ([id number]) [id number] Page 7 of 8 Follow-up Appointments: Return Appointment in 1 week. Margarita Grizzle, PA Wednesday Anesthetic: (In clinic) Topical Lidocaine 5% applied to wound bed Bathing/ Shower/ Hygiene: May shower with protection but do not get wound dressing(s) wet. Edema Control - Lymphedema / SCD / Other: Elevate legs to the level of the heart or above for 30 minutes daily and/or when sitting, a frequency of: - 3-4 times a day throughout the day. Avoid standing for long periods of time. Moisturize legs daily. Off-Loading: Open toe surgical shoe to: - left foot use while walking and standing. Other: - Keep pressure off of Left Foot/toes WOUND #1: - T Great Wound Laterality: Left oe Cleanser: Soap and Water 1 x Per Day/15 Days Discharge Instructions: May shower and wash wound with dial antibacterial soap and water prior to dressing change. Cleanser: Wound Cleanser (Generic) 1 x Per Day/15 Days Discharge Instructions: Cleanse the wound with wound cleanser prior to applying a clean dressing using gauze sponges, not tissue or cotton balls. Prim Dressing: Promogran Prisma Matrix, 4.34 (sq in) (silver collagen) 1 x Per Day/15 Days ary Discharge Instructions: Moisten collagen with saline or hydrogel Prim Dressing: Xeroform Occlusive Gauze Dressing, 4x4 in (Generic) 1 x Per Day/15 Days ary Discharge Instructions: Apply over the collagen. Secondary Dressing: Woven Gauze Sponge, Non-Sterile 4x4 in (Generic) 1 x Per Day/15 Days Discharge Instructions: Apply over primary dressing  as directed. Secured With: Child psychotherapist, Sterile 2x75 (in/in) (Generic) 1 x Per Day/15 Days Discharge Instructions: Secure with stretch gauze as directed. Secured With: 38M Medipore H Soft Cloth Surgical T ape, 4 x 10 (in/yd) (Generic) 1 x Per Day/15 Days Discharge Instructions: Secure with tape as directed. Secured With: Stretch Net Size 2, 10 (yds) (Generic) 1 x Per Day/15 Days 1. I am going to suggest that we continue with the wound care measures as before utilizing the silver collagen followed by Xeroform to keep this from drying out that is done a great job for him and I [name] continue as such. 2. I am also can recommend that we have the patient continue to monitor for any signs of worsening or infection obviously if anything changes he should let me know he is done with the Levaquin at this point and I think that is excellent. We will  see patient back for reevaluation in 1 week here in the clinic. If anything worsens or changes patient will contact our office for additional recommendations. Electronic Signature(s) Signed: 03/12/2022 10:35:41 AM By: Worthy Keeler PA-C Entered By: Worthy Keeler on 03/12/2022 10:35:41 -------------------------------------------------------------------------------- SuperBill Details Patient Name: Date of Service: [name] LFO RD 03/12/2022 Medical Record Number: [id number] Patient Account Number: [id number] Date of Birth/Sex: Treating RN: 1940/12/15 (81 y.o. 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

## 2022-03-13 NOTE — Progress Notes (Signed)
[place], [place] ([id number]) [id number] Page 1 of 6 Visit Report for 03/12/2022 Arrival Information Details Patient Name: Date of Service: [name] [facility] RD 03/12/2022 9:15 A M Medical Record Number: [id number] Patient Account Number: [id number] Date of Birth/Sex: Treating RN: 21-Jul-1940 (81 y.o. [name], [name] Primary Care Allyne Hebert: Mackie Pai Other Clinician: Referring Nance Mccombs: Treating Novalyn Lajara/Extender: Sheran Lawless in Treatment: 10 Visit Information History Since Last Visit Added or deleted any medications: No Patient Arrived: Ambulatory Any new allergies or adverse reactions: No Arrival Time: 08:58 Had a fall or experienced change in No Accompanied By: wife activities of daily living that may affect Transfer Assistance: None risk of falls: Patient Identification Verified: Yes Signs or symptoms of abuse/neglect since last visito No Secondary Verification Process Completed: Yes Hospitalized since last visit: No Patient Requires Transmission-Based Precautions: No Implantable device outside of the clinic excluding No Patient Has Alerts: Yes cellular tissue based products placed in the center Patient Alerts: ABI's:02/23 R: N/C L:0.6 since last visit: TBI's:02/23 R:0.62L:0.26 Has Dressing in Place as Prescribed: Yes Pain Present Now: No Electronic Signature(s) Signed: 03/13/2022 4:54:57 PM By: Erenest Blank Entered By: Erenest Blank on 03/12/2022 09:00:13 -------------------------------------------------------------------------------- Encounter Discharge Information Details Patient Name: Date of Service: [name], A LFO RD 03/12/2022 9:15 A M Medical Record Number: [id number] Patient Account Number: [id number] Date of Birth/Sex: Treating RN: July 24, 1940 (81 y.o. [name] Primary Care Kamel Haven: Mackie Pai Other Clinician: Referring Franklin Baumbach: Treating Zurii Hewes/Extender: Sheran Lawless in Treatment: 10 Encounter Discharge Information Items Post Procedure Vitals Discharge Condition: Stable Temperature (F): 97.8 Ambulatory Status: Ambulatory Pulse (bpm): 67 Discharge Destination: Home Respiratory Rate (breaths/min): 20 Transportation: Private Auto Blood Pressure (mmHg): 124/65 Accompanied By: family member Schedule Follow-up Appointment: Yes Clinical Summary of Care: Electronic Signature(s) Signed: 03/12/2022 6:57:47 PM By: Deon Pilling RN, BSN Entered By: Deon Pilling on 03/12/2022 09:37:06 [place], [place] ([id number]) [id number] Page 2 of 6 -------------------------------------------------------------------------------- Lower Extremity Assessment Details Patient Name: Date of Service: [name] LFO RD 03/12/2022 9:15 A M Medical Record Number: [id number] Patient Account Number: [id number] Date of Birth/Sex: Treating RN: [birth year] (81 y.o. [name], [name] Primary Care Soraida Vickers: Mackie Pai Other Clinician: Referring Dvante Hands: Treating Raine Blodgett/Extender: Sheran Lawless in Treatment: 10 Edema Assessment Assessed: Shirlyn Goltz: No] Patrice Paradise: No] Edema: [Left: Ye] [Right: s] Calf Left: Right: Point of Measurement: 37 cm From Medial Instep 41 cm Ankle Left: Right: Point of Measurement: 10 cm From Medial Instep 26.5 cm Vascular Assessment Pulses: Dorsalis Pedis Palpable: [Left:Yes] Electronic Signature(s) Signed: 03/13/2022 4:44:37 PM By: Rhae Hammock RN Signed: 03/13/2022 4:54:57 PM By: Erenest Blank Entered By: Erenest Blank on 03/12/2022 09:07:52 -------------------------------------------------------------------------------- Multi-Disciplinary Care Plan Details Patient Name: Date of Service: [name], A LFO RD 03/12/2022 9:15 A M Medical Record Number: [id number] Patient Account Number: [id number] Date of Birth/Sex: Treating RN: 1941-03-21 (81 y.o. [name] Primary Care  Lerae Langham: Mackie Pai Other Clinician: Referring Timmi Devora: Treating Alanna Storti/Extender: Sheran Lawless in Treatment: 10 Active Inactive Tissue Oxygenation Nursing Diagnoses: Actual ineffective tissue perfusion; peripheral (select once diagnosis is confirmed) Knowledge deficit related to disease process and management Potential alteration in peripheral tissue perfusion (select prior to confirmation of diagnosis) Goals: Invasive arterial studies completed as ordered Date Initiated: 01/01/2022 Date Inactivated: 03/12/2022 Target Resolution Date: 03/15/2022 Goal Status: Met Non-invasive arterial studies are completed as ordered Date Initiated: 01/01/2022 Date Inactivated: 03/12/2022 Target Resolution Date: 03/15/2022 DERYL, GIROUX ([id number]) [phone].pdf  Page 3 of 6 Goal Status: Met Patient/caregiver will verbalize understanding of disease process and disease management Date Initiated: 01/01/2022 Target Resolution Date: 04/11/2022 Goal Status: Active Interventions: Assess patient understanding of disease process and management upon diagnosis and as needed Assess peripheral arterial status upon admission and as needed Provide education on tissue oxygenation and ischemia Notes: Wound/Skin Impairment Nursing Diagnoses: Impaired tissue integrity Knowledge deficit related to ulceration/compromised skin integrity Goals: Patient will have a decrease in wound volume by X% from date: (specify in notes) Date Initiated: 01/01/2022 Target Resolution Date: 03/15/2022 Goal Status: Active Patient/caregiver will verbalize understanding of skin care regimen Date Initiated: 01/01/2022 Date Inactivated: 03/12/2022 Target Resolution Date: 03/15/2022 Goal Status: Met Ulcer/skin breakdown will have a volume reduction of 30% by week 4 Date Initiated: 01/01/2022 Date Inactivated: 03/12/2022 Target Resolution Date: 03/15/2022 Goal Status:  Met Interventions: Assess patient/caregiver ability to obtain necessary supplies Assess patient/caregiver ability to perform ulcer/skin care regimen upon admission and as needed Assess ulceration(s) every visit Notes: Electronic Signature(s) Signed: 03/12/2022 6:57:47 PM By: Deon Pilling RN, BSN Entered By: Deon Pilling on 03/12/2022 09:31:11 -------------------------------------------------------------------------------- Pain Assessment Details Patient Name: Date of Service: [name], A LFO RD 03/12/2022 9:15 A M Medical Record Number: [id number] Patient Account Number: [id number] Date of Birth/Sex: Treating RN: 03/12/1941 (81 y.o. Erie Noe Primary Care Elijah Phommachanh: Mackie Pai Other Clinician: Referring Zeffie Bickert: Treating Habeeb Puertas/Extender: Sheran Lawless in Treatment: 10 Active Problems Location of Pain Severity and Description of Pain Patient Has Paino No Site Locations [place], [place] ([id number]) [phone].pdf Page 4 of 6 Pain Management and Medication Current Pain Management: Electronic Signature(s) Signed: 03/13/2022 4:44:37 PM By: Rhae Hammock RN Signed: 03/13/2022 4:54:57 PM By: Erenest Blank Entered By: Erenest Blank on 03/12/2022 09:02:39 -------------------------------------------------------------------------------- Patient/Caregiver Education Details Patient Name: Date of Service: [name] LFO RD 10/25/2023andnbsp9:15 A M Medical Record Number: [id number] Patient Account Number: [id number] Date of Birth/Gender: Treating RN: 04/08/1941 (81 y.o. [name] Primary Care Physician: Mackie Pai Other Clinician: Referring Physician: Treating Physician/Extender: Sheran Lawless in Treatment: 10 Education Assessment Education Provided To: Patient Education Topics Provided Tissue Oxygenation: Handouts: Peripheral Arterial Disease and Related Ulcers Methods:  Explain/Verbal Responses: Reinforcements needed Electronic Signature(s) Signed: 03/12/2022 6:57:47 PM By: Deon Pilling RN, BSN Entered By: Deon Pilling on 03/12/2022 09:31:29 -------------------------------------------------------------------------------- Wound Assessment Details Patient Name: Date of Service: [name], A LFO RD 03/12/2022 9:15 A M Medical Record Number: [id number] Patient Account Number: [id number] Date of Birth/Sex: Treating RN: 06-04-1940 (81 y.o. Erie Noe [place], [place] ([id number]) [id number] Page 5 of 6 Primary Care Laurajean Hosek: Mackie Pai Other Clinician: Referring Osker Ayoub: Treating Swan [place]/Extender: Sheran Lawless in Treatment: 10 Wound Status Wound Number: 1 Primary Arterial Insufficiency Ulcer Etiology: Wound Location: Left T Great oe Wound Open Wounding Event: Trauma Status: Date Acquired: 12/01/2021 Comorbid Coronary Artery Disease, Hypotension, Peripheral Arterial Weeks Of Treatment: 10 History: Disease, Peripheral Venous Disease, Type II Diabetes, Clustered Wound: No Osteoarthritis Photos Wound Measurements Length: (cm) 0.3 Width: (cm) 0.6 Depth: (cm) 0.1 Area: (cm) 0.141 Volume: (cm) 0.014 % Reduction in Area: 98.6% % Reduction in Volume: 99.3% Epithelialization: Medium (34-66%) Tunneling: No Undermining: No Wound Description Classification: Full Thickness With Exposed Suppo Wound Margin: Distinct, outline attached Exudate Amount: Medium Exudate Type: Serosanguineous Exudate Color: red, brown rt Structures Foul Odor After Cleansing: No Slough/Fibrino Yes Wound Bed Granulation Amount: Medium (34-66%) Exposed Structure Granulation Quality: Red, Pink Fascia Exposed: No Necrotic Amount: Medium (  34-66%) Fat Layer (Subcutaneous Tissue) Exposed: Yes Tendon Exposed: No Muscle Exposed: No Joint Exposed: No Bone Exposed: No Periwound Skin Texture Texture Color No  Abnormalities Noted: No No Abnormalities Noted: Yes Callus: Yes Temperature / Pain Crepitus: No Temperature: No Abnormality Excoriation: No Moisture No Abnormalities Noted: No Maceration: Yes Treatment Notes Wound #1 (Toe Great) Wound Laterality: Left Cleanser Soap and Water Discharge Instruction: May shower and wash wound with dial antibacterial soap and water prior to dressing change. Wound Cleanser Discharge Instruction: Cleanse the wound with wound cleanser prior to applying a clean dressing using gauze sponges, not tissue or cotton balls. Peri-Wound Care Topical [place], [place] ([id number]) [id number] Page 6 of 6 Primary Dressing Promogran Prisma Matrix, 4.34 (sq in) (silver collagen) Discharge Instruction: Moisten collagen with saline or hydrogel Xeroform Occlusive Gauze Dressing, 4x4 in Discharge Instruction: Apply over the collagen. Secondary Dressing Woven Gauze Sponge, Non-Sterile 4x4 in Discharge Instruction: Apply over primary dressing as directed. Secured With Conforming Stretch Gauze Bandage, Sterile 2x75 (in/in) Discharge Instruction: Secure with stretch gauze as directed. 35M Medipore H Soft Cloth Surgical T ape, 4 x 10 (in/yd) Discharge Instruction: Secure with tape as directed. Stretch Net Size 2, 10 (yds) Compression Wrap Compression Stockings Add-Ons Electronic Signature(s) Signed: 03/13/2022 4:44:37 PM By: Rhae Hammock RN Signed: 03/13/2022 4:54:57 PM By: Erenest Blank Entered By: Erenest Blank on 03/12/2022 09:09:03 -------------------------------------------------------------------------------- Vitals Details Patient Name: Date of Service: [name], A LFO RD 03/12/2022 9:15 A M Medical Record Number: [id number] Patient Account Number: [id number] Date of Birth/Sex: Treating RN: 07-02-1940 (81 y.o. Erie Noe Primary Care Gift Rueckert: Mackie Pai Other Clinician: Referring Tramya Schoenfelder: Treating Traci Gafford/Extender:  Sheran Lawless in Treatment: 10 Vital Signs Time Taken: 09:02 Temperature (F): 97.8 Pulse (bpm): 67 Respiratory Rate (breaths/min): 18 Blood Pressure (mmHg): 124/65 Reference Range: 80 - 120 mg / dl Electronic Signature(s) Signed: 03/13/2022 4:54:57 PM By: Erenest Blank Entered By: Erenest Blank on 03/12/2022 09:02:31

## 2022-03-19 ENCOUNTER — Encounter (HOSPITAL_BASED_OUTPATIENT_CLINIC_OR_DEPARTMENT_OTHER): Payer: Medicare HMO | Attending: Physician Assistant | Admitting: Physician Assistant

## 2022-03-19 DIAGNOSIS — L97524 Non-pressure chronic ulcer of other part of left foot with necrosis of bone: Secondary | ICD-10-CM | POA: Diagnosis not present

## 2022-03-19 DIAGNOSIS — I7389 Other specified peripheral vascular diseases: Secondary | ICD-10-CM | POA: Diagnosis present

## 2022-03-19 DIAGNOSIS — I10 Essential (primary) hypertension: Secondary | ICD-10-CM | POA: Diagnosis not present

## 2022-03-19 DIAGNOSIS — L97522 Non-pressure chronic ulcer of other part of left foot with fat layer exposed: Secondary | ICD-10-CM | POA: Diagnosis not present

## 2022-03-19 DIAGNOSIS — R7303 Prediabetes: Secondary | ICD-10-CM | POA: Insufficient documentation

## 2022-03-19 NOTE — Progress Notes (Addendum)
[place], [place] ([id number]) [id number] Page 1 of 8 Visit Report for 03/19/2022 Chief Complaint Document Details Patient Name: Date of Service: [name] [facility] RD 03/19/2022 9:15 A M Medical Record Number: [id number] Patient Account Number: [id number] Date of Birth/Sex: Treating RN: 1940/12/17 (81 y.o. M) Primary Care Provider: [name] Other Clinician: Referring Provider: Treating Provider/Extender: [name] in Treatment: 11 Information Obtained from: Patient Chief Complaint Left 1st and 2nd toe ulcers Electronic Signature(s) Signed: 03/19/2022 9:25:01 AM By: Richard Kelp PA-C Entered By: [name] on 03/19/2022 09:25:01 -------------------------------------------------------------------------------- Debridement Details Patient Name: Date of Service: [name], A LFO RD 03/19/2022 9:15 A M Medical Record Number: [id number] Patient Account Number: [id number] Date of Birth/Sex: Treating RN: [birth year] (81 y.o. [name], [name] Primary Care Provider: [name] Other Clinician: Referring Provider: Treating Provider/Extender: [name] in Treatment: 11 Debridement Performed for Assessment: Wound #1 Left T Great oe Performed By: Physician Richard Kelp, PA Debridement Type: Debridement Severity of Tissue Pre Debridement: Fat layer exposed Level of Consciousness (Pre-procedure): Awake and Alert Pre-procedure Verification/Time Out Yes - 09:35 Taken: Start Time: 09:35 Pain Control: Lidocaine T Area Debrided (L x W): otal 0.2 (cm) x 0.1 (cm) = 0.02 (cm) Tissue and other material debrided: Viable, Non-Viable, Callus, Slough, Subcutaneous, Slough Level: Skin/Subcutaneous Tissue Debridement Description: Excisional Instrument: Curette Bleeding: Minimum Hemostasis Achieved: Pressure End Time: 09:35 Procedural Pain: 0 Post Procedural Pain: 0 Response to Treatment: Procedure was  tolerated well Level of Consciousness (Post- Awake and Alert procedure): Post Debridement Measurements of Total Wound Length: (cm) 0.2 Width: (cm) 0.1 Depth: (cm) 0.1 Volume: (cm) 0.002 Character of Wound/Ulcer Post Debridement: Improved Severity of Tissue Post Debridement: Fat layer exposed [place], [place] ([id number]) [id number] Page 2 of 8 Post Procedure Diagnosis Same as Pre-procedure Electronic Signature(s) Signed: 03/19/2022 6:14:51 PM By: Richard Kelp PA-C Signed: 03/25/2022 3:49:22 PM By: Richard Mu RN Entered By: [name] on 03/19/2022 09:37:11 -------------------------------------------------------------------------------- HPI Details Patient Name: Date of Service: [name], A LFO RD 03/19/2022 9:15 A M Medical Record Number: [id number] Patient Account Number: [id number] Date of Birth/Sex: Treating RN: 22-Apr-1941 (81 y.o. M) Primary Care Provider: [name] Other Clinician: Referring Provider: Treating Provider/Extender: [name] in Treatment: 11 History of Present Illness HPI Description: 01-01-2022 upon evaluation today patient appears to be doing poorly in regard to her wound that is on the left great toe and second toe location. The toenail of the second toe covers over a portion of the wound which I am concerned may be hiding a piece of exposed bone based on what I see today. The patient does have a history of peripheral vascular disease he is a patient of Dr. Gery Pray who did an abdominal aortogram previous and was unable to intervene with regard to the lower extremity blood flow. The patient had a right ABI which was noncompressible and a left ABI was 0.6 with TBI's of 0.26 unfortunately. The TBI on the right was 0.62 which was not nearly as bad. Nonetheless the unfortunate thing is that with poor blood flow and potential for bone exposure this becomes increasingly complicated if the patient does  have osteomyelitis to get this to heal. He does have repeat vascular testing on Monday upcoming. The patient is stated to be "prediabetic" according to notes and the patient. Currently he has been on Keflex. Patient has a history of peripheral vascular disease, prediabetes, hypertension, and unfortunately the  current wound which has been present for 4 weeks based on what they tell me today. 01-08-2022 upon evaluation today patient appears to be doing about the same in regard to his wound. Again the x-ray was negative that was performed on 17th everything looks to be okay. With that being said he is continuing to have issues here with poor blood flow and perfusion he did have a repeat arterial study and ABI with TBI TBI could not even be obtained because the signals were so dampened. Previously he had been at 0.2. In regard to the ABI was previously around 0.65 he was now around 0.45. Again this was definitely a decrease on the left side compared to previous the right was stable. He is stated to have critical limb ischemia. 01-15-2022 upon evaluation today patient appears to be doing well currently in regard to his wounds all things considered. Were seeing more growth on the big toe versus the second toe but this is to be expected at the second toe does have some bone exposed. Fortunately I do not see any evidence of active infection at this time. He does have an appointment scheduled for September 11 which is Monday for an arteriogram and then subsequently the 12th for bypass surgery. Subsequently I think this is can be very good for him as far as trying to get this healed I believe it is good to be the best plan that we have as far as getting good blood flow to allow for healing to continue. Once we get good blood flow I think we can then clean away some of the necrotic bone if necessary to try and allow this to heal more effectively. We also discussed briefly hyperbaric oxygen therapy and the  possibility of distal toe amputation if need be but again right now we are still on the plantar stages of even knowing what we can do from a wound care perspective which I think is the first initial goal here. 01-22-2022 upon evaluation today patient appears to be doing better currently in regard to his wounds all things considered. He actually is good to be having his vascular procedures upcoming next week. Nonetheless I think once we get better blood flow will be able to see where things stand. He is also going to have his MRI on Sunday which I think will also be very beneficial for [place] to know exactly what is going on with the second toe in particular and the way we will get a proceed following. 02-12-2022 upon evaluation today patient appears to be doing well currently in regard to his wounds in fact he is showing signs of significant improvement which is great news. Fortunately I see no evidence of active infection locally or systemically at this time which is great news he has had his bypass on the left lower extremity and this does seem to be doing much better compared to what we were previous. He is in fact about twice a small on the great toe as it was last time I saw him I am very pleased with where things stand with the second toe as well though there is still bone exposed there seems to be excellent blood flow which I think in turn is can allow this to actually heal that is what we want. 02-19-2022 upon evaluation today patient appears to be doing excellent in regard to his wounds. He is going require some sharp debridement but to be honest I think he should really do quite well  with this. He did have a lot more bleeding from the second toe after I did clean away some of the necrotic bone last week this week I think it should bleed nearly as much which should help and even more. 02-26-2022 patient appears to be doing well currently in regard to his wounds in fact the second toe looks like it  could potentially even be healed although we will get a monitor 1 more week. The great toe is significantly smaller and is healing in an excellent fashion and very pleased in that regard. 10/18; the patient's second toe is healed as far as I can tell. The great toe has 2 open areas both of which look healthy and are measuring smaller. Primary dressing on the left first toe is collagen He has 5 days worth of Levaquin apparently has been on that for 2 months. I cannot see any reason to continue this at this point.He is 4 weeks status post his revascularization 03-12-2022 upon evaluation today patient appears to be doing excellent in regard to his wound in fact this is significantly smaller compared to previous. I am extremely pleased and I do believe that we are on the right track here. I am going to go ahead and perform some debridement today just to clearway some of the necrotic debris on the surface of the wound. He is done with the Levaquin. 03-19-2022 upon evaluation today patient appears to be doing well currently in regard to his wound on the great toe. Fortunately there does not appear to be any [place], [place] ([id number]) [id number] Page 3 of 8 signs of infection which is great news and overall I am extremely pleased with where we stand today. I think that he is very close to complete resolution there is some need for sharp debridement today. Electronic Signature(s) Signed: 03/19/2022 9:48:39 AM By: Richard Kelp PA-C Entered By: [name] on 03/19/2022 09:48:39 -------------------------------------------------------------------------------- Physical Exam Details Patient Name: Date of Service: [name] LFO RD 03/19/2022 9:15 A M Medical Record Number: [id number] Patient Account Number: [id number] Date of Birth/Sex: Treating RN: [birth year] (81 y.o. M) Primary Care Provider: [name] Other Clinician: Referring Provider: Treating Provider/Extender:  [name] in Treatment: 11 Constitutional Well-nourished and well-hydrated in no acute distress. Respiratory normal breathing without difficulty. Psychiatric this patient is able to make decisions and demonstrates good insight into disease process. Alert and Oriented x 3. pleasant and cooperative. Notes Patient's wound bed did require sharp debridement clearway some of the necrotic debris and callus postdebridement this is significantly smaller and appears to be doing excellent I am extremely pleased with where we stand today. He is very close to complete closure. Electronic Signature(s) Signed: 03/19/2022 9:49:43 AM By: Richard Kelp PA-C Previous Signature: 03/19/2022 9:48:58 AM Version By: Richard Kelp PA-C Entered By: [name] on 03/19/2022 09:49:43 -------------------------------------------------------------------------------- Physician Orders Details Patient Name: Date of Service: [name], A LFO RD 03/19/2022 9:15 A M Medical Record Number: [id number] Patient Account Number: [id number] Date of Birth/Sex: Treating RN: 1940-05-20 (81 y.o. 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 [name], [name] Wednesday (Dr. Dellia Nims covering) Return [place], [place] ([id number]) [id number] Page 4 of 8 Anesthetic (In clinic) Topical Lidocaine 5% applied to wound bed Bathing/ Shower/ Hygiene May shower with  protection but do not get wound dressing(s) wet. Edema Control - Lymphedema / SCD / Other Elevate legs to the level of the heart or above for 30 minutes daily and/or when sitting, a frequency of: - 3-4 times a day throughout the day. Avoid standing for long periods of time. Moisturize legs daily. Off-Loading Open toe surgical shoe to: - left foot use while walking and standing. Other: - Keep pressure off of Left Foot/toes Wound Treatment Wound #1 - T Great oe Wound Laterality: Left Cleanser: Soap and Water 1 x Per Day/15 Days Discharge Instructions: May shower and wash wound with dial antibacterial soap and water prior to dressing change. Cleanser: Wound Cleanser (Generic) 1 x Per Day/15 Days Discharge Instructions: Cleanse the wound with wound cleanser prior to applying a clean dressing using gauze sponges, not tissue or cotton balls. Prim Dressing: Promogran Prisma Matrix, 4.34 (sq in) (silver collagen) 1 x Per Day/15 Days ary Discharge Instructions: Moisten collagen with saline or hydrogel Prim Dressing: Xeroform Occlusive Gauze Dressing, 4x4 in (Generic) 1 x Per Day/15 Days ary Discharge Instructions: Apply over the collagen. Secondary Dressing: Woven Gauze Sponge, Non-Sterile 4x4 in (Generic) 1 x Per Day/15 Days Discharge Instructions: Apply over primary dressing as directed. Secured With: Child psychotherapist, Sterile 2x75 (in/in) (Generic) 1 x Per Day/15 Days Discharge Instructions: Secure with stretch gauze as directed. Secured With: 63M Medipore H Soft Cloth Surgical T ape, 4 x 10 (in/yd) (Generic) 1 x Per Day/15 Days Discharge Instructions: Secure with tape as directed. Secured With: [employer] Size 2, 10 (yds) (Generic) 1 x Per Day/15 Days Compression Wrap: Medigrip Elasticated Tubular Support Bandage, Size D, 3 (in) 1 x Per QMV/78 Days Electronic Signature(s) Signed: 03/19/2022 6:14:51 PM By: Worthy Keeler PA-C Signed: 03/25/2022 3:49:22 PM By: Richard Hammock  RN Entered By: [name] on 03/19/2022 09:41:33 -------------------------------------------------------------------------------- Problem List Details Patient Name: Date of Service: [name], A LFO RD 03/19/2022 9:15 A M Medical Record Number: [id number] Patient Account Number: [id number] Date of Birth/Sex: Treating RN: 11-06-1940 (81 y.o. M) Primary Care Provider: Mackie Pai Other Clinician: Referring Provider: Treating Provider/Extender: Sheran Lawless in Treatment: 11 Active Problems ICD-10 Encounter Code Description Active Date MDM Diagnosis I73.89 Other specified peripheral vascular diseases 01/01/2022 No Yes L97.522 Non-pressure chronic ulcer of other part of left foot with fat layer exposed 01/01/2022 No Yes [place], [place] ([id number]) [id number] Page 5 of 8 [id number] Non-pressure chronic ulcer of other part of left foot with necrosis of bone 02/12/2022 No Yes R73.03 Prediabetes 01/01/2022 No Yes I10 Essential (primary) hypertension 01/01/2022 No Yes Inactive Problems Resolved Problems Electronic Signature(s) Signed: 03/19/2022 9:24:37 AM By: Worthy Keeler PA-C Entered By: Worthy Keeler on 03/19/2022 09:24:36 -------------------------------------------------------------------------------- Progress Note Details Patient Name: Date of Service: [name], A LFO RD 03/19/2022 9:15 A M Medical Record Number: [id number] Patient Account Number: [id number] Date of Birth/Sex: Treating RN: [birth year] (81 y.o. M) Primary Care Provider: Mackie Pai Other Clinician: Referring Provider: Treating Provider/Extender: Sheran Lawless in Treatment: 11 Subjective Chief Complaint Information obtained from Patient Left 1st and 2nd toe ulcers History of Present Illness (HPI) 01-01-2022 upon evaluation today patient appears to be doing poorly in regard  to her wound that is on the left great toe and second toe  location. The toenail of the second toe covers over a portion of the wound which I am concerned may be hiding a piece of exposed bone based on what I see today. The patient does have a history of peripheral vascular disease he is a patient of Dr. Gery Pray who did an abdominal aortogram previous and was unable to intervene with regard to the lower extremity blood flow. The patient had a right ABI which was noncompressible and a left ABI was 0.6 with TBI's of 0.26 unfortunately. The TBI on the right was 0.62 which was not nearly as bad. Nonetheless the unfortunate thing is that with poor blood flow and potential for bone exposure this becomes increasingly complicated if the patient does have osteomyelitis to get this to heal. He does have repeat vascular testing on Monday upcoming. The patient is stated to be "prediabetic" according to notes and the patient. Currently he has been on Keflex. Patient has a history of peripheral vascular disease, prediabetes, hypertension, and unfortunately the current wound which has been present for 4 weeks based on what they tell me today. 01-08-2022 upon evaluation today patient appears to be doing about the same in regard to his wound. Again the x-ray was negative that was performed on 17th everything looks to be okay. With that being said he is continuing to have issues here with poor blood flow and perfusion he did have a repeat arterial study and ABI with TBI TBI could not even be obtained because the signals were so dampened. Previously he had been at 0.2. In regard to the ABI was previously around 0.65 he was now around 0.45. Again this was definitely a decrease on the left side compared to previous the right was stable. He is stated to have critical limb ischemia. 01-15-2022 upon evaluation today patient appears to be doing well currently in regard to his wounds all things considered. Were seeing more growth on the big toe versus the second toe but this is to be  expected at the second toe does have some bone exposed. Fortunately I do not see any evidence of active infection at this time. He does have an appointment scheduled for September 11 which is Monday for an arteriogram and then subsequently the 12th for bypass surgery. Subsequently I think this is can be very good for him as far as trying to get this healed I believe it is good to be the best plan that we have as far as getting good blood flow to allow for healing to continue. Once we get good blood flow I think we can then clean away some of the necrotic bone if necessary to try and allow this to heal more effectively. We also discussed briefly hyperbaric oxygen therapy and the possibility of distal toe amputation if need be but again right now we are still on the plantar stages of even knowing what we can do from a wound care perspective which I think is the first initial goal here. 01-22-2022 upon evaluation today patient appears to be doing better currently in regard to his wounds all things considered. He actually is good to be having his vascular procedures upcoming next week. Nonetheless I think once we get better blood flow will be able to see where things stand. He is also going to have his MRI on Sunday which I think will also be very beneficial for [place] to know exactly what is going  on with the second toe in particular and the way we will get a proceed following. 02-12-2022 upon evaluation today patient appears to be doing well currently in regard to his wounds in fact he is showing signs of significant improvement which is great news. Fortunately I see no evidence of active infection locally or systemically at this time which is great news he has had his bypass on the left lower [place] [name] ([id number]) [id number] Page 6 of 8 extremity and this does seem to be doing much better compared to what we were previous. He is in fact about twice a small on the great toe as it  was last time I saw him I am very pleased with where things stand with the second toe as well though there is still bone exposed there seems to be excellent blood flow which I think in turn is can allow this to actually heal that is what we want. 02-19-2022 upon evaluation today patient appears to be doing excellent in regard to his wounds. He is going require some sharp debridement but to be honest I think he should really do quite well with this. He did have a lot more bleeding from the second toe after I did clean away some of the necrotic bone last week this week I think it should bleed nearly as much which should help and even more. 02-26-2022 patient appears to be doing well currently in regard to his wounds in fact the second toe looks like it could potentially even be healed although we will get a monitor 1 more week. The great toe is significantly smaller and is healing in an excellent fashion and very pleased in that regard. 10/18; the patient's second toe is healed as far as I can tell. The great toe has 2 open areas both of which look healthy and are measuring smaller. Primary dressing on the left first toe is collagen He has 5 days worth of Levaquin apparently has been on that for 2 months. I cannot see any reason to continue this at this point.He is 4 weeks status post his revascularization 03-12-2022 upon evaluation today patient appears to be doing excellent in regard to his wound in fact this is significantly smaller compared to previous. I am extremely pleased and I do believe that we are on the right track here. I am going to go ahead and perform some debridement today just to clearway some of the necrotic debris on the surface of the wound. He is done with the Levaquin. 03-19-2022 upon evaluation today patient appears to be doing well currently in regard to his wound on the great toe. Fortunately there does not appear to be any signs of infection which is great news and overall I am  extremely pleased with where we stand today. I think that he is very close to complete resolution there is some need for sharp debridement today. Objective Constitutional Well-nourished and well-hydrated in no acute distress. Vitals Time Taken: 8:45 AM, Temperature: 97.7 F, Pulse: 62 bpm, Respiratory Rate: 20 breaths/min, Blood Pressure: 120/67 mmHg. Respiratory normal breathing without difficulty. Psychiatric this patient is able to make decisions and demonstrates good insight into disease process. Alert and Oriented x 3. pleasant and cooperative. General Notes: Patient's wound bed did require sharp debridement clearway some of the necrotic debris and callus postdebridement this is significantly smaller and appears to be doing excellent I am extremely pleased with where we stand today. He is very close to complete closure. Integumentary (Hair, Skin)  Wound #1 status is Open. Original cause of wound was Trauma. The date acquired was: 12/01/2021. The wound has been in treatment 11 weeks. The wound is located on the Left T Great. The wound measures 0.2cm length x 0.1cm width x 0.1cm depth; 0.016cm^2 area and 0.002cm^3 volume. There is Fat Layer oe (Subcutaneous Tissue) exposed. There is no tunneling or undermining noted. There is a medium amount of serosanguineous drainage noted. The wound margin is distinct with the outline attached to the wound base. There is large (67-100%) red, pink granulation within the wound bed. There is no necrotic tissue within the wound bed. The periwound skin appearance had no abnormalities noted for color. The periwound skin appearance exhibited: Callus, Maceration. The periwound skin appearance did not exhibit: Crepitus, Excoriation. Periwound temperature was noted as No Abnormality. 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 There was a Excisional Skin/Subcutaneous Tissue Debridement with a total area of 0.02 sq cm performed by Richard Kelp, PA. With the following instrument(s): Curette to remove Viable and Non-Viable tissue/material. Material removed includes Callus, Subcutaneous Tissue, and Slough after achieving pain control using Lidocaine. No specimens were taken. A time out was conducted at 09:35, prior to the start of the procedure. A Minimum amount of bleeding was controlled with Pressure. The procedure was tolerated well with a pain level of 0 throughout and a pain level of 0 following the procedure. Post Debridement Measurements: 0.2cm length x 0.1cm width x 0.1cm depth; 0.002cm^3 volume. Character of Wound/Ulcer Post Debridement is improved. Severity of Tissue Post Debridement is: Fat layer exposed. [place], [place] ([id number]) [id number] Page 7 of 8 Post procedure Diagnosis Wound #1: Same as Pre-Procedure Plan Follow-up Appointments: Return Appointment in 1 week. Leonard Schwartz, PA Wednesday (Dr. Leanord Hawking covering) Anesthetic: (In clinic) Topical Lidocaine 5% applied to wound bed Bathing/ Shower/ Hygiene: May shower with protection but do not get wound dressing(s) wet. Edema Control - Lymphedema / SCD / Other: Elevate legs to the level of the heart or above for 30 minutes daily and/or when sitting, a frequency of: - 3-4 times a day throughout the day. Avoid standing for long periods of time. Moisturize legs daily. Off-Loading: Open toe surgical shoe to: - left foot use while walking and standing. Other: - Keep pressure off of Left Foot/toes WOUND #1: - T Great Wound Laterality: Left oe Cleanser: Soap and  Water 1 x Per Day/15 Days Discharge Instructions: May shower and wash wound with dial antibacterial soap and water prior to dressing change. Cleanser: Wound Cleanser (Generic) 1 x Per Day/15 Days Discharge Instructions: Cleanse the wound with wound cleanser prior to applying a clean dressing using gauze sponges, not tissue or cotton balls. Prim Dressing: Promogran Prisma Matrix, 4.34 (sq in) (silver collagen) 1 x Per Day/15 Days ary Discharge Instructions: Moisten collagen with saline or hydrogel Prim Dressing: Xeroform Occlusive Gauze Dressing, 4x4 in (Generic) 1 x Per Day/15 Days ary Discharge Instructions: Apply over the collagen. Secondary Dressing: Woven Gauze Sponge, Non-Sterile 4x4 in (Generic) 1 x Per Day/15 Days Discharge Instructions: Apply over primary dressing as directed. Secured With: Insurance underwriter, Sterile 2x75 (in/in) (Generic) 1 x  Per Day/15 Days Discharge Instructions: Secure with stretch gauze as directed. Secured With: 69M Medipore H Soft Cloth Surgical T ape, 4 x 10 (in/yd) (Generic) 1 x Per Day/15 Days Discharge Instructions: Secure with tape as directed. Secured With: [employer] Size 2, 10 (yds) (Generic) 1 x Per Day/15 Days Com pression Wrap: Medigrip Elasticated Tubular Support Bandage, Size D, 3 (in) 1 x Per Day/15 Days 1. I am going to recommend that we have the patient continue to monitor for any signs of worsening or infection. Obviously if anything changes he has contact the office and let me know. 2. I am also going to suggest that the patient should continue with the collagen followed by Xeroform gauze which I think is doing a good job here as well. We are very close to complete resolution. We will see patient back for reevaluation in 1 week here in the clinic. If anything worsens or changes patient will contact our office for additional recommendations. Electronic Signature(s) Signed: 03/19/2022 9:51:51 AM By: Richard Kelp PA-C Entered  By: [name] on 03/19/2022 09:51:51 -------------------------------------------------------------------------------- SuperBill Details Patient Name: Date of Service: [name], A LFO RD 03/19/2022 Medical Record Number: [id number] Patient Account Number: [id number] Date of Birth/Sex: Treating RN: 1940/11/12 (81 y.o. 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 R73.03 Prediabetes [place], [name] ([id number]) [id number] Page 8 of 8 I10 Essential (primary) hypertension Facility Procedures : CPT4 Code: 07867544 Description: 11042 - DEB SUBQ TISSUE 20 SQ CM/< ICD-10 Diagnosis Description L97.522 Non-pressure chronic ulcer of other part of left foot with fat layer exposed Modifier: Quantity: 1 Physician Procedures : CPT4 Code Description Modifier [id number] 11042 - WC PHYS SUBQ TISS 20 SQ CM ICD-10 Diagnosis Description L97.522 Non-pressure chronic ulcer of other part of left foot with fat layer exposed Quantity: 1 Electronic Signature(s) Signed: 03/19/2022 9:57:25 AM By: Richard Kelp PA-C Entered By: [name] on 03/19/2022 09:57:24

## 2022-03-25 NOTE — Progress Notes (Signed)
[place], [place] ([id number]) [id number] Page 1 of 6 Visit Report for 03/19/2022 Arrival Information Details Patient Name: Date of Service: [name] [facility] RD 03/19/2022 9:15 A M Medical Record Number: [id number] Patient Account Number: [id number] Date of Birth/Sex: Treating RN: 06/22/1940 (81 y.o. M) Primary Care Emie Sommerfeld: Mackie Pai Other Clinician: Referring Tijana Walder: Treating Shailyn Weyandt/Extender: Sheran Lawless in Treatment: 11 Visit Information History Since Last Visit All ordered tests and consults were completed: No Patient Arrived: Ambulatory Added or deleted any medications: No Arrival Time: 08:55 Any new allergies or adverse reactions: No Accompanied By: wife Had a fall or experienced change in No Transfer Assistance: None activities of daily living that may affect Patient Identification Verified: Yes risk of falls: Secondary Verification Process Completed: Yes Signs or symptoms of abuse/neglect since last visito No Patient Requires Transmission-Based Precautions: No Hospitalized since last visit: No Patient Has Alerts: Yes Implantable device outside of the clinic excluding No Patient Alerts: ABI's:02/23 R: N/C L:0.6 cellular tissue based products placed in the center TBI's:02/23 R:0.62L:0.26 since last visit: Pain Present Now: No Electronic Signature(s) Signed: 03/19/2022 9:56:05 AM By: Worthy Rancher Entered By: Worthy Rancher on 03/19/2022 08:55:55 -------------------------------------------------------------------------------- Encounter Discharge Information Details Patient Name: Date of Service: [name], A LFO RD 03/19/2022 9:15 A M Medical Record Number: [id number] Patient Account Number: [id number] Date of Birth/Sex: Treating RN: 06/05/1940 (81 y.o. Burnadette Pop, [name] Primary Care Tyqwan Pink: Mackie Pai Other Clinician: Referring [place]: Treating Isaly Fasching/Extender: Sheran Lawless in  Treatment: 11 Encounter Discharge Information Items Post Procedure Vitals Discharge Condition: Stable Temperature (F): 98.7 Ambulatory Status: Ambulatory Pulse (bpm): 74 Discharge Destination: Home Respiratory Rate (breaths/min): 17 Transportation: Private Auto Blood Pressure (mmHg): 120/80 Accompanied By: wife Schedule Follow-up Appointment: Yes Clinical Summary of Care: Patient Declined Electronic Signature(s) Signed: 03/25/2022 3:49:22 PM By: Rhae Hammock RN Entered By: Rhae Hammock on 03/19/2022 09:45:00 [place], [place] ([id number]) [id number] Page 2 of 6 -------------------------------------------------------------------------------- Lower Extremity Assessment Details Patient Name: Date of Service: Richard Ala LFO RD 03/19/2022 9:15 A M Medical Record Number: [id number] Patient Account Number: [id number] Date of Birth/Sex: Treating RN: 1941-04-24 (81 y.o. M) Primary Care Jacoba Cherney: Mackie Pai Other Clinician: Referring Alantra Popoca: Treating Braedin Millhouse/Extender: Sheran Lawless in Treatment: 11 Edema Assessment Assessed: Shirlyn Goltz: No] Patrice Paradise: No] Edema: [Left: Ye] [Right: s] Calf Left: Right: Point of Measurement: 37 cm From Medial Instep 38 cm Ankle Left: Right: Point of Measurement: 10 cm From Medial Instep 22 cm Vascular Assessment Pulses: Dorsalis Pedis Palpable: [Left:Yes] Electronic Signature(s) Signed: 03/20/2022 11:40:36 AM By: Erenest Blank Entered By: Erenest Blank on 03/19/2022 09:06:44 -------------------------------------------------------------------------------- Multi-Disciplinary Care Plan Details Patient Name: Date of Service: [name], A LFO RD 03/19/2022 9:15 A M Medical Record Number: [id number] Patient Account Number: [id number] Date of Birth/Sex: Treating RN: 09-Aug-1940 (81 y.o. Erie Noe Primary Care Debby Clyne: Mackie Pai Other Clinician: Referring Adiva Boettner: Treating  Sita Mangen/Extender: Sheran Lawless in Treatment: 11 Active Inactive Tissue Oxygenation Nursing Diagnoses: Actual ineffective tissue perfusion; peripheral (select once diagnosis is confirmed) Knowledge deficit related to disease process and management Potential alteration in peripheral tissue perfusion (select prior to confirmation of diagnosis) Goals: Invasive arterial studies completed as ordered Date Initiated: 01/01/2022 Date Inactivated: 03/12/2022 Target Resolution Date: 03/15/2022 Goal Status: Met Non-invasive arterial studies are completed as ordered Date Initiated: 01/01/2022 Date Inactivated: 03/12/2022 Target Resolution Date: 03/15/2022 Goal Status: [name] ([id number]) [id number] Page 3 of 6 Patient/caregiver will verbalize understanding  of disease process and disease management Date Initiated: 01/01/2022 Target Resolution Date: 04/11/2022 Goal Status: Active Interventions: Assess patient understanding of disease process and management upon diagnosis and as needed Assess peripheral arterial status upon admission and as needed Provide education on tissue oxygenation and ischemia Notes: Wound/Skin Impairment Nursing Diagnoses: Impaired tissue integrity Knowledge deficit related to ulceration/compromised skin integrity Goals: Patient will have a decrease in wound volume by X% from date: (specify in notes) Date Initiated: 01/01/2022 Target Resolution Date: 04/19/2022 Goal Status: Active Patient/caregiver will verbalize understanding of skin care regimen Date Initiated: 01/01/2022 Date Inactivated: 03/12/2022 Target Resolution Date: 03/15/2022 Goal Status: Met Ulcer/skin breakdown will have a volume reduction of 30% by week 4 Date Initiated: 01/01/2022 Date Inactivated: 03/12/2022 Target Resolution Date: 03/15/2022 Goal Status: Met Interventions: Assess patient/caregiver ability to obtain necessary  supplies Assess patient/caregiver ability to perform ulcer/skin care regimen upon admission and as needed Assess ulceration(s) every visit Notes: Electronic Signature(s) Signed: 03/25/2022 3:49:22 PM By: Rhae Hammock RN Entered By: Rhae Hammock on 03/19/2022 09:38:13 -------------------------------------------------------------------------------- Pain Assessment Details Patient Name: Date of Service: [name], A LFO RD 03/19/2022 9:15 A M Medical Record Number: [id number] Patient Account Number: [id number] Date of Birth/Sex: Treating RN: December 18, 1940 (81 y.o. M) Primary Care Elvia Aydin: Mackie Pai Other Clinician: Referring Maneh Sieben: Treating Dayton Sherr/Extender: Sheran Lawless in Treatment: 11 Active Problems Location of Pain Severity and Description of Pain Patient Has Paino No Site Locations [place], [place] ([id number]) [phone].pdf Page 4 of 6 Pain Management and Medication Current Pain Management: Electronic Signature(s) Signed: 03/19/2022 9:56:05 AM By: Worthy Rancher Entered By: Worthy Rancher on 03/19/2022 08:56:29 -------------------------------------------------------------------------------- Patient/Caregiver Education Details Patient Name: Date of Service: [name], A LFO RD 11/1/2023andnbsp9:15 A M Medical Record Number: [id number] Patient Account Number: [id number] Date of Birth/Gender: Treating RN: 01/02/1941 (81 y.o. Erie Noe Primary Care Physician: Mackie Pai Other Clinician: Referring Physician: Treating Physician/Extender: Sheran Lawless in Treatment: 11 Education Assessment Education Provided To: Patient Education Topics Provided Tissue Oxygenation: Methods: Explain/Verbal Responses: State content correctly Electronic Signature(s) Signed: 03/25/2022 3:49:22 PM By: Rhae Hammock RN Entered By: Rhae Hammock on 03/19/2022  09:38:30 -------------------------------------------------------------------------------- Wound Assessment Details Patient Name: Date of Service: [name], A LFO RD 03/19/2022 9:15 A M Medical Record Number: [id number] Patient Account Number: [id number] Date of Birth/Sex: Treating RN: 1940/12/12 (81 y.o. M) Primary Care Sherra Kimmons: Mackie Pai Other Clinician: Referring Zan Triska: Treating Danilynn Jemison/Extender: Waldemar Dickens [place], [place] ([id number]) [phone].pdf Page 5 of 6 Weeks in Treatment: 11 Wound Status Wound Number: 1 Primary Arterial Insufficiency Ulcer Etiology: Wound Location: Left T Great oe Wound Open Wounding Event: Trauma Status: Date Acquired: 12/01/2021 Comorbid Coronary Artery Disease, Hypotension, Peripheral Arterial Weeks Of Treatment: 11 History: Disease, Peripheral Venous Disease, Type II Diabetes, Clustered Wound: No Osteoarthritis Photos Wound Measurements Length: (cm) 0.2 Width: (cm) 0.1 Depth: (cm) 0.1 Area: (cm) 0.016 Volume: (cm) 0.002 % Reduction in Area: 99.8% % Reduction in Volume: 99.9% Epithelialization: Medium (34-66%) Tunneling: No Undermining: No Wound Description Classification: Full Thickness With Exposed Suppo Wound Margin: Distinct, outline attached Exudate Amount: Medium Exudate Type: Serosanguineous Exudate Color: red, brown rt Structures Foul Odor After Cleansing: No Slough/Fibrino Yes Wound Bed Granulation Amount: Large (67-100%) Exposed Structure Granulation Quality: Red, Pink Fascia Exposed: No Necrotic Amount: None Present (0%) Fat Layer (Subcutaneous Tissue) Exposed: Yes Tendon Exposed: No Muscle Exposed: No Joint Exposed: No Bone Exposed: No Periwound Skin Texture Texture Color No Abnormalities Noted: No No Abnormalities  Noted: Yes Callus: Yes Temperature / Pain Crepitus: No Temperature: No Abnormality Excoriation: No Moisture No Abnormalities Noted: No Maceration:  Yes Treatment Notes Wound #1 (Toe Great) Wound Laterality: Left Cleanser Soap and Water Discharge Instruction: May shower and wash wound with dial antibacterial soap and water prior to dressing change. Wound Cleanser Discharge Instruction: Cleanse the wound with wound cleanser prior to applying a clean dressing using gauze sponges, not tissue or cotton balls. Peri-Wound Care Topical Primary Dressing [place], [place] ([id number]) [id number] Page 6 of 6 Promogran Prisma Matrix, 4.34 (sq in) (silver collagen) Discharge Instruction: Moisten collagen with saline or hydrogel Xeroform Occlusive Gauze Dressing, 4x4 in Discharge Instruction: Apply over the collagen. Secondary Dressing Woven Gauze Sponge, Non-Sterile 4x4 in Discharge Instruction: Apply over primary dressing as directed. Secured With Conforming Stretch Gauze Bandage, Sterile 2x75 (in/in) Discharge Instruction: Secure with stretch gauze as directed. 90M Medipore H Soft Cloth Surgical T ape, 4 x 10 (in/yd) Discharge Instruction: Secure with tape as directed. Stretch Net Size 2, 10 (yds) Compression Wrap Medigrip Elasticated Tubular Support Bandage, Size D, 3 (in) Compression Stockings Add-Ons Electronic Signature(s) Signed: 03/20/2022 11:40:36 AM By: Erenest Blank Entered By: Erenest Blank on 03/19/2022 09:07:11 -------------------------------------------------------------------------------- Vitals Details Patient Name: Date of Service: [name], A LFO RD 03/19/2022 9:15 A M Medical Record Number: [id number] Patient Account Number: [id number] Date of Birth/Sex: Treating RN: September 08, 1940 (81 y.o. M) Primary Care Marquel Spoto: Mackie Pai Other Clinician: Referring Ascencion Coye: Treating Jevaun Strick/Extender: Sheran Lawless in Treatment: 11 Vital Signs Time Taken: 08:45 Temperature (F): 97.7 Pulse (bpm): 62 Respiratory Rate (breaths/min): 20 Blood Pressure (mmHg): 120/67 Reference  Range: 80 - 120 mg / dl Electronic Signature(s) Signed: 03/19/2022 9:56:05 AM By: Worthy Rancher Entered By: Worthy Rancher on 03/19/2022 08:56:21

## 2022-03-26 ENCOUNTER — Encounter (HOSPITAL_BASED_OUTPATIENT_CLINIC_OR_DEPARTMENT_OTHER): Payer: Medicare HMO | Admitting: Internal Medicine

## 2022-03-26 DIAGNOSIS — I7389 Other specified peripheral vascular diseases: Secondary | ICD-10-CM | POA: Diagnosis not present

## 2022-03-26 NOTE — Progress Notes (Signed)
[place], [place] ([id number]) [id number] Page 1 of 5 Visit Report for 03/26/2022 HPI Details Patient Name: Date of Service: [name] LFO RD 03/26/2022 9:00 [name] M Medical Record Number: [id number] Patient Account Number: [id number] Date of Birth/Sex: Treating RN: Nov 05, 1940 (81 y.o. M) Primary Care Provider: [name] Other Clinician: Referring Provider: Treating Provider/Extender: Edwyna Ready in Treatment: 12 History of Present Illness HPI Description: 01-01-2022 upon evaluation today patient appears to be doing poorly in regard to her wound that is on the left great toe and second toe location. The toenail of the second toe covers over [name] portion of the wound which I am concerned may be hiding [name] piece of exposed bone based on what I see today. The patient does have [name] history of peripheral vascular disease he is [name] patient of Dr. Gery Pray who did an abdominal aortogram previous and was unable to intervene with regard to the lower extremity blood flow. The patient had [name] right ABI which was noncompressible and [name] left ABI was 0.6 with TBI's of 0.26 unfortunately. The TBI on the right was 0.62 which was not nearly as bad. Nonetheless the unfortunate thing is that with poor blood flow and potential for bone exposure this becomes increasingly complicated if the patient does have osteomyelitis to get this to heal. He does have repeat vascular testing on Monday upcoming. The patient is stated to be "prediabetic" according to notes and the patient. Currently he has been on Keflex. Patient has [name] history of peripheral vascular disease, prediabetes, hypertension, and unfortunately the current wound which has been present for 4 weeks based on what they tell me today. 01-08-2022 upon evaluation today patient appears to be doing about the same in regard to his wound. Again the x-ray was negative that was performed on 17th everything looks to be okay. With that  being said he is continuing to have issues here with poor blood flow and perfusion he did have [name] repeat arterial study and ABI with TBI TBI could not even be obtained because the signals were so dampened. Previously he had been at 0.2. In regard to the ABI was previously around 0.65 he was now around 0.45. Again this was definitely [name] decrease on the left side compared to previous the right was stable. He is stated to have critical limb ischemia. 01-15-2022 upon evaluation today patient appears to be doing well currently in regard to his wounds all things considered. Were seeing more growth on the big toe versus the second toe but this is to be expected at the second toe does have some bone exposed. Fortunately I do not see any evidence of active infection at this time. He does have an appointment scheduled for September 11 which is Monday for an arteriogram and then subsequently the 12th for bypass surgery. Subsequently I think this is can be very good for him as far as trying to get this healed I believe it is good to be the best plan that we have as far as getting good blood flow to allow for healing to continue. Once we get good blood flow I think we can then clean away some of the necrotic bone if necessary to try and allow this to heal more effectively. We also discussed briefly hyperbaric oxygen therapy and the possibility of distal toe amputation if need be but again right now we are still on the plantar stages of even knowing what we can do from [name] wound care perspective which I  think is the first initial goal here. 01-22-2022 upon evaluation today patient appears to be doing better currently in regard to his wounds all things considered. He actually is good to be having his vascular procedures upcoming next week. Nonetheless I think once we get better blood flow will be able to see where things stand. He is also going to have his MRI on Sunday which I think will also be very beneficial for [place] to  know exactly what is going on with the second toe in particular and the way we will get [name] proceed following. 02-12-2022 upon evaluation today patient appears to be doing well currently in regard to his wounds in fact he is showing signs of significant improvement which is great news. Fortunately I see no evidence of active infection locally or systemically at this time which is great news he has had his bypass on the left lower extremity and this does seem to be doing much better compared to what we were previous. He is in fact about twice [name] small on the great toe as it was last time I saw him I am very pleased with where things stand with the second toe as well though there is still bone exposed there seems to be excellent blood flow which I think in turn is can allow this to actually heal that is what we want. 02-19-2022 upon evaluation today patient appears to be doing excellent in regard to his wounds. He is going require some sharp debridement but to be honest I think he should really do quite well with this. He did have [name] lot more bleeding from the second toe after I did clean away some of the necrotic bone last week this week I think it should bleed nearly as much which should help and even more. 02-26-2022 patient appears to be doing well currently in regard to his wounds in fact the second toe looks like it could potentially even be healed although we will get [name] monitor 1 more week. The great toe is significantly smaller and is healing in an excellent fashion and very pleased in that regard. 10/18; the patient's second toe is healed as far as I can tell. The great toe has 2 open areas both of which look healthy and are measuring smaller. Primary dressing on the left first toe is collagen He has 5 days worth of Levaquin apparently has been on that for 2 months. I cannot see any reason to continue this at this point.He is 4 weeks status post his revascularization 03-12-2022 upon evaluation today  patient appears to be doing excellent in regard to his wound in fact this is significantly smaller compared to previous. I am extremely pleased and I do believe that we are on the right track here. I am going to go ahead and perform some debridement today just to clearway some of the necrotic debris on the surface of the wound. He is done with the Levaquin. 03-19-2022 upon evaluation today patient appears to be doing well currently in regard to his wound on the great toe. Fortunately there does not appear to be any signs of infection which is great news and overall I am extremely pleased with where we stand today. I think that he is very close to complete resolution there is some need for sharp debridement today. 11/8; the patient is fully epithelialized today. There is not appear to be any signs of infection. He has been using Loss adjuster, chartered) Signed: 03/26/2022 3:30:35 PM  By: Baltazar Najjar MD Entered By: Baltazar Najjar on 03/26/2022 09:14:43 [place], [place] ([id number]) [id number] Page 2 of 5 -------------------------------------------------------------------------------- Physical Exam Details Patient Name: Date of Service: [name] LFO RD 03/26/2022 9:00 [name] M Medical Record Number: [id number] Patient Account Number: [id number] Date of Birth/Sex: Treating RN: 08-03-1940 (81 y.o. M) Primary Care Provider: [name] Other Clinician: Referring Provider: Treating Provider/Extender: Edwyna Ready in Treatment: 12 Constitutional Sitting or standing Blood Pressure is within target range for patient.. Pulse regular and within target range for patient.Marland Kitchen Respirations regular, non-labored and within target range.. Temperature is normal and within the target range for the patient.Marland Kitchen Appears in no distress. Notes Wound exam; the patient's wound is totally epithelialized although still looking somewhat vulnerable. There is no  evidence of surrounding infection. The areas on the medial part of the left great toe over the interphalangeal joint Electronic Signature(s) Signed: 03/26/2022 3:30:35 PM By: Baltazar Najjar MD Entered By: Baltazar Najjar on 03/26/2022 09:15:27 -------------------------------------------------------------------------------- Physician Orders Details Patient Name: Date of Service: [name], [name] LFO RD 03/26/2022 9:00 [name] M Medical Record Number: [id number] Patient Account Number: [id number] Date of Birth/Sex: Treating RN: 03-Feb-1941 (81 y.o. [name], [name] Primary Care Provider: [name] Other Clinician: Referring Provider: Treating Provider/Extender: Edwyna Ready in Treatment: 12 Verbal / Phone Orders: No Diagnosis Coding Discharge From [facility] Services Discharge from Wound Care Center Non Wound Condition Protect area with: - gauze and foam Electronic Signature(s) Signed: 03/26/2022 3:30:35 PM By: Baltazar Najjar MD Signed: 03/26/2022 3:55:23 PM By: [name] RN Entered By: [name] on 03/26/2022 09:08:55 -------------------------------------------------------------------------------- Problem List Details Patient Name: Date of Service: [name], [name] LFO RD 03/26/2022 9:00 [name] M Medical Record Number: [id number] Patient Account Number: [id number] Date of Birth/Sex: Treating RN: Apr 23, 1941 (81 y.o. Geri [place], [place] ([id number]) [id number] Page 3 of 5 Primary Care Provider: [name] Other Clinician: Referring Provider: Treating Provider/Extender: Edwyna Ready in Treatment: 12 Active Problems ICD-10 Encounter Code Description Active Date MDM Diagnosis I73.89 Other specified peripheral vascular diseases 01/01/2022 No Yes L97.522 Non-pressure chronic ulcer of other part of left foot with fat layer exposed 01/01/2022 No Yes L97.524 Non-pressure chronic ulcer of other part of left foot with  necrosis of bone 02/12/2022 No Yes R73.03 Prediabetes 01/01/2022 No Yes I10 Essential (primary) hypertension 01/01/2022 No Yes Inactive Problems Resolved Problems Electronic Signature(s) Signed: 03/26/2022 3:30:35 PM By: Baltazar Najjar MD Entered By: Baltazar Najjar on 03/26/2022 09:13:59 -------------------------------------------------------------------------------- Progress Note Details Patient Name: Date of Service: [name], [name] LFO RD 03/26/2022 9:00 [name] M Medical Record Number: [id number] Patient Account Number: [id number] Date of Birth/Sex: Treating RN: 06/14/1940 (81 y.o. M) Primary Care Provider: [name] Other Clinician: Referring Provider: Treating Provider/Extender: Edwyna Ready in Treatment: 12 Subjective History of Present Illness (HPI) 01-01-2022 upon evaluation today patient appears to be doing poorly in regard to her wound that is on the left great toe and second toe location. The toenail of the second toe covers over [name] portion of the wound which I am concerned may be hiding [name] piece of exposed bone based on what I see today. The patient does have [name] history of peripheral vascular disease he is [name] patient of Dr. Gery Pray who did an abdominal aortogram previous and was unable to intervene with regard to the lower extremity blood flow. The patient had [name] right ABI which was noncompressible and [name] left ABI was 0.6 with TBI's of  0.26 unfortunately. The TBI on the right was 0.62 which was not nearly as bad. Nonetheless the unfortunate thing is that with poor blood flow and potential for bone exposure this becomes increasingly complicated if the patient does have osteomyelitis to get this to heal. He does have repeat vascular testing on Monday upcoming. The patient is stated to be "prediabetic" according to notes and the patient. Currently he has been on Keflex. Patient has [name] history of peripheral vascular disease, prediabetes, hypertension, and unfortunately the  current wound which has been present for 4 weeks based on what they tell me today. 01-08-2022 upon evaluation today patient appears to be doing about the same in regard to his wound. Again the x-ray was negative that was performed on 17th everything looks to be okay. With that being said he is continuing to have issues here with poor blood flow and perfusion he did have [name] repeat arterial study and ABI with TBI TBI could not even be obtained because the signals were so dampened. Previously he had been at 0.2. In regard to the ABI was previously around 0.65 he was now around 0.45. Again this was definitely [name] decrease on the left side compared to previous the right was stable. He is stated to have critical limb ischemia. 01-15-2022 upon evaluation today patient appears to be doing well currently in regard to his wounds all things considered. Were seeing more growth on the big toe versus the second toe but this is to be expected at the second toe does have some bone exposed. Fortunately I do not see any evidence of active [place], [place] ([id number]) [id number] Page 4 of 5 infection at this time. He does have an appointment scheduled for September 11 which is Monday for an arteriogram and then subsequently the 12th for bypass surgery. Subsequently I think this is can be very good for him as far as trying to get this healed I believe it is good to be the best plan that we have as far as getting good blood flow to allow for healing to continue. Once we get good blood flow I think we can then clean away some of the necrotic bone if necessary to try and allow this to heal more effectively. We also discussed briefly hyperbaric oxygen therapy and the possibility of distal toe amputation if need be but again right now we are still on the plantar stages of even knowing what we can do from [name] wound care perspective which I think is the first initial goal here. 01-22-2022 upon evaluation today  patient appears to be doing better currently in regard to his wounds all things considered. He actually is good to be having his vascular procedures upcoming next week. Nonetheless I think once we get better blood flow will be able to see where things stand. He is also going to have his MRI on Sunday which I think will also be very beneficial for [place] to know exactly what is going on with the second toe in particular and the way we will get [name] proceed following. 02-12-2022 upon evaluation today patient appears to be doing well currently in regard to his wounds in fact he is showing signs of significant improvement which is great news. Fortunately I see no evidence of active infection locally or systemically at this time which is great news he has had his bypass on the left lower extremity and this does seem to be doing much better compared to what we were previous. He is in  fact about twice [name] small on the great toe as it was last time I saw him I am very pleased with where things stand with the second toe as well though there is still bone exposed there seems to be excellent blood flow which I think in turn is can allow this to actually heal that is what we want. 02-19-2022 upon evaluation today patient appears to be doing excellent in regard to his wounds. He is going require some sharp debridement but to be honest I think he should really do quite well with this. He did have [name] lot more bleeding from the second toe after I did clean away some of the necrotic bone last week this week I think it should bleed nearly as much which should help and even more. 02-26-2022 patient appears to be doing well currently in regard to his wounds in fact the second toe looks like it could potentially even be healed although we will get [name] monitor 1 more week. The great toe is significantly smaller and is healing in an excellent fashion and very pleased in that regard. 10/18; the patient's second toe is healed as far as I  can tell. The great toe has 2 open areas both of which look healthy and are measuring smaller. Primary dressing on the left first toe is collagen He has 5 days worth of Levaquin apparently has been on that for 2 months. I cannot see any reason to continue this at this point.He is 4 weeks status post his revascularization 03-12-2022 upon evaluation today patient appears to be doing excellent in regard to his wound in fact this is significantly smaller compared to previous. I am extremely pleased and I do believe that we are on the right track here. I am going to go ahead and perform some debridement today just to clearway some of the necrotic debris on the surface of the wound. He is done with the Levaquin. 03-19-2022 upon evaluation today patient appears to be doing well currently in regard to his wound on the great toe. Fortunately there does not appear to be any signs of infection which is great news and overall I am extremely pleased with where we stand today. I think that he is very close to complete resolution there is some need for sharp debridement today. 11/8; the patient is fully epithelialized today. There is not appear to be any signs of infection. He has been using [employer]ero[employer] Objective Constitutional Sitting or standing Blood Pressure is within target range for patient.. Pulse regular and within target range for patient.Marland Kitchen Respirations regular, non-labored and within target range.. Temperature is normal and within the target range for the patient.Marland Kitchen Appears in no distress. Vitals Time Taken: 8:44 AM, Temperature: 97.6 F, Pulse: 58 bpm, Respiratory Rate: 18 breaths/min, Blood Pressure: 130/69 mmHg. General Notes: Wound exam; the patient's wound is totally epithelialized although still looking somewhat vulnerable. There is no evidence of surrounding infection. The areas on the medial part of the left great toe over the interphalangeal joint Integumentary (Hair, Skin) Wound #1 status  is Healed - Epithelialized. Original cause of wound was Trauma. The date acquired was: 12/01/2021. The wound has been in treatment 12 weeks. The wound is located on the Left T Great. The wound measures 0cm length x 0cm width x 0cm depth; 0cm^2 area and 0cm^3 volume. There is Fat oe Layer (Subcutaneous Tissue) exposed. There is no tunneling or undermining noted. There is [name] medium amount of serosanguineous drainage noted. The wound  margin is distinct with the outline attached to the wound base. There is large (67-100%) red, pink granulation within the wound bed. There is no necrotic tissue within the wound bed. The periwound skin appearance had no abnormalities noted for color. The periwound skin appearance exhibited: Callus. The periwound skin appearance did not exhibit: Crepitus, Excoriation, Maceration. Periwound temperature was noted as No Abnormality. 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 I have asked him to keep [name] foam cover on this and gauze. I have also asked him to buy insoles for his shoes and work boots. 3. I think he can be discharged from the clinic. We will remain available if this opens in the short-term. He is at risk of this if this is not adequately Automotive engineer) Signed: 03/26/2022 3:30:35 PM By: Linton Ham MD Entered By: Linton Ham on 03/26/2022  09:20:22 -------------------------------------------------------------------------------- SuperBill Details Patient Name: Date of Service: [name], [name] LFO RD 03/26/2022 Medical Record Number: [id number] Patient Account Number: [id number] Date of Birth/Sex: Treating RN: 08-31-1940 (81 y.o. 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

## 2022-03-26 NOTE — Progress Notes (Signed)
[place], [place] ([id number]) [id number] Page 1 of 8 Visit Report for 03/26/2022 Arrival Information Details Patient Name: Date of Service: [name] LFO RD 03/26/2022 9:00 A M Medical Record Number: [id number] Patient Account Number: [id number] Date of Birth/Sex: Treating RN: Jan 02, 1941 (81 y.o. M) Primary Care Sobia Karger: [name] Other Clinician: Referring Eliott Amparan: Treating Chevis Weisensel/Extender: Edwyna Ready in Treatment: 12 Visit Information History Since Last Visit Added or deleted any medications: No Patient Arrived: Ambulatory Any new allergies or adverse reactions: No Arrival Time: 08:43 Had a fall or experienced change in No Accompanied By: wife activities of daily living that may affect Transfer Assistance: None risk of falls: Patient Identification Verified: Yes Signs or symptoms of abuse/neglect since last visito No Secondary Verification Process Completed: Yes Hospitalized since last visit: No Patient Requires Transmission-Based Precautions: No Implantable device outside of the clinic excluding No Patient Has Alerts: Yes cellular tissue based products placed in the center Patient Alerts: ABI's:02/23 R: N/C L:0.6 since last visit: TBI's:02/23 R:0.62L:0.26 Has Dressing in Place as Prescribed: Yes Pain Present Now: No Electronic Signature(s) Signed: 03/26/2022 3:44:50 PM By: Thayer Dallas Entered By: Thayer Dallas on 03/26/2022 08:44:54 -------------------------------------------------------------------------------- Clinic Level of Care Assessment Details Patient Name: Date of Service: [name] LFO RD 03/26/2022 9:00 A M Medical Record Number: [id number] Patient Account Number: [id number] Date of Birth/Sex: Treating RN: [birth year] (81 y.o. [name] Primary Care Monica Zahler: [name] Other Clinician: Referring Anik Wesch: Treating Tyrique Sporn/Extender: Edwyna Ready in  Treatment: 12 Clinic Level of Care Assessment Items TOOL 4 Quantity Score X- 1 0 Use when only an EandM is performed on FOLLOW-UP visit ASSESSMENTS - Nursing Assessment / Reassessment X- 1 10 Reassessment of Co-morbidities (includes updates in patient status) X- 1 5 Reassessment of Adherence to Treatment Plan ASSESSMENTS - Wound and Skin A ssessment / Reassessment X - Simple Wound Assessment / Reassessment - one wound 1 5 []  - 0 Complex Wound Assessment / Reassessment - multiple wounds []  - 0 Dermatologic / Skin Assessment (not related to wound area) ASSESSMENTS - Focused Assessment X- 1 5 Circumferential Edema Measurements - multi extremities []  - 0 Nutritional Assessment / Counseling / Intervention [place], ( [place].pdf Page 2 of 8 []  - 0 Lower Extremity Assessment (monofilament, tuning fork, pulses) []  - 0 Peripheral Arterial Disease Assessment (using hand held doppler) ASSESSMENTS - Ostomy and/or Continence Assessment and Care []  - 0 Incontinence Assessment and Management []  - 0 Ostomy Care Assessment and Management (repouching, etc.) PROCESS - Coordination of Care X - Simple Patient / Family Education for ongoing care 1 15 []  - 0 Complex (extensive) Patient / Family Education for ongoing care X- 1 10 Staff obtains [name], [name], [name] Results / Process Orders est []  - 0 Staff telephones HHA, Nursing Homes / Clarify orders / etc []  - 0 Routine Transfer to another Facility (non-emergent condition) []  - 0 Routine Hospital Admission (non-emergent condition) []  - 0 New Admissions / [id number] / Ordering NPWT Apligraf, etc. , []  - 0 Emergency Hospital Admission (emergent condition) X- 1 10 Simple Discharge Coordination []  - 0 Complex (extensive) Discharge Coordination PROCESS - Special Needs []  - 0 Pediatric / Minor Patient Management []  - 0 Isolation Patient Management []  - 0 Hearing / Language / Visual  special needs []  - 0 Assessment of Community assistance (transportation, D/C planning, etc.) []  - 0 Additional assistance / Altered mentation []  - 0 Support Surface(s) Assessment (bed, cushion, seat, etc.) INTERVENTIONS - Wound  Cleansing / Measurement X - Simple Wound Cleansing - one wound 1 5 []  - 0 Complex Wound Cleansing - multiple wounds X- 1 5 Wound Imaging (photographs - any number of wounds) []  - 0 Wound Tracing (instead of photographs) X- 1 5 Simple Wound Measurement - one wound []  - 0 Complex Wound Measurement - multiple wounds INTERVENTIONS - Wound Dressings X - Small Wound Dressing one or multiple wounds 1 10 []  - 0 Medium Wound Dressing one or multiple wounds []  - 0 Large Wound Dressing one or multiple wounds X- 1 5 Application of Medications - topical []  - 0 Application of Medications - injection INTERVENTIONS - Miscellaneous []  - 0 External ear exam []  - 0 Specimen Collection (cultures, biopsies, blood, body fluids, etc.) []  - 0 Specimen(s) / Culture(s) sent or taken to Lab for analysis []  - 0 Patient Transfer (multiple staff / / Similar devices) []  - 0 Simple Staple / Suture removal (25 or less) []  - 0 Complex Staple / Suture removal (26 or more) []  - 0 Hypo / Hyperglycemic Management (close monitor of Blood Glucose) [name], [name] (  .pdf Page 3 of 8 []  - 0 Ankle / Brachial Index (ABI) - do not check if billed separately X- 1 5 Vital Signs Has the patient been seen at the hospital within the last three years: Yes Total Score: 95 Level Of Care: New/Established - Level 3 Electronic Signature(s) Signed: 03/26/2022 3:55:23 PM By: RN Entered By: on 03/26/2022 09:13:57 -------------------------------------------------------------------------------- Encounter Discharge Information Details Patient Name: Date of Service: , A LFO RD 03/26/2022 9:00 A M Medical  Record Number: Patient Account Number: Nurse, adult Date of Birth/Sex: Treating RN: 03-29-1941 (81 y.o. , [name] Primary Care Ezmeralda Stefanick: [id number] Other Clinician: Referring Charlyne Robertshaw: Treating Eryca Bolte/Extender: ) [id number] in Treatment: 12 Encounter Discharge Information Items Discharge Condition: Stable Ambulatory Status: Ambulatory Discharge Destination: Home Transportation: Private Auto Accompanied By: wife Schedule Follow-up Appointment: Yes Clinical Summary of Care: Patient Declined Electronic Signature(s) Signed: 03/26/2022 3:55:23 PM By: 13/12/2021 RN Entered By: [name] on 03/26/2022 09:10:22 -------------------------------------------------------------------------------- Lower Extremity Assessment Details Patient Name: Date of Service: 13/12/2021, A LFO RD 03/26/2022 9:00 A M Medical Record Number: 13/12/2021 Patient Account Number: [id number] Date of Birth/Sex: Treating RN: 09-27-1940 (81 y.o. M) Primary Care Rashell Shambaugh: 77 Other Clinician: Referring Shea Swalley: Treating Libi Corso/Extender: [name] in Treatment: 12 Edema Assessment Assessed: [name]: No] Edwyna Ready: No] Edema: [Left: Ye] [Right: s] Calf Left: Right: Point of Measurement: 37 cm From Medial Instep 40.6 cm Ankle Left: Right: Point of Measurement: 10 cm From Medial Instep 26 cm Vascular Assessment [name], [name] ([name]) [Right:[id number] Page 4 of 8] Pulses: Dorsalis Pedis Palpable: [Left:Yes] Electronic Signature(s) Signed: 03/26/2022 3:44:50 PM By: 13/12/2021 Entered By: [name] on 03/26/2022 08:53:38 -------------------------------------------------------------------------------- Multi Wound Chart Details Patient Name: Date of Service: [id number], A LFO RD 03/26/2022 9:00 A M Medical Record Number: 04/11/1941 Patient Account Number: [id number] Date of Birth/Sex:  Treating RN: [birth year] (81 y.o. M) Primary Care Eeshan Verbrugge: Kyra Searles Other Clinician: Referring Darria Corvera: Treating Ranjit Ashurst/Extender: Franne Forts in Treatment: 12 Vital Signs Height(in): Pulse(bpm): 58 Weight(lbs): Blood Pressure(mmHg): 130/69 Body Mass Index(BMI): Temperature(F): 97.6 Respiratory Rate(breaths/min): 18 [1:Photos:] [N/A:N/A] Left [name] Great oe N/A N/A Wound Location: Trauma N/A N/A Wounding Event: Arterial Insufficiency Ulcer N/A N/A Primary Etiology: Coronary Artery Disease, N/A N/A Comorbid History: Hypotension, Peripheral Arterial Disease, Peripheral Venous Disease, Type  II Diabetes, Osteoarthritis 12/01/2021 N/A N/A Date Acquired: 12 N/A N/A Weeks of Treatment: Healed - Epithelialized N/A N/A Wound Status: No N/A N/A Wound Recurrence: 0x0x0 N/A N/A Measurements L x W x D (cm) 0 N/A N/A A (cm) : rea 0 N/A N/A Volume (cm) : 100.00% N/A N/A % Reduction in Area: 100.00% N/A N/A % Reduction in Volume: Full Thickness With Exposed Support N/A N/A Classification: Structures Medium N/A N/A Exudate Amount: Serosanguineous N/A N/A Exudate Type: red, brown N/A N/A Exudate Color: Distinct, outline attached N/A N/A Wound Margin: Large (67-100%) N/A N/A Granulation Amount: Red, Pink N/A N/A Granulation Quality: None Present (0%) N/A N/A Necrotic Amount: Fat Layer (Subcutaneous Tissue): Yes N/A N/A Exposed Structures: Fascia: No Tendon: No Muscle: No Joint: No Bone: No Medium (34-66%) N/A N/A Epithelialization: Callus: Yes N/A N/A Periwound Skin Texture: Excoriation: No Crepitus: No Sturgeon, Manolito ([id number]) [id number] Page 5 of 8 Maceration: No N/A N/A Periwound Skin Moisture: No Abnormalities Noted N/A N/A Periwound Skin Color: No Abnormality N/A N/A Temperature: Treatment Notes Wound #1 (Toe Great) Wound Laterality: Left Cleanser Peri-Wound Care Topical Primary  Dressing Secondary Dressing Secured With Compression Wrap Compression Stockings Add-Ons Electronic Signature(s) Signed: 03/26/2022 3:30:35 PM By: Baltazar Najjar MD Entered By: Baltazar Najjar on 03/26/2022 09:14:05 -------------------------------------------------------------------------------- Multi-Disciplinary Care Plan Details Patient Name: Date of Service: [name], A LFO RD 03/26/2022 9:00 A M Medical Record Number: [id number] Patient Account Number: [id number] Date of Birth/Sex: Treating RN: 01/11/1941 (81 y.o. [name] Primary Care Catarino Vold: [name] Other Clinician: Referring Devone Tousley: Treating Shalee Paolo/Extender: Edwyna Ready in Treatment: 12 Active Inactive Electronic Signature(s) Signed: 03/26/2022 3:55:23 PM By: [name] RN Entered By: [name] on 03/26/2022 09:09:11 -------------------------------------------------------------------------------- Pain Assessment Details Patient Name: Date of Service: [name], A LFO RD 03/26/2022 9:00 A M Medical Record Number: [id number] Patient Account Number: [id number] Date of Birth/Sex: Treating RN: 10-09-1940 (81 y.o. M) Primary Care [place] Nick: [name] Other Clinician: Referring Shota Kohrs: Treating Shahir Karen/Extender: Edwyna Ready in Treatment: 12 Active Problems Location of Pain Severity and Description of Pain Patient Has Paino No [place], [place] ([id number]) [id number] Page 6 of 8 Patient Has Paino No Site Locations Pain Management and Medication Current Pain Management: Electronic Signature(s) Signed: 03/26/2022 3:44:50 PM By: Thayer Dallas Entered By: Thayer Dallas on 03/26/2022 08:53:49 -------------------------------------------------------------------------------- Patient/Caregiver Education Details Patient Name: Date of Service: [name], A LFO RD 11/8/2023andnbsp9:00 A M Medical Record Number:  [id number] Patient Account Number: [id number] Date of Birth/Gender: Treating RN: May 29, 1940 (81 y.o. [name] Primary Care Physician: [name] Other Clinician: Referring Physician: Treating Physician/Extender: Edwyna Ready in Treatment: 12 Education Assessment Education Provided To: Patient Education Topics Provided Tissue Oxygenation: Methods: Explain/Verbal Responses: State content correctly Electronic Signature(s) Signed: 03/26/2022 3:55:23 PM By: [name] RN Entered By: [name] on 03/26/2022 08:59:20 -------------------------------------------------------------------------------- Wound Assessment Details Patient Name: Date of Service: [name], A LFO RD 03/26/2022 9:00 A M Medical Record Number: [id number] Patient Account Number: [id number] Date of Birth/Sex: Treating RN: 1940/10/02 (81 y.o. [name] [place], Ivar ([id number]) [id number] Page 7 of 8 Primary Care Milton Sagona: [name] Other Clinician: Referring Gustav Knueppel: Treating [name]/Extender: Edwyna Ready in Treatment: 12 Wound Status Wound Number: 1 Primary Arterial Insufficiency Ulcer Etiology: Wound Location: Left [name] Great oe Wound Healed - Epithelialized Wounding Event: Trauma Status: Date Acquired: 12/01/2021 Comorbid Coronary Artery Disease, Hypotension, Peripheral Arterial Disease, Weeks Of Treatment: 12 History: Peripheral Venous  Disease, Type II Diabetes, Osteoarthritis Clustered Wound: No Photos Wound Measurements Length: (cm) Width: (cm) Depth: (cm) Area: (cm) Volume: (cm) 0 % Reduction in Area: 100% 0 % Reduction in Volume: 100% 0 Epithelialization: Medium (34-66%) 0 Tunneling: No 0 Undermining: No Wound Description Classification: Full Thickness With Exposed Suppo Wound Margin: Distinct, outline attached Exudate Amount: Medium Exudate Type: Serosanguineous Exudate  Color: red, brown rt Structures Foul Odor After Cleansing: No Slough/Fibrino Yes Wound Bed Granulation Amount: Large (67-100%) Exposed Structure Granulation Quality: Red, Pink Fascia Exposed: No Necrotic Amount: None Present (0%) Fat Layer (Subcutaneous Tissue) Exposed: Yes Tendon Exposed: No Muscle Exposed: No Joint Exposed: No Bone Exposed: No Periwound Skin Texture Texture Color No Abnormalities Noted: No No Abnormalities Noted: Yes Callus: Yes Temperature / Pain Crepitus: No Temperature: No Abnormality Excoriation: No Moisture No Abnormalities Noted: No Maceration: No Treatment Notes Wound #1 (Toe Great) Wound Laterality: Left Cleanser Peri-Wound Care Topical Primary Dressing Secondary Dressing Secured With [place], [place] ([id number]) [id number] Page 8 of 8 Compression Wrap Compression Stockings Add-Ons Electronic Signature(s) Signed: 03/26/2022 3:55:23 PM By: [name] RN Entered By: [name] on 03/26/2022 09:08:06 -------------------------------------------------------------------------------- Vitals Details Patient Name: Date of Service: [name], A LFO RD 03/26/2022 9:00 A M Medical Record Number: [id number] Patient Account Number: [id number] Date of Birth/Sex: Treating RN: 09-Jan-1941 (81 y.o. M) Primary Care Danely Bayliss: [name] Other Clinician: Referring Merla Sawka: Treating Mailani Degroote/Extender: Edwyna Ready in Treatment: 12 Vital Signs Time Taken: 08:44 Temperature (F): 97.6 Pulse (bpm): 58 Respiratory Rate (breaths/min): 18 Blood Pressure (mmHg): 130/69 Reference Range: 80 - 120 mg / dl Electronic Signature(s) Signed: 03/26/2022 3:44:50 PM By: Thayer Dallas Entered By: Thayer Dallas on 03/26/2022 08:45:25

## 2022-04-02 ENCOUNTER — Encounter (HOSPITAL_BASED_OUTPATIENT_CLINIC_OR_DEPARTMENT_OTHER): Payer: Medicare HMO | Admitting: Physician Assistant

## 2022-04-04 ENCOUNTER — Encounter: Payer: Self-pay | Admitting: Cardiovascular Disease

## 2022-04-04 ENCOUNTER — Ambulatory Visit: Payer: Medicare HMO | Attending: Cardiovascular Disease | Admitting: Cardiovascular Disease

## 2022-04-04 VITALS — BP 130/60 | HR 65 | Ht 73.0 in | Wt 219.4 lb

## 2022-04-04 DIAGNOSIS — Z951 Presence of aortocoronary bypass graft: Secondary | ICD-10-CM

## 2022-04-04 DIAGNOSIS — I739 Peripheral vascular disease, unspecified: Secondary | ICD-10-CM

## 2022-04-04 DIAGNOSIS — I10 Essential (primary) hypertension: Secondary | ICD-10-CM | POA: Diagnosis not present

## 2022-04-04 DIAGNOSIS — E785 Hyperlipidemia, unspecified: Secondary | ICD-10-CM | POA: Diagnosis not present

## 2022-04-04 MED ORDER — ISOSORBIDE MONONITRATE ER 60 MG PO TB24: 90.0000 mg | ORAL_TABLET | Freq: Every day | ORAL | 3 refills | Status: DC

## 2022-04-04 NOTE — Assessment & Plan Note (Signed)
History of dyslipidemia on Praluent with lipid profile performed 02/05/2022 revealing total cholesterol 71, LDL 20 and HDL 34.

## 2022-04-04 NOTE — Assessment & Plan Note (Signed)
History of PAD with critical ischemia.  He has known occluded SFAs bilaterally.  I did perform orbital atherectomy, PTA and stenting of a calcified distal left common iliac artery 12/06/2020.  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  Because of critical ischemia I referred him to Dr. Randie Heinz who performed endarterectomy of the left popliteal and tibial peroneal trunk with vein angioplasty and left below the knee popliteal artery bypass grafting with a 6 mm PTF E graft.  His wound on his left foot has ultimately healed and he was discharged from the wound care center last week.  Dr. Jodie Echevaria is following his Doppler studies.

## 2022-04-04 NOTE — Assessment & Plan Note (Signed)
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  He had a LIMA to his LAD, vein to marginal branch as well as the L PDA.  I performed cardiac catheterization on him because of chest pain 06/28/2020 revealing patent grafts with an apical LAD lesion which I decided to treat medically.  He did have a Myoview stress test performed 02/05/2022 in the distal LAD distribution.  He was begun on Imdur and this has improved his chest pain but he still gets chest pain at night before going to bed.  I am going to increase his Imdur from 60 to 90 mg a day.

## 2022-04-04 NOTE — Patient Instructions (Signed)
Medication Instructions:  Your physician has recommended you make the following change in your medication:   -Increase isosorbide mononitrate (Imdur) to 90mg  once daily.   *If you need a refill on your cardiac medications before your next appointment, please call your pharmacy*   Follow-Up: At [facility], you and your health needs are our priority.  As part of our continuing mission to provide you with exceptional heart care, we have created designated Provider Care Teams.  These Care Teams include your primary Cardiologist (physician) and Advanced Practice Providers (APPs -  Physician Assistants and Nurse Practitioners) who all work together to provide you with the care you need, when you need it.  We recommend signing up for the patient portal called "MyChart".  Sign up information is provided on this After Visit Summary.  MyChart is used to connect with patients for Virtual Visits (Telemedicine).  Patients are able to view lab/test results, encounter notes, upcoming appointments, etc.  Non-urgent messages can be sent to your provider as well.   To learn more about what you can do with MyChart, go to [facility].    Your next appointment:   3 month(s)  The format for your next appointment:   In Person  Provider:   [email or web address], PA-C       Then, Azalee Course, MD will plan to see you again in 12 month(s).

## 2022-04-04 NOTE — Progress Notes (Signed)
04/04/2022 [name]   [birth year]  [id number]  Primary Physician Saguier, Kateri Mc Primary Cardiologist: [name] MD FACP, [place], [place], [place]  HPI:  [name] is a 81 y.o.  mildly overweight married Caucasian male whoI last saw him in the office 01/14/2022.Marland Kitchen  He is accompanied by his wife [name] today.  He is the father of one child, grandfather to  one grandchild. I last saw him in the office 06/26/2020. 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 [name] December 27, 2004, for left main 3-vessel disease. He had a LIMA to his LAD, a vein to his circumflex marginal branch as well as to the PDA. His other problems include hypertension and hyperlipidemia. He denies chest pain or shortness of breath. He does have left ABI in the 0.85range with a high-frequency signal in his left SFA by duplex ultrasound 10/20/12 but did not claudication at that time. His last Myoview performed 06/27/11 was nonischemic.4 years ago he developed lifestyle limiting bilateral lower extremity claudication. Should be noted that he did stop smoking at the time of his bypass surgery in 2006. He underwent peripheral angiography by myself 08/04/16 revealing a long segment calcified occlusion right SFA and high-grade diffuse calcified left SFA stenosis. I did not think that he could be percutaneously revascularized on the right, and therefore opted for medical therapy. This Pletal has been titrated to 100 mg by mouth twice a day without significant improvement in his claudication symptoms. These are lifestyle limiting. He denies chest pain or shortness of breath.   I performed peripheral angiography and endovascular therapy of his diffusely calcified and highly obstructive left SFA using diamondback orbital rotational atherectomy followed by drug-eluting balloon plasty. This left ABI increased from 0.63-.85 and his claudication has significantly improved and were resolved.    He  had  progressive upper extremity effort angina.  He did have a negative Myoview 07/14/2019.  He saw Corine Shelter, PA-C in the office 04/27/2020.  His Imdur was increased but this is not affected the frequency and/or severity of his symptoms.  He also complains of claudication bilaterally.  He has a known occluded right SFA from prior angiography status post orbital atherectomy, PTA of his subtotally occluded calcified left SFA 04/13/2017.  Recent Dopplers performed 04/26/2020 revealed a left ABI of 0.52 with an occluded left popliteal artery.   I performed right left heart cath on him 06/28/2020 revealing a left dominant system, normal LV function, patent vein to the distal left PDA, LM branch and LIMA to the LAD.  He did have a 90% apical LAD stenosis after LIMA insertion.  His hemodynamics are stable.  He says he feels better since the heart cath although I did not do an intervention.  I also performed abdominal aortography and bilateral iliac angiography revealing no obstructive disease.  He does complain of bilateral calf claudication.  The etiology of his chest pain cannot be explained by the findings on his heart cath I have reassured him and his wife [name].   I performed peripheral angiography on him 12/06/2020 revealing occluded SFAs bilaterally with three-vessel runoff.  He had a 90% calcified exophytic distal left common iliac artery stenosis.  I performed orbital atherectomy, PTA and covered stenting with an 8 mm x 29 mm long VBX covered stent postdilated with a 10 mm x 2 cm balloon.  His follow-up Doppler studies revealed unchanged ABIs although his velocities improved.  His claudication improved as  well although given his SFAs bilaterally.  He still has some lifestyle limiting claudication.  Since I saw him in the office 3 months ago I did refer him to Dr. Donzetta Matters for critical limb ischemia.  He ultimately had left common femoral and tibioperoneal trunk endarterectomy and vein angioplasty with a 6 mm PTFE  bypass graft to the below the knee popliteal artery.  His wound on his left foot has ultimately healed and he was discharged from wound care center last week.  He did have a Myoview stress test performed 02/05/2022 that showed some anterior ischemia but I suspect this is related to his apical LAD lesion that I demonstrated at that time of cardiac catheterization 06/28/2020.  This is somewhat improved with long-acting oral nitrates which I will titrate.   Current Meds  Medication Sig   amLODipine (NORVASC) 10 MG tablet TAKE 1 TABLET BY MOUTH EVERY DAY (Patient taking differently: Take 10 mg by mouth daily.)   aspirin 81 MG tablet Take 81 mg by mouth daily.   Cholecalciferol (VITAMIN D) 50 MCG (2000 UT) tablet Take 2,000 Units by mouth daily.   clopidogrel (PLAVIX) 75 MG tablet TAKE 1 TABLET BY MOUTH EVERY DAY (Patient taking differently: Take 75 mg by mouth daily.)   Coenzyme Q10 (COQ10) 200 MG CAPS Take 200 mg by mouth daily.   famotidine (PEPCID) 20 MG tablet Take 1 tablet (20 mg total) by mouth daily.   fluticasone (FLONASE) 50 MCG/ACT nasal spray Place 2 sprays into both nostrils daily.   isosorbide mononitrate (IMDUR) 60 MG 24 hr tablet Take 1 tablet (60 mg total) by mouth daily.   levocetirizine (XYZAL) 5 MG tablet Take 1 tablet (5 mg total) by mouth every evening.   levofloxacin (LEVAQUIN) 750 MG tablet Take 750 mg by mouth daily. 30 day course.   meclizine (ANTIVERT) 12.5 MG tablet Take 1 tablet (12.5 mg total) by mouth 3 (three) times daily as needed for dizziness.   metFORMIN (GLUCOPHAGE) 500 MG tablet Take 1 tablet (500 mg total) by mouth 2 (two) times daily with a meal.   metoprolol succinate (TOPROL-XL) 50 MG 24 hr tablet Take 1 tablet (50 mg total) by mouth 2 (two) times daily. Take with or immediately following a meal.   nitroGLYCERIN (NITROSTAT) 0.4 MG SL tablet Place 1 tablet (0.4 mg total) under the tongue every 5 (five) minutes as needed for chest pain.   Omega-3 Fatty Acids (FISH  OIL) 1200 MG CAPS Take 2 capsules (2,400 mg total) by mouth every morning.   PRALUENT 150 MG/ML SOAJ INJECT 150 MG INTO THE SKIN EVERY 14 (FOURTEEN) DAYS.     Allergies  Allergen Reactions   Crestor [Rosuvastatin] Other (See Comments)    Myalgias   Lipitor [Atorvastatin] Other (See Comments)    Myalgias    Zestril [Lisinopril] Cough    Social History   Socioeconomic History   Marital status: Married    Spouse name: cathy   Number of children: 1   Years of education: 12   Highest education level: Not on file  Occupational History   Occupation: retired  Tobacco Use   Smoking status: Former    Packs/day: 1.00    Years: 50.00    Total pack years: 50.00    Types: Cigarettes    Quit date: 12/17/2004    Years since quitting: 17.3   Smokeless tobacco: Never  Vaping Use   Vaping Use: Never used  Substance and Sexual Activity   Alcohol use: Yes  Comment: occasionally   Drug use: No   Sexual activity: Not on file  Other Topics Concern   Not on file  Social History Narrative   Lives with wife   Caffeine - coffee, 1 cup daily   Social Determinants of Health   Financial Resource Strain: Not on file  Food Insecurity: No Food Insecurity (02/04/2022)   Hunger Vital Sign    Worried About Running Out of Food in the Last Year: Never true    Ran Out of Food in the Last Year: Never true  Transportation Needs: No Transportation Needs (02/04/2022)   PRAPARE - Hydrologist (Medical): No    Lack of Transportation (Non-Medical): No  Physical Activity: Not on file  Stress: Not on file  Social Connections: Not on file  Intimate Partner Violence: Not At Risk (02/05/2022)   Humiliation, Afraid, Rape, and Kick questionnaire    Fear of Current or Ex-Partner: No    Emotionally Abused: No    Physically Abused: No    Sexually Abused: No     Review of Systems: General: negative for chills, fever, night sweats or weight changes.  Cardiovascular: negative for  chest pain, dyspnea on exertion, edema, orthopnea, palpitations, paroxysmal nocturnal dyspnea or shortness of breath Dermatological: negative for rash Respiratory: negative for cough or wheezing Urologic: negative for hematuria Abdominal: negative for nausea, vomiting, diarrhea, bright red blood per rectum, melena, or hematemesis Neurologic: negative for visual changes, syncope, or dizziness All other systems reviewed and are otherwise negative except as noted above.    Blood pressure 130/60, pulse 65, height 6\' 1"  ([id number] m), weight 219 lb 6.4 oz (99.5 kg), SpO2 97 %.  General appearance: alert and no distress Neck: no adenopathy, no carotid bruit, no JVD, supple, symmetrical, trachea midline, and thyroid not enlarged, symmetric, no tenderness/mass/nodules Lungs: clear to auscultation bilaterally Heart: regular rate and rhythm, S1, S2 normal, no murmur, click, rub or gallop Extremities: extremities normal, atraumatic, no cyanosis or edema Pulses: Diminished pedal pulses Skin: Skin color, texture, turgor normal. No rashes or lesions Neurologic: Grossly normal  EKG sinus rhythm at 65 without ST or T wave changes.  Personally reviewed this EKG.  ASSESSMENT AND PLAN:   Hx of CABG History of CAD status post CABG by Dr. Roxy Manns 12/27/2004 for left main/three-vessel disease.  He had a LIMA to his LAD, vein to marginal branch as well as the L PDA.  I performed cardiac catheterization on him because of chest pain 06/28/2020 revealing patent grafts with an apical LAD lesion which I decided to treat medically.  He did have a Myoview stress test performed 02/05/2022 in the distal LAD distribution.  He was begun on Imdur and this has improved his chest pain but he still gets chest pain at night before going to bed.  I am going to increase his Imdur from 60 to 90 mg a day.  HTN (hypertension) History of essential hypertension blood pressure measured today at 130/80.  He is on amlodipine and  metoprolol.  Dyslipidemia History of dyslipidemia on Praluent with lipid profile performed 02/05/2022 revealing total cholesterol 71, LDL 20 and HDL 34.  Peripheral vascular disease ([place]) History of PAD with critical ischemia.  He has known occluded SFAs bilaterally.  I did perform orbital atherectomy, PTA and stenting of a calcified distal left common iliac artery 12/06/2020.  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  Because of critical ischemia I referred him to  Dr. Donzetta Matters who performed endarterectomy of the left popliteal and tibial peroneal trunk with vein angioplasty and left below the knee popliteal artery bypass grafting with a 6 mm PTF E graft.  His wound on his left foot has ultimately healed and he was discharged from the wound care center last week.  Dr. Edd Arbour is following his Doppler studies.     Lorretta Harp MD FACP,FACC,FAHA, [facility] 04/04/2022 8:30 AM

## 2022-04-04 NOTE — Assessment & Plan Note (Signed)
History of essential hypertension blood pressure measured today at 130/80.  He is on amlodipine and metoprolol.

## 2022-04-07 ENCOUNTER — Encounter: Payer: Self-pay | Admitting: Medical

## 2022-04-07 ENCOUNTER — Other Ambulatory Visit (INDEPENDENT_AMBULATORY_CARE_PROVIDER_SITE_OTHER): Payer: Medicare HMO

## 2022-04-07 DIAGNOSIS — E785 Hyperlipidemia, unspecified: Secondary | ICD-10-CM

## 2022-04-07 DIAGNOSIS — I10 Essential (primary) hypertension: Secondary | ICD-10-CM | POA: Diagnosis not present

## 2022-04-07 DIAGNOSIS — E119 Type 2 diabetes mellitus without complications: Secondary | ICD-10-CM

## 2022-04-07 LAB — COMPREHENSIVE METABOLIC PANEL
ALT: 10 U/L (ref 0–53)
AST: 13 U/L (ref 0–37)
Albumin: 4.2 g/dL (ref 3.5–5.2)
Alkaline Phosphatase: 64 U/L (ref 39–117)
BUN: 16 mg/dL (ref 6–23)
CO2: 27 mEq/L (ref 19–32)
Calcium: 8.9 mg/dL (ref 8.4–10.5)
Chloride: 105 mEq/L (ref 96–112)
Creatinine, Ser: 0.68 mg/dL (ref 0.40–1.50)
GFR: 88.11 mL/min (ref >60.00)
Glucose, Bld: 119 mg/dL — ABNORMAL HIGH (ref 70–99)
Potassium: 3.9 mEq/L (ref 3.5–5.1)
Sodium: 141 mEq/L (ref 135–145)
Total Bilirubin: 0.7 mg/dL (ref 0.2–1.2)
Total Protein: 6.6 g/dL (ref 6.0–8.3)

## 2022-04-07 LAB — LIPID PANEL
Cholesterol: 155 mg/dL (ref 0–200)
HDL: 39.7 mg/dL (ref >39.00)
LDL Cholesterol: 96 mg/dL (ref 0–99)
NonHDL: 114.99
Total CHOL/HDL Ratio: 4
Triglycerides: 97 mg/dL (ref 0.0–149.0)
VLDL: 19.4 mg/dL (ref 0.0–40.0)

## 2022-04-07 LAB — HEMOGLOBIN A1C: Hgb A1c MFr Bld: 6.4 % (ref 4.6–6.5)

## 2022-04-08 NOTE — Telephone Encounter (Signed)
I did try to call pt today after getting back response from cardiologist. Wanted to discuss with pt recent diet and verify that he had been taking the praluent. Unclear at this point why lipid panel numbers worsened. I called pt twice by his home phone and on his cell. No answer. Will ask staff to get pt scheduled for in office visit with me.

## 2022-04-14 ENCOUNTER — Other Ambulatory Visit: Payer: Self-pay | Admitting: Medical

## 2022-04-14 ENCOUNTER — Other Ambulatory Visit: Payer: Self-pay | Admitting: Cardiovascular Disease

## 2022-04-14 NOTE — Telephone Encounter (Signed)
Pt called and lvm to return call will send letter

## 2022-04-18 ENCOUNTER — Encounter (HOSPITAL_BASED_OUTPATIENT_CLINIC_OR_DEPARTMENT_OTHER): Payer: Medicare HMO | Admitting: General Surgery

## 2022-04-21 ENCOUNTER — Ambulatory Visit (HOSPITAL_BASED_OUTPATIENT_CLINIC_OR_DEPARTMENT_OTHER)
Admission: RE | Admit: 2022-04-21 | Discharge: 2022-04-21 | Disposition: A | Discharge status: Home / Self Care | Payer: Medicare HMO | Source: Ambulatory Visit | Attending: Medical | Admitting: Medical

## 2022-04-21 ENCOUNTER — Ambulatory Visit (INDEPENDENT_AMBULATORY_CARE_PROVIDER_SITE_OTHER): Payer: Medicare HMO | Admitting: Medical

## 2022-04-21 VITALS — BP 128/50 | HR 65 | Temp 97.7°F | Resp 18 | Ht 73.0 in | Wt 221.8 lb

## 2022-04-21 DIAGNOSIS — E119 Type 2 diabetes mellitus without complications: Secondary | ICD-10-CM

## 2022-04-21 DIAGNOSIS — L739 Follicular disorder, unspecified: Secondary | ICD-10-CM | POA: Diagnosis not present

## 2022-04-21 DIAGNOSIS — L089 Local infection of the skin and subcutaneous tissue, unspecified: Secondary | ICD-10-CM

## 2022-04-21 DIAGNOSIS — M7989 Other specified soft tissue disorders: Secondary | ICD-10-CM | POA: Insufficient documentation

## 2022-04-21 DIAGNOSIS — R6883 Chills (without fever): Secondary | ICD-10-CM | POA: Diagnosis not present

## 2022-04-21 DIAGNOSIS — L299 Pruritus, unspecified: Secondary | ICD-10-CM

## 2022-04-21 DIAGNOSIS — L853 Xerosis cutis: Secondary | ICD-10-CM

## 2022-04-21 LAB — COMPREHENSIVE METABOLIC PANEL
ALT: 12 U/L (ref 0–53)
AST: 16 U/L (ref 0–37)
Albumin: 4.3 g/dL (ref 3.5–5.2)
Alkaline Phosphatase: 71 U/L (ref 39–117)
BUN: 17 mg/dL (ref 6–23)
CO2: 27 mEq/L (ref 19–32)
Calcium: 9.4 mg/dL (ref 8.4–10.5)
Chloride: 105 mEq/L (ref 96–112)
Creatinine, Ser: 0.74 mg/dL (ref 0.40–1.50)
GFR: 85.86 mL/min (ref >60.00)
Glucose, Bld: 124 mg/dL — ABNORMAL HIGH (ref 70–99)
Potassium: 3.7 mEq/L (ref 3.5–5.1)
Sodium: 140 mEq/L (ref 135–145)
Total Bilirubin: 1 mg/dL (ref 0.2–1.2)
Total Protein: 7.1 g/dL (ref 6.0–8.3)

## 2022-04-21 LAB — CBC WITH DIFFERENTIAL/PLATELET
Basophils Absolute: 0.1 10*3/uL (ref 0.0–0.1)
Basophils Relative: 1.1 % (ref 0.0–3.0)
Eosinophils Absolute: 0.6 10*3/uL (ref 0.0–0.7)
Eosinophils Relative: 6 % — ABNORMAL HIGH (ref 0.0–5.0)
HCT: 38.9 % — ABNORMAL LOW (ref 39.0–52.0)
Hemoglobin: 12.7 g/dL — ABNORMAL LOW (ref 13.0–17.0)
Lymphocytes Relative: 23 % (ref 12.0–46.0)
Lymphs Abs: 2.1 10*3/uL (ref 0.7–4.0)
MCHC: 32.8 g/dL (ref 30.0–36.0)
MCV: 84.4 fl (ref 78.0–100.0)
Monocytes Absolute: 0.7 10*3/uL (ref 0.1–1.0)
Monocytes Relative: 7.2 % (ref 3.0–12.0)
Neutro Abs: 5.8 10*3/uL (ref 1.4–7.7)
Neutrophils Relative %: 62.7 % (ref 43.0–77.0)
Platelets: 272 10*3/uL (ref 150.0–400.0)
RBC: 4.61 Mil/uL (ref 4.22–5.81)
RDW: 14.6 % (ref 11.5–15.5)
WBC: 9.2 10*3/uL (ref 4.0–10.5)

## 2022-04-21 MED ORDER — DOXYCYCLINE HYCLATE 100 MG PO TABS: 100.0000 mg | ORAL_TABLET | Freq: Two times a day (BID) | ORAL | 0 refills | Status: DC

## 2022-04-21 NOTE — Patient Instructions (Addendum)
For upper back dry skin and folliculitis recommend moisturize skin twice daily and rx doxycycline antibiotic.  For left lower extremity skin infection/early cellulitis send out wound culture and start doxycycline. Will get stat cbc and lactic acid.  Since left calf is swollen decided to go ahead and get left lower ext [place].   For diabetes- get cmp and check sugars daily. If sugars are increasing toward 200 let us know.  Follow up vascular surgeon on Wednesday.  Follow up this Friday or Monday or sooner if needed.

## 2022-04-21 NOTE — Progress Notes (Signed)
Subjective:    Patient ID: [name], male    DOB: 07-11-1940, 81 y.o.   MRN: [id number]  HPI  Pt in with recent rash on his left  leg and some on his upper back.  Rash and itching has been present for 3 weeks.   The back itches more than leg.  Some chills intermittently for one week. No definite fever. Calf has been swollen. Mostly in the morning.   Pt is diabetic. Has not been checking sugars. Last a1 was 6.4   Pt sees his vascular surgeon next Wednesday.     Review of Systems  Constitutional:  Negative for chills, fatigue and fever.  Respiratory:  Negative for cough, chest tightness, shortness of breath and wheezing.   Gastrointestinal:  Negative for abdominal pain.  Musculoskeletal:  Negative for back pain.  Skin:  Negative for rash.       See hpi.  Neurological:  Negative for dizziness, seizures, numbness and headaches.  Psychiatric/Behavioral:  Negative for behavioral problems and hallucinations.     Past Medical History:  Diagnosis Date   Claudication (HCC) 06/27/2011   Left ABIs abnormal values with mild-moderate arterial insufficiency, right SFA moderate amount of mixed density plaque elevating velocities suggestive 50-69% diameter reduction   Coronary artery disease 06/27/2011   Normal Lexiscan, EF 64, post-stress EF 64, no EKG changes   Diabetes mellitus without complication (HCC)    prediabetic   Hyperlipidemia    Hypertension    Impotence of organic origin    Osteoarthritis    Other testicular hypofunction    PVD (peripheral vascular disease) (HCC)      Social History   Socioeconomic History   Marital status: Married    Spouse name: cathy   Number of children: 1   Years of education: 12   Highest education level: Not on file  Occupational History   Occupation: retired  Tobacco Use   Smoking status: Former    Packs/day: 1.00    Years: 50.00    Total pack years: 50.00    Types: Cigarettes    Quit date: 12/17/2004    Years since quitting: 17.3    Smokeless tobacco: Never  Vaping Use   Vaping Use: Never used  Substance and Sexual Activity   Alcohol use: Yes    Comment: occasionally   Drug use: No   Sexual activity: Not on file  Other Topics Concern   Not on file  Social History Narrative   Lives with wife   Caffeine - coffee, 1 cup daily   Social Determinants of Health   Financial Resource Strain: Not on file  Food Insecurity: No Food Insecurity (02/04/2022)   Hunger Vital Sign    Worried About Running Out of Food in the Last Year: Never true    Ran Out of Food in the Last Year: Never true  Transportation Needs: No Transportation Needs (02/04/2022)   PRAPARE - Administrator, Civil Service (Medical): No    Lack of Transportation (Non-Medical): No  Physical Activity: Not on file  Stress: Not on file  Social Connections: Not on file  Intimate Partner Violence: Not At Risk (02/05/2022)   Humiliation, Afraid, Rape, and Kick questionnaire    Fear of Current or Ex-Partner: No    Emotionally Abused: No    Physically Abused: No    Sexually Abused: No    Past Surgical History:  Procedure Laterality Date   ABDOMINAL AORTOGRAM N/A 06/28/2020   Procedure: ABDOMINAL AORTOGRAM;  Surgeon: [name], MD;  Location: [facility] INVASIVE CV LAB;  Service: Cardiovascular;  Laterality: N/A;   ABDOMINAL AORTOGRAM W/LOWER EXTREMITY Left 01/27/2022   Procedure: ABDOMINAL AORTOGRAM W/LOWER EXTREMITY;  Surgeon: Maeola Harman, MD;  Location: [facility] INVASIVE CV LAB;  Service: Cardiovascular;  Laterality: Left;   CARDIAC CATHETERIZATION  12/26/2004   Left main-90% distal tapered stenosis, left circumflex-95% ostial stenosis, first marginal branch 99% ostial branch, posterior descending artery 99% ostial stenosis, 60-70% segmental mid stenosis, needed CABG   CORONARY ARTERY BYPASS GRAFT  12/27/2004   Left main 3-vessel disease, LIMA to LAD, saphenous vein to circumflexmarginal branch and to the PDA   ENDARTERECTOMY FEMORAL Left  01/28/2022   Procedure: LEFT ENDARTERECTOMYCOMMON  [facility] BELOW KNEE POPLITEAL;  Surgeon: Maeola Harman, MD;  Location: [facility] OR;  Service: Vascular;  Laterality: Left;   FEMORAL-POPLITEAL BYPASS GRAFT Left 01/28/2022   Procedure: LEFT FEMORAL-POPLITEAL ARTERY BYPASS;  Surgeon: Maeola Harman, MD;  Location: [facility] OR;  Service: Vascular;  Laterality: Left;   INGUINAL HERNIA REPAIR Left 2005   LOWER EXTREMITY ANGIOGRAPHY N/A 04/13/2017   Procedure: LOWER EXTREMITY ANGIOGRAPHY;  Surgeon: [name], MD;  Location: [facility] INVASIVE CV LAB;  Service: Cardiovascular;  Laterality: N/A;   LOWER EXTREMITY ANGIOGRAPHY Bilateral 12/06/2020   Procedure: Lower Extremity Angiography;  Surgeon: [name], MD;  Location[facility] INVASIVE CV LAB;  Service: Cardiovascular;  Laterality: Bilateral;  Limited Study   LOWER EXTREMITY INTERVENTION N/A 08/04/2016   Procedure: Lower Extremity Intervention;  Surgeon: [name], MD;  Location: [facility] INVASIVE CV LAB;  Service: Cardiovascular;  Laterality: N/A;   PERIPHERAL VASCULAR ATHERECTOMY Left 04/13/2017   Procedure: PERIPHERAL VASCULAR ATHERECTOMY;  Surgeon: [name], MD;  Location: [facility] INVASIVE CV LAB;  Service: Cardiovascular;  Laterality: Left;  SFA   PERIPHERAL VASCULAR ATHERECTOMY  12/06/2020   Procedure: PERIPHERAL VASCULAR ATHERECTOMY;  Surgeon: [name], MD;  Location: [facility] INVASIVE CV LAB;  Service: Cardiovascular;;  Lt Iliac   PERIPHERAL VASCULAR BALLOON ANGIOPLASTY Left 04/13/2017   Procedure: PERIPHERAL VASCULAR BALLOON ANGIOPLASTY;  Surgeon: [name], MD;  Location: [facility] INVASIVE CV LAB;  Service: Cardiovascular;  Laterality: Left;  SFA   PERIPHERAL VASCULAR INTERVENTION  12/06/2020   Procedure: PERIPHERAL VASCULAR INTERVENTION;  Surgeon: [name], MD;  Location: [facility] INVASIVE CV LAB;  Service: Cardiovascular;;  Lt Iliac   RIGHT/LEFT HEART CATH AND CORONARY/GRAFT ANGIOGRAPHY N/A 06/28/2020   Procedure:  RIGHT/LEFT HEART CATH AND CORONARY/GRAFT ANGIOGRAPHY;  Surgeon: [name], MD;  Location: [facility] INVASIVE CV LAB;  Service: Cardiovascular;  Laterality: N/A;    Family History  Problem Relation Age of Onset   Hypertension Mother        47   Clotting disorder Father        62   Diabetes Brother        heart surgery    Allergies  Allergen Reactions   Crestor [Rosuvastatin] Other (See Comments)    Myalgias   Lipitor [Atorvastatin] Other (See Comments)    Myalgias    Zestril [Lisinopril] Cough    Current Outpatient Medications on File Prior to Visit  Medication Sig Dispense Refill   amLODipine (NORVASC) 10 MG tablet TAKE 1 TABLET BY MOUTH EVERY DAY (Patient taking differently: Take 10 mg by mouth daily.) 90 tablet 3   aspirin 81 MG tablet Take 81 mg by mouth daily.     Cholecalciferol (VITAMIN D) 50 MCG (2000 UT) tablet Take 2,000 Units by mouth  daily.     clopidogrel (PLAVIX) 75 MG tablet TAKE 1 TABLET BY MOUTH EVERY DAY (Patient taking differently: Take 75 mg by mouth daily.) 90 tablet 3   Coenzyme Q10 (COQ10) 200 MG CAPS Take 200 mg by mouth daily.     famotidine (PEPCID) 20 MG tablet Take 1 tablet (20 mg total) by mouth daily. 90 tablet 1   fluticasone (FLONASE) 50 MCG/ACT nasal spray Place 2 sprays into both nostrils daily. 16 g 1   isosorbide mononitrate (IMDUR) 60 MG 24 hr tablet Take 1.5 tablets (90 mg total) by mouth daily. 135 tablet 3   levocetirizine (XYZAL) 5 MG tablet Take 1 tablet (5 mg total) by mouth every evening. 90 tablet 1   levofloxacin (LEVAQUIN) 750 MG tablet Take 750 mg by mouth daily. 30 day course.     meclizine (ANTIVERT) 12.5 MG tablet Take 1 tablet (12.5 mg total) by mouth 3 (three) times daily as needed for dizziness. 8 tablet 0   metFORMIN (GLUCOPHAGE) 500 MG tablet Take 1 tablet (500 mg total) by mouth 2 (two) times daily with a meal. 180 tablet 3   metoprolol succinate (TOPROL-XL) 50 MG 24 hr tablet TAKE 1 TABLET (50 MG TOTAL) BY MOUTH TWICE A  DAY .TAKE WITH OR IMMEDIATELY FOLLOWING A MEAL 180 tablet 2   nitroGLYCERIN (NITROSTAT) 0.4 MG SL tablet Place 1 tablet (0.4 mg total) under the tongue every 5 (five) minutes as needed for chest pain. 25 tablet 3   Omega-3 Fatty Acids (FISH OIL) 1200 MG CAPS Take 2 capsules (2,400 mg total) by mouth every morning.     PRALUENT 150 MG/ML SOAJ INJECT 150 MG INTO THE SKIN EVERY 14 (FOURTEEN) DAYS. 2 mL 11   No current facility-administered medications on file prior to visit.    BP (!) 128/50   Pulse 65   Temp 97.7 F (36.5 C)   Resp 18   Ht 6\' 1"  (1.854 m)   Wt 221 lb 12.8 oz (100.6 kg)   SpO2 98%   BMI 29.26 kg/m        Objective:   Physical Exam  General Mental Status- Alert. General Appearance- Not in acute distress.   Skin General: Color- Normal Color. Moisture- Normal Moisture.  Neck Carotid Arteries- Normal color. Moisture- Normal Moisture. No carotid bruits. No JVD.  Chest and Lung Exam Auscultation: Breath Sounds:-Normal.  Cardiovascular Auscultation:Rythm- Regular. Murmurs & Other Heart Sounds:Auscultation of the heart reveals- No Murmurs.  Abdomen Inspection:-Inspeection Normal. Palpation/Percussion:Note:No mass. Palpation and Percussion of the abdomen reveal- Non Tender, Non Distended + BS, no rebound or guarding.   Neurologic Cranial Nerve exam:- CN III-XII intact(No nystagmus), symmetric smile. Strength:- 5/5 equal and symmetric strength both upper and lower extremities.    Left lower ext- calf mild swollen and warm. Scattered rash and warm over prior incision line from vascular surgery.      Assessment & Plan:   Patient Instructions  For upper back dry skin and folliculitis recommend moisturize skin twice daily and rx doxycycline antibiotic.  For left lower extremity skin infection/early cellulitis send out wound culture and start doxycycline. Will get stat cbc and lactic acid.  Since left calf is swollen decided to go ahead and get left lower  ext [place].   For diabetes- get cmp and check sugars daily. If sugars are increasing toward 200 let us know.  Follow up vascular surgeon on Wednesday.  Follow up this Friday or Monday or sooner if needed.   Mackie Pai, PA-C

## 2022-04-22 MED ORDER — EZETIMIBE 10 MG PO TABS: 10.0000 mg | ORAL_TABLET | Freq: Every day | ORAL | 3 refills | Status: DC

## 2022-04-22 NOTE — Progress Notes (Unsigned)
Office Note     CC:  follow up Requesting Provider:  [name], PA-C  HPI: [name] is a 81 y.o. ([birth year]) male who presents for follow up of PAD. He is s/p :Left common femoral to below-knee popliteal artery bypass with 6 mm ringed PTFE on 01/28/22 by Dr. 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  He did have post op lymphocele but at time of his last visit on 02/19/2022 the lymphocele had stopped draining. His left leg was doing well and he was ambulating without issues. His toe wounds have been managed by the wound care center.    Pt returns today for follow up.  He states that his toes on the left foot are significantly improved. He sees wound care every couple months now.  He states he is doing well overall with ambulation. He remains very active. No pain on ambulation or rest.  The left leg is still swelling. Admits to not regularly elevating his legs. He developed a rash on the left leg and now has progressed to back and right leg. Was seen by PCP on Monday. Cultures from rash negative per patient. He was prophylactically started on antibiotics that he started yesterday. He reports no pain in rash and only the one on his back is itching.  Advised by PCP to apply lotion. This is somewhat helpful. No fever or chills. Left groin lymphocele resolved.   The pt  is on a statin for cholesterol management.  The pt is on a daily aspirin.   Other AC: Plavix The pt is on CCB, BB for hypertension.   The pt is diabetic.   Tobacco hx:  Former, quit 2006  Past Medical History:  Diagnosis Date   Claudication (HCC) 06/27/2011   Left ABIs abnormal values with mild-moderate arterial insufficiency, right SFA moderate amount of mixed density plaque elevating velocities suggestive 50-69% diameter reduction   Coronary artery disease 06/27/2011   Normal Lexiscan, EF 64, post-stress EF 64, no EKG changes   Diabetes mellitus without complication (HCC)     prediabetic   Hyperlipidemia    Hypertension    Impotence of organic origin    Osteoarthritis    Other testicular hypofunction    PVD (peripheral vascular disease) (HCC)     Past Surgical History:  Procedure Laterality Date   ABDOMINAL AORTOGRAM N/A 06/28/2020   Procedure: ABDOMINAL AORTOGRAM;  Surgeon: [name], MD;  Location: [facility] INVASIVE CV LAB;  Service: Cardiovascular;  Laterality: N/A;   ABDOMINAL AORTOGRAM W/LOWER EXTREMITY Left 01/27/2022   Procedure: ABDOMINAL AORTOGRAM W/LOWER EXTREMITY;  Surgeon: Maeola Harman, MD;  Location: [facility] INVASIVE CV LAB;  Service: Cardiovascular;  Laterality: Left;   CARDIAC CATHETERIZATION  12/26/2004   Left main-90% distal tapered stenosis, left circumflex-95% ostial stenosis, first marginal branch 99% ostial branch, posterior descending artery 99% ostial stenosis, 60-70% segmental mid stenosis, needed CABG   CORONARY ARTERY BYPASS GRAFT  12/27/2004   Left main 3-vessel disease, LIMA to LAD, saphenous vein to circumflexmarginal branch and to the PDA   ENDARTERECTOMY FEMORAL Left 01/28/2022   Procedure: LEFT ENDARTERECTOMYCOMMON  [facility] BELOW KNEE POPLITEAL;  Surgeon: Maeola Harman, MD;  Location: [facility] OR;  Service: Vascular;  Laterality: Left;   FEMORAL-POPLITEAL BYPASS GRAFT Left 01/28/2022   Procedure: LEFT FEMORAL-POPLITEAL ARTERY BYPASS;  Surgeon: Maeola Harman, MD;  Location: [facility] OR;  Service: Vascular;  Laterality: Left;   INGUINAL HERNIA REPAIR Left 2005   LOWER EXTREMITY ANGIOGRAPHY  N/A 04/13/2017   Procedure: LOWER EXTREMITY ANGIOGRAPHY;  Surgeon: [name], MD;  Location: [facility] INVASIVE CV LAB;  Service: Cardiovascular;  Laterality: N/A;   LOWER EXTREMITY ANGIOGRAPHY Bilateral 12/06/2020   Procedure: Lower Extremity Angiography;  Surgeon: [name], MD;  Location: [facility] INVASIVE CV LAB;  Service: Cardiovascular;  Laterality: Bilateral;  Limited Study   LOWER EXTREMITY INTERVENTION N/A 08/04/2016    Procedure: Lower Extremity Intervention;  Surgeon: [name], MD;  Location: [facility] INVASIVE CV LAB;  Service: Cardiovascular;  Laterality: N/A;   PERIPHERAL VASCULAR ATHERECTOMY Left 04/13/2017   Procedure: PERIPHERAL VASCULAR ATHERECTOMY;  Surgeon: [name], MD;  Location[facility] INVASIVE CV LAB;  Service: Cardiovascular;  Laterality: Left;  SFA   PERIPHERAL VASCULAR ATHERECTOMY  12/06/2020   Procedure: PERIPHERAL VASCULAR ATHERECTOMY;  Surgeon: [name], MD;  Location: [facility] INVASIVE CV LAB;  Service: Cardiovascular;;  Lt Iliac   PERIPHERAL VASCULAR BALLOON ANGIOPLASTY Left 04/13/2017   Procedure: PERIPHERAL VASCULAR BALLOON ANGIOPLASTY;  Surgeon: [name], MD;  Location: [facility] INVASIVE CV LAB;  Service: Cardiovascular;  Laterality: Left;  SFA   PERIPHERAL VASCULAR INTERVENTION  12/06/2020   Procedure: PERIPHERAL VASCULAR INTERVENTION;  Surgeon: [name], MD;  Location: [facility] INVASIVE CV LAB;  Service: Cardiovascular;;  Lt Iliac   RIGHT/LEFT HEART CATH AND CORONARY/GRAFT ANGIOGRAPHY N/A 06/28/2020   Procedure: RIGHT/LEFT HEART CATH AND CORONARY/GRAFT ANGIOGRAPHY;  Surgeon: [name], MD;  Location: [facility] INVASIVE CV LAB;  Service: Cardiovascular;  Laterality: N/A;    Social History   Socioeconomic History   Marital status: Married    Spouse name: cathy   Number of children: 1   Years of education: 12   Highest education level: Not on file  Occupational History   Occupation: retired  Tobacco Use   Smoking status: Former    Packs/day: 1.00    Years: 50.00    Total pack years: 50.00    Types: Cigarettes    Quit date: 12/17/2004    Years since quitting: 17.3   Smokeless tobacco: Never  Vaping Use   Vaping Use: Never used  Substance and Sexual Activity   Alcohol use: Yes    Comment: occasionally   Drug use: No   Sexual activity: Not on file  Other Topics Concern   Not on file  Social History Narrative   Lives with wife   Caffeine - coffee, 1 cup  daily   Social Determinants of Health   Financial Resource Strain: Not on file  Food Insecurity: No Food Insecurity (02/04/2022)   Hunger Vital Sign    Worried About Running Out of Food in the Last Year: Never true    Ran Out of Food in the Last Year: Never true  Transportation Needs: No Transportation Needs (02/04/2022)   PRAPARE - Administrator, Civil Service (Medical): No    Lack of Transportation (Non-Medical): No  Physical Activity: Not on file  Stress: Not on file  Social Connections: Not on file  Intimate Partner Violence: Not At Risk (02/05/2022)   Humiliation, Afraid, Rape, and Kick questionnaire    Fear of Current or Ex-Partner: No    Emotionally Abused: No    Physically Abused: No    Sexually Abused: No     Family History  Problem Relation Age of Onset   Hypertension Mother        43   Clotting disorder Father        50   Diabetes Brother  heart surgery    Current Outpatient Medications  Medication Sig Dispense Refill   amLODipine (NORVASC) 10 MG tablet TAKE 1 TABLET BY MOUTH EVERY DAY (Patient taking differently: Take 10 mg by mouth daily.) 90 tablet 3   aspirin 81 MG tablet Take 81 mg by mouth daily.     Cholecalciferol (VITAMIN D) 50 MCG (2000 UT) tablet Take 2,000 Units by mouth daily.     clopidogrel (PLAVIX) 75 MG tablet TAKE 1 TABLET BY MOUTH EVERY DAY (Patient taking differently: Take 75 mg by mouth daily.) 90 tablet 3   Coenzyme Q10 (COQ10) 200 MG CAPS Take 200 mg by mouth daily.     doxycycline (VIBRA-TABS) 100 MG tablet Take 1 tablet (100 mg total) by mouth 2 (two) times daily. 20 tablet 0   ezetimibe (ZETIA) 10 MG tablet Take 1 tablet (10 mg total) by mouth daily. 90 tablet 3   famotidine (PEPCID) 20 MG tablet Take 1 tablet (20 mg total) by mouth daily. 90 tablet 1   fluticasone (FLONASE) 50 MCG/ACT nasal spray Place 2 sprays into both nostrils daily. 16 g 1   isosorbide mononitrate (IMDUR) 60 MG 24 hr tablet Take 1.5 tablets (90  mg total) by mouth daily. 135 tablet 3   levocetirizine (XYZAL) 5 MG tablet Take 1 tablet (5 mg total) by mouth every evening. 90 tablet 1   levofloxacin (LEVAQUIN) 750 MG tablet Take 750 mg by mouth daily. 30 day course.     meclizine (ANTIVERT) 12.5 MG tablet Take 1 tablet (12.5 mg total) by mouth 3 (three) times daily as needed for dizziness. 8 tablet 0   metFORMIN (GLUCOPHAGE) 500 MG tablet Take 1 tablet (500 mg total) by mouth 2 (two) times daily with a meal. 180 tablet 3   metoprolol succinate (TOPROL-XL) 50 MG 24 hr tablet TAKE 1 TABLET (50 MG TOTAL) BY MOUTH TWICE A DAY .TAKE WITH OR IMMEDIATELY FOLLOWING A MEAL 180 tablet 2   nitroGLYCERIN (NITROSTAT) 0.4 MG SL tablet Place 1 tablet (0.4 mg total) under the tongue every 5 (five) minutes as needed for chest pain. 25 tablet 3   Omega-3 Fatty Acids (FISH OIL) 1200 MG CAPS Take 2 capsules (2,400 mg total) by mouth every morning.     PRALUENT 150 MG/ML SOAJ INJECT 150 MG INTO THE SKIN EVERY 14 (FOURTEEN) DAYS. 2 mL 11   No current facility-administered medications for this visit.    Allergies  Allergen Reactions   Crestor [Rosuvastatin] Other (See Comments)    Myalgias   Lipitor [Atorvastatin] Other (See Comments)    Myalgias    Zestril [Lisinopril] Cough     REVIEW OF SYSTEMS:  [X]  denotes positive finding, [ ]  denotes negative finding Cardiac  Comments:  Chest pain or chest pressure:    Shortness of breath upon exertion:    Short of breath when lying flat:    Irregular heart rhythm:        Vascular    Pain in calf, thigh, or hip brought on by ambulation:    Pain in feet at night that wakes you up from your sleep:     Blood clot in your veins:    Leg swelling:         Pulmonary    Oxygen at home:    Productive cough:     Wheezing:         Neurologic    Sudden weakness in arms or legs:     Sudden numbness in arms or legs:  Sudden onset of difficulty speaking or slurred speech:    Temporary loss of vision in one  eye:     Problems with dizziness:         Gastrointestinal    Blood in stool:     Vomited blood:         Genitourinary    Burning when urinating:     Blood in urine:        Psychiatric    Major depression:         Hematologic    Bleeding problems:    Problems with blood clotting too easily:        Skin    Rashes or ulcers:        Constitutional    Fever or chills:      PHYSICAL EXAMINATION:  Vitals:   04/23/22 1014  BP: 137/76  Pulse: 64  Temp: 98.7 F (37.1 C)  TempSrc: Temporal  SpO2: 97%  Weight: 218 lb (98.9 kg)    General:  WDWN in NAD; vital signs documented above Gait: Normal HENT: WNL, normocephalic Pulmonary: normal non-labored breathing , without wheezing Cardiac: regular HR, without  Murmurs without carotid bruit Abdomen: soft, NT, no masses Vascular Exam/Pulses:  Right Left  Radial 2+ (normal) 2+ (normal)  Femoral 2+ (normal) 2+ (normal)  Popliteal Not palpable Not palpable  DP 2+ (normal) Not palpable  PT 1+ (weak) 1+ (weak)   Extremities: without ischemic changes, without Gangrene , without cellulitis; without open wounds; Maculopapular rash on Left leg, macular rash on distal right leg. Mild edema of left leg. Left  great toe dressed Musculoskeletal: no muscle wasting or atrophy  Neurologic: A&O X 3;  No focal weakness or paresthesias are detected Psychiatric:  The pt has Normal affect.   Non-Invasive Vascular Imaging:   +-------+-----------+-----------+------------+------------+  ABI/TBIToday's ABIToday's TBIPrevious ABIPrevious TBI  +-------+-----------+-----------+------------+------------+  Right 1.11       0.35       0.64        0.42          +-------+-----------+-----------+------------+------------+  Left  1.11       bandaged   0.45        open wound    +-------+-----------+-----------+------------+------------+   VAS US Lower Extremity bypass graft duplex: Summary:  Left: Patent left femoral to popliteal  bypass graft with no visualized stenosis. Tibial artery occlusive disease.   ASSESSMENT/PLAN:: 81 y.o. male here for follow up for PAD. He is s/p :Left common femoral to below-knee popliteal artery bypass with 6 mm ringed PTFE on 01/28/22 by Dr. 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  He did have post op lymphocele but this is now resolved. His toe wounds are managed by the wound care center. - Duplex today shows patent LLE bypass graft. ABI shows significant improvement post operatively - Continue Aspirin, Zetia, Praluent, Plavix - Encourage patient to elevate his left lower extremity  - patient understands to call for earlier follow up if he has any new or concerning symptoms - Follow up with PCP regarding rash  - Patient will follow up in 6 months with repeat bypass graft duplex and ABI's   Graceann Congressorrina Rakan Soffer, PA-C [facility] [phone]  Clinic MD:   [place]/ Randie Heinzain

## 2022-04-22 NOTE — Addendum Note (Signed)
Addended by: Gwenevere Abbot on: 04/22/2022 12:31 PM   Modules accepted: Orders

## 2022-04-23 ENCOUNTER — Ambulatory Visit (INDEPENDENT_AMBULATORY_CARE_PROVIDER_SITE_OTHER)
Admission: RE | Admit: 2022-04-23 | Discharge: 2022-04-23 | Disposition: A | Discharge status: Home / Self Care | Payer: Medicare HMO | Source: Ambulatory Visit | Attending: Vascular Surgery | Admitting: Vascular Surgery

## 2022-04-23 ENCOUNTER — Ambulatory Visit: Payer: Medicare HMO | Admitting: Physician Assistant

## 2022-04-23 ENCOUNTER — Ambulatory Visit (HOSPITAL_COMMUNITY)
Admission: RE | Admit: 2022-04-23 | Discharge: 2022-04-23 | Disposition: A | Discharge status: Home / Self Care | Payer: Medicare HMO | Source: Ambulatory Visit | Attending: Vascular Surgery | Admitting: Vascular Surgery

## 2022-04-23 VITALS — BP 137/76 | HR 64 | Temp 98.7°F | Wt 218.0 lb

## 2022-04-23 DIAGNOSIS — I739 Peripheral vascular disease, unspecified: Secondary | ICD-10-CM

## 2022-04-23 LAB — LACTIC ACID, PLASMA: LACTIC ACID: 2 mmol/L — ABNORMAL HIGH (ref 0.4–1.8)

## 2022-04-24 LAB — WOUND CULTURE
MICRO NUMBER:: 14265271
SPECIMEN QUALITY:: ADEQUATE

## 2022-04-25 ENCOUNTER — Encounter (HOSPITAL_BASED_OUTPATIENT_CLINIC_OR_DEPARTMENT_OTHER): Payer: Medicare HMO | Attending: General Surgery | Admitting: Internal Medicine

## 2022-04-25 DIAGNOSIS — L308 Other specified dermatitis: Secondary | ICD-10-CM | POA: Diagnosis not present

## 2022-04-25 DIAGNOSIS — I7389 Other specified peripheral vascular diseases: Secondary | ICD-10-CM | POA: Diagnosis not present

## 2022-04-25 DIAGNOSIS — I10 Essential (primary) hypertension: Secondary | ICD-10-CM | POA: Insufficient documentation

## 2022-04-25 DIAGNOSIS — R7303 Prediabetes: Secondary | ICD-10-CM | POA: Insufficient documentation

## 2022-04-25 DIAGNOSIS — Z87891 Personal history of nicotine dependence: Secondary | ICD-10-CM | POA: Diagnosis not present

## 2022-04-25 DIAGNOSIS — L97524 Non-pressure chronic ulcer of other part of left foot with necrosis of bone: Secondary | ICD-10-CM | POA: Diagnosis not present

## 2022-04-25 NOTE — Progress Notes (Addendum)
[place], [place] ([id number]) [id number] Page 1 of 7 Visit Report for 04/25/2022 Chief Complaint Document Details Patient Name: Date of Service: [name] [facility] [place] RD 04/25/2022 9:45 A M Medical Record Number: [id number] Patient Account Number: [id number] Date of Birth/Sex: Treating RN: 1940/06/12 (81 y.o. M) Primary Care Provider: [name] Other Clinician: Referring Provider: Treating Provider/Extender: Odis Hollingshead in Treatment: 16 Information Obtained from: Patient Chief Complaint Left 1st and 2nd toe ulcers 04/25/2022; evaluation of dry patchy rash and reassurance of closed wound to the left great toe Electronic Signature(s) Signed: 04/25/2022 12:38:41 PM By: Geralyn Corwin DO Entered By: Geralyn Corwin on 04/25/2022 11:22:37 -------------------------------------------------------------------------------- HPI Details Patient Name: Date of Service: [name], A LFO RD 04/25/2022 9:45 A M Medical Record Number: [id number] Patient Account Number: [id number] Date of Birth/Sex: Treating RN: 10-Jul-1940 (81 y.o. M) Primary Care Provider: [name] Other Clinician: Referring Provider: Treating Provider/Extender: Odis Hollingshead in Treatment: 16 History of Present Illness HPI Description: 01-01-2022 upon evaluation today patient appears to be doing poorly in regard to her wound that is on the left great toe and second toe location. The toenail of the second toe covers over a portion of the wound which I am concerned may be hiding a piece of exposed bone based on what I see today. The patient does have a history of peripheral vascular disease he is a patient of Dr. Gery Pray who did an abdominal aortogram previous and was unable to intervene with regard to the lower extremity blood flow. The patient had a right ABI which was noncompressible and a left ABI was 0.6 with TBI's of 0.26 unfortunately. The TBI on the right  was 0.62 which was not nearly as bad. Nonetheless the unfortunate thing is that with poor blood flow and potential for bone exposure this becomes increasingly complicated if the patient does have osteomyelitis to get this to heal. He does have repeat vascular testing on Monday upcoming. The patient is stated to be "prediabetic" according to notes and the patient. Currently he has been on Keflex. Patient has a history of peripheral vascular disease, prediabetes, hypertension, and unfortunately the current wound which has been present for 4 weeks based on what they tell me today. 01-08-2022 upon evaluation today patient appears to be doing about the same in regard to his wound. Again the x-ray was negative that was performed on 17th everything looks to be okay. With that being said he is continuing to have issues here with poor blood flow and perfusion he did have a repeat arterial study and ABI with TBI TBI could not even be obtained because the signals were so dampened. Previously he had been at 0.2. In regard to the ABI was previously around 0.65 he was now around 0.45. Again this was definitely a decrease on the left side compared to previous the right was stable. He is stated to have critical limb ischemia. 01-15-2022 upon evaluation today patient appears to be doing well currently in regard to his wounds all things considered. Were seeing more growth on the big toe versus the second toe but this is to be expected at the second toe does have some bone exposed. Fortunately I do not see any evidence of active infection at this time. He does have an appointment scheduled for September 11 which is Monday for an arteriogram and then subsequently the 12th for bypass surgery. Subsequently I think this is can be very good for him as far as trying  to get this healed I believe it is good to be the best plan that we have as far as getting good blood flow to allow for healing to continue. Once we get good blood  flow I think we can then clean away some of the necrotic bone if necessary to try and allow this to heal more effectively. We also discussed briefly hyperbaric oxygen therapy and the possibility of distal toe amputation if need be but again right now we are still on the plantar stages of even knowing what we can do from a wound care perspective which I think is the first initial goal here. 01-22-2022 upon evaluation today patient appears to be doing better currently in regard to his wounds all things considered. He actually is good to be having his vascular procedures upcoming next week. Nonetheless I think once we get better blood flow will be able to see where things stand. He is also going to have his MRI on Sunday which I think will also be very beneficial for [place] to know exactly what is going on with the second toe in particular and the way we will get a proceed following. [place], [place] ([id number]) [id number] Page 2 of 7 02-12-2022 upon evaluation today patient appears to be doing well currently in regard to his wounds in fact he is showing signs of significant improvement which is great news. Fortunately I see no evidence of active infection locally or systemically at this time which is great news he has had his bypass on the left lower extremity and this does seem to be doing much better compared to what we were previous. He is in fact about twice a small on the great toe as it was last time I saw him I am very pleased with where things stand with the second toe as well though there is still bone exposed there seems to be excellent blood flow which I think in turn is can allow this to actually heal that is what we want. 02-19-2022 upon evaluation today patient appears to be doing excellent in regard to his wounds. He is going require some sharp debridement but to be honest I think he should really do quite well with this. He did have a lot more bleeding from the second  toe after I did clean away some of the necrotic bone last week this week I think it should bleed nearly as much which should help and even more. 02-26-2022 patient appears to be doing well currently in regard to his wounds in fact the second toe looks like it could potentially even be healed although we will get a monitor 1 more week. The great toe is significantly smaller and is healing in an excellent fashion and very pleased in that regard. 10/18; the patient's second toe is healed as far as I can tell. The great toe has 2 open areas both of which look healthy and are measuring smaller. Primary dressing on the left first toe is collagen He has 5 days worth of Levaquin apparently has been on that for 2 months. I cannot see any reason to continue this at this point.He is 4 weeks status post his revascularization 03-12-2022 upon evaluation today patient appears to be doing excellent in regard to his wound in fact this is significantly smaller compared to previous. I am extremely pleased and I do believe that we are on the right track here. I am going to go ahead and perform some debridement today just to clearway some  of the necrotic debris on the surface of the wound. He is done with the Levaquin. 03-19-2022 upon evaluation today patient appears to be doing well currently in regard to his wound on the great toe. Fortunately there does not appear to be any signs of infection which is great news and overall I am extremely pleased with where we stand today. I think that he is very close to complete resolution there is some need for sharp debridement today. 11/8; the patient is fully epithelialized today. There is not appear to be any signs of infection. He has been using Prisma Xeroform 04/25/2022 Patient presents for evaluation of patchy dry flaky skin to the lower extremities bilaterally That he developed 1 to 2 weeks ago. He has tried lotion to this area. He has seen his primary care office for this  issue and has been prescribed doxycycline. Currently there are no signs of infection including increased warmth, erythema or purulent drainage.Marland Kitchen He would also like to reassure that there is no open wound to the left great toe. He has been followed in our clinic closely for a toe wound that was healed on 11/8. This still remains healed with epithelization intact. Electronic Signature(s) Signed: 04/25/2022 12:38:41 PM By: Geralyn Corwin DO Entered By: Geralyn Corwin on 04/25/2022 11:24:48 -------------------------------------------------------------------------------- Physical Exam Details Patient Name: Date of Service: [name], A LFO RD 04/25/2022 9:45 A M Medical Record Number: [id number] Patient Account Number: [id number] Date of Birth/Sex: Treating RN: 21-Nov-1940 (81 y.o. M) Primary Care Provider: [name] Other Clinician: Referring Provider: Treating Provider/Extender: Odis Hollingshead in Treatment: 16 Constitutional respirations regular, non-labored and within target range for patient.. Cardiovascular 2+ dorsalis pedis/posterior tibialis pulses. Psychiatric pleasant and cooperative. Notes Dry, flaky patchy areas to the lower extremities. No signs of surrounding infection. T the left great toe there are no open wounds. o Electronic Signature(s) Signed: 04/25/2022 12:38:41 PM By: Geralyn Corwin DO Entered By: Geralyn Corwin on 04/25/2022 11:25:28 [place], Vasili ([id number]) [id number] Page 3 of 7 -------------------------------------------------------------------------------- Physician Orders Details Patient Name: Date of Service: [name] LFO RD 04/25/2022 9:45 A M Medical Record Number: [id number] Patient Account Number: [id number] Date of Birth/Sex: Treating RN: June 26, 1940 (81 y.o. [name] Primary Care Provider: [name] Other Clinician: Referring Provider: Treating Provider/Extender: Odis Hollingshead in Treatment: [phone] Verbal / Phone Orders: No Diagnosis Coding Follow-up Appointments Other: - Follow up with your Primary Doctor on Monday. We are prescribing you a steroid cream and sending it to your pharmacy! Patient Medications llergies: Crestor, Lipitor, lisinopril A Notifications Medication Indication Start End 04/25/2022 triamcinolone acetonide DOSE 1 - topical 0.1 % cream - Apply daily to dry, flaky skin Electronic Signature(s) Signed: 04/25/2022 12:38:41 PM By: Geralyn Corwin DO Previous Signature: 04/25/2022 10:22:37 AM Version By: Geralyn Corwin DO Entered By: Geralyn Corwin on 04/25/2022 11:25:35 -------------------------------------------------------------------------------- Problem List Details Patient Name: Date of Service: [name], A LFO RD 04/25/2022 9:45 A M Medical Record Number: [id number] Patient Account Number: [id number] Date of Birth/Sex: Treating RN: 1941-04-24 (81 y.o. M) Primary Care Provider: [name] Other Clinician: Referring Provider: Treating Provider/Extender: Odis Hollingshead in Treatment: 16 Active Problems ICD-10 Encounter Code Description Active Date MDM Diagnosis I73.89 Other specified peripheral vascular diseases 01/01/2022 No Yes L97.522 Non-pressure chronic ulcer of other part of left foot with fat layer exposed 01/01/2022 No Yes L97.524 Non-pressure chronic ulcer of other part of left foot with necrosis of bone 02/12/2022 No Yes  R73.03 Prediabetes 01/01/2022 No Yes [place], [place] ([id number]) [phone].pdf Page 4 of 7 I10 Essential (primary) hypertension 01/01/2022 No Yes L30.8 Other specified dermatitis 04/25/2022 No Yes Inactive Problems Resolved Problems Electronic Signature(s) Signed: 04/25/2022 12:38:41 PM By: Geralyn Corwin DO Entered By: Geralyn Corwin on 04/25/2022  11:22:02 -------------------------------------------------------------------------------- Progress Note Details Patient Name: Date of Service: [name], A LFO RD 04/25/2022 9:45 A M Medical Record Number: [id number] Patient Account Number: [id number] Date of Birth/Sex: Treating RN: Sep 22, 1940 (81 y.o. M) Primary Care Provider: [name] Other Clinician: Referring Provider: Treating Provider/Extender: Odis Hollingshead in Treatment: 16 Subjective Chief Complaint Information obtained from Patient Left 1st and 2nd toe ulcers 04/25/2022; evaluation of dry patchy rash and reassurance of closed wound to the left great toe History of Present Illness (HPI) 01-01-2022 upon evaluation today patient appears to be doing poorly in regard to her wound that is on the left great toe and second toe location. The toenail of the second toe covers over a portion of the wound which I am concerned may be hiding a piece of exposed bone based on what I see today. The patient does have a history of peripheral vascular disease he is a patient of Dr. Gery Pray who did an abdominal aortogram previous and was unable to intervene with regard to the lower extremity blood flow. The patient had a right ABI which was noncompressible and a left ABI was 0.6 with TBI's of 0.26 unfortunately. The TBI on the right was 0.62 which was not nearly as bad. Nonetheless the unfortunate thing is that with poor blood flow and potential for bone exposure this becomes increasingly complicated if the patient does have osteomyelitis to get this to heal. He does have repeat vascular testing on Monday upcoming. The patient is stated to be "prediabetic" according to notes and the patient. Currently he has been on Keflex. Patient has a history of peripheral vascular disease, prediabetes, hypertension, and unfortunately the current wound which has been present for 4 weeks based on what they tell me today. 01-08-2022 upon  evaluation today patient appears to be doing about the same in regard to his wound. Again the x-ray was negative that was performed on 17th everything looks to be okay. With that being said he is continuing to have issues here with poor blood flow and perfusion he did have a repeat arterial study and ABI with TBI TBI could not even be obtained because the signals were so dampened. Previously he had been at 0.2. In regard to the ABI was previously around 0.65 he was now around 0.45. Again this was definitely a decrease on the left side compared to previous the right was stable. He is stated to have critical limb ischemia. 01-15-2022 upon evaluation today patient appears to be doing well currently in regard to his wounds all things considered. Were seeing more growth on the big toe versus the second toe but this is to be expected at the second toe does have some bone exposed. Fortunately I do not see any evidence of active infection at this time. He does have an appointment scheduled for September 11 which is Monday for an arteriogram and then subsequently the 12th for bypass surgery. Subsequently I think this is can be very good for him as far as trying to get this healed I believe it is good to be the best plan that we have as far as getting good blood flow to allow for healing to continue. Once we get good  blood flow I think we can then clean away some of the necrotic bone if necessary to try and allow this to heal more effectively. We also discussed briefly hyperbaric oxygen therapy and the possibility of distal toe amputation if need be but again right now we are still on the plantar stages of even knowing what we can do from a wound care perspective which I think is the first initial goal here. 01-22-2022 upon evaluation today patient appears to be doing better currently in regard to his wounds all things considered. He actually is good to be having his vascular procedures upcoming next week.  Nonetheless I think once we get better blood flow will be able to see where things stand. He is also going to have his MRI on Sunday which I think will also be very beneficial for [place] to know exactly what is going on with the second toe in particular and the way we will get a proceed following. 02-12-2022 upon evaluation today patient appears to be doing well currently in regard to his wounds in fact he is showing signs of significant improvement which is great news. Fortunately I see no evidence of active infection locally or systemically at this time which is great news he has had his bypass on the left lower extremity and this does seem to be doing much better compared to what we were previous. He is in fact about twice a small on the great toe as it was last time I saw him I am very pleased with where things stand with the second toe as well though there is still bone exposed there seems to be excellent blood flow which I think in turn is can allow this to actually heal that is what we want. 02-19-2022 upon evaluation today patient appears to be doing excellent in regard to his wounds. He is going require some sharp debridement but to be honest I think he should really do quite well with this. He did have a lot more bleeding from the second toe after I did clean away some of the necrotic bone last week this week I think it should bleed nearly as much which should help and even more. [place], [place] ([id number]) [id number] Page 5 of 7 02-26-2022 patient appears to be doing well currently in regard to his wounds in fact the second toe looks like it could potentially even be healed although we will get a monitor 1 more week. The great toe is significantly smaller and is healing in an excellent fashion and very pleased in that regard. 10/18; the patient's second toe is healed as far as I can tell. The great toe has 2 open areas both of which look healthy and are measuring smaller.  Primary dressing on the left first toe is collagen He has 5 days worth of Levaquin apparently has been on that for 2 months. I cannot see any reason to continue this at this point.He is 4 weeks status post his revascularization 03-12-2022 upon evaluation today patient appears to be doing excellent in regard to his wound in fact this is significantly smaller compared to previous. I am extremely pleased and I do believe that we are on the right track here. I am going to go ahead and perform some debridement today just to clearway some of the necrotic debris on the surface of the wound. He is done with the Levaquin. 03-19-2022 upon evaluation today patient appears to be doing well currently in regard to his wound on the  great toe. Fortunately there does not appear to be any signs of infection which is great news and overall I am extremely pleased with where we stand today. I think that he is very close to complete resolution there is some need for sharp debridement today. 11/8; the patient is fully epithelialized today. There is not appear to be any signs of infection. He has been using Prisma Xeroform 04/25/2022 Patient presents for evaluation of patchy dry flaky skin to the lower extremities bilaterally That he developed 1 to 2 weeks ago. He has tried lotion to this area. He has seen his primary care office for this issue and has been prescribed doxycycline. Currently there are no signs of infection including increased warmth, erythema or purulent drainage.Marland Kitchen. He would also like to reassure that there is no open wound to the left great toe. He has been followed in our clinic closely for a toe wound that was healed on 11/8. This still remains healed with epithelization intact. Patient History Information obtained from Patient, Chart. Family History Unknown History. Social History Former smoker, Marital Status - Married, Alcohol Use - Rarely, Drug Use - No History, Caffeine Use - Rarely. Medical  History Cardiovascular Patient has history of Coronary Artery Disease, Hypotension, Peripheral Arterial Disease, Peripheral Venous Disease Denies history of Angina, Arrhythmia, Congestive Heart Failure, Deep Vein Thrombosis, Myocardial Infarction, Phlebitis, Vasculitis Endocrine Patient has history of Type II Diabetes Integumentary (Skin) Denies history of History of Burn Musculoskeletal Patient has history of Osteoarthritis Medical A Surgical History Notes nd Cardiovascular hyperilipidemia, claudication, LEFT ABI'S ABNORMAL Objective Constitutional respirations regular, non-labored and within target range for patient.. Vitals Time Taken: 9:24 AM, Temperature: 97.6 F, Pulse: 65 bpm, Respiratory Rate: 18 breaths/min, Blood Pressure: 128/68 mmHg. Cardiovascular 2+ dorsalis pedis/posterior tibialis pulses. Psychiatric pleasant and cooperative. General Notes: Dry, flaky patchy areas to the lower extremities. No signs of surrounding infection. T the left great toe there are no open wounds. 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 I recommended keeping this area protected. He has developed a dry flaky patchy rash to his lower extremities bilaterally. It appears similar to eczema. No signs of infection. I recommended triamcinolone cream to the area. If this were to get worse he knows to call us. If it does not resolve then he needs to follow-up with his primary care physician. Plan Follow-up Appointments: Other: - Follow up with your Primary Doctor on Monday. We are prescribing you a steroid cream  and sending it to your pharmacy! The following medication(s) was prescribed: triamcinolone acetonide topical 0.1 % cream 1 Apply daily to dry, flaky skin starting 04/25/2022 1. Triamcinolone cream 2. Follow-up as needed Electronic Signature(s) Signed: 04/25/2022 12:38:41 PM By: Geralyn CorwinHoffman, Jaz Laningham DO Entered By: Geralyn CorwinHoffman, Simrah Chatham on 04/25/2022 11:26:57 -------------------------------------------------------------------------------- HxROS Details Patient Name: Date of Service: Richard BaldyWO O D, A LFO RD 04/25/2022 9:45 A M Medical Record Number: [id number] Patient Account Number: [id number] Date of Birth/Sex: Treating RN: 1940/06/08 (81 y.o. M) Primary Care Provider: Esperanza RichtersSaguier, Edward Other Clinician: Referring Provider: Treating Provider/Extender: Odis HollingsheadHoffman, Anahid Eskelson Saguier, Edward Weeks in Treatment: 16 Information Obtained From Patient Chart Cardiovascular Medical History: Positive for: Coronary Artery Disease; Hypotension; Peripheral Arterial Disease; Peripheral Venous Disease  Negative for: Angina; Arrhythmia; Congestive Heart Failure; Deep Vein Thrombosis; Myocardial Infarction; Phlebitis; Vasculitis Past Medical History Notes: hyperilipidemia, claudication, LEFT ABI'S ABNORMAL Endocrine Medical History: Positive for: Type II Diabetes Blood sugar tested every day: Yes Tested : Integumentary (Skin) Medical History: Negative for: History of Burn Musculoskeletal Medical History: Positive for: Osteoarthritis Immunizations Pneumococcal Vaccine: Received Pneumococcal Vaccination: Yes Received Pneumococcal Vaccination On or After [address] Diod[place], [place] ([id number]) [id number] Page 7 of 7 Implantable Devices No devices added Family and Social History Unknown History: Yes; Former smoker; Marital Status - Married; Alcohol Use: Rarely; Drug Use: No History; Caffeine Use: Rarely; Financial Concerns: No; Food, Clothing or Shelter Needs: No; Support System Lacking:  No; Transportation Concerns: No Electronic Signature(s) Signed: 04/25/2022 12:38:41 PM By: Geralyn Corwin DO Entered By: Geralyn Corwin on 04/25/2022 11:24:54 -------------------------------------------------------------------------------- SuperBill Details Patient Name: Date of Service: [name], A LFO RD 04/25/2022 Medical Record Number: [id number] Patient Account Number: [id number] Date of Birth/Sex: Treating RN: Jun 16, 1940 (81 y.o. 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 R73.03 Prediabetes I10 Essential (primary) hypertension L30.8 Other specified dermatitis Facility Procedures : CPT4 Code: 78295621 Description: [phone] - WOUND CARE VISIT-LEV 2 EST PT Modifier: Quantity: 1 Physician Procedures : CPT4 Code Description Modifier [id number] - WC PHYS LEVEL 3 - EST PT ICD-10 Diagnosis Description L30.8 Other specified dermatitis R73.03 Prediabetes I10 Essential (primary) hypertension I73.89 Other specified peripheral vascular diseases Quantity: 1 Electronic Signature(s) Signed: 05/07/2022 8:22:10 AM By: Pearletha Alfred Signed: 05/13/2022 3:20:18 PM By: Geralyn Corwin DO Previous Signature: 05/07/2022 8:20:39 AM Version By: Pearletha Alfred Previous Signature: 04/25/2022 12:38:41 PM Version By: Geralyn Corwin DO Entered By: Pearletha Alfred on 05/07/2022 08:22:10

## 2022-04-28 ENCOUNTER — Ambulatory Visit (INDEPENDENT_AMBULATORY_CARE_PROVIDER_SITE_OTHER): Payer: Medicare HMO | Admitting: Medical

## 2022-04-28 VITALS — BP 130/60 | HR 68 | Temp 98.2°F | Resp 18 | Ht 73.0 in | Wt 221.0 lb

## 2022-04-28 DIAGNOSIS — L853 Xerosis cutis: Secondary | ICD-10-CM | POA: Diagnosis not present

## 2022-04-28 DIAGNOSIS — L089 Local infection of the skin and subcutaneous tissue, unspecified: Secondary | ICD-10-CM

## 2022-04-28 LAB — CBC WITH DIFFERENTIAL/PLATELET
Basophils Absolute: 0.1 10*3/uL (ref 0.0–0.1)
Basophils Relative: 0.8 % (ref 0.0–3.0)
Eosinophils Absolute: 0.3 10*3/uL (ref 0.0–0.7)
Eosinophils Relative: 2.6 % (ref 0.0–5.0)
HCT: 39.5 % (ref 39.0–52.0)
Hemoglobin: 13.2 g/dL (ref 13.0–17.0)
Lymphocytes Relative: 22.2 % (ref 12.0–46.0)
Lymphs Abs: 2.3 10*3/uL (ref 0.7–4.0)
MCHC: 33.3 g/dL (ref 30.0–36.0)
MCV: 84.5 fl (ref 78.0–100.0)
Monocytes Absolute: 1 10*3/uL (ref 0.1–1.0)
Monocytes Relative: 9.9 % (ref 3.0–12.0)
Neutro Abs: 6.8 10*3/uL (ref 1.4–7.7)
Neutrophils Relative %: 64.5 % (ref 43.0–77.0)
Platelets: 269 10*3/uL (ref 150.0–400.0)
RBC: 4.67 Mil/uL (ref 4.22–5.81)
RDW: 15.2 % (ref 11.5–15.5)
WBC: 10.5 10*3/uL (ref 4.0–10.5)

## 2022-04-28 NOTE — Progress Notes (Signed)
Subjective:    Patient ID: [name], male    DOB: October 24, 1940, 81 y.o.   MRN: [id number]  HPI   Pt here for follow up.  Last visit A/P below in ".  "For upper back dry skin and folliculitis recommend moisturize skin twice daily and rx doxycycline antibiotic.   For left lower extremity skin infection/early cellulitis send out wound culture and start doxycycline. Will get stat cbc and lactic acid.   Since left calf is swollen decided to go ahead and get left lower ext [place].    For diabetes- get cmp and check sugars daily. If sugars are increasing toward 200 let us know.   Follow up vascular surgeon on Wednesday."   Wound culture did not grow out any specific bacteria. No feve, no chills or sweats. Pt is on doxy. The are on his calf looks and feels better per pt. His wbc was not elevated but lactic acid was.    Pt did see vascular surgeon office. Below is surgeon A/P  "ASSESSMENT/PLAN:: 81 y.o. male here for follow up for PAD. He is s/p :Left common femoral to below-knee popliteal artery bypass with 6 mm ringed PTFE on 01/28/22 by Dr. 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  He did have post op lymphocele but this is now resolved. His toe wounds are managed by the wound care center. - Duplex today shows patent LLE bypass graft. ABI shows significant improvement post operatively - Continue Aspirin, Zetia, Praluent, Plavix - Encourage patient to elevate his left lower extremity  - patient understands to call for earlier follow up if he has any new or concerning symptoms - Follow up with PCP regarding rash  - Patient will follow up in 6 months with repeat bypass graft duplex and ABI's"  I had advsied moisturizing cream cera ve and rash on back has helped. Also applied to legs and lower ext rash improved.  Wound culture did not growt out any      Review of Systems  Constitutional:  Negative for chills, fatigue and fever.   Respiratory:  Negative for cough, chest tightness, shortness of breath and wheezing.   Cardiovascular:  Negative for chest pain and palpitations.  Gastrointestinal:  Negative for abdominal pain.  Genitourinary:  Negative for dysuria.  Skin:        Dry skin.  Neurological:  Negative for dizziness, speech difficulty, weakness, numbness and headaches.  Hematological:  Negative for adenopathy. Does not bruise/bleed easily.  Psychiatric/Behavioral:  Negative for behavioral problems and confusion.     Past Medical History:  Diagnosis Date   Claudication (HCC) 06/27/2011   Left ABIs abnormal values with mild-moderate arterial insufficiency, right SFA moderate amount of mixed density plaque elevating velocities suggestive 50-69% diameter reduction   Coronary artery disease 06/27/2011   Normal Lexiscan, EF 64, post-stress EF 64, no EKG changes   Diabetes mellitus without complication (HCC)    prediabetic   Hyperlipidemia    Hypertension    Impotence of organic origin    Osteoarthritis    Other testicular hypofunction    PVD (peripheral vascular disease) (HCC)      Social History   Socioeconomic History   Marital status: Married    Spouse name: cathy   Number of children: 1   Years of education: 12   Highest education level: Not on file  Occupational History   Occupation: retired  Tobacco Use   Smoking status: Former    Packs/day:  1.00    Years: 50.00    Total pack years: 50.00    Types: Cigarettes    Quit date: 12/17/2004    Years since quitting: 17.3   Smokeless tobacco: Never  Vaping Use   Vaping Use: Never used  Substance and Sexual Activity   Alcohol use: Yes    Comment: occasionally   Drug use: No   Sexual activity: Not on file  Other Topics Concern   Not on file  Social History Narrative   Lives with wife   Caffeine - coffee, 1 cup daily   Social Determinants of Health   Financial Resource Strain: Not on file  Food Insecurity: No Food Insecurity (02/04/2022)    Hunger Vital Sign    Worried About Running Out of Food in the Last Year: Never true    Ran Out of Food in the Last Year: Never true  Transportation Needs: No Transportation Needs (02/04/2022)   PRAPARE - Administrator, Civil ServiceTransportation    Lack of Transportation (Medical): No    Lack of Transportation (Non-Medical): No  Physical Activity: Not on file  Stress: Not on file  Social Connections: Not on file  Intimate Partner Violence: Not At Risk (02/05/2022)   Humiliation, Afraid, Rape, and Kick questionnaire    Fear of Current or Ex-Partner: No    Emotionally Abused: No    Physically Abused: No    Sexually Abused: No    Past Surgical History:  Procedure Laterality Date   ABDOMINAL AORTOGRAM N/A 06/28/2020   Procedure: ABDOMINAL AORTOGRAM;  Surgeon: Runell GessBerry, Jonathan J, MD;  [facility];  Service: Cardiovascular;  Laterality: N/A;   ABDOMINAL AORTOGRAM W/LOWER EXTREMITY Left 01/27/2022   Procedure: ABDOMINAL AORTOGRAM W/LOWER EXTREMITY;  Surgeon: Maeola Harmanain, Brandon Christopher, MD;  [facility];  Service: Cardiovascular;  Laterality: Left;   CARDIAC CATHETERIZATION  12/26/2004   Left main-90% distal tapered stenosis, left circumflex-95% ostial stenosis, first marginal branch 99% ostial branch, posterior descending artery 99% ostial stenosis, 60-70% segmental mid stenosis, needed CABG   CORONARY ARTERY BYPASS GRAFT  12/27/2004   Left main 3-vessel disease, LIMA to LAD, saphenous vein to circumflexmarginal branch and to the PDA   ENDARTERECTOMY FEMORAL Left 01/28/2022   Procedure: LEFT ENDARTERECTOMYCOMMON  [facility]OW [facility] POPLITEAL;  Surgeon: Maeola Harmanain, Brandon Christopher, MD;  [facility];  Service: Vascular;  Laterality: Left;   FEMORAL-POPLITEAL BYPASS GRAFT Left 01/28/2022   Procedure: LEFT FEMORAL-POPLITEAL ARTERY BYPASS;  Surgeon: Maeola Harmanain, Brandon Christopher, MD;  [facility];  Service: Vascular;  Laterality: Left;   INGUINAL HERNIA REPAIR Left 2005   LOWER EXTREMITY  ANGIOGRAPHY N/A 04/13/2017   Procedure: LOWER EXTREMITY ANGIOGRAPHY;  Surgeon: Runell GessBerry, Jonathan J, MD;  [facility];  Service: Cardiovascular;  Laterality: N/A;   LOWER EXTREMITY ANGIOGRAPHY Bilateral 12/06/2020   Procedure: Lower Extremity Angiography;  Surgeon: Runell GessBerry, Jonathan J, MD;  [facility];  Service: Cardiovascular;  Laterality: Bilateral;  Limited Study   LOWER EXTREMITY INTERVENTION N/A 08/04/2016   Procedure: Lower Extremity Intervention;  Surgeon: Runell GessJonathan J Berry, MD;  [facility];  Service: Cardiovascular;  Laterality: N/A;   PERIPHERAL VASCULAR ATHERECTOMY Left 04/13/2017   Procedure: PERIPHERAL VASCULAR ATHERECTOMY;  Surgeon: Runell GessBerry, Jonathan J, MD;  [facility];  Service: Cardiovascular;  Laterality: Left;  SFA   PERIPHERAL VASCULAR ATHERECTOMY  12/06/2020   Procedure: PERIPHERAL VASCULAR ATHERECTOMY;  Surgeon: Runell GessBerry, Jonathan J, MD;  [facility];  Service:  Cardiovascular;;  Lt Iliac   PERIPHERAL VASCULAR BALLOON ANGIOPLASTY Left 04/13/2017   Procedure: PERIPHERAL VASCULAR BALLOON ANGIOPLASTY;  Surgeon: [name], MD;  [facility];  Service: Cardiovascular;  Laterality: Left;  SFA   PERIPHERAL VASCULAR INTERVENTION  12/06/2020   Procedure: PERIPHERAL VASCULAR INTERVENTION;  Surgeon: [name], MD;  [facility];  Service: Cardiovascular;;  Lt Iliac   RIGHT/LEFT HEART CATH AND CORONARY/GRAFT ANGIOGRAPHY N/A 06/28/2020   Procedure: RIGHT/LEFT HEART CATH AND CORONARY/GRAFT ANGIOGRAPHY;  Surgeon: [name], MD;  [facility];  Service: Cardiovascular;  Laterality: N/A;    Family History  Problem Relation Age of Onset   Hypertension Mother        15   Clotting disorder Father        44   Diabetes Brother        heart surgery    Allergies  Allergen Reactions   Crestor [Rosuvastatin] Other (See Comments)    Myalgias    Lipitor [Atorvastatin] Other (See Comments)    Myalgias    Zestril [Lisinopril] Cough    Current Outpatient Medications on File Prior to Visit  Medication Sig Dispense Refill   amLODipine (NORVASC) 10 MG tablet TAKE 1 TABLET BY MOUTH EVERY DAY (Patient taking differently: Take 10 mg by mouth daily.) 90 tablet 3   aspirin 81 MG tablet Take 81 mg by mouth daily.     Cholecalciferol (VITAMIN D) 50 MCG (2000 UT) tablet Take 2,000 Units by mouth daily.     clopidogrel (PLAVIX) 75 MG tablet TAKE 1 TABLET BY MOUTH EVERY DAY (Patient taking differently: Take 75 mg by mouth daily.) 90 tablet 3   Coenzyme Q10 (COQ10) 200 MG CAPS Take 200 mg by mouth daily.     doxycycline (VIBRA-TABS) 100 MG tablet Take 1 tablet (100 mg total) by mouth 2 (two) times daily. 20 tablet 0   ezetimibe (ZETIA) 10 MG tablet Take 1 tablet (10 mg total) by mouth daily. 90 tablet 3   famotidine (PEPCID) 20 MG tablet Take 1 tablet (20 mg total) by mouth daily. 90 tablet 1   fluticasone (FLONASE) 50 MCG/ACT nasal spray Place 2 sprays into both nostrils daily. 16 g 1   isosorbide mononitrate (IMDUR) 60 MG 24 hr tablet Take 1.5 tablets (90 mg total) by mouth daily. 135 tablet 3   levocetirizine (XYZAL) 5 MG tablet Take 1 tablet (5 mg total) by mouth every evening. 90 tablet 1   levofloxacin (LEVAQUIN) 750 MG tablet Take 750 mg by mouth daily. 30 day course.     meclizine (ANTIVERT) 12.5 MG tablet Take 1 tablet (12.5 mg total) by mouth 3 (three) times daily as needed for dizziness. 8 tablet 0   metFORMIN (GLUCOPHAGE) 500 MG tablet Take 1 tablet (500 mg total) by mouth 2 (two) times daily with a meal. 180 tablet 3   metoprolol succinate (TOPROL-XL) 50 MG 24 hr tablet TAKE 1 TABLET (50 MG TOTAL) BY MOUTH TWICE A DAY .TAKE WITH OR IMMEDIATELY FOLLOWING A MEAL 180 tablet 2   nitroGLYCERIN (NITROSTAT) 0.4 MG SL tablet Place 1 tablet (0.4 mg total) under the tongue every 5 (five) minutes as needed for chest pain. 25 tablet 3   Omega-3 Fatty  Acids (FISH OIL) 1200 MG CAPS Take 2 capsules (2,400 mg total) by mouth every morning.     PRALUENT 150 MG/ML SOAJ INJECT 150 MG INTO THE SKIN EVERY 14 (FOURTEEN) DAYS. 2 mL 11  No current facility-administered medications on file prior to visit.    BP 130/60   Pulse 68   Temp 98.2 F (36.8 C)   Resp 18   Ht 6\' 1"  (1.854 m)   Wt 221 lb (100.2 kg)   SpO2 99%   BMI 29.16 kg/m        Objective:   Physical Exam  General Mental Status- Alert. General Appearance- Not in acute distress.   Skin General: Color- Normal Color. Moisture- Normal Moisture.  Neck Carotid Arteries- Normal color. Moisture- Normal Moisture. No carotid bruits. No JVD.  Chest and Lung Exam Auscultation: Breath Sounds:-Normal.  Cardiovascular Auscultation:Rythm- Regular. Murmurs & Other Heart Sounds:Auscultation of the heart reveals- No Murmurs.  Abdomen Inspection:-Inspeection Normal. Palpation/Percussion:Note:No mass. Palpation and Percussion of the abdomen reveal- Non Tender, Non Distended + BS, no rebound or guarding.    Neurologic Cranial Nerve exam:- CN III-XII intact(No nystagmus), symmetric smile. Strength:- 5/5 equal and symmetric strength both upper and lower extremities.      Left lower ext- calf mild swollen and warm. Less scattere rash, not red, mild hyperpigmented, not tender, incision line from vascular surgery.       Assessment & Plan:   Patient Instructions  For possible residual and improving skin infection continue doxycycline. Your wbc level was not elevated. Your lactic acid level was elevated but your clinical presentation did not match sepsis.(Note no specific bacteria grew out on culture). Will repeat cbc and lactic acid level today.    For dry skin continue with Cera Ve moinsturizer.  Can get shingrix thru pharmacy.  Follow up date to be determined after lab review.   , PA-C

## 2022-04-28 NOTE — Patient Instructions (Addendum)
For possible residual and improving skin infection continue doxycycline. Your wbc level was not elevated. Your lactic acid level was elevated but your clinical presentation did not match sepsis.(Note no specific bacteria grew out on culture). Will repeat cbc and lactic acid level today.    For dry skin continue with Cera Ve moisturizer.  Can get shingrix thru pharmacy.  Follow up date to be determined after lab review.

## 2022-04-30 ENCOUNTER — Other Ambulatory Visit: Payer: Self-pay

## 2022-04-30 DIAGNOSIS — I739 Peripheral vascular disease, unspecified: Secondary | ICD-10-CM

## 2022-04-30 LAB — LACTIC ACID, PLASMA: LACTIC ACID: 1.2 mmol/L (ref 0.4–1.8)

## 2022-04-30 NOTE — Progress Notes (Signed)
[place], [place] ([id number]) [id number] Page 1 of 4 Visit Report for 04/25/2022 Arrival Information Details Patient Name: Date of Service: [name] [facility] Lloyd 04/25/2022 9:45 [name] M Medical Record Number: [id number] Patient Account Number: [id number] Date of Birth/Sex: Treating RN: [birth year] (81 y.o. M) Primary Care Yosmar Ryker: Mackie Pai Other Clinician: Referring Hamzah Savoca: Treating Marillyn Goren/Extender: Lamar Benes in Treatment: 65 Visit Information History Since Last Visit Added or deleted any medications: No Patient Arrived: Ambulatory Any new allergies or adverse reactions: No Arrival Time: 09:24 Had [name] fall or experienced change in No Accompanied By: wife activities of daily living that may affect Transfer Assistance: None risk of falls: Patient Identification Verified: Yes Signs or symptoms of abuse/neglect since last visito No Secondary Verification Process Completed: Yes Hospitalized since last visit: No Patient Requires Transmission-Based Precautions: No Implantable device outside of the clinic excluding No Patient Has Alerts: Yes cellular tissue based products placed in the center Patient Alerts: ABI's:02/23 R: N/C L:0.6 since last visit: TBI's:02/23 R:0.62L:0.26 Pain Present Now: No Electronic Signature(s) Signed: 04/30/2022 12:00:46 PM By: Erenest Blank Entered By: Erenest Blank on 04/25/2022 09:24:51 -------------------------------------------------------------------------------- Lower Extremity Assessment Details Patient Name: Date of Service: [name], [name] [name] 04/25/2022 9:45 [name] M Medical Record Number: [id number] Patient Account Number: [id number] Date of Birth/Sex: Treating RN: 02-Apr-1941 (81 y.o. M) Primary Care Domenique Southers: Mackie Pai Other Clinician: Referring Naria Abbey: Treating Becket Wecker/Extender: Lamar Benes in Treatment: 16 Edema Assessment Assessed: Shirlyn Goltz: No] Patrice Paradise:  No] Edema: [Left: Ye] [Right: s] Calf Left: Right: Point of Measurement: 37 cm From Medial Instep 42 cm Ankle Left: Right: Point of Measurement: 10 cm From Medial Instep 27 cm Vascular Assessment Pulses: Dorsalis Pedis Palpable: [Left:Yes] Doppler Audible: [Left:Yes] [Right:[id number] Page 2 of 4] Electronic Signature(s) Signed: 04/30/2022 12:00:46 PM By: Erenest Blank Entered By: Erenest Blank on 04/25/2022 09:32:59 -------------------------------------------------------------------------------- Multi Wound Chart Details Patient Name: Date of Service: [name], [name] [name] 04/25/2022 9:45 [name] M Medical Record Number: [id number] Patient Account Number: [id number] Date of Birth/Sex: Treating RN: Jun 21, 1940 (81 y.o. M) Primary Care Meyer Dockery: Mackie Pai Other Clinician: Referring Mckenzie Bove: Treating Kieley Akter/Extender: Lamar Benes in Treatment: 16 Vital Signs Height(in): Pulse(bpm): 65 Weight(lbs): Blood Pressure(mmHg): 128/68 Body Mass Index(BMI): Temperature(F): 97.6 Respiratory Rate(breaths/min): 18 [Treatment Notes:Wound Assessments Treatment Notes] Electronic Signature(s) Signed: 04/25/2022 12:38:41 PM By: Kalman Shan DO Entered By: Kalman Shan on 04/25/2022 11:22:06 -------------------------------------------------------------------------------- Multi-Disciplinary Care Plan Details Patient Name: Date of Service: [name], [name] [name] 04/25/2022 9:45 [name] M Medical Record Number: [id number] Patient Account Number: [id number] Date of Birth/Sex: Treating RN: [birth year] (81 y.o. [name] Primary Care Ricard Faulkner: Mackie Pai Other Clinician: Referring Torie Towle: Treating Len Azeez/Extender: Lamar Benes in Treatment: 16 Active Inactive Pain, Acute or Chronic Nursing Diagnoses: Pain, acute or chronic: actual or potential Potential alteration in comfort, pain Goals: Patient will  verbalize adequate pain control and receive pain control interventions during procedures as needed Date Initiated: 04/25/2022 Target Resolution Date: 05/21/2022 Goal Status: Active Patient/caregiver will verbalize comfort level met Date Initiated: 04/25/2022 Target Resolution Date: 05/22/2022 Goal Status: Active Interventions: Encourage patient to take pain medications as prescribed Provide education on pain McDonald, Shannon ([id number]) [id number] Page 3 of 4 Treatment Activities: Administer pain control measures as ordered : 04/25/2022 Notes: Peripheral Neuropathy Nursing Diagnoses: Knowledge deficit related to disease process and management of peripheral neurovascular dysfunction Goals: Patient/caregiver will verbalize understanding of disease process and disease management Date Initiated: 04/25/2022 Target  Resolution Date: 05/23/2022 Goal Status: Active Interventions: Assess signs and symptoms of neuropathy upon admission and as needed Provide education on Management of Neuropathy and Related Ulcers Notes: Electronic Signature(s) Signed: 04/25/2022 3:28:23 PM By: Deon Pilling RN, BSN Entered By: Deon Pilling on 04/25/2022 09:22:40 -------------------------------------------------------------------------------- Pain Assessment Details Patient Name: Date of Service: [name], [name] [name] 04/25/2022 9:45 [name] M Medical Record Number: [id number] Patient Account Number: [id number] Date of Birth/Sex: Treating RN: 16-Mar-1941 (81 y.o. M) Primary Care Zamyiah Tino: Mackie Pai Other Clinician: Referring Karlye Ihrig: Treating Seith Aikey/Extender: Lamar Benes in Treatment: 16 Active Problems Location of Pain Severity and Description of Pain Patient Has Paino No Site Locations Pain Management and Medication Current Pain Management: Electronic Signature(s) Signed: 04/30/2022 12:00:46 PM By: Erenest Blank Entered By: Erenest Blank on  04/25/2022 09:25:17 Eckels, Kayvan ([id number]) [id number] Page 4 of 4 -------------------------------------------------------------------------------- Patient/Caregiver Education Details Patient Name: Date of Service: [name] [name] 12/8/2023andnbsp9:45 [name] M Medical Record Number: [id number] Patient Account Number: [id number] Date of Birth/Gender: Treating RN: [birth year] (81 y.o. [name] Primary Care Physician: Mackie Pai Other Clinician: Referring Physician: Treating Physician/Extender: Lamar Benes in Treatment: 16 Education Assessment Education Provided To: Patient Education Topics Provided Peripheral Neuropathy: Handouts: General Foot Care Methods: Explain/Verbal Responses: Reinforcements needed Electronic Signature(s) Signed: 04/25/2022 3:28:23 PM By: Deon Pilling RN, BSN Entered By: Deon Pilling on 04/25/2022 09:23:05 -------------------------------------------------------------------------------- Vitals Details Patient Name: Date of Service: [name], [name] [name] 04/25/2022 9:45 [name] M Medical Record Number: [id number] Patient Account Number: [id number] Date of Birth/Sex: Treating RN: 1941/02/22 (81 y.o. M) Primary Care Silvano Garofano: Mackie Pai Other Clinician: Referring Kelcee Bjorn: Treating Barrie Sigmund/Extender: Lamar Benes in Treatment: 16 Vital Signs Time Taken: 09:24 Temperature (F): 97.6 Pulse (bpm): 65 Respiratory Rate (breaths/min): 18 Blood Pressure (mmHg): 128/68 Reference Range: 80 - 120 mg / dl Electronic Signature(s) Signed: 04/30/2022 12:00:46 PM By: Erenest Blank Entered By: Erenest Blank on 04/25/2022 09:25:12

## 2022-05-15 ENCOUNTER — Ambulatory Visit: Payer: Medicare HMO | Admitting: Medical

## 2022-05-20 ENCOUNTER — Ambulatory Visit: Payer: Medicare HMO | Admitting: Podiatry

## 2022-05-20 DIAGNOSIS — M79674 Pain in right toe(s): Secondary | ICD-10-CM

## 2022-05-20 DIAGNOSIS — B351 Tinea unguium: Secondary | ICD-10-CM | POA: Diagnosis not present

## 2022-05-20 DIAGNOSIS — M79675 Pain in left toe(s): Secondary | ICD-10-CM

## 2022-05-20 DIAGNOSIS — E1142 Type 2 diabetes mellitus with diabetic polyneuropathy: Secondary | ICD-10-CM

## 2022-05-20 NOTE — Progress Notes (Signed)
  Subjective:  Patient ID: [name], male    DOB: [birth year],  MRN: [id number]  No chief complaint on file.   82 y.o. male presents with the above complaint. History confirmed with patient. Patient presenting with pain related to dystrophic thickened elongated nails. Patient is unable to trim own nails related to nail dystrophy and/or mobility issues. Patient does  have a history of T2DM.    Objective:  Physical Exam: warm, good capillary refill nail exam onychomycosis of the toenails, onycholysis, and dystrophic nails DP pulses palpable, PT pulses palpable, and protective sensation absent Left Foot:  Pain with palpation of nails due to elongation and dystrophic growth.  Right Foot: Pain with palpation of nails due to elongation and dystrophic growth.   Assessment:   1. Pain due to onychomycosis of toenails of both feet   2. Diabetic polyneuropathy associated with type 2 diabetes mellitus ([place])      Plan:  Patient was evaluated and treated and all questions answered.  #Onychomycosis with pain  -Nails palliatively debrided as below. -Educated on self-care  Procedure: Nail Debridement Rationale: Pain Type of Debridement: manual, sharp debridement. Instrumentation: Nail nipper, rotary burr. Number of Nails: 10  Return in about 3 months (around 08/19/2022) for [facility].         Everitt Amber, DPM [facility]

## 2022-05-22 NOTE — Progress Notes (Signed)
[place], [place] ([id number]) [id number] Page 1 of 8 Visit Report for 01/22/2022 Arrival Information Details Patient Name: Date of Service: [name] LFO Lloyd 01/22/2022 10:00 [name] M Medical Record Number: [id number] Patient Account Number: [id number] Date of Birth/Sex: Treating RN: 13-Jan-1941 (82 y.o. [name], [name] Primary Care Paisyn Guercio: Mackie Pai Other Clinician: Referring Karnell Vanderloop: Treating Havah Ammon/Extender: Sheran Lawless in Treatment: 3 Visit Information History Since Last Visit Added or deleted any medications: No Patient Arrived: Ambulatory Any new allergies or adverse reactions: No Arrival Time: 10:00 Had [name] fall or experienced change in No Accompanied By: wife activities of daily living that may affect Transfer Assistance: None risk of falls: Patient Identification Verified: Yes Signs or symptoms of abuse/neglect since last visito No Secondary Verification Process Completed: Yes Hospitalized since last visit: No Patient Requires Transmission-Based Precautions: No Implantable device outside of the clinic excluding No Patient Has Alerts: Yes cellular tissue based products placed in the center Patient Alerts: ABI's:02/23 R: N/C L:0.6 since last visit: TBI's:02/23 R:0.62L:0.26 Has Dressing in Place as Prescribed: Yes Pain Present Now: No Electronic Signature(s) Signed: 05/21/2022 5:37:22 PM By: Rhae Hammock RN Entered By: Rhae Hammock on 01/22/2022 10:00:27 -------------------------------------------------------------------------------- Clinic Level of Care Assessment Details Patient Name: Date of Service: [name] LFO Lloyd 01/22/2022 10:00 [name] M Medical Record Number: [id number] Patient Account Number: [id number] Date of Birth/Sex: Treating RN: 1940-11-22 (81 y.o. Erie Noe Primary Care Elysa Womac: Mackie Pai Other Clinician: Referring Margo Lama: Treating Celeste Tavenner/Extender: Sheran Lawless in Treatment: 3 Clinic Level of Care Assessment Items TOOL 4 Quantity Score X- 1 0 Use when only an EandM is performed on FOLLOW-UP visit ASSESSMENTS - Nursing Assessment / Reassessment X- 1 10 Reassessment of Co-morbidities (includes updates in patient status) X- 1 5 Reassessment of Adherence to Treatment Plan ASSESSMENTS - Wound and Skin [name] ssessment / Reassessment []  - 0 Simple Wound Assessment / Reassessment - one wound X- 2 5 Complex Wound Assessment / Reassessment - multiple wounds []  - 0 Dermatologic / Skin Assessment (not related to wound area) ASSESSMENTS - Focused Assessment X- 1 5 Circumferential Edema Measurements - multi extremities []  - 0 Nutritional Assessment / Counseling / Intervention [name], [name] ([id number]) [id number] Page 2 of 8 []  - 0 Lower Extremity Assessment (monofilament, tuning fork, pulses) []  - 0 Peripheral Arterial Disease Assessment (using hand held doppler) ASSESSMENTS - Ostomy and/or Continence Assessment and Care []  - 0 Incontinence Assessment and Management []  - 0 Ostomy Care Assessment and Management (repouching, etc.) PROCESS - Coordination of Care []  - 0 Simple Patient / Family Education for ongoing care X- 1 20 Complex (extensive) Patient / Family Education for ongoing care X- 1 10 Staff obtains Programmer, systems, Records, T Results / Process Orders est []  - 0 Staff telephones HHA, Nursing Homes / Clarify orders / etc []  - 0 Routine Transfer to another Facility (non-emergent condition) []  - 0 Routine Hospital Admission (non-emergent condition) []  - 0 New Admissions / Biomedical engineer / Ordering NPWT Apligraf, etc. , []  - 0 Emergency Hospital Admission (emergent condition) X- 1 10 Simple Discharge Coordination []  - 0 Complex (extensive) Discharge Coordination PROCESS - Special Needs []  - 0 Pediatric / Minor Patient Management []  - 0 Isolation Patient Management []  - 0 Hearing /  Language / Visual special needs []  - 0 Assessment of Community assistance (transportation, D/C planning, etc.) []  - 0 Additional assistance / Altered mentation []  - 0 Support Surface(s) Assessment (bed, cushion, seat, etc.)  INTERVENTIONS - Wound Cleansing / Measurement []  - 0 Simple Wound Cleansing - one wound X- 2 5 Complex Wound Cleansing - multiple wounds X- 1 5 Wound Imaging (photographs - any number of wounds) []  - 0 Wound Tracing (instead of photographs) []  - 0 Simple Wound Measurement - one wound X- 2 5 Complex Wound Measurement - multiple wounds INTERVENTIONS - Wound Dressings []  - 0 Small Wound Dressing one or multiple wounds X- 2 15 Medium Wound Dressing one or multiple wounds []  - 0 Large Wound Dressing one or multiple wounds X- 1 5 Application of Medications - topical []  - 0 Application of Medications - injection INTERVENTIONS - Miscellaneous []  - 0 External ear exam []  - 0 Specimen Collection (cultures, biopsies, blood, body fluids, etc.) []  - 0 Specimen(s) / Culture(s) sent or taken to Lab for analysis []  - 0 Patient Transfer (multiple staff / Civil Service fast streamer / Similar devices) []  - 0 Simple Staple / Suture removal (25 or less) []  - 0 Complex Staple / Suture removal (26 or more) []  - 0 Hypo / Hyperglycemic Management (close monitor of Blood Glucose) [name], [name] ([id number]) [id number] Page 3 of 8 []  - 0 Ankle / Brachial Index (ABI) - do not check if billed separately X- 1 5 Vital Signs Has the patient been seen at the hospital within the last three years: Yes Total Score: 135 Level Of Care: New/Established - Level 4 Electronic Signature(s) Signed: 05/21/2022 5:37:22 PM By: Rhae Hammock RN Entered By: Rhae Hammock on 01/22/2022 10:34:34 -------------------------------------------------------------------------------- Encounter Discharge Information Details Patient Name: Date of Service: [name], [name] 01/22/2022  10:00 [name] M Medical Record Number: [id number] Patient Account Number: [id number] Date of Birth/Sex: Treating RN: 05/02/1941 (82 y.o. [name], [name] Primary Care Neyah Ellerman: Mackie Pai Other Clinician: Referring Zariel Capano: Treating Isella Slatten/Extender: Sheran Lawless in Treatment: 3 Encounter Discharge Information Items Discharge Condition: Stable Ambulatory Status: Ambulatory Discharge Destination: Home Transportation: Private Auto Accompanied By: self Schedule Follow-up Appointment: Yes Clinical Summary of Care: Patient Declined Electronic Signature(s) Signed: 05/21/2022 5:37:22 PM By: Rhae Hammock RN Entered By: Rhae Hammock on 01/22/2022 10:35:37 -------------------------------------------------------------------------------- Lower Extremity Assessment Details Patient Name: Date of Service: [name], [name] 01/22/2022 10:00 [name] M Medical Record Number: [id number] Patient Account Number: [id number] Date of Birth/Sex: Treating RN: [birth year] (82 y.o. Erie Noe Primary Care Sujay Grundman: Mackie Pai Other Clinician: Referring Elainna Eshleman: Treating Marykay Mccleod/Extender: Sheran Lawless in Treatment: 3 Edema Assessment Assessed: Shirlyn Goltz: Yes] Patrice Paradise: No] Edema: [Left: Ye] [Right: s] Calf Left: Right: Point of Measurement: 37 cm From Medial Instep 37 cm Ankle Left: Right: Point of Measurement: 10 cm From Medial Instep 24 cm Vascular Assessment Holzman, [place] ([id number]) [Right:[id number] Page 4 of 8] Pulses: Dorsalis Pedis Palpable: [Left:Yes] Posterior Tibial Palpable: [Left:Yes] Electronic Signature(s) Signed: 05/21/2022 5:37:22 PM By: Rhae Hammock RN Entered By: Rhae Hammock on 01/22/2022 10:08:04 -------------------------------------------------------------------------------- Multi-Disciplinary Care Plan Details Patient Name: Date of Service: [name], [name] 01/22/2022 10:00 [name]  M Medical Record Number: [id number] Patient Account Number: [id number] Date of Birth/Sex: Treating RN: 1940-10-18 (82 y.o. Erie Noe Primary Care Orlanda Frankum: Mackie Pai Other Clinician: Referring Harlowe Dowler: Treating Jude Naclerio/Extender: Sheran Lawless in Treatment: 3 Active Inactive Tissue Oxygenation Nursing Diagnoses: Actual ineffective tissue perfusion; peripheral (select once diagnosis is confirmed) Knowledge deficit related to disease process and management Potential alteration in peripheral tissue perfusion (select prior to confirmation of diagnosis) Goals: Invasive arterial studies completed  as ordered Date Initiated: 01/01/2022 Target Resolution Date: 01/25/2022 Goal Status: Active Non-invasive arterial studies are completed as ordered Date Initiated: 01/01/2022 Target Resolution Date: 01/24/2022 Goal Status: Active Patient/caregiver will verbalize understanding of disease process and disease management Date Initiated: 01/01/2022 Target Resolution Date: 01/25/2022 Goal Status: Active Interventions: Assess patient understanding of disease process and management upon diagnosis and as needed Assess peripheral arterial status upon admission and as needed Provide education on tissue oxygenation and ischemia Notes: Wound/Skin Impairment Nursing Diagnoses: Impaired tissue integrity Knowledge deficit related to ulceration/compromised skin integrity Goals: Patient will have [name] decrease in wound volume by X% from date: (specify in notes) Date Initiated: 01/01/2022 Target Resolution Date: 01/23/2022 Goal Status: Active Patient/caregiver will verbalize understanding of skin care regimen Date Initiated: 01/01/2022 Target Resolution Date: 01/24/2022 Goal Status: Active Ulcer/skin breakdown will have [name] volume reduction of 30% by week 4 Date Initiated: 01/01/2022 Target Resolution Date: 01/25/2022 Goal Status: Active Interventions: Assess patient/caregiver  ability to obtain necessary supplies [place], [place] ([id number]) [id number] Page 5 of 8 Assess patient/caregiver ability to perform ulcer/skin care regimen upon admission and as needed Assess ulceration(s) every visit Notes: Electronic Signature(s) Signed: 05/21/2022 5:37:22 PM By: [name] RN Entered By: [name] on 01/22/2022 10:32:19 -------------------------------------------------------------------------------- Pain Assessment Details Patient Name: Date of Service: [name], [name] 01/22/2022 10:00 [name] M Medical Record Number: [id number] Patient Account Number: [id number] Date of Birth/Sex: Treating RN: December 13, 1940 (82 y.o. [name] Primary Care Siaosi Alter: [name] Other Clinician: Referring Jada Kuhnert: Treating Tamella Tuccillo/Extender: [name] in Treatment: 3 Active Problems Location of Pain Severity and Description of Pain Patient Has Paino No Site Locations Pain Management and Medication Current Pain Management: Electronic Signature(s) Signed: 05/21/2022 5:37:22 PM By: [name] RN Entered By: [name] on 01/22/2022 10:03:06 -------------------------------------------------------------------------------- Patient/Caregiver Education Details Patient Name: Date of Service: [name], [name] 9/6/2023andnbsp10:00 [name] M Medical Record Number: [id number] Patient Account Number: [id number] Date of Birth/Gender: Treating RN: 19-Jun-1940 (82 y.o. [name] Primary Care Physician: [name] Other Clinician: Referring Physician: Treating Physician/Extender: Ernestina Penna, Jessie Foot in Treatment: 3 [place], [place] ([id number]) [id number] Page 6 of 8 Education Assessment Education Provided To: Patient Education Topics Provided Tissue Oxygenation: Methods: Explain/Verbal Responses: State content correctly Electronic Signature(s) Signed:  05/21/2022 5:37:22 PM By: [name] RN Entered By: [name] on 01/22/2022 10:32:59 -------------------------------------------------------------------------------- Wound Assessment Details Patient Name: Date of Service: [name], [name] 01/22/2022 10:00 [name] M Medical Record Number: [id number] Patient Account Number: [id number] Date of Birth/Sex: Treating RN: [birth year] (82 y.o. [name], [name] Primary Care Kaiel Weide: [name] Other Clinician: Referring Shepherd Finnan: Treating Nathaniel Yaden/Extender: [name] in Treatment: 3 Wound Status Wound Number: 1 Primary Arterial Insufficiency Ulcer Etiology: Wound Location: Left T Great oe Wound Open Wounding Event: Trauma Status: Date Acquired: 12/01/2021 Comorbid Coronary Artery Disease, Hypotension, Peripheral Arterial Weeks Of Treatment: 3 History: Disease, Peripheral Venous Disease, Type II Diabetes, Clustered Wound: No Osteoarthritis Photos Wound Measurements Length: (cm) 4.4 Width: (cm) 1.5 Depth: (cm) 0.2 Area: (cm) 5.184 Volume: (cm) 1.037 % Reduction in Area: 47.2% % Reduction in Volume: 47.2% Epithelialization: Small (1-33%) Tunneling: No Undermining: No Wound Description Classification: Full Thickness With Exposed Suppor Wound Margin: Distinct, outline attached Exudate Amount: Medium Exudate Type: Serosanguineous Exudate Color: red, brown t Structures Foul Odor After Cleansing: No Slough/Fibrino Yes Wound Bed Granulation Amount: Medium (34-66%) Exposed Structure Granulation Quality: Red, Pink Fascia Exposed: No Tow, [place] (  [id number]) [id number] Page 7 of 8 Necrotic Amount: Medium (34-66%) Fat Layer (Subcutaneous Tissue) Exposed: Yes Tendon Exposed: No Muscle Exposed: No Joint Exposed: No Bone Exposed: No Electronic Signature(s) Signed: 05/21/2022 5:37:22 PM By: [name] RN Entered By: [name] on 01/22/2022  10:15:46 -------------------------------------------------------------------------------- Wound Assessment Details Patient Name: Date of Service: [name], [name] 01/22/2022 10:00 [name] M Medical Record Number: [id number] Patient Account Number: [id number] Date of Birth/Sex: Treating RN: 1940-12-10 (82 y.o. [name], [name] Primary Care Fonnie Crookshanks: [name] Other Clinician: Referring Guneet Delpino: Treating Dallen Bunte/Extender: [name] in Treatment: 3 Wound Status Wound Number: 2 Primary Arterial Insufficiency Ulcer Etiology: Wound Location: Left T Second oe Wound Open Wounding Event: Trauma Status: Date Acquired: 12/01/2021 Comorbid Coronary Artery Disease, Hypotension, Peripheral Arterial Weeks Of Treatment: 3 History: Disease, Peripheral Venous Disease, Type II Diabetes, Clustered Wound: No Osteoarthritis Photos Wound Measurements Length: (cm) 0.4 Width: (cm) 0.9 Depth: (cm) 0.3 Area: (cm) 0.283 Volume: (cm) 0.085 % Reduction in Area: 28% % Reduction in Volume: -117.9% Epithelialization: None Tunneling: No Undermining: No Wound Description Classification: Full Thickness With Exposed Suppor Wound Margin: Distinct, outline attached Exudate Amount: Medium Exudate Type: Serosanguineous Exudate Color: red, brown t Structures Foul Odor After Cleansing: No Slough/Fibrino Yes Wound Bed Granulation Amount: Medium (34-66%) Exposed Structure Granulation Quality: Red, Pink Fascia Exposed: No Necrotic Amount: Medium (34-66%) Fat Layer (Subcutaneous Tissue) Exposed: Yes Tendon Exposed: No Muscle Exposed: No Joint Exposed: No Bone Exposed: No [name], [name] ([id number]) [id number] Page 8 of 8 Electronic Signature(s) Signed: 05/21/2022 5:37:22 PM By: [name] RN Entered By: [name] on 01/22/2022 10:16:10 -------------------------------------------------------------------------------- Vitals  Details Patient Name: Date of Service: [name], [name] 01/22/2022 10:00 [name] M Medical Record Number: [id number] Patient Account Number: [id number] Date of Birth/Sex: Treating RN: 1940-08-06 (82 y.o. [name] Primary Care Williard Keller: [name] Other Clinician: Referring Egbert Seidel: Treating Analiza Cowger/Extender: [name] in Treatment: 3 Vital Signs Time Taken: 10:00 Temperature (F): 97.9 Pulse (bpm): 78 Respiratory Rate (breaths/min): 17 Blood Pressure (mmHg): 119/75 Reference Range: 80 - 120 mg / dl Electronic Signature(s) Signed: 05/21/2022 5:37:22 PM By: [name] RN Entered By: [name] on 01/22/2022 10:02:20

## 2022-05-26 ENCOUNTER — Encounter (HOSPITAL_BASED_OUTPATIENT_CLINIC_OR_DEPARTMENT_OTHER): Payer: Medicare HMO | Attending: Internal Medicine | Admitting: Internal Medicine

## 2022-05-26 DIAGNOSIS — R7303 Prediabetes: Secondary | ICD-10-CM | POA: Diagnosis not present

## 2022-05-26 DIAGNOSIS — L97522 Non-pressure chronic ulcer of other part of left foot with fat layer exposed: Secondary | ICD-10-CM | POA: Diagnosis not present

## 2022-05-26 DIAGNOSIS — L97524 Non-pressure chronic ulcer of other part of left foot with necrosis of bone: Secondary | ICD-10-CM | POA: Diagnosis not present

## 2022-05-26 DIAGNOSIS — I10 Essential (primary) hypertension: Secondary | ICD-10-CM | POA: Diagnosis not present

## 2022-05-26 DIAGNOSIS — I7389 Other specified peripheral vascular diseases: Secondary | ICD-10-CM | POA: Insufficient documentation

## 2022-05-26 DIAGNOSIS — L308 Other specified dermatitis: Secondary | ICD-10-CM

## 2022-05-26 DIAGNOSIS — Z87891 Personal history of nicotine dependence: Secondary | ICD-10-CM | POA: Diagnosis not present

## 2022-05-26 NOTE — Progress Notes (Addendum)
[place], [place] ([id number]) [id number] Page 1 of 7 Visit Report for 05/26/2022 Chief Complaint Document Details Patient Name: Date of Service: [name] LFO RD 05/26/2022 8:00 A M Medical Record Number: [id number] Patient Account Number: [id number] Date of Birth/Sex: Treating RN: 1941-04-26 (82 y.o. M) Primary Care Provider: [name] Other Clinician: Referring Provider: Treating Provider/Extender: Odis Hollingshead in Treatment: 20 Information Obtained from: Patient Chief Complaint Left 1st and 2nd toe ulcers 04/25/2022; evaluation of dry patchy rash and reassurance of closed wound to the left great toe Electronic Signature(s) Signed: 05/26/2022 12:33:43 PM By: Geralyn Corwin DO Entered By: Geralyn Corwin on 05/26/2022 08:44:49 -------------------------------------------------------------------------------- HPI Details Patient Name: Date of Service: [name], A LFO RD 05/26/2022 8:00 A M Medical Record Number: [id number] Patient Account Number: [id number] Date of Birth/Sex: Treating RN: 1941-01-15 (82 y.o. M) Primary Care Provider: [name] Other Clinician: Referring Provider: Treating Provider/Extender: Odis Hollingshead in Treatment: 20 History of Present Illness HPI Description: 01-01-2022 upon evaluation today patient appears to be doing poorly in regard to her wound that is on the left great toe and second toe location. The toenail of the second toe covers over a portion of the wound which I am concerned may be hiding a piece of exposed bone based on what I see today. The patient does have a history of peripheral vascular disease he is a patient of Dr. Gery Pray who did an abdominal aortogram previous and was unable to intervene with regard to the lower extremity blood flow. The patient had a right ABI which was noncompressible and a left ABI was 0.6 with TBI's of 0.26 unfortunately. The TBI on the right was  0.62 which was not nearly as bad. Nonetheless the unfortunate thing is that with poor blood flow and potential for bone exposure this becomes increasingly complicated if the patient does have osteomyelitis to get this to heal. He does have repeat vascular testing on Monday upcoming. The patient is stated to be "prediabetic" according to notes and the patient. Currently he has been on Keflex. Patient has a history of peripheral vascular disease, prediabetes, hypertension, and unfortunately the current wound which has been present for 4 weeks based on what they tell me today. 01-08-2022 upon evaluation today patient appears to be doing about the same in regard to his wound. Again the x-ray was negative that was performed on 17th everything looks to be okay. With that being said he is continuing to have issues here with poor blood flow and perfusion he did have a repeat arterial study and ABI with TBI TBI could not even be obtained because the signals were so dampened. Previously he had been at 0.2. In regard to the ABI was previously around 0.65 he was now around 0.45. Again this was definitely a decrease on the left side compared to previous the right was stable. He is stated to have critical limb ischemia. 01-15-2022 upon evaluation today patient appears to be doing well currently in regard to his wounds all things considered. Were seeing more growth on the big toe versus the second toe but this is to be expected at the second toe does have some bone exposed. Fortunately I do not see any evidence of active infection at this time. He does have an appointment scheduled for September 11 which is Monday for an arteriogram and then subsequently the 12th for bypass surgery. Subsequently I think this is can be very good for him as far as trying  to get this healed I believe it is good to be the best plan that we have as far as getting good blood flow to allow for healing to continue. Once we get good blood flow  I think we can then clean away some of the necrotic bone if necessary to try and allow this to heal more effectively. We also discussed briefly hyperbaric oxygen therapy and the possibility of distal toe amputation if need be but again right now we are still on the plantar stages of even knowing what we can do from a wound care perspective which I think is the first initial goal here. 01-22-2022 upon evaluation today patient appears to be doing better currently in regard to his wounds all things considered. He actually is good to be having his vascular procedures upcoming next week. Nonetheless I think once we get better blood flow will be able to see where things stand. He is also going to have his MRI on Sunday which I think will also be very beneficial for us to know exactly what is going on with the second toe in particular and the way we will get a proceed following. [place] ([id number]) [id number] Page 2 of 7 02-12-2022 upon evaluation today patient appears to be doing well currently in regard to his wounds in fact he is showing signs of significant improvement which is great news. Fortunately I see no evidence of active infection locally or systemically at this time which is great news he has had his bypass on the left lower extremity and this does seem to be doing much better compared to what we were previous. He is in fact about twice a small on the great toe as it was last time I saw him I am very pleased with where things stand with the second toe as well though there is still bone exposed there seems to be excellent blood flow which I think in turn is can allow this to actually heal that is what we want. 02-19-2022 upon evaluation today patient appears to be doing excellent in regard to his wounds. He is going require some sharp debridement but to be honest I think he should really do quite well with this. He did have a lot more bleeding from the second toe  after I did clean away some of the necrotic bone last week this week I think it should bleed nearly as much which should help and even more. 02-26-2022 patient appears to be doing well currently in regard to his wounds in fact the second toe looks like it could potentially even be healed although we will get a monitor 1 more week. The great toe is significantly smaller and is healing in an excellent fashion and very pleased in that regard. 10/18; the patient's second toe is healed as far as I can tell. The great toe has 2 open areas both of which look healthy and are measuring smaller. Primary dressing on the left first toe is collagen He has 5 days worth of Levaquin apparently has been on that for 2 months. I cannot see any reason to continue this at this point.He is 4 weeks status post his revascularization 03-12-2022 upon evaluation today patient appears to be doing excellent in regard to his wound in fact this is significantly smaller compared to previous. I am extremely pleased and I do believe that we are on the right track here. I am going to go ahead and perform some debridement today just to clearway some  of the necrotic debris on the surface of the wound. He is done with the Levaquin. 03-19-2022 upon evaluation today patient appears to be doing well currently in regard to his wound on the great toe. Fortunately there does not appear to be any signs of infection which is great news and overall I am extremely pleased with where we stand today. I think that he is very close to complete resolution there is some need for sharp debridement today. 11/8; the patient is fully epithelialized today. There is not appear to be any signs of infection. He has been using Prisma Xeroform 04/25/2022 Patient presents for evaluation of patchy dry flaky skin to the lower extremities bilaterally That he developed 1 to 2 weeks ago. He has tried lotion to this area. He has seen his primary care office for this  issue and has been prescribed doxycycline. Currently there are no signs of infection including increased warmth, erythema or purulent drainage.Marland Kitchen He would also like to reassure that there is no open wound to the left great toe. He has been followed in our clinic closely for a toe wound that was healed on 11/8. This still remains healed with epithelization intact. 1/8; patient was seen 1 month ago for dry flaky skin. He states he has been using triamcinolone cream with benefit to these areas. He would like a refill. He also wants to reassure that his left great toe has no open wounds. Electronic Signature(s) Signed: 05/26/2022 12:33:43 PM By: Kalman Shan DO Entered By: Kalman Shan on 05/26/2022 08:46:32 -------------------------------------------------------------------------------- Physical Exam Details Patient Name: Date of Service: [name], A LFO RD 05/26/2022 8:00 A M Medical Record Number: [id number] Patient Account Number: [id number] Date of Birth/Sex: Treating RN: 1941/03/06 (82 y.o. M) Primary Care Provider: Mackie Pai Other Clinician: Referring Provider: Treating Provider/Extender: Lamar Benes in Treatment: 20 Constitutional respirations regular, non-labored and within target range for patient.. Cardiovascular 2+ dorsalis pedis/posterior tibialis pulses. Psychiatric pleasant and cooperative. Notes Dry, flaky patchy areas to the lower extremities. No signs of surrounding infection. T the left great toe there are no open wounds. o Electronic Signature(s) Signed: 05/26/2022 12:33:43 PM By: Kalman Shan DO Entered By: Kalman Shan on 05/26/2022 08:46:52 [place], [place] ([id number]) [id number] Page 3 of 7 -------------------------------------------------------------------------------- Physician Orders Details Patient Name: Date of Service: [name] LFO RD 05/26/2022 8:00 A M Medical Record Number:  [id number] Patient Account Number: [id number] Date of Birth/Sex: Treating RN: [birth year] (82 y.o. [place] Noe Primary Care Provider: Mackie Pai Other Clinician: Referring Provider: Treating Provider/Extender: Lamar Benes in Treatment: 20 Verbal / Phone Orders: No Diagnosis Coding Follow-up Appointments Other: - Follow up with your Primary Doctor!!:) We are prescribing you a steroid cream and sending it to your pharmacy! Patient Medications llergies: Crestor, Lipitor, lisinopril A Notifications Medication Indication Start End triamcinolone acetonide DOSE 1 - topical 0.1 % cream - Apply daily to dry, flaky skin Electronic Signature(s) Signed: 05/26/2022 12:33:43 PM By: Kalman Shan DO Previous Signature: 05/26/2022 8:43:29 AM Version By: Kalman Shan DO Entered By: Kalman Shan on 05/26/2022 08:47:02 -------------------------------------------------------------------------------- Problem List Details Patient Name: Date of Service: [name], A LFO RD 05/26/2022 8:00 A M Medical Record Number: [id number] Patient Account Number: [id number] Date of Birth/Sex: Treating RN: 10-Aug-1940 (82 y.o. M) Primary Care Provider: Mackie Pai Other Clinician: Referring Provider: Treating Provider/Extender: Lamar Benes in Treatment: 20 Active Problems ICD-10 Encounter Code Description Active Date MDM Diagnosis I73.89 Other specified  peripheral vascular diseases 01/01/2022 No Yes L97.522 Non-pressure chronic ulcer of other part of left foot with fat layer exposed 01/01/2022 No Yes L97.524 Non-pressure chronic ulcer of other part of left foot with necrosis of bone 02/12/2022 No Yes R73.03 Prediabetes 01/01/2022 No Yes [place], [place] ([id number]) [id number] Page 4 of 7 I10 Essential (primary) hypertension 01/01/2022 No Yes L30.8 Other specified dermatitis 04/25/2022 No Yes Inactive Problems Resolved  Problems Electronic Signature(s) Signed: 05/26/2022 12:33:43 PM By: Geralyn CorwinHoffman, Cabe Lashley DO Entered By: Geralyn CorwinHoffman, Maridel Pixler on 05/26/2022 08:44:39 -------------------------------------------------------------------------------- Progress Note Details Patient Name: Date of Service: Richard BaldyWO O D, A LFO RD 05/26/2022 8:00 A M Medical Record Number: [id number] Patient Account Number: [id number] Date of Birth/Sex: Treating RN: 11/02/1940 (82 y.o. M) Primary Care Provider: Esperanza RichtersSaguier, Edward Other Clinician: Referring Provider: Treating Provider/Extender: Odis HollingsheadHoffman, Ardis [place]wood [place], Edward Weeks in Treatment: 20 Subjective Chief Complaint Information obtained from Patient Left 1st and 2nd toe ulcers 04/25/2022; evaluation of dry patchy rash and reassurance of closed wound to the left great toe History of Present Illness (HPI) 01-01-2022 upon evaluation today patient appears to be doing poorly in regard to her wound that is on the left great toe and second toe location. The toenail of the second toe covers over a portion of the wound which I am concerned may be hiding a piece of exposed bone based on what I see today. The patient does have a history of peripheral vascular disease he is a patient of Dr. Gery PrayBarry who did an abdominal aortogram previous and was unable to intervene with regard to the lower extremity blood flow. The patient had a right ABI which was noncompressible and a left ABI was 0.6 with TBI's of 0.26 unfortunately. The TBI on the right was 0.62 which was not nearly as bad. Nonetheless the unfortunate thing is that with poor blood flow and potential for bone exposure this becomes increasingly complicated if the patient does have osteomyelitis to get this to heal. He does have repeat vascular testing on Monday upcoming. The patient is stated to be "prediabetic" according to notes and the patient. Currently he has been on Keflex. Patient has a history of peripheral vascular disease, prediabetes, hypertension,  and unfortunately the current wound which has been present for 4 weeks based on what they tell me today. 01-08-2022 upon evaluation today patient appears to be doing about the same in regard to his wound. Again the x-ray was negative that was performed on 17th everything looks to be okay. With that being said he is continuing to have issues here with poor blood flow and perfusion he did have a repeat arterial study and ABI with TBI TBI could not even be obtained because the signals were so dampened. Previously he had been at 0.2. In regard to the ABI was previously around 0.65 he was now around 0.45. Again this was definitely a decrease on the left side compared to previous the right was stable. He is stated to have critical limb ischemia. 01-15-2022 upon evaluation today patient appears to be doing well currently in regard to his wounds all things considered. Were seeing more growth on the big toe versus the second toe but this is to be expected at the second toe does have some bone exposed. Fortunately I do not see any evidence of active infection at this time. He does have an appointment scheduled for September 11 which is Monday for an arteriogram and then subsequently the 12th for bypass surgery. Subsequently I think this is can be very good  for him as far as trying to get this healed I believe it is good to be the best plan that we have as far as getting good blood flow to allow for healing to continue. Once we get good blood flow I think we can then clean away some of the necrotic bone if necessary to try and allow this to heal more effectively. We also discussed briefly hyperbaric oxygen therapy and the possibility of distal toe amputation if need be but again right now we are still on the plantar stages of even knowing what we can do from a wound care perspective which I think is the first initial goal here. 01-22-2022 upon evaluation today patient appears to be doing better currently in regard  to his wounds all things considered. He actually is good to be having his vascular procedures upcoming next week. Nonetheless I think once we get better blood flow will be able to see where things stand. He is also going to have his MRI on Sunday which I think will also be very beneficial for [place] to know exactly what is going on with the second toe in particular and the way we will get a proceed following. 02-12-2022 upon evaluation today patient appears to be doing well currently in regard to his wounds in fact he is showing signs of significant improvement which is great news. Fortunately I see no evidence of active infection locally or systemically at this time which is great news he has had his bypass on the left lower extremity and this does seem to be doing much better compared to what we were previous. He is in fact about twice a small on the great toe as it was last time I saw him I am very pleased with where things stand with the second toe as well though there is still bone exposed there seems to be excellent blood flow which I think in turn is can allow this to actually heal that is what we want. 02-19-2022 upon evaluation today patient appears to be doing excellent in regard to his wounds. He is going require some sharp debridement but to be honest I think he should really do quite well with this. He did have a lot more bleeding from the second toe after I did clean away some of the necrotic bone last week this week I think it should bleed nearly as much which should help and even more. [place], [place] ([id number]) [id number] Page 5 of 7 02-26-2022 patient appears to be doing well currently in regard to his wounds in fact the second toe looks like it could potentially even be healed although we will get a monitor 1 more week. The great toe is significantly smaller and is healing in an excellent fashion and very pleased in that regard. 10/18; the patient's second toe  is healed as far as I can tell. The great toe has 2 open areas both of which look healthy and are measuring smaller. Primary dressing on the left first toe is collagen He has 5 days worth of Levaquin apparently has been on that for 2 months. I cannot see any reason to continue this at this point.He is 4 weeks status post his revascularization 03-12-2022 upon evaluation today patient appears to be doing excellent in regard to his wound in fact this is significantly smaller compared to previous. I am extremely pleased and I do believe that we are on the right track here. I am going to go ahead and perform some  debridement today just to clearway some of the necrotic debris on the surface of the wound. He is done with the Levaquin. 03-19-2022 upon evaluation today patient appears to be doing well currently in regard to his wound on the great toe. Fortunately there does not appear to be any signs of infection which is great news and overall I am extremely pleased with where we stand today. I think that he is very close to complete resolution there is some need for sharp debridement today. 11/8; the patient is fully epithelialized today. There is not appear to be any signs of infection. He has been using Prisma Xeroform 04/25/2022 Patient presents for evaluation of patchy dry flaky skin to the lower extremities bilaterally That he developed 1 to 2 weeks ago. He has tried lotion to this area. He has seen his primary care office for this issue and has been prescribed doxycycline. Currently there are no signs of infection including increased warmth, erythema or purulent drainage.Marland Kitchen He would also like to reassure that there is no open wound to the left great toe. He has been followed in our clinic closely for a toe wound that was healed on 11/8. This still remains healed with epithelization intact. 1/8; patient was seen 1 month ago for dry flaky skin. He states he has been using triamcinolone cream with benefit  to these areas. He would like a refill. He also wants to reassure that his left great toe has no open wounds. Patient History Information obtained from Patient, Chart. Family History Unknown History. Social History Former smoker, Marital Status - Married, Alcohol Use - Rarely, Drug Use - No History, Caffeine Use - Rarely. Medical History Cardiovascular Patient has history of Coronary Artery Disease, Hypotension, Peripheral Arterial Disease, Peripheral Venous Disease Denies history of Angina, Arrhythmia, Congestive Heart Failure, Deep Vein Thrombosis, Myocardial Infarction, Phlebitis, Vasculitis Endocrine Patient has history of Type II Diabetes Integumentary (Skin) Denies history of History of Burn Musculoskeletal Patient has history of Osteoarthritis Medical A Surgical History Notes nd Cardiovascular hyperilipidemia, claudication, LEFT ABI'S ABNORMAL Objective Constitutional respirations regular, non-labored and within target range for patient.. Vitals Time Taken: 8:00 AM, Temperature: 97.7 F, Pulse: 68 bpm, Respiratory Rate: 20 breaths/min, Blood Pressure: 119/66 mmHg. Cardiovascular 2+ dorsalis pedis/posterior tibialis pulses. Psychiatric pleasant and cooperative. General Notes: Dry, flaky patchy areas to the lower extremities. No signs of surrounding infection. T the left great toe there are no open wounds. o Assessment Active Problems ICD-10 Other specified peripheral vascular diseases Non-pressure chronic ulcer of other part of left foot with fat layer exposed [place], Marisol ([id number]) [id number] Page 6 of 7 Non-pressure chronic ulcer of other part of left foot with necrosis of bone Prediabetes Essential (primary) hypertension Other specified dermatitis Patient and continues to have dry flaky patchy areas on his lower extremities consistent with eczema. II recommended continuing the triamcinolone cream. He has not followed up with his  primary care physician about this issue. I recommended if the rash does not resolve that he follow-up with his PCP. He has no open wounds to his left great toe. Plan Follow-up Appointments: Other: - Follow up with your Primary Doctor!!:) We are prescribing you a steroid cream and sending it to your pharmacy! The following medication(s) was prescribed: triamcinolone acetonide topical 0.1 % cream 1 Apply daily to dry, flaky skin 1. TCA 2. Follow up with PCP 3. Follow up as needed for wound care Electronic Signature(s) Signed: 05/26/2022 12:33:43 PM By: Geralyn Corwin DO Entered By: Geralyn Corwin on 05/26/2022  08:50:29 -------------------------------------------------------------------------------- HxROS Details Patient Name: Date of Service: [name] LFO RD 05/26/2022 8:00 A M Medical Record Number: [id number] Patient Account Number: [id number] Date of Birth/Sex: Treating RN: 1940/07/20 (82 y.o. M) Primary Care Provider: [name] Other Clinician: Referring Provider: Treating Provider/Extender: Odis Hollingshead in Treatment: 20 Information Obtained From Patient Chart Cardiovascular Medical History: Positive for: Coronary Artery Disease; Hypotension; Peripheral Arterial Disease; Peripheral Venous Disease Negative for: Angina; Arrhythmia; Congestive Heart Failure; Deep Vein Thrombosis; Myocardial Infarction; Phlebitis; Vasculitis Past Medical History Notes: hyperilipidemia, claudication, LEFT ABI'S ABNORMAL Endocrine Medical History: Positive for: Type II Diabetes Blood sugar tested every day: Yes Tested : Integumentary (Skin) Medical History: Negative for: History of Burn Musculoskeletal Medical History: Positive for: Osteoarthritis [place], [place] ([id number]) [id number] Page 7 of 7 Immunizations Pneumococcal Vaccine: Received Pneumococcal Vaccination: Yes Received Pneumococcal Vaccination On or After 60th Birthday:  Yes Implantable Devices No devices added Family and Social History Unknown History: Yes; Former smoker; Marital Status - Married; Alcohol Use: Rarely; Drug Use: No History; Caffeine Use: Rarely; Financial Concerns: No; Food, Clothing or Shelter Needs: No; Support System Lacking: No; Transportation Concerns: No Electronic Signature(s) Signed: 05/26/2022 12:33:43 PM By: Geralyn Corwin DO Entered By: Geralyn Corwin on 05/26/2022 08:46:36 -------------------------------------------------------------------------------- SuperBill Details Patient Name: Date of Service: [name], A LFO RD 05/26/2022 Medical Record Number: [id number] Patient Account Number: [id number] Date of Birth/Sex: Treating RN: [birth year] (82 y.o. 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

## 2022-05-27 NOTE — Progress Notes (Addendum)
[place], [place] ([id number]) [id number] Page 1 of 6 Visit Report for 05/26/2022 Arrival Information Details Patient Name: Date of Service: [name] LFO RD 05/26/2022 8:00 A M Medical Record Number: [id number] Patient Account Number: [id number] Date of Birth/Sex: Treating RN: [birth year] (82 y.o. [name] Primary Care Fryda Molenda: Mackie Pai Other Clinician: Referring Lysha Schrade: Treating Caleah Tortorelli/Extender: Lamar Benes in Treatment: 23 Visit Information History Since Last Visit Added or deleted any medications: No Patient Arrived: Ambulatory Any new allergies or adverse reactions: No Arrival Time: 08:00 Had a fall or experienced change in No Accompanied By: family member activities of daily living that may affect Transfer Assistance: None risk of falls: Patient Requires Transmission-Based Precautions: No Signs or symptoms of abuse/neglect since last visito No Patient Has Alerts: Yes Hospitalized since last visit: No Patient Alerts: ABI's:02/23 R: N/C L:0.6 Implantable device outside of the clinic excluding No TBI's:02/23 R:0.62L:0.26 cellular tissue based products placed in the center since last visit: Pain Present Now: No Notes Per patient here for a toe closed area check and for the Sarha Bartelt to treat a rash on trunk area. Electronic Signature(s) Signed: 05/26/2022 6:29:44 PM By: Deon Pilling RN, BSN Entered By: Deon Pilling on 05/26/2022 08:01:18 -------------------------------------------------------------------------------- Clinic Level of Care Assessment Details Patient Name: Date of Service: [name] LFO RD 05/26/2022 8:00 A M Medical Record Number: [id number] Patient Account Number: [id number] Date of Birth/Sex: Treating RN: 10/09/1940 (82 y.o. [name] Primary Care Declyn Delsol: Mackie Pai Other Clinician: Referring Laina Guerrieri: Treating Macon Sandiford/Extender: Lamar Benes in  Treatment: 20 Clinic Level of Care Assessment Items TOOL 4 Quantity Score X- 1 0 Use when only an EandM is performed on FOLLOW-UP visit ASSESSMENTS - Nursing Assessment / Reassessment X- 1 10 Reassessment of Co-morbidities (includes updates in patient status) X- 1 5 Reassessment of Adherence to Treatment Plan ASSESSMENTS - Wound and Skin A ssessment / Reassessment X - Simple Wound Assessment / Reassessment - one wound 1 5 []$  - 0 Complex Wound Assessment / Reassessment - multiple wounds []$  - 0 Dermatologic / Skin Assessment (not related to wound area) ASSESSMENTS - Focused Assessment []$  - 0 Circumferential Edema Measurements - multi extremities [name] ([id number]) [id number] Page 2 of 6 []$  - 0 Nutritional Assessment / Counseling / Intervention []$  - 0 Lower Extremity Assessment (monofilament, tuning fork, pulses) []$  - 0 Peripheral Arterial Disease Assessment (using hand held doppler) ASSESSMENTS - Ostomy and/or Continence Assessment and Care []$  - 0 Incontinence Assessment and Management []$  - 0 Ostomy Care Assessment and Management (repouching, etc.) PROCESS - Coordination of Care X - Simple Patient / Family Education for ongoing care 1 15 []$  - 0 Complex (extensive) Patient / Family Education for ongoing care X- 1 10 Staff obtains Programmer, systems, Records, T Results / Process Orders est []$  - 0 Staff telephones HHA, Nursing Homes / Clarify orders / etc []$  - 0 Routine Transfer to another Facility (non-emergent condition) []$  - 0 Routine Hospital Admission (non-emergent condition) []$  - 0 New Admissions / Biomedical engineer / Ordering NPWT Apligraf, etc. , []$  - 0 Emergency Hospital Admission (emergent condition) X- 1 10 Simple Discharge Coordination []$  - 0 Complex (extensive) Discharge Coordination PROCESS - Special Needs []$  - 0 Pediatric / Minor Patient Management []$  - 0 Isolation Patient Management []$  - 0 Hearing / Language / Visual  special needs []$  - 0 Assessment of Community assistance (transportation, D/C planning, etc.) []$  - 0 Additional assistance / Altered mentation []$  - 0  Support Surface(s) Assessment (bed, cushion, seat, etc.) INTERVENTIONS - Wound Cleansing / Measurement []$  - 0 Simple Wound Cleansing - one wound []$  - 0 Complex Wound Cleansing - multiple wounds []$  - 0 Wound Imaging (photographs - any number of wounds) []$  - 0 Wound Tracing (instead of photographs) []$  - 0 Simple Wound Measurement - one wound []$  - 0 Complex Wound Measurement - multiple wounds INTERVENTIONS - Wound Dressings []$  - 0 Small Wound Dressing one or multiple wounds []$  - 0 Medium Wound Dressing one or multiple wounds []$  - 0 Large Wound Dressing one or multiple wounds []$  - 0 Application of Medications - topical []$  - 0 Application of Medications - injection INTERVENTIONS - Miscellaneous []$  - 0 External ear exam []$  - 0 Specimen Collection (cultures, biopsies, blood, body fluids, etc.) []$  - 0 Specimen(s) / Culture(s) sent or taken to Lab for analysis []$  - 0 Patient Transfer (multiple staff / Civil Service fast streamer / Similar devices) []$  - 0 Simple Staple / Suture removal (25 or less) []$  - 0 Complex Staple / Suture removal (26 or more) [place], [place] ([id number]) [id number] Page 3 of 6 []$  - 0 Hypo / Hyperglycemic Management (close monitor of Blood Glucose) []$  - 0 Ankle / Brachial Index (ABI) - do not check if billed separately X- 1 5 Vital Signs Has the patient been seen at the hospital within the last three years: Yes Total Score: 60 Level Of Care: New/Established - Level 2 Electronic Signature(s) Signed: 05/26/2022 4:22:55 PM By: Rhae Hammock RN Entered By: Rhae Hammock on 05/26/2022 08:44:41 -------------------------------------------------------------------------------- Encounter Discharge Information Details Patient Name: Date of Service: Tilman Lloyd D, A LFO RD 05/26/2022 8:00 A M Medical  Record Number: [id number] Patient Account Number: [id number] Date of Birth/Sex: Treating RN: April 17, 1941 (83 y.o. [name], [name] Primary Care Collie Kittel: Mackie Pai Other Clinician: Referring Ganon Demasi: Treating Brexton Sofia/Extender: Lamar Benes in Treatment: 20 Encounter Discharge Information Items Discharge Condition: Stable Ambulatory Status: Ambulatory Discharge Destination: Home Transportation: Private Auto Accompanied By: self Schedule Follow-up Appointment: Yes Clinical Summary of Care: Patient Declined Electronic Signature(s) Signed: 05/26/2022 4:22:55 PM By: Rhae Hammock RN Entered By: Rhae Hammock on 05/26/2022 08:45:34 -------------------------------------------------------------------------------- Lower Extremity Assessment Details Patient Name: Date of Service: [name], A LFO RD 05/26/2022 8:00 A M Medical Record Number: [id number] Patient Account Number: [id number] Date of Birth/Sex: Treating RN: 01-03-1941 (82 y.o. [name] Primary Care Enrique Weiss: Mackie Pai Other Clinician: Referring Nalla Purdy: Treating Peightyn Roberson/Extender: Lamar Benes in Treatment: 20 Electronic Signature(s) Signed: 05/26/2022 6:29:44 PM By: Deon Pilling RN, BSN Entered By: Deon Pilling on 05/26/2022 08:01:58 Multi Wound Chart Details -------------------------------------------------------------------------------- [name] ([id number]) [id number] Page 4 of 6 Patient Name: Date of Service: [name] LFO RD 05/26/2022 8:00 A M Medical Record Number: [id number] Patient Account Number: [id number] Date of Birth/Sex: Treating RN: [birth year] (82 y.o. M) Primary Care Nial Hawe: Mackie Pai Other Clinician: Referring Monasia Lair: Treating Cervando Durnin/Extender: Lamar Benes in Treatment: 20 Vital Signs Height(in): Pulse(bpm): 68 Weight(lbs): Blood Pressure(mmHg):  119/66 Body Mass Index(BMI): Temperature(F): 97.7 Respiratory Rate(breaths/min): 20 Wound Assessments Treatment Notes Electronic Signature(s) Signed: 05/26/2022 12:33:43 PM By: Kalman Shan DO Entered By: Kalman Shan on 05/26/2022 08:44:42 -------------------------------------------------------------------------------- Multi-Disciplinary Care Plan Details Patient Name: Date of Service: [name], A LFO RD 05/26/2022 8:00 A M Medical Record Number: [id number] Patient Account Number: [id number] Date of Birth/Sex: Treating RN: 19-Feb-1941 (82 y.o. [name] Primary Care Catlin Aycock: Mackie Pai Other Clinician: Referring Lorelai Huyser: Treating  Corliss Lamartina/Extender: Lamar Benes in Treatment: 20 Active Inactive Electronic Signature(s) Signed: 06/06/2022 9:04:50 PM By: Deon Pilling RN, BSN Signed: 07/07/2022 10:40:58 AM By: Rhae Hammock RN Previous Signature: 05/26/2022 4:22:55 PM Version By: Rhae Hammock RN Entered By: Deon Pilling on 06/06/2022 21:04:50 -------------------------------------------------------------------------------- Pain Assessment Details Patient Name: Date of Service: [name], A LFO RD 05/26/2022 8:00 A M Medical Record Number: [id number] Patient Account Number: [id number] Date of Birth/Sex: Treating RN: Oct 10, 1940 (82 y.o. [name] Primary Care Nasser Ku: Mackie Pai Other Clinician: Referring Mylene Bow: Treating Omolara Carol/Extender: Lamar Benes in Treatment: 20 Active Problems Location of Pain Severity and Description of Pain Patient Has Paino No Site Locations Rate the pain. [place], [place] ([id number]) [id number] Page 5 of 6 Rate the pain. Current Pain Level: 0 Pain Management and Medication Current Pain Management: Medication: No Cold Application: No Rest: No Massage: No Activity: No T.E.N.S.: No Heat Application: No Leg drop or elevation: No Is  the Current Pain Management Adequate: Adequate How does your wound impact your activities of daily livingo Sleep: No Bathing: No Appetite: No Relationship With Others: No Bladder Continence: No Emotions: No Bowel Continence: No Work: No Toileting: No Drive: No Dressing: No Hobbies: No Engineer, maintenance) Signed: 05/26/2022 6:29:44 PM By: Deon Pilling RN, BSN Entered By: Deon Pilling on 05/26/2022 08:01:53 -------------------------------------------------------------------------------- Patient/Caregiver Education Details Patient Name: Date of Service: [name], A LFO RD 1/8/2024andnbsp8:00 A M Medical Record Number: [id number] Patient Account Number: [id number] Date of Birth/Gender: Treating RN: 04-08-1941 (82 y.o. [name] Primary Care Physician: Mackie Pai Other Clinician: Referring Physician: Treating Physician/Extender: Lamar Benes in Treatment: 20 Education Assessment Education Provided To: Patient Education Topics Provided Wound/Skin Impairment: Methods: Explain/Verbal Responses: Reinforcements needed, State content correctly [employer]) Signed: 05/26/2022 4:22:55 PM By: Rhae Hammock RN Entered By: Rhae Hammock on 05/26/2022 08:13:26 [place], [place] ([id number]) [id number] Page 6 of 6 -------------------------------------------------------------------------------- Vitals Details Patient Name: Date of Service: [name] LFO RD 05/26/2022 8:00 A M Medical Record Number: [id number] Patient Account Number: [id number] Date of Birth/Sex: Treating RN: 1940/09/18 (82 y.o. [name] Primary Care Layla Kesling: Mackie Pai Other Clinician: Referring Lysette Lindenbaum: Treating Treven Holtman/Extender: Lamar Benes in Treatment: 20 Vital Signs Time Taken: 08:00 Temperature (F): 97.7 Pulse (bpm): 68 Respiratory Rate (breaths/min): 20 Blood Pressure (mmHg):  119/66 Reference Range: 80 - 120 mg / dl Electronic Signature(s) Signed: 05/26/2022 6:29:44 PM By: Deon Pilling RN, BSN Entered By: Deon Pilling on 05/26/2022 08:01:35

## 2022-07-10 ENCOUNTER — Other Ambulatory Visit (HOSPITAL_COMMUNITY): Payer: Self-pay

## 2022-07-10 ENCOUNTER — Ambulatory Visit: Payer: Medicare HMO | Attending: Physician Assistant | Admitting: Physician Assistant

## 2022-07-10 ENCOUNTER — Telehealth: Payer: Self-pay

## 2022-07-10 ENCOUNTER — Telehealth: Payer: Self-pay | Admitting: Pharmacist

## 2022-07-10 ENCOUNTER — Encounter: Payer: Self-pay | Admitting: Physician Assistant

## 2022-07-10 VITALS — BP 112/60 | HR 65 | Ht 73.0 in | Wt 224.0 lb

## 2022-07-10 DIAGNOSIS — I10 Essential (primary) hypertension: Secondary | ICD-10-CM | POA: Diagnosis not present

## 2022-07-10 DIAGNOSIS — E785 Hyperlipidemia, unspecified: Secondary | ICD-10-CM | POA: Diagnosis not present

## 2022-07-10 DIAGNOSIS — I25810 Atherosclerosis of coronary artery bypass graft(s) without angina pectoris: Secondary | ICD-10-CM

## 2022-07-10 DIAGNOSIS — I739 Peripheral vascular disease, unspecified: Secondary | ICD-10-CM

## 2022-07-10 NOTE — Telephone Encounter (Signed)
Pharmacy Patient Advocate Encounter   Received notification from [place] that prior authorization for PRALUENT 150 MG/ML is needed.    PA submitted on 07/10/22 Key [id number] Status is pending  Karie Soda, [place] Patient Advocate Specialist Direct Number: [phone] Fax: [phone]

## 2022-07-10 NOTE — Patient Instructions (Signed)
Medication Instructions:   Your physician recommends that you continue on your current medications as directed. Please refer to the Current Medication list given to you today.  *If you need a refill on your cardiac medications before your next appointment, please call your pharmacy*  Lab Work: Your physician recommends that you return for lab work 1-2 days prior to follow up with Dr. Gwenlyn Found:  Fasting Lipid Panel-DO NOT eat or drink past midnight. Okay to have water and/or black coffee to drink the morning of. Hepatic (Liver) Function Test   If you have labs (blood work) drawn today and your tests are completely normal, you will receive your results only by: MyChart Message (if you have MyChart) OR A paper copy in the mail If you have any lab test that is abnormal or we need to change your treatment, we will call you to review the results.  Testing/Procedures: NONE ordered at this time of appointment   Follow-Up: At [facility], you and your health needs are our priority.  As part of our continuing mission to provide you with exceptional heart care, we have created designated Provider Care Teams.  These Care Teams include your primary Cardiologist (physician) and Advanced Practice Providers (APPs -  Physician Assistants and Nurse Practitioners) who all work together to provide you with the care you need, when you need it.  Your next appointment:   4-6 month(s)  Provider:   Quay Burow, MD     Other Instructions

## 2022-07-10 NOTE — Progress Notes (Signed)
Cardiology Office Note:    Date:  07/11/2022   ID:  [name], DOB [birth year], MRN [id number]  PCP:  [place], Edward, [place] Providers Cardiologist:  Quay Burow, MD     Referring MD: Elise Benne   Chief Complaint  Patient presents with   Follow-up    3 months.    History of Present Illness:    [name] is a 82 y.o. male with a hx of CAD s/p CABG 12/27/2004 with LIMA to LAD, SVG to left circumflex marginal branch, SVG to PDA, HTN, HLD, PAD s/p recent femoral-popliteal bypass and left femoral enterectomy on 01/28/2022.  Myoview in February 2021 was negative.  Last cardiac catheterization in February 2022 revealed normal EF, left dominant system, patent SVG to distal left PDA, patent LIMA to LAD, 90% apical LAD stenosis after LIMA insertion.  He underwent vascular bypass surgery by Dr. Donzetta Matters on 01/28/2022.  Patient returned back to the hospital 5 days after being released from admission of vascular bypass surgery with chest discomfort.  Troponin was mildly elevated.  Patient underwent nuclear stress test on 02/05/2022 which came back intermediate risk with ischemia again relatively small patch of basal anterior wall and anterior septum, Dr. Sallyanne Kuster suspected this is due to left main coronary artery stenosis feeding a relatively short segment of LAD artery was attached to septals and diagonal with downstream occlusion of the mid LAD.  Area of myocardium was relatively small and risk outweighed the potential benefit to pursue cardiac catheterization.  Echocardiogram showed EF 50-55 % with no regional wall motion abnormality, normal RV, no significant valve disease.  Medical management was recommended.  Beta-blocker was increased.  Hemoglobin was 9.0 at the time of discharge.   I last saw the patient in September 2023, he did have some transient chest discomfort after leaving the hospital.  I also reviewed his previous CT of the chest which showed central lobar  and emphysematous changes.  He was last seen by Dr. Gwenlyn Found in November, Imdur was uptitrated to 90 mg daily.  Patient presents today for follow-up.  Since last medication adjustment, his chest pain has largely resolved.  He is able to do every day activity.  He does have right lower extremity claudication symptoms after walking long distance but does not interfere with his everyday activity.  He has not been taking Praluent since November due to cost, he was previously enrolled in a [employer] program.  LDL in November 2023 has increased from previous 20 up to 83.  I discussed this with our clinical pharmacist who will contact the patient and arrange enrolled him in the [employer].  Patient can have a fasting lipid panel in 4 to 69-monthprior to follow-up with Dr. BGwenlyn Found   Past Medical History:  Diagnosis Date   Claudication ([place] 06/27/2011   Left ABIs abnormal values with mild-moderate arterial insufficiency, right SFA moderate amount of mixed density plaque elevating velocities suggestive 50-69% diameter reduction   Coronary artery disease 06/27/2011   Normal Lexiscan, EF 64, post-stress EF 64, no EKG changes   Diabetes mellitus without complication ([place]    prediabetic   Hyperlipidemia    Hypertension    Impotence of organic origin    Osteoarthritis    Other testicular hypofunction    PVD (peripheral vascular disease) ([place]     Past Surgical History:  Procedure Laterality Date   ABDOMINAL AORTOGRAM N/A 06/28/2020   Procedure: ABDOMINAL AORTOGRAM;  Surgeon: BGwenlyn Found  Pearletha Forge, MD;  Location: [facility];  Service: Cardiovascular;  Laterality: N/A;   ABDOMINAL AORTOGRAM W/LOWER EXTREMITY Left 01/27/2022   Procedure: ABDOMINAL AORTOGRAM W/LOWER EXTREMITY;  Surgeon: Waynetta Sandy, MD;  Location: [place] [facility];  Service: Cardiovascular;  Laterality: Left;   CARDIAC CATHETERIZATION  12/26/2004   Left main-90% distal tapered stenosis, left circumflex-95%  ostial stenosis, first marginal branch 99% ostial branch, posterior descending artery 99% ostial stenosis, 60-70% segmental mid stenosis, needed CABG   CORONARY ARTERY BYPASS GRAFT  12/27/2004   Left main 3-vessel disease, LIMA to LAD, saphenous vein to circumflexmarginal branch and to the PDA   ENDARTERECTOMY FEMORAL Left 01/28/2022   Procedure: LEFT ENDARTERECTOMYCOMMON  [place];  Surgeon: Waynetta Sandy, MD;  Location: [place];  Service: Vascular;  Laterality: Left;   FEMORAL-POPLITEAL BYPASS GRAFT Left 01/28/2022   Procedure: LEFT FEMORAL-POPLITEAL ARTERY BYPASS;  Surgeon: Waynetta Sandy, MD;  Location: [place];  Service: Vascular;  Laterality: Left;   INGUINAL HERNIA REPAIR Left 2005   LOWER EXTREMITY ANGIOGRAPHY N/A 04/13/2017   Procedure: LOWER EXTREMITY ANGIOGRAPHY;  Surgeon: Lorretta Harp, MD;  Location: [place] [facility];  Service: Cardiovascular;  Laterality: N/A;   LOWER EXTREMITY ANGIOGRAPHY Bilateral 12/06/2020   Procedure: Lower Extremity Angiography;  Surgeon: Lorretta Harp, MD;  Location: [place] [facility];  Service: Cardiovascular;  Laterality: Bilateral;  Limited Study   LOWER EXTREMITY INTERVENTION N/A 08/04/2016   Procedure: Lower Extremity Intervention;  Surgeon: Lorretta Harp, MD;  Location: [place] [facility];  Service: Cardiovascular;  Laterality: N/A;   PERIPHERAL VASCULAR ATHERECTOMY Left 04/13/2017   Procedure: PERIPHERAL VASCULAR ATHERECTOMY;  Surgeon: Lorretta Harp, MD;  Location: [place] [facility];  Service: Cardiovascular;  Laterality: Left;  SFA   PERIPHERAL VASCULAR ATHERECTOMY  12/06/2020   Procedure: PERIPHERAL VASCULAR ATHERECTOMY;  Surgeon: Lorretta Harp, MD;  Location: [place] [facility];  Service: Cardiovascular;;  Lt Iliac   PERIPHERAL VASCULAR BALLOON ANGIOPLASTY Left 04/13/2017   Procedure: PERIPHERAL VASCULAR BALLOON ANGIOPLASTY;  Surgeon: Lorretta Harp, MD;  Location: [place] [facility];   Service: Cardiovascular;  Laterality: Left;  SFA   PERIPHERAL VASCULAR INTERVENTION  12/06/2020   Procedure: PERIPHERAL VASCULAR INTERVENTION;  Surgeon: Lorretta Harp, MD;  Location: [facility];  Service: Cardiovascular;;  Lt Iliac   RIGHT/LEFT HEART CATH AND CORONARY/GRAFT ANGIOGRAPHY N/A 06/28/2020   Procedure: RIGHT/LEFT HEART CATH AND CORONARY/GRAFT ANGIOGRAPHY;  Surgeon: Lorretta Harp, MD;  Location: [facility];  Service: Cardiovascular;  Laterality: N/A;    Current Medications: Current Meds  Medication Sig   amLODipine (NORVASC) 10 MG tablet TAKE 1 TABLET BY MOUTH EVERY DAY (Patient taking differently: Take 10 mg by mouth daily.)   aspirin 81 MG tablet Take 81 mg by mouth daily.   Cholecalciferol (VITAMIN D) 50 MCG (2000 UT) tablet Take 2,000 Units by mouth daily.   clopidogrel (PLAVIX) 75 MG tablet TAKE 1 TABLET BY MOUTH EVERY DAY (Patient taking differently: Take 75 mg by mouth daily.)   Coenzyme Q10 (COQ10) 200 MG CAPS Take 200 mg by mouth daily.   doxycycline (VIBRA-TABS) 100 MG tablet Take 1 tablet (100 mg total) by mouth 2 (two) times daily.   ezetimibe (ZETIA) 10 MG tablet Take 1 tablet (10 mg total) by mouth daily.   famotidine (PEPCID) 20 MG tablet Take 1 tablet (20 mg total) by mouth daily.   isosorbide mononitrate (IMDUR) 60 MG 24  hr tablet Take 1.5 tablets (90 mg total) by mouth daily.   levocetirizine (XYZAL) 5 MG tablet Take 1 tablet (5 mg total) by mouth every evening.   levofloxacin (LEVAQUIN) 750 MG tablet Take 750 mg by mouth daily. 30 day course.   meclizine (ANTIVERT) 12.5 MG tablet Take 1 tablet (12.5 mg total) by mouth 3 (three) times daily as needed for dizziness.   metFORMIN (GLUCOPHAGE) 500 MG tablet Take 1 tablet (500 mg total) by mouth 2 (two) times daily with a meal.   metoprolol succinate (TOPROL-XL) 50 MG 24 hr tablet TAKE 1 TABLET (50 MG TOTAL) BY MOUTH TWICE A DAY .TAKE WITH OR IMMEDIATELY FOLLOWING A MEAL   nitroGLYCERIN (NITROSTAT)  0.4 MG SL tablet Place 1 tablet (0.4 mg total) under the tongue every 5 (five) minutes as needed for chest pain.   Omega-3 Fatty Acids (FISH OIL) 1200 MG CAPS Take 2 capsules (2,400 mg total) by mouth every morning.   PRALUENT 150 MG/ML SOAJ INJECT 150 MG INTO THE SKIN EVERY 14 (FOURTEEN) DAYS.   [DISCONTINUED] fluticasone (FLONASE) 50 MCG/ACT nasal spray Place 2 sprays into both nostrils daily.     Allergies:   Crestor [rosuvastatin], Lipitor [atorvastatin], and Zestril [lisinopril]   Social History   Socioeconomic History   Marital status: Married    Spouse name: cathy   Number of children: 1   Years of education: 12   Highest education level: Not on file  Occupational History   Occupation: retired  Tobacco Use   Smoking status: Former    Packs/day: 1.00    Years: 50.00    Total pack years: 50.00    Types: Cigarettes    Quit date: 12/17/2004    Years since quitting: 17.5   Smokeless tobacco: Never  Vaping Use   Vaping Use: Never used  Substance and Sexual Activity   Alcohol use: Yes    Comment: occasionally   Drug use: No   Sexual activity: Not on file  Other Topics Concern   Not on file  Social History Narrative   Lives with wife   Caffeine - coffee, 1 cup daily   Social Determinants of Health   Financial Resource Strain: Not on file  Food Insecurity: No Food Insecurity (02/04/2022)   Hunger Vital Sign    Worried About Running Out of Food in the Last Year: Never true    [place] in the Last Year: Never true  Transportation Needs: No Transportation Needs (02/04/2022)   PRAPARE - Hydrologist (Medical): No    Lack of Transportation (Non-Medical): No  Physical Activity: Not on file  Stress: Not on file  Social Connections: Not on file     Family History: The patient's family history includes Clotting disorder in his father; Diabetes in his brother; Hypertension in his mother.  ROS:   Please see the history of present illness.      All other systems reviewed and are negative.  EKGs/Labs/Other Studies Reviewed:    The following studies were reviewed today:  Echo 02/05/2022 1. Left ventricular ejection fraction, by estimation, is 50 to 55%. The  left ventricle has low normal function. The left ventricle has no regional  wall motion abnormalities. Left ventricular diastolic parameters are  indeterminate.   2. Right ventricular systolic function is normal. The right ventricular  size is normal.   3. The mitral valve is normal in structure. No evidence of mitral valve  regurgitation. No evidence of  mitral stenosis. Moderate mitral annular  calcification.   4. The aortic valve is grossly normal. There is mild calcification of the  aortic valve. There is mild thickening of the aortic valve. Aortic valve  regurgitation is not visualized. Aortic valve sclerosis/calcification is  present, without any evidence of  aortic stenosis.   Comparison(s): No prior Echocardiogram.   Conclusion(s)/Recommendation(s): Otherwise normal echocardiogram, with  minor abnormalities described in the report.   EKG:  EKG is not ordered today.    Recent Labs: 10/21/2021: Brain Natriuretic Peptide 30 02/05/2022: Magnesium 1.7; TSH 2.213 04/21/2022: ALT 12; BUN 17; Creatinine, Ser 0.74; Potassium 3.7; Sodium 140 04/28/2022: Hemoglobin 13.2; Platelets 269.0  Recent Lipid Panel    Component Value Date/Time   CHOL 155 04/07/2022 0745   CHOL 72 (L) 10/09/2020 0906   TRIG 97.0 04/07/2022 0745   HDL 39.70 04/07/2022 0745   HDL 42 10/09/2020 0906   CHOLHDL 4 04/07/2022 0745   VLDL 19.4 04/07/2022 0745   LDLCALC 96 04/07/2022 0745   LDLCALC 15 10/09/2020 0906     Risk Assessment/Calculations:           Physical Exam:    VS:  BP 112/60 (BP Location: Right Arm, Patient Position: Sitting, Cuff Size: Normal)   Pulse 65   Ht '6\' 1"'$  (1.854 m)   Wt 224 lb (101.6 kg)   BMI 29.55 kg/m         Wt Readings from Last 3 Encounters:   07/10/22 224 lb (101.6 kg)  04/28/22 221 lb (100.2 kg)  04/23/22 218 lb (98.9 kg)     GEN:  Well nourished, well developed in no acute distress HEENT: Normal NECK: No JVD; No carotid bruits LYMPHATICS: No lymphadenopathy CARDIAC: RRR, no murmurs, rubs, gallops RESPIRATORY:  Clear to auscultation without rales, wheezing or rhonchi  ABDOMEN: Soft, non-tender, non-distended MUSCULOSKELETAL:  No edema; No deformity  SKIN: Warm and dry NEUROLOGIC:  Alert and oriented x 3 PSYCHIATRIC:  Normal affect   ASSESSMENT:    1. Coronary artery disease involving coronary bypass graft of native heart without angina pectoris   2. Hyperlipidemia LDL goal <70   3. Primary hypertension   4. PAD (peripheral artery disease) (HCC)    PLAN:    In order of problems listed above:  CAD s/p CABG: Imdur was increased to 90 mg daily during the last visit, chest pain has resolved since then.  Continue aspirin, Plavix, and Praluent  Hyperlipidemia: Has not take Praluent since November, I discussed his case with our clinical pharmacist will reenroll him in [employer] [facility].  Hypertension: Blood pressure stable  PAD: Denies any claudication symptoms.           Medication Adjustments/Labs and Tests Ordered: Current medicines are reviewed at length with the patient today.  Concerns regarding medicines are outlined above.  Orders Placed This Encounter  Procedures   Lipid panel   Hepatic function panel   No orders of the defined types were placed in this encounter.   Patient Instructions  Medication Instructions:   Your physician recommends that you continue on your current medications as directed. Please refer to the Current Medication list given to you today.  *If you need a refill on your cardiac medications before your next appointment, please call your pharmacy*  Lab Work: Your physician recommends that you return for lab work 1-2 days prior to follow up with Dr. Gwenlyn Found:  Fasting  Lipid Panel-DO NOT eat or drink past midnight. Okay to have water and/or  black coffee to drink the morning of. Hepatic (Liver) Function Test   If you have labs (blood work) drawn today and your tests are completely normal, you will receive your results only by: MyChart Message (if you have MyChart) OR A paper copy in the mail If you have any lab test that is abnormal or we need to change your treatment, we will call you to review the results.  Testing/Procedures: NONE ordered at this time of appointment   Follow-Up: At [facility], you and your health needs are our priority.  As part of our continuing mission to provide you with exceptional heart care, we have created designated Provider Care Teams.  These Care Teams include your primary Cardiologist (physician) and Advanced Practice Providers (APPs -  Physician Assistants and Nurse Practitioners) who all work together to provide you with the care you need, when you need it.  Your next appointment:   4-6 month(s)  Provider:   Quay Burow, MD     Other Instructions     Signed, Almyra Deforest, [place]  07/11/2022 11:39 AM    [place]

## 2022-07-11 ENCOUNTER — Other Ambulatory Visit (HOSPITAL_COMMUNITY): Payer: Self-pay

## 2022-07-11 NOTE — Telephone Encounter (Signed)
Pharmacy Patient Advocate Encounter Praluent denied as it is not preferred by plan. Discussed with RPH to change patient to [place]. PA pending.   Received notification from [place] that prior authorization for REPATHA 140 MG/ML INJ is needed.    PA submitted on 07/11/22  Key [id number] Status is pending  Karie Soda, [place] Patient Advocate Specialist Direct Number: [phone] Fax: [phone]

## 2022-07-11 NOTE — Telephone Encounter (Signed)
Pharmacy Patient Advocate Encounter  Prior Authorization for REPATHA 140 MG/ML INJ has been approved.    Effective dates: 05/19/22 through 05/19/23  Karie Soda, [place] Patient Advocate Specialist Direct Number: [phone] Fax: [phone]

## 2022-07-13 ENCOUNTER — Other Ambulatory Visit: Payer: Self-pay | Admitting: Medical

## 2022-07-16 ENCOUNTER — Other Ambulatory Visit: Payer: Self-pay | Admitting: Cardiovascular Disease

## 2022-07-16 DIAGNOSIS — E785 Hyperlipidemia, unspecified: Secondary | ICD-10-CM

## 2022-07-16 DIAGNOSIS — I739 Peripheral vascular disease, unspecified: Secondary | ICD-10-CM

## 2022-07-16 DIAGNOSIS — I25810 Atherosclerosis of coronary artery bypass graft(s) without angina pectoris: Secondary | ICD-10-CM

## 2022-07-21 ENCOUNTER — Other Ambulatory Visit: Payer: Self-pay | Admitting: Cardiovascular Disease

## 2022-07-21 DIAGNOSIS — E785 Hyperlipidemia, unspecified: Secondary | ICD-10-CM

## 2022-07-21 DIAGNOSIS — I739 Peripheral vascular disease, unspecified: Secondary | ICD-10-CM

## 2022-07-21 DIAGNOSIS — I25810 Atherosclerosis of coronary artery bypass graft(s) without angina pectoris: Secondary | ICD-10-CM

## 2022-07-22 NOTE — Telephone Encounter (Signed)
Does not look like pt was made aware of formulary change and did not have Repatha sent in to his pharmacy to replace his Praluent. I have done both.

## 2022-07-30 ENCOUNTER — Other Ambulatory Visit: Payer: Self-pay | Admitting: Cardiovascular Disease

## 2022-08-05 NOTE — Telephone Encounter (Signed)
Enrolled in [facility]

## 2022-08-19 ENCOUNTER — Ambulatory Visit: Payer: Medicare HMO | Admitting: Podiatry

## 2022-08-29 ENCOUNTER — Other Ambulatory Visit: Payer: Self-pay | Admitting: Cardiovascular Disease

## 2022-09-08 ENCOUNTER — Ambulatory Visit: Payer: Medicare HMO | Admitting: Podiatry

## 2022-09-08 DIAGNOSIS — E1142 Type 2 diabetes mellitus with diabetic polyneuropathy: Secondary | ICD-10-CM

## 2022-09-08 DIAGNOSIS — M79674 Pain in right toe(s): Secondary | ICD-10-CM | POA: Diagnosis not present

## 2022-09-08 DIAGNOSIS — B351 Tinea unguium: Secondary | ICD-10-CM | POA: Diagnosis not present

## 2022-09-08 DIAGNOSIS — M79675 Pain in left toe(s): Secondary | ICD-10-CM | POA: Diagnosis not present

## 2022-09-08 NOTE — Progress Notes (Signed)
  Subjective:  Patient ID: [name], male    DOB: 08/27/1940,  MRN: [id number]  Chief Complaint  Patient presents with   diabetic foot care     82 y.o. male presents with the above complaint. History confirmed with patient. Patient presenting with pain related to dystrophic thickened elongated nails. Patient is unable to trim own nails related to nail dystrophy and/or mobility issues. Patient does  have a history of T2DM.    Objective:  Physical Exam: warm, good capillary refill nail exam onychomycosis of the toenails, onycholysis, and dystrophic nails DP pulses palpable, PT pulses palpable, and protective sensation absent Left Foot:  Pain with palpation of nails due to elongation and dystrophic growth.  Right Foot: Pain with palpation of nails due to elongation and dystrophic growth.   Assessment:   1. Pain due to onychomycosis of toenails of both feet   2. Diabetic polyneuropathy associated with type 2 diabetes mellitus       Plan:  Patient was evaluated and treated and all questions answered.  #Onychomycosis with pain  -Nails palliatively debrided as below. -Educated on self-care  Procedure: Nail Debridement Rationale: Pain Type of Debridement: manual, sharp debridement. Instrumentation: Nail nipper, rotary burr. Number of Nails: 10  Return in about 4 months (around 01/08/2023) for [facility].         Corinna Gab, DPM [facility] / [facility]

## 2022-10-20 ENCOUNTER — Other Ambulatory Visit: Payer: Self-pay | Admitting: Cardiovascular Disease

## 2022-10-29 ENCOUNTER — Ambulatory Visit: Payer: Medicare HMO | Admitting: Physician Assistant

## 2022-10-29 ENCOUNTER — Ambulatory Visit (HOSPITAL_COMMUNITY)
Admission: RE | Admit: 2022-10-29 | Discharge: 2022-10-29 | Disposition: A | Discharge status: Home / Self Care | Payer: Medicare HMO | Source: Ambulatory Visit | Attending: Vascular Surgery | Admitting: Vascular Surgery

## 2022-10-29 ENCOUNTER — Encounter: Payer: Self-pay | Admitting: Physician Assistant

## 2022-10-29 VITALS — BP 163/70 | HR 62 | Temp 98.0°F | Resp 16 | Ht 73.0 in | Wt 218.2 lb

## 2022-10-29 DIAGNOSIS — I739 Peripheral vascular disease, unspecified: Secondary | ICD-10-CM

## 2022-10-29 NOTE — Progress Notes (Signed)
[facility] [place] HISTORY AND PHYSICAL   History of Present Illness:  Patient is a 82 y.o. year old male who presents for evaluation of PAD.  He is s/p :Left common femoral to below-knee popliteal artery bypass with 6 mm ringed PTFE on 01/28/22 by Dr. 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  He did have post op lymphocele but at time of his last visit on 02/19/2022 the lymphocele had stopped draining.   The GT has fully healed.  He is ambulatory and denies rest pain, short distance claudication or non healing new wounds. He continues to stay busy with yard work.  He has tolerable claudication symptoms.      The pt  is on a statin for cholesterol management.  The pt is on a daily aspirin.   Other AC: Plavix The pt is on CCB, BB for hypertension.   The pt is diabetic.   Tobacco hx:  Former, quit 2006    Past Medical History:  Diagnosis Date   Claudication (HCC) 06/27/2011   Left ABIs abnormal values with mild-moderate arterial insufficiency, right SFA moderate amount of mixed density plaque elevating velocities suggestive 50-69% diameter reduction   Coronary artery disease 06/27/2011   Normal Lexiscan, EF 64, post-stress EF 64, no EKG changes   Diabetes mellitus without complication (HCC)    prediabetic   Hyperlipidemia    Hypertension    Impotence of organic origin    Osteoarthritis    Other testicular hypofunction    PVD (peripheral vascular disease) (HCC)     Past Surgical History:  Procedure Laterality Date   ABDOMINAL AORTOGRAM N/A 06/28/2020   Procedure: ABDOMINAL AORTOGRAM;  Surgeon: [name], MD;  Location: [facility] INVASIVE [facility];  Service: Cardiovascular;  Laterality: N/A;   ABDOMINAL AORTOGRAM W/LOWER EXTREMITY Left 01/27/2022   Procedure: ABDOMINAL AORTOGRAM W/LOWER EXTREMITY;  Surgeon: Maeola Harman, MD;  Location: [facility] INVASIVE CV LAB;  Service: Cardiovascular;  Laterality: Left;    CARDIAC CATHETERIZATION  12/26/2004   Left main-90% distal tapered stenosis, left circumflex-95% ostial stenosis, first marginal branch 99% ostial branch, posterior descending artery 99% ostial stenosis, 60-70% segmental mid stenosis, needed CABG   CORONARY ARTERY BYPASS GRAFT  12/27/2004   Left main 3-vessel disease, LIMA to LAD, saphenous vein to circumflexmarginal branch and to the PDA   ENDARTERECTOMY FEMORAL Left 01/28/2022   Procedure: LEFT ENDARTERECTOMYCOMMON  [facility] BELOW KNEE POPLITEAL;  Surgeon: Maeola Harman, MD;  Location: [facility] OR;  Service: Vascular;  Laterality: Left;   FEMORAL-POPLITEAL BYPASS GRAFT Left 01/28/2022   Procedure: LEFT FEMORAL-POPLITEAL ARTERY BYPASS;  Surgeon: Maeola Harman, MD;  Location: [facility] OR;  Service: Vascular;  Laterality: Left;   INGUINAL HERNIA REPAIR Left 2005   LOWER EXTREMITY ANGIOGRAPHY N/A 04/13/2017   Procedure: LOWER EXTREMITY ANGIOGRAPHY;  Surgeon: [name], MD;  Location: [facility] INV[facility] CV LAB;  Service: Cardiovascular;  Laterality: N/A;   LOWER EXTREMITY ANGIOGRAPHY Bilateral 12/06/2020   Procedure: Lower Extremity Angiography;  Surgeon: [name], MD;  Location: [facility] INVASIVE CV LAB;  Service: Cardiovascular;  Laterality: Bilateral;  Limited Study   LOWER EXTREMITY INTERVENTION N/A 08/04/2016   Procedure: Lower Extremity Intervention;  Surgeon: [name], MD;  Location: [facility] INVASIVE CV LAB;  Service: Cardiovascular;  Laterality: N/A;   PERIPHERAL VASCULAR ATHERECTOMY Left 04/13/2017   Procedure: PERIPHERAL VASCULAR ATHERECTOMY;  Surgeon: [name], MD;  Location: [facility] INVASIVE CV LAB;  Service: Cardiovascular;  Laterality: Left;  SFA   PERIPHERAL VASCULAR ATHERECTOMY  12/06/2020   Procedure: PERIPHERAL VASCULAR ATHERECTOMY;  Surgeon: [name], MD;  Location: [facility] INVASIVE CV LAB;  Service: Cardiovascular;;  Lt Iliac   PERIPHERAL VASCULAR BALLOON ANGIOPLASTY Left 04/13/2017   Procedure: PERIPHERAL  VASCULAR BALLOON ANGIOPLASTY;  Surgeon: [name], MD;  Location: [facility] CV [facility];  Service: Cardiovascular;  Laterality: Left;  SFA   PERIPHERAL VASCULAR INTERVENTION  12/06/2020   Procedure: PERIPHERAL VASCULAR INTERVENTION;  Surgeon: [name], MD;  Location: [facility] INVASIVE CV LAB;  Service: Cardiovascular;;  Lt Iliac   RIGHT/LEFT HEART CATH AND CORONARY/GRAFT ANGIOGRAPHY N/A 06/28/2020   Procedure: RIGHT/LEFT HEART CATH AND CORONARY/GRAFT ANGIOGRAPHY;  Surgeon: [name], MD;  Location: [facility] INVASIVE CV LAB;  Service: Cardiovascular;  Laterality: N/A;    ROS:   General:  No weight loss, Fever, chills  HEENT: No recent headaches, no nasal bleeding, no visual changes, no sore throat  Neurologic: No dizziness, blackouts, seizures. No recent symptoms of stroke or mini- stroke. No recent episodes of slurred speech, or temporary blindness.  Cardiac: No recent episodes of chest pain/pressure, no shortness of breath at rest.  No shortness of breath with exertion.  Denies history of atrial fibrillation or irregular heartbeat  Vascular: No history of rest pain in feet.  No history of claudication.  No history of non-healing ulcer, No history of DVT   Pulmonary: No home oxygen, no productive cough, no hemoptysis,  No asthma or wheezing  Musculoskeletal:  [ ]  Arthritis, [ ]  Low back pain,  [ ]  Joint pain  Hematologic:No history of hypercoagulable state.  No history of easy bleeding.  No history of anemia  Gastrointestinal: No hematochezia or melena,  No gastroesophageal reflux, no trouble swallowing  Urinary: [ ]  chronic Kidney disease, [ ]  on HD - [ ]  MWF or [ ]  TTHS, [ ]  Burning with urination, [ ]  Frequent urination, [ ]  Difficulty urinating;   Skin: No rashes  Psychological: No history of anxiety,  No history of depression  Social History Social History   Tobacco Use   Smoking status: Former    Packs/day: 1.00    Years: 50.00    Additional pack years: 0.00     Total pack years: 50.00    Types: Cigarettes    Quit date: 12/17/2004    Years since quitting: 17.8   Smokeless tobacco: Never  Vaping Use   Vaping Use: Never used  Substance Use Topics   Alcohol use: Yes    Comment: occasionally   Drug use: No    Family History Family History  Problem Relation Age of Onset   Hypertension Mother        76   Clotting disorder Father        47   Diabetes Brother        heart surgery    Allergies  Allergies  Allergen Reactions   Crestor [Rosuvastatin] Other (See Comments)    Myalgias   Lipitor [Atorvastatin] Other (See Comments)    Myalgias    Zestril [Lisinopril] Cough     Current Outpatient Medications  Medication Sig Dispense Refill   amLODipine (NORVASC) 10 MG tablet Take 1 tablet (10 mg total) by mouth daily. 90 tablet 3   aspirin 81 MG tablet Take 81 mg by mouth daily.     Cholecalciferol (VITAMIN D) 50 MCG (2000 UT) tablet Take 2,000 Units by mouth daily.     clopidogrel (PLAVIX) 75 MG  tablet TAKE 1 TABLET BY MOUTH EVERY DAY 90 tablet 3   Coenzyme Q10 (COQ10) 200 MG CAPS Take 200 mg by mouth daily.     doxycycline (VIBRA-TABS) 100 MG tablet Take 1 tablet (100 mg total) by mouth 2 (two) times daily. 20 tablet 0   Evolocumab (REPATHA SURECLICK) 140 MG/ML SOAJ Inject 140 mg into the skin every 14 (fourteen) days. 2 mL 11   ezetimibe (ZETIA) 10 MG tablet Take 1 tablet (10 mg total) by mouth daily. 90 tablet 3   famotidine (PEPCID) 20 MG tablet TAKE 1 TABLET BY MOUTH EVERY DAY 90 tablet 1   isosorbide mononitrate (IMDUR) 60 MG 24 hr tablet Take 1.5 tablets (90 mg total) by mouth daily. 135 tablet 3   levocetirizine (XYZAL) 5 MG tablet TAKE 1 TABLET BY MOUTH EVERY DAY IN THE EVENING 90 tablet 1   levofloxacin (LEVAQUIN) 750 MG tablet Take 750 mg by mouth daily. 30 day course.     meclizine (ANTIVERT) 12.5 MG tablet Take 1 tablet (12.5 mg total) by mouth 3 (three) times daily as needed for dizziness. 8 tablet 0   metFORMIN  (GLUCOPHAGE) 500 MG tablet Take 1 tablet (500 mg total) by mouth 2 (two) times daily with a meal. 180 tablet 3   metoprolol succinate (TOPROL-XL) 50 MG 24 hr tablet TAKE 1 TABLET (50 MG TOTAL) BY MOUTH TWICE A DAY .TAKE WITH OR IMMEDIATELY FOLLOWING A MEAL 180 tablet 2   nitroGLYCERIN (NITROSTAT) 0.4 MG SL tablet Place 1 tablet (0.4 mg total) under the tongue every 5 (five) minutes as needed for chest pain. 25 tablet 3   Omega-3 Fatty Acids (FISH OIL) 1200 MG CAPS Take 2 capsules (2,400 mg total) by mouth every morning.     No current facility-administered medications for this visit.    Physical Examination  Vitals:   10/29/22 1532  BP: (!) 163/70  Pulse: 62  Resp: 16  Temp: 98 F (36.7 C)  TempSrc: Temporal  SpO2: 93%  Weight: 218 lb 3.2 oz (99 kg)  Height: 6\' 1"  (1.854 m)    Body mass index is 28.79 kg/m.  General:  Alert and oriented, no acute distress HEENT: Normal Neck: No bruit or JVD Pulmonary: Clear to auscultation bilaterally Cardiac: Regular Rate and Rhythm without murmur Abdomen: Soft, non-tender, non-distended, no mass, no scars Skin: No rash Extremity: No ischemic changes Musculoskeletal: Mild Left LE edema, no open wounds  Neurologic: Upper and lower extremity motor 5/5 and symmetric  DATA:  ABI Findings:  +---------+------------------+-----+----------+--------+  Right   Rt Pressure (mmHg)IndexWaveform  Comment   +---------+------------------+-----+----------+--------+  Brachial 142                                        +---------+------------------+-----+----------+--------+  PTA     255               1.80 monophasic          +---------+------------------+-----+----------+--------+  DP      0                 0.00 absent              +---------+------------------+-----+----------+--------+  Great Toe51                0.36                     +---------+------------------+-----+----------+--------+    +---------+------------------+-----+----------+-------+  Left    Lt Pressure (mmHg)IndexWaveform  Comment  +---------+------------------+-----+----------+-------+  Brachial 141                                       +---------+------------------+-----+----------+-------+  PTA     120               0.85 monophasic         +---------+------------------+-----+----------+-------+  DP      150               1.06 monophasic         +---------+------------------+-----+----------+-------+  Great Toe79                0.56                    +---------+------------------+-----+----------+-------+   +-------+-----------+-----------+------------+------------+  ABI/TBIToday's ABIToday's TBIPrevious ABIPrevious TBI  +-------+-----------+-----------+------------+------------+  Right Gisela         0.36       1.11        0.35          +-------+-----------+-----------+------------+------------+  Left  1.06       0.56       1.11        bandage       +-------+-----------+-----------+------------+------------+      Arterial wall calcification precludes accurate ankle pressures and ABIs.  Previous ABI on 04/23/22.    Summary:  Right: Resting right ankle-brachial index indicates noncompressible right  lower extremity arteries. The right toe-brachial index is abnormal.   Left: Resting left ankle-brachial index is within normal range. The left  toe-brachial index is abnormal.   +-----------+--------+-----+--------+----------+--------+  LEFT      PSV cm/sRatioStenosisWaveform  Comments  +-----------+--------+-----+--------+----------+--------+  ATA Distal 106                  biphasic            +-----------+--------+-----+--------+----------+--------+  PTA Distal 25                   monophasic          +-----------+--------+-----+--------+----------+--------+  PERO Distal49                   biphasic             +-----------+--------+-----+--------+----------+--------+     Left Graft #1: Fem-BK pop  +--------------------+--------+--------+---------+--------+                     PSV cm/sStenosisWaveform Comments  +--------------------+--------+--------+---------+--------+  Inflow             78              biphasic           +--------------------+--------+--------+---------+--------+  Proximal Anastomosis132             triphasic          +--------------------+--------+--------+---------+--------+  Proximal Graft      93              biphasic           +--------------------+--------+--------+---------+--------+  Mid Graft           76              biphasic           +--------------------+--------+--------+---------+--------+  Distal Graft  87              biphasic           +--------------------+--------+--------+---------+--------+  Distal Anastomosis  93              biphasic           +--------------------+--------+--------+---------+--------+  Outflow            116             biphasic           +--------------------+--------+--------+---------+--------+       Summary:  Left: Patent graft with no stenosis.    ASSESSMENT/PLAN:   82 y.o. male here for follow up for PAD. He is s/p :Left common femoral to below-knee popliteal artery bypass with 6 mm ringed PTFE on 01/28/22 by Dr. 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  He did have post op lymphocele but this is now resolved.   The bypass graft is patent with biphasic flow.  The ABI's are unchanged and demonstrate calcified vessels with elevated index.     He will continue to walk as much as he tolerates, I encouraged elevation at rest to assist with the edema.  If he develops ischemic changes he will call our office.  He will f/u for repeat surveillance.        Mosetta Pigeon PA-C [facility]  [place] Office: [phone]  MD in clinic [place]

## 2022-10-30 ENCOUNTER — Encounter: Payer: Self-pay | Admitting: Physician Assistant

## 2022-10-30 LAB — VAS US ABI WITH/WO TBI: Left ABI: 1.06

## 2022-11-10 ENCOUNTER — Other Ambulatory Visit: Payer: Self-pay

## 2022-11-10 DIAGNOSIS — I739 Peripheral vascular disease, unspecified: Secondary | ICD-10-CM

## 2022-11-18 ENCOUNTER — Ambulatory Visit: Payer: Medicare HMO | Attending: Cardiovascular Disease | Admitting: Cardiovascular Disease

## 2022-11-18 ENCOUNTER — Encounter: Payer: Self-pay | Admitting: Cardiovascular Disease

## 2022-11-18 VITALS — BP 110/82 | HR 56 | Ht 73.0 in | Wt 216.8 lb

## 2022-11-18 DIAGNOSIS — Z951 Presence of aortocoronary bypass graft: Secondary | ICD-10-CM | POA: Diagnosis not present

## 2022-11-18 DIAGNOSIS — I10 Essential (primary) hypertension: Secondary | ICD-10-CM

## 2022-11-18 DIAGNOSIS — E785 Hyperlipidemia, unspecified: Secondary | ICD-10-CM | POA: Diagnosis not present

## 2022-11-18 DIAGNOSIS — I739 Peripheral vascular disease, unspecified: Secondary | ICD-10-CM

## 2022-11-18 LAB — LIPID PANEL
Chol/HDL Ratio: 1.7 ratio (ref 0.0–5.0)
Cholesterol, Total: 79 mg/dL — ABNORMAL LOW (ref 100–199)
HDL: 46 mg/dL (ref >39)
LDL Chol Calc (NIH): 17 mg/dL (ref 0–99)
Triglycerides: 78 mg/dL (ref 0–149)
VLDL Cholesterol Cal: 16 mg/dL (ref 5–40)

## 2022-11-18 LAB — HEPATIC FUNCTION PANEL
ALT: 17 IU/L (ref 0–44)
AST: 14 IU/L (ref 0–40)
Albumin: 4.5 g/dL (ref 3.8–4.8)
Alkaline Phosphatase: 83 IU/L (ref 44–121)
Bilirubin Total: 1.3 mg/dL — ABNORMAL HIGH (ref 0.0–1.2)
Bilirubin, Direct: 0.38 mg/dL (ref 0.00–0.40)
Total Protein: 6.5 g/dL (ref 6.0–8.5)

## 2022-11-18 NOTE — Assessment & Plan Note (Signed)
Essential hypertension blood pressure measured today at 110/82.  He is on amlodipine and metoprolol.

## 2022-11-18 NOTE — Assessment & Plan Note (Signed)
History of peripheral arterial disease status post multiple interventions by myself in the past beginning 08/04/2016.  He has had known occlusion of his right SFA with high-grade left SFA stenosis.  I performed orbital atherectomy of this in the past and most recently performed orbital atherectomy, PTA and stenting of his left common iliac artery 12/06/2020.  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  He follows up with Dr. Randie Heinz  for this which recently showed his graft to be patent.  He denies claudication.  His wound had healed.

## 2022-11-18 NOTE — Assessment & Plan Note (Signed)
History of CAD status post bypass grafting 12/27/2004 for left main/three-vessel disease by Dr. Cornelius Moras .  He had a LIMA to his LAD, vein to the circumflex marginal as well as to the PDA.  His last catheterization performed because of chest pain by myself 06/28/2020 revealed a patent vein to the distal PDA and OM branch as well as LIMA to the LAD.  He did have a 90% apical LAD stenosis after LIMA insertion.  His pain improved after the catheterization although no intervention was done.  I did uptitrate his long-acting nitrate and since that time he has had no recurrent chest pain.

## 2022-11-18 NOTE — Progress Notes (Signed)
11/18/2022 [name]   11/11/1940  [id number]  Primary Physician Saguier, Kateri Mc Primary Cardiologist: [name] MD FACP, [place], [place], [place]  HPI:  [name] is a 82 y.o.  mildly overweight married Caucasian male whoI last saw him in the office 04/04/2022.Marland Kitchen  He is accompanied by his wife [name] today.  He is the father of one child, grandfather to  one grandchild. I last saw him in the office 06/26/2020. 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 [name] December 27, 2004, for left main 3-vessel disease. He had a LIMA to his LAD, a vein to his circumflex marginal branch as well as to the PDA. His other problems include hypertension and hyperlipidemia. He denies chest pain or shortness of breath. He does have left ABI in the 0.85range with a high-frequency signal in his left SFA by duplex ultrasound 10/20/12 but did not claudication at that time. His last Myoview performed 06/27/11 was nonischemic.4 years ago he developed lifestyle limiting bilateral lower extremity claudication. Should be noted that he did stop smoking at the time of his bypass surgery in 2006. He underwent peripheral angiography by myself 08/04/16 revealing a long segment calcified occlusion right SFA and high-grade diffuse calcified left SFA stenosis. I did not think that he could be percutaneously revascularized on the right, and therefore opted for medical therapy. This Pletal has been titrated to 100 mg by mouth twice a day without significant improvement in his claudication symptoms. These are lifestyle limiting. He denies chest pain or shortness of breath.   I performed peripheral angiography and endovascular therapy of his diffusely calcified and highly obstructive left SFA using diamondback orbital rotational atherectomy followed by drug-eluting balloon plasty. This left ABI increased from 0.63-.85 and his claudication has significantly improved and were resolved.    He  had  progressive upper extremity effort angina.  He did have a negative Myoview 07/14/2019.  He saw Richard Shelter, PA-C in the office 04/27/2020.  His Imdur was increased but this is not affected the frequency and/or severity of his symptoms.  He also complains of claudication bilaterally.  He has a known occluded right SFA from prior angiography status post orbital atherectomy, PTA of his subtotally occluded calcified left SFA 04/13/2017.  Recent Dopplers performed 04/26/2020 revealed a left ABI of 0.52 with an occluded left popliteal artery.   I performed right left heart cath on him 06/28/2020 revealing a left dominant system, normal LV function, patent vein to the distal left PDA, LM branch and LIMA to the LAD.  He did have a 90% apical LAD stenosis after LIMA insertion.  His hemodynamics are stable.  He says he feels better since the heart cath although I did not do an intervention.  I also performed abdominal aortography and bilateral iliac angiography revealing no obstructive disease.  He does complain of bilateral calf claudication.  The etiology of his chest pain cannot be explained by the findings on his heart cath I have reassured him and his wife [name].   I performed peripheral angiography on him 12/06/2020 revealing occluded SFAs bilaterally with three-vessel runoff.  He had a 90% calcified exophytic distal left common iliac artery stenosis.  I performed orbital atherectomy, PTA and covered stenting with an 8 mm x 29 mm long VBX covered stent postdilated with a 10 mm x 2 cm balloon.  His follow-up Doppler studies revealed unchanged ABIs although his velocities improved.  His claudication improved as  well although given his SFAs bilaterally.  He still has some lifestyle limiting claudication.  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  He ultimately had left common femoral and tibioperoneal trunk endarterectomy and vein angioplasty with a 6 mm PTFE bypass graft to the below the knee popliteal  artery.  His wound on his left foot has ultimately healed and he was discharged from wound care center last week.  He did have a Myoview stress test performed 02/05/2022 that showed some anterior ischemia but I suspect this is related to his apical LAD lesion that I demonstrated at that time of cardiac catheterization 06/28/2020.  This is somewhat improved with long-acting oral nitrates which I will titrate.  Since I saw him 8 months ago he continues to do well.  His wound on his left foot has completely healed.  Dr. Randie Heinz continues to follow his left lower extremity bypass graft which is doing.  He denies chest pain, shortness of breath or claudication.   Current Meds  Medication Sig   amLODipine (NORVASC) 10 MG tablet Take 1 tablet (10 mg total) by mouth daily.   aspirin 81 MG tablet Take 81 mg by mouth daily.   Cholecalciferol (VITAMIN D) 50 MCG (2000 UT) tablet Take 2,000 Units by mouth daily.   clopidogrel (PLAVIX) 75 MG tablet TAKE 1 TABLET BY MOUTH EVERY DAY   Coenzyme Q10 (COQ10) 200 MG CAPS Take 200 mg by mouth daily.   doxycycline (VIBRA-TABS) 100 MG tablet Take 1 tablet (100 mg total) by mouth 2 (two) times daily.   Evolocumab (REPATHA SURECLICK) 140 MG/ML SOAJ Inject 140 mg into the skin every 14 (fourteen) days.   ezetimibe (ZETIA) 10 MG tablet Take 1 tablet (10 mg total) by mouth daily.   famotidine (PEPCID) 20 MG tablet TAKE 1 TABLET BY MOUTH EVERY DAY   isosorbide mononitrate (IMDUR) 60 MG 24 hr tablet Take 1.5 tablets (90 mg total) by mouth daily.   levocetirizine (XYZAL) 5 MG tablet TAKE 1 TABLET BY MOUTH EVERY DAY IN THE EVENING   levofloxacin (LEVAQUIN) 750 MG tablet Take 750 mg by mouth daily. 30 day course.   meclizine (ANTIVERT) 12.5 MG tablet Take 1 tablet (12.5 mg total) by mouth 3 (three) times daily as needed for dizziness.   metFORMIN (GLUCOPHAGE) 500 MG tablet Take 1 tablet (500 mg total) by mouth 2 (two) times daily with a meal.   metoprolol succinate (TOPROL-XL) 50 MG  24 hr tablet TAKE 1 TABLET (50 MG TOTAL) BY MOUTH TWICE A DAY .TAKE WITH OR IMMEDIATELY FOLLOWING A MEAL   nitroGLYCERIN (NITROSTAT) 0.4 MG SL tablet Place 1 tablet (0.4 mg total) under the tongue every 5 (five) minutes as needed for chest pain.   Omega-3 Fatty Acids (FISH OIL) 1200 MG CAPS Take 2 capsules (2,400 mg total) by mouth every morning.     Allergies  Allergen Reactions   Crestor [Rosuvastatin] Other (See Comments)    Myalgias   Lipitor [Atorvastatin] Other (See Comments)    Myalgias    Zestril [Lisinopril] Cough    Social History   Socioeconomic History   Marital status: Married    Spouse name: cathy   Number of children: 1   Years of education: 12   Highest education level: Not on file  Occupational History   Occupation: retired  Tobacco Use   Smoking status: Former    Packs/day: 1.00    Years: 50.00    Additional pack years: 0.00  Total pack years: 50.00    Types: Cigarettes    Quit date: 12/17/2004    Years since quitting: 17.9   Smokeless tobacco: Never  Vaping Use   Vaping Use: Never used  Substance and Sexual Activity   Alcohol use: Yes    Comment: occasionally   Drug use: No   Sexual activity: Not on file  Other Topics Concern   Not on file  Social History Narrative   Lives with wife   Caffeine - coffee, 1 cup daily   Social Determinants of Health   Financial Resource Strain: Not on file  Food Insecurity: No Food Insecurity (02/04/2022)   Hunger Vital Sign    Worried About Running Out of Food in the Last Year: Never true    Ran Out of Food in the Last Year: Never true  Transportation Needs: No Transportation Needs (02/04/2022)   PRAPARE - Administrator, Civil Service (Medical): No    Lack of Transportation (Non-Medical): No  Physical Activity: Not on file  Stress: Not on file  Social Connections: Not on file  Intimate Partner Violence: Not At Risk (02/05/2022)   Humiliation, Afraid, Rape, and Kick questionnaire    Fear of  Current or Ex-Partner: No    Emotionally Abused: No    Physically Abused: No    Sexually Abused: No     Review of Systems: General: negative for chills, fever, night sweats or weight changes.  Cardiovascular: negative for chest pain, dyspnea on exertion, edema, orthopnea, palpitations, paroxysmal nocturnal dyspnea or shortness of breath Dermatological: negative for rash Respiratory: negative for cough or wheezing Urologic: negative for hematuria Abdominal: negative for nausea, vomiting, diarrhea, bright red blood per rectum, melena, or hematemesis Neurologic: negative for visual changes, syncope, or dizziness All other systems reviewed and are otherwise negative except as noted above.    Blood pressure 110/82, pulse (!) 56, height 6\' 1"  (1.854 m), weight 216 lb 12.8 oz (98.3 kg), SpO2 97 %.  General appearance: alert and no distress Neck: no adenopathy, no carotid bruit, no JVD, supple, symmetrical, trachea midline, and thyroid not enlarged, symmetric, no tenderness/mass/nodules Lungs: clear to auscultation bilaterally Heart: regular rate and rhythm, S1, S2 normal, no murmur, click, rub or gallop Extremities: extremities normal, atraumatic, no cyanosis or edema Pulses: Diminished right pedal pulse Skin: Skin color, texture, turgor normal. No rashes or lesions Neurologic: Grossly normal  EKG EKG Interpretation Date/Time:  Tuesday November 18 2022 08:12:28 EDT Ventricular Rate:  59 PR Interval:  186 QRS Duration:  76 QT Interval:  430 QTC Calculation: 425 R Axis:   36  Text Interpretation: Sinus bradycardia When compared with ECG of 05-Feb-2022 07:14, No significant change was found Confirmed by Nanetta Batty [phone]) on 11/18/2022 8:47:42 AM    ASSESSMENT AND PLAN:   Hx of CABG History of CAD status post bypass grafting 12/27/2004 for left main/three-vessel disease by Dr. Cornelius Moras .  He had a LIMA to his LAD, vein to the circumflex marginal as well as to the PDA.  His last  catheterization performed because of chest pain by myself 06/28/2020 revealed a patent vein to the distal PDA and OM branch as well as LIMA to the LAD.  He did have a 90% apical LAD stenosis after LIMA insertion.  His pain improved after the catheterization although no intervention was done.  I did uptitrate his long-acting nitrate and since that time he has had no recurrent chest pain.  HTN (hypertension) Essential hypertension blood pressure measured  today at 110/82.  He is on amlodipine and metoprolol.  Dyslipidemia History of dyslipidemia on Repatha and Zetia with lipid profile performed 04/07/2022 revealing a total cholesterol 155, LDL of 96 and HDL 39.  He is not at goal for secondary prevention although he did have blood work performed yesterday which showed a total cholesterol 79, LDL of 17 and HDL of 46.  Peripheral vascular disease (HCC) History of peripheral arterial disease status post multiple interventions by myself in the past beginning 08/04/2016.  He has had known occlusion of his right SFA with high-grade left SFA stenosis.  I performed orbital atherectomy of this in the past and most recently performed orbital atherectomy, PTA and stenting of his left common iliac artery 12/06/2020.  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  He follows up with Dr. Randie Heinz  for this which recently showed his graft to be patent.  He denies claudication.  His wound had healed.     [name] MD FACP,FACC,FAHA, FSCAI 11/18/2022 9:00 AM

## 2022-11-18 NOTE — Assessment & Plan Note (Addendum)
History of dyslipidemia on Repatha and Zetia with lipid profile performed 04/07/2022 revealing a total cholesterol 155, LDL of 96 and HDL 39.  He is not at goal for secondary prevention although he did have blood work performed yesterday which showed a total cholesterol 79, LDL of 17 and HDL of 46.

## 2022-11-18 NOTE — Patient Instructions (Signed)
Medication Instructions:  Your physician recommends that you continue on your current medications as directed. Please refer to the Current Medication list given to you today.  *If you need a refill on your cardiac medications before your next appointment, please call your pharmacy*   Follow-Up: At [facility], you and your health needs are our priority.  As part of our continuing mission to provide you with exceptional heart care, we have created designated Provider Care Teams.  These Care Teams include your primary Cardiologist (physician) and Advanced Practice Providers (APPs -  Physician Assistants and Nurse Practitioners) who all work together to provide you with the care you need, when you need it.  We recommend signing up for the patient portal called "MyChart".  Sign up information is provided on this After Visit Summary.  MyChart is used to connect with patients for Virtual Visits (Telemedicine).  Patients are able to view lab/test results, encounter notes, upcoming appointments, etc.  Non-urgent messages can be sent to your provider as well.   To learn more about what you can do with MyChart, go to [email or web address].    Your next appointment:   6 month(s)  Provider:   Hao Meng, PA-C      Then, Jonathan Berry, MD will plan to see you again in 12 month(s).   

## 2022-11-18 NOTE — Progress Notes (Signed)
Normal liver function, very well controlled cholesterol.

## 2022-12-08 ENCOUNTER — Ambulatory Visit: Payer: Medicare HMO | Admitting: Podiatry

## 2023-01-06 ENCOUNTER — Other Ambulatory Visit: Payer: Self-pay | Admitting: Medical

## 2023-01-06 ENCOUNTER — Ambulatory Visit: Payer: Medicare HMO | Admitting: Podiatry

## 2023-01-06 DIAGNOSIS — B351 Tinea unguium: Secondary | ICD-10-CM

## 2023-01-06 DIAGNOSIS — M79674 Pain in right toe(s): Secondary | ICD-10-CM

## 2023-01-06 DIAGNOSIS — M79675 Pain in left toe(s): Secondary | ICD-10-CM

## 2023-01-06 DIAGNOSIS — E1142 Type 2 diabetes mellitus with diabetic polyneuropathy: Secondary | ICD-10-CM

## 2023-01-06 NOTE — Progress Notes (Signed)
  Subjective:  Patient ID: [name], male    DOB: Jun 22, 1940,  MRN: [id number]  Chief Complaint  Patient presents with   Nail Problem    DFC    82 y.o. male presents with the above complaint. History confirmed with patient. Patient presenting with pain related to dystrophic thickened elongated nails. Patient is unable to trim own nails related to nail dystrophy and/or mobility issues. Patient does  have a history of T2DM.    Objective:  Physical Exam: warm, good capillary refill nail exam onychomycosis of the toenails, onycholysis, and dystrophic nails DP pulses palpable, PT pulses palpable, and protective sensation absent Left Foot:  Pain with palpation of nails due to elongation and dystrophic growth.  Right Foot: Pain with palpation of nails due to elongation and dystrophic growth.   Assessment:   1. Pain due to onychomycosis of toenails of both feet   2. Diabetic polyneuropathy associated with type 2 diabetes mellitus (HCC)        Plan:  Patient was evaluated and treated and all questions answered.  #Onychomycosis with pain  -Nails palliatively debrided as below. -Educated on self-care  Procedure: Nail Debridement Rationale: Pain Type of Debridement: manual, sharp debridement. Instrumentation: Nail nipper, rotary burr. Number of Nails: 10  Return in about 3 months (around 04/08/2023) for [facility].         Corinna Gab, DPM [facility] / [facility]

## 2023-01-10 ENCOUNTER — Other Ambulatory Visit: Payer: Self-pay | Admitting: Medical

## 2023-02-05 ENCOUNTER — Other Ambulatory Visit: Payer: Self-pay | Admitting: Cardiovascular Disease

## 2023-03-12 LAB — OPHTHALMOLOGY REPORT-SCANNED

## 2023-04-22 ENCOUNTER — Encounter: Payer: Self-pay | Admitting: Physician Assistant

## 2023-04-22 ENCOUNTER — Ambulatory Visit: Payer: Medicare HMO | Attending: Physician Assistant | Admitting: Physician Assistant

## 2023-04-22 ENCOUNTER — Telehealth: Payer: Self-pay | Admitting: Cardiovascular Disease

## 2023-04-22 VITALS — BP 110/60 | HR 69 | Ht 73.0 in | Wt 221.7 lb

## 2023-04-22 DIAGNOSIS — E785 Hyperlipidemia, unspecified: Secondary | ICD-10-CM | POA: Diagnosis not present

## 2023-04-22 DIAGNOSIS — R079 Chest pain, unspecified: Secondary | ICD-10-CM | POA: Diagnosis not present

## 2023-04-22 DIAGNOSIS — I10 Essential (primary) hypertension: Secondary | ICD-10-CM | POA: Diagnosis not present

## 2023-04-22 DIAGNOSIS — I25709 Atherosclerosis of coronary artery bypass graft(s), unspecified, with unspecified angina pectoris: Secondary | ICD-10-CM | POA: Diagnosis not present

## 2023-04-22 NOTE — Progress Notes (Addendum)
Cardiology Office Note:  .   Date:  04/22/2023  ID:  [name], DOB [birth year], MRN [id number] PCP: Marisue Brooklyn  [facility] Providers Cardiologist:  Nanetta Batty, MD     History of Present Illness: .   [name] is a 82 y.o. 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  Myoview in February 2021 was negative.  Last cardiac catheterization in February 2022 revealed normal EF, left dominant system, patent SVG to distal left PDA, patent LIMA to LAD, 90% apical LAD stenosis after LIMA insertion.  He underwent vascular bypass surgery by Dr. Randie Heinz on 01/28/2022.  Patient returned back to the hospital 5 days after being released from admission of vascular bypass surgery with chest discomfort.  Troponin was mildly elevated.  Patient underwent nuclear stress test on 02/05/2022 which came back intermediate risk with ischemia in a relatively small patch of basal anterior wall and anterior septum, Dr. Royann Shivers suspected this is due to left main coronary artery stenosis feeding a relatively short segment of LAD artery was attached to septals and diagonal with downstream occlusion of the mid LAD.  Area of myocardium was relatively small and risk outweighed the potential benefit to pursue cardiac catheterization.  Echocardiogram showed EF 50-55 % with no regional wall motion abnormality, normal RV, no significant valve disease.  Medical management was recommended.  Beta-blocker was increased.  Hemoglobin was 9.0 at the time of discharge.  I last saw the patient in February 2024 at which time he was not taking his Praluent as due to cost, I discussed with our clinical pharmacist with enrolled him in [facility].  Patient presents today for follow-up.  For the past month, he has been having some left-sided chest pain  radiating to the left shoulder.  This occurs both with exertion and also at rest.  Symptom would last 5 to 10 minutes each before self resolving.  He never took any sublingual nitroglycerin.  EKG showed normal sinus rhythm without significant ST-T wave changes.  He denies any shortness of breath or dizziness.  I recommended a PET stress test and following up in 6 weeks.  He has no lower extremity edema, orthopnea or PND.  ROS:   Patient complains of chest pain but no shortness of breath, no lower extremity edema, orthopnea or PND.  Studies Reviewed: .        Cardiac Studies & Procedures   CARDIAC CATHETERIZATION  CARDIAC CATHETERIZATION 06/28/2020  Narrative Images from the original result were not included.   Ost LM to Mid LM lesion is 90% stenosed.  Ost Cx lesion is 90% stenosed.  Prox Cx lesion is 100% stenosed.  Prox LAD to Mid LAD lesion is 100% stenosed.  Dist LAD lesion is 90% stenosed.  The left ventricular systolic function is normal.  LV end diastolic pressure is normal.  The left ventricular ejection fraction is 55-65% by visual estimate.  [name] is a 82 y.o. male   [id number] LOCATION:  FACILITY: [facility] PHYSICIAN: Nanetta Batty, M.D. [birth year]   DATE OF PROCEDURE:  06/28/2020  DATE OF DISCHARGE:     CARDIAC CATHETERIZATION    History obtained from chart review.[name] is a 82 y.o.  mildly overweight married Caucasian male whoI last saw him in the office  08/02/2019.  He is accompanied by his wife [name] today.  He is the father of one child, grandfather and one grandchild. I last saw him in the office 09/12/16. 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 [name] December 27, 2004, for left main 3-vessel disease. He had a LIMA to his LAD, a vein to his circumflex marginal branch as well as to the PDA. His other problems include hypertension and hyperlipidemia. He denies chest pain or shortness of breath. He does have  left ABI in the 0.85range with a high-frequency signal in his left SFA by duplex ultrasound 10/20/12 but did not claudication at that time. His last Myoview performed 06/27/11 was nonischemic.4 years ago he developed lifestyle limiting bilateral lower extremity claudication. Should be noted that he did stop smoking at the time of his bypass surgery in 2006. He underwent peripheral angiography by myself 08/04/16 revealing a long segment calcified occlusion right SFA and high-grade diffuse calcified left SFA stenosis. I did not think that he could be percutaneously revascularized on the right, and therefore opted for medical therapy. This Pletal has been titrated to 100 mg by mouth twice a day without significant improvement in his claudication symptoms. These are lifestyle limiting. He denies chest pain or shortness of breath. I performed peripheral angiography and endovascular therapy of his diffusely calcified and highly obstructive left SFA using diamondback orbital rotational atherectomy followed by drug-eluting balloon plasty. This left ABI increased from 0.63-.85 and his claudication has significantly improved and were resolved.   His most recent Dopplers performed 04/27/2019 suggest restenosis of his left SFA although he continues to deny claudication.  Denies chest pain or shortness of breath.  He is statin intolerant and was recently approved for Repatha.  Since I saw him in the office a year ago he has had progressive upper extremity effort angina.  He did have a negative Myoview 07/14/2019.  He saw Corine Shelter, PA-C in the office 04/27/2020.  His Imdur was increased but this is not affected the frequency and/or severity of his symptoms.  He also complains of claudication bilaterally.  He has a known occluded right SFA from prior angiography status post orbital atherectomy, PTA of his subtotally occluded calcified left SFA 04/13/2017.  Recent Dopplers performed 04/26/2020 revealed a left ABI of 0.52 with an  occluded left popliteal artery.  Because of ongoing chest pain and shortness of breath he was elected to proceed with outpatient diagnostic coronary angiography to define his anatomy and physiology given that he had 81 year old grafts.  Impression Mr. Debruyne had a left dominant system with a 90% distal left main, occluded LAD and circumflex, patent vein to the PDA on the left, patent vein to an OM branch and patent LIMA to the LAD.  He had normal LV function and normal filling pressures.  There is no "culprit lesion" that would explain his chest pain.  Continue medical therapy will be recommended.  A right femoral artery angiogram was performed and a Mynx closure device was deployed successfully in the right common femoral artery and vein.  Pressure was held.  Patient left lab in stable condition.  He will be gently hydrated, discharged home later this afternoon.  I will see him back in the office in 2 to 3 weeks for follow-up.  Nanetta Batty. MD, [facility] 06/28/2020 1:17 PM  Findings Coronary Findings Diagnostic  Dominance: Left  Left Main Ost LM to Mid LM lesion is 90% stenosed. The lesion is calcified.  Left Anterior Descending Prox LAD to Mid LAD lesion  is 100% stenosed. Dist LAD lesion is 90% stenosed.  Left Circumflex Ost Cx lesion is 90% stenosed. Prox Cx lesion is 100% stenosed.  Graft To LPDA  Graft To 3rd Mrg  LIMA Graft To Mid LAD  Intervention  No interventions have been documented.   STRESS TESTS  NM MYOCAR MULTI W/SPECT W 02/05/2022  Narrative   Findings are consistent with ischemia. The study is intermediate risk.   No ST deviation was noted.   LV perfusion is abnormal. There is evidence of ischemia. Defect 1: There is a small defect with mild reduction in uptake present in the mid to basal anterior location(s) that is reversible. There is normal wall motion in the defect area. Consistent with ischemia.   Left ventricular function is normal. The left ventricular  ejection fraction is normal (55-65%).  Reversible perfusion defect in basal to mid anterior wall consistent with ischemia Intermediate risk study given anterior ischemia, though area of ischemia is small.   ECHOCARDIOGRAM  ECHOCARDIOGRAM COMPLETE 02/05/2022  Narrative ECHOCARDIOGRAM REPORT    Patient Name:   Faraj Oborny Date of Exam: 02/05/2022 Medical Rec #:  [id number]   Height:       73.0 in Accession #:    [id number]  Weight:       214.7 lb Date of Birth:  05-02-1941  BSA:          2.218 m Patient Age:    80 years    BP:           127/64 mmHg Patient Gender: M           HR:           78 bpm. Exam Location:  Inpatient  Procedure: 2D Echo, Cardiac Doppler and Color Doppler  Indications:    Chest pain  History:        Patient has no prior history of Echocardiogram examinations. Prior CABG; Risk Factors:Hypertension, Diabetes and Former Smoker. PAD. Orthopnea.  Sonographer:    Ross Ludwig RDCS (AE) Referring Phys: Therisa Doyne   Sonographer Comments: No subcostal window. IMPRESSIONS   1. Left ventricular ejection fraction, by estimation, is 50 to 55%. The left ventricle has low normal function. The left ventricle has no regional wall motion abnormalities. Left ventricular diastolic parameters are indeterminate. 2. Right ventricular systolic function is normal. The right ventricular size is normal. 3. The mitral valve is normal in structure. No evidence of mitral valve regurgitation. No evidence of mitral stenosis. Moderate mitral annular calcification. 4. The aortic valve is grossly normal. There is mild calcification of the aortic valve. There is mild thickening of the aortic valve. Aortic valve regurgitation is not visualized. Aortic valve sclerosis/calcification is present, without any evidence of aortic stenosis.  Comparison(s): No prior Echocardiogram.  Conclusion(s)/Recommendation(s): Otherwise normal echocardiogram, with minor abnormalities described in the  report.  FINDINGS Left Ventricle: Left ventricular ejection fraction, by estimation, is 50 to 55%. The left ventricle has low normal function. The left ventricle has no regional wall motion abnormalities. The left ventricular internal cavity size was normal in size. There is no left ventricular hypertrophy. Abnormal (paradoxical) septal motion consistent with post-operative status. Left ventricular diastolic parameters are indeterminate.  Right Ventricle: The right ventricular size is normal. No increase in right ventricular wall thickness. Right ventricular systolic function is normal.  Left Atrium: Left atrial size was normal in size.  Right Atrium: Right atrial size was normal in size.  Pericardium: There is no evidence of pericardial effusion.  Mitral Valve:  The mitral valve is normal in structure. Moderate mitral annular calcification. No evidence of mitral valve regurgitation. No evidence of mitral valve stenosis.  Tricuspid Valve: The tricuspid valve is normal in structure. Tricuspid valve regurgitation is trivial. No evidence of tricuspid stenosis.  Aortic Valve: The aortic valve is grossly normal. There is mild calcification of the aortic valve. There is mild thickening of the aortic valve. Aortic valve regurgitation is not visualized. Aortic valve sclerosis/calcification is present, without any evidence of aortic stenosis. Aortic valve mean gradient measures 4.0 mmHg. Aortic valve peak gradient measures 7.4 mmHg. Aortic valve area, by VTI measures 2.62 cm.  Pulmonic Valve: The pulmonic valve was not well visualized. Pulmonic valve regurgitation is not visualized.  Aorta: The ascending aorta was not well visualized and the aortic root is normal in size and structure.  Venous: The inferior vena cava was not well visualized.  IAS/Shunts: The atrial septum is grossly normal.   LEFT VENTRICLE PLAX 2D LVIDd:         5.30 cm   Diastology LVIDs:         4.10 cm   LV e' medial:     6.53 cm/s LV PW:         1.10 cm   LV E/e' medial:  18.1 LV IVS:        0.90 cm   LV e' lateral:   8.27 cm/s LVOT diam:     2.10 cm   LV E/e' lateral: 14.3 LV SV:         72 LV SV Index:   33 LVOT Area:     3.46 cm   RIGHT VENTRICLE RV Basal diam:  3.50 cm RV S prime:     12.40 cm/s TAPSE (M-mode): 3.0 cm  LEFT ATRIUM             Index        RIGHT ATRIUM           Index LA diam:        4.00 cm 1.80 cm/m   RA Area:     21.50 cm LA Vol (A2C):   45.6 ml 20.56 ml/m  RA Volume:   63.10 ml  28.45 ml/m LA Vol (A4C):   45.5 ml 20.52 ml/m LA Biplane Vol: 50.0 ml 22.55 ml/m AORTIC VALVE AV Area (Vmax):    2.73 cm AV Area (Vmean):   2.47 cm AV Area (VTI):     2.62 cm AV Vmax:           136.00 cm/s AV Vmean:          85.300 cm/s AV VTI:            0.276 m AV Peak Grad:      7.4 mmHg AV Mean Grad:      4.0 mmHg LVOT Vmax:         107.00 cm/s LVOT Vmean:        60.900 cm/s LVOT VTI:          0.209 m LVOT/AV VTI ratio: 0.76  AORTA Ao Root diam: 3.60 cm  MITRAL VALVE MV Area (PHT): 4.54 cm     SHUNTS MV Decel Time: 167 msec     Systemic VTI:  0.21 m MV E velocity: 118.00 cm/s  Systemic Diam: 2.10 cm MV A velocity: 89.60 cm/s MV E/A ratio:  1.32  Jodelle Red MD Electronically signed by Jodelle Red MD Signature Date/Time: 02/05/2022/11:32:23 AM    Final  Risk Assessment/Calculations:             Physical Exam:   VS:  BP 110/60 (BP Location: Left Arm, Patient Position: Sitting, Cuff Size: Large)   Pulse 69   Ht 6\' 1"  (1.854 m)   Wt 221 lb 11.2 oz (100.6 kg)   SpO2 96%   BMI 29.25 kg/m    Wt Readings from Last 3 Encounters:  04/22/23 221 lb 11.2 oz (100.6 kg)  11/18/22 216 lb 12.8 oz (98.3 kg)  10/29/22 218 lb 3.2 oz (99 kg)    GEN: Well nourished, well developed in no acute distress NECK: No JVD; No carotid bruits CARDIAC: RRR, no murmurs, rubs, gallops RESPIRATORY:  Clear to auscultation without rales, wheezing or  rhonchi  ABDOMEN: Soft, non-tender, non-distended EXTREMITIES:  No edema; No deformity   ASSESSMENT AND PLAN: .    Chest pain -Intermittent chest pain for the past month, occurs both at rest and with exertion.  Chest pain has been increasing frequency.  Last about 5 to 10 minutes each time. -EKG was normal.  Will proceed with PET stress test.  Patient has been instructed to avoid any caffeinated drinks for 12 hours prior to the test.  N.p.o. in the morning of the test.  Coronary Artery Disease History of CABG in 2006 with recent chest pain for the past month. Last cardiac catheterization in February 2022 showed 90% distal LAD stenosis after the insertion of the LIMA. Stress test in September 2023 showed intermediate risk with ischemia in a relatively small patch of basal anterior wall and anterior septum. Medical management was recommended. -EKG normal.  On aspirin and Plavix  Hypertension: Blood pressure stable.  Hyperlipidemia -Continue on Repatha and Zetia.       Dispo: Follow-up in 6 weeks, earlier if the stress test come back abnormal.  Informed Consent   Shared Decision Making/Informed Consent The risks [chest pain, shortness of breath, cardiac arrhythmias, dizziness, blood pressure fluctuations, myocardial infarction, stroke/transient ischemic attack, nausea, vomiting, allergic reaction, radiation exposure, metallic taste sensation and life-threatening complications (estimated to be 1 in 10,000)], benefits (risk stratification, diagnosing coronary artery disease, treatment guidance) and alternatives of a cardiac PET stress test were discussed in detail with Mr. Guardia and he agrees to proceed.   Signed, Azalee Course, PA

## 2023-04-22 NOTE — Telephone Encounter (Signed)
  Per MyChart scheduling message:  Initial Complaint: Having pains in the chest again every day like before. Did not have them for the longest time. They returned about a month ago; every couple days, now it is every day feeling it.   Pt c/o of Chest Pain: STAT if active CP, including tightness, pressure, jaw pain, radiating pain to shoulder/upper arm/back, CP unrelieved by Nitro. Symptoms reported of SOB, nausea, vomiting, sweating.  1. Are you having CP right now?    2. Are you experiencing any other symptoms (ex. SOB, nausea, vomiting, sweating)?   3. Is your CP continuous or coming and going?   4. Have you taken Nitroglycerin?   5. How long have you been experiencing CP?    6. If NO CP at time of call then end call with telling Pt to call back or call 911 if Chest pain returns prior to return call from triage team.   Answer to questions; Not having chest pain at this minute. it comes and goes no other symptons it comes and goes have not taken a nitroglycerin for about a month now started back again.

## 2023-04-22 NOTE — Telephone Encounter (Signed)
Called and spoke to patient who report chest pain that comes and goes X 1 month. He states the pain is sharp pains on the left side of his chest that is mostly at night while in bed. He deny radiating to arm or jaw and no other symptoms at this time.  He has not had to use nitroglycerin for the pain. Appointment schedule for today with Azalee Course, PA at 1:55.

## 2023-04-22 NOTE — Patient Instructions (Signed)
Medication Instructions:  The current medical regimen is effective;  continue present plan and medications as directed. Please refer to the Current Medication list given to you today.  *If you need a refill on your cardiac medications before your next appointment, please call your pharmacy*  Lab Work: NONE  Testing/Procedures: CARDIAC PET STRESS-NO CAFFEINE 12 HOURS PRIOR  Follow-Up: At [facility], you and your health needs are our priority.  As part of our continuing mission to provide you with exceptional heart care, we have created designated Provider Care Teams.  These Care Teams include your primary Cardiologist (physician) and Advanced Practice Providers (APPs -  Physician Assistants and Nurse Practitioners) who all work together to provide you with the care you need, when you need it.  Your next appointment:   6 week(s)  Provider:   Azalee Course, PA-C

## 2023-04-27 ENCOUNTER — Encounter: Payer: Self-pay | Admitting: Podiatry

## 2023-04-27 ENCOUNTER — Ambulatory Visit: Payer: Medicare HMO | Admitting: Podiatry

## 2023-04-27 DIAGNOSIS — M79674 Pain in right toe(s): Secondary | ICD-10-CM | POA: Diagnosis not present

## 2023-04-27 DIAGNOSIS — B351 Tinea unguium: Secondary | ICD-10-CM | POA: Diagnosis not present

## 2023-04-27 DIAGNOSIS — M79675 Pain in left toe(s): Secondary | ICD-10-CM | POA: Diagnosis not present

## 2023-04-27 DIAGNOSIS — I739 Peripheral vascular disease, unspecified: Secondary | ICD-10-CM

## 2023-04-27 DIAGNOSIS — E1142 Type 2 diabetes mellitus with diabetic polyneuropathy: Secondary | ICD-10-CM

## 2023-04-27 NOTE — Progress Notes (Signed)
  Subjective:  Patient ID: [name], male    DOB: 01-Oct-1940,  MRN: [id number]  Chief Complaint  Patient presents with   Foot Pain    Nail Trim Both Feet.  Recently checked for DM    82 y.o. male presents with the above complaint. History confirmed with patient. Patient presenting with pain related to dystrophic thickened elongated nails. Patient is unable to trim own nails related to nail dystrophy and/or mobility issues. Patient does  have a history of T2DM.  Does report history of left lower extremity bypass several years ago. History of CABG. No other pedal complaints  Objective:  Physical Exam:  Nail exam onychomycosis of the toenails, onycholysis, and dystrophic nails Nonpalpable DP and PT pulses bilaterally.  Capillary refill time approximately 3 seconds. Protective sensation absent. Left Foot:  Pain with palpation of nails due to elongation and dystrophic growth.  Right Foot: Pain with palpation of nails due to elongation and dystrophic growth.   Assessment:   1. Pain due to onychomycosis of toenails of both feet   2. Diabetic polyneuropathy associated with type 2 diabetes mellitus (HCC)   3. PAD (peripheral artery disease) (HCC)        Plan:  Patient was evaluated and treated and all questions answered.  #Onychomycosis with pain  -Nails palliatively debrided as below. -Educated on self-care  Procedure: Nail Debridement Rationale: Pain Type of Debridement: manual, sharp debridement. Instrumentation: Nail nipper, rotary burr. Number of Nails: 10  Return in about 3 months (around 07/26/2023) for Diabetic Foot Care.         Bronwen Betters, DPM [facility] / [facility]

## 2023-05-06 ENCOUNTER — Ambulatory Visit (HOSPITAL_COMMUNITY)
Admission: RE | Admit: 2023-05-06 | Discharge: 2023-05-06 | Disposition: A | Discharge status: Home / Self Care | Payer: Medicare HMO | Source: Ambulatory Visit | Attending: Vascular Surgery | Admitting: Vascular Surgery

## 2023-05-06 ENCOUNTER — Ambulatory Visit (INDEPENDENT_AMBULATORY_CARE_PROVIDER_SITE_OTHER)
Admission: RE | Admit: 2023-05-06 | Discharge: 2023-05-06 | Disposition: A | Discharge status: Home / Self Care | Payer: Medicare HMO | Source: Ambulatory Visit | Attending: Vascular Surgery | Admitting: Vascular Surgery

## 2023-05-06 ENCOUNTER — Ambulatory Visit: Payer: Medicare HMO | Admitting: Physician Assistant

## 2023-05-06 VITALS — BP 124/70 | HR 63 | Temp 98.2°F | Resp 20 | Ht 73.0 in | Wt 221.0 lb

## 2023-05-06 DIAGNOSIS — I739 Peripheral vascular disease, unspecified: Secondary | ICD-10-CM | POA: Insufficient documentation

## 2023-05-06 LAB — VAS US ABI WITH/WO TBI: Left ABI: 1.03

## 2023-05-06 NOTE — Progress Notes (Signed)
[facility] HISTORY AND PHYSICAL   History of Present Illness:  Patient is a 82 y.o. year old male who presents for evaluation of claudication.   He is s/p :Left common femoral to below-knee popliteal artery bypass with 6 mm ringed PTFE on 01/28/22 by Dr. 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  He did have post op lymphocele but at time of his last visit on 02/19/2022 the lymphocele had stopped draining.    He has lumbar/buttock pain with ambulation greater than a quarter of a mile or less.  He states he ha a history of spinal issues.  He stays busy with yard work, currently burning leaves.  He denies new LE claudication, rest pain or non healing wounds.  He over all feels like he has no changes in his daily activities.     The pt  is on a statin for cholesterol management.  The pt is on a daily aspirin.   Other AC: Plavix The pt is on CCB, BB for hypertension.   The pt is diabetic.   Tobacco hx:  Former, quit 2006  Past Medical History:  Diagnosis Date   Claudication (HCC) 06/27/2011   Left ABIs abnormal values with mild-moderate arterial insufficiency, right SFA moderate amount of mixed density plaque elevating velocities suggestive 50-69% diameter reduction   Coronary artery disease 06/27/2011   Normal Lexiscan, EF 64, post-stress EF 64, no EKG changes   Diabetes mellitus without complication (HCC)    prediabetic   Hyperlipidemia    Hypertension    Impotence of organic origin    Osteoarthritis    Other testicular hypofunction    Peripheral arterial disease (HCC)    PVD (peripheral vascular disease) (HCC)     Past Surgical History:  Procedure Laterality Date   ABDOMINAL AORTOGRAM N/A 06/28/2020   Procedure: ABDOMINAL AORTOGRAM;  Surgeon: [name], MD;  Location: [facility] INVASIVE CV LAB;  Service: Cardiovascular;  Laterality: N/A;   ABDOMINAL AORTOGRAM W/LOWER EXTREMITY Left 01/27/2022   Procedure:  ABDOMINAL AORTOGRAM W/LOWER EXTREMITY;  Surgeon: Maeola Harman, MD;  Location: [facility] INVASIVE CV LAB;  Service: Cardiovascular;  Laterality: Left;   CARDIAC CATHETERIZATION  12/26/2004   Left main-90% distal tapered stenosis, left circumflex-95% ostial stenosis, first marginal branch 99% ostial branch, posterior descending artery 99% ostial stenosis, 60-70% segmental mid stenosis, needed CABG   CORONARY ARTERY BYPASS GRAFT  12/27/2004   Left main 3-vessel disease, LIMA to LAD, saphenous vein to circumflexmarginal branch and to the PDA   ENDARTERECTOMY FEMORAL Left 01/28/2022   Procedure: LEFT ENDARTERECTOMYCOMMON  [facility] BELOW KNEE POPLITEAL;  Surgeon: Maeola Harman, MD;  Location: [facility] OR;  Service: Vascular;  Laterality: Left;   FEMORAL-POPLITEAL BYPASS GRAFT Left 01/28/2022   Procedure: LEFT FEMORAL-POPLITEAL ARTERY BYPASS;  Surgeon: Maeola Harman, MD;  Location: [facility] OR;  Service: Vascular;  Laterality: Left;   INGUINAL HERNIA REPAIR Left 2005   LOWER EXTREMITY ANGIOGRAPHY N/A 04/13/2017   Procedure: LOWER EXTREMITY ANGIOGRAPHY;  Surgeon: [name], MD;  Location: [facility] INVASIVE CV LAB;  Service: Cardiovascular;  Laterality: N/A;   LOWER EXTREMITY ANGIOGRAPHY Bilateral 12/06/2020   Procedure: Lower Extremity Angiography;  Surgeon: [name], MD;  Location: [facility] INVASIVE CV LAB;  Service: Cardiovascular;  Laterality: Bilateral;  Limited Study   LOWER EXTREMITY INTERVENTION N/A 08/04/2016   Procedure: Lower Extremity Intervention;  Surgeon: [name], MD;  Location: [facility] INVASIVE CV LAB;  Service: Cardiovascular;  Laterality: N/A;   PERIPHERAL VASCULAR ATHERECTOMY Left 04/13/2017   Procedure: PERIPHERAL VASCULAR ATHERECTOMY;  Surgeon: [name], MD;  Location: [facility] INVASIVE CV LAB;  Service: Cardiovascular;  Laterality: Left;  SFA   PERIPHERAL VASCULAR ATHERECTOMY  12/06/2020   Procedure: PERIPHERAL VASCULAR ATHERECTOMY;  Surgeon: [name], MD;  Location: [facility] INVASIVE CV LAB;  Service: Cardiovascular;;  Lt Iliac   PERIPHERAL VASCULAR BALLOON ANGIOPLASTY Left 04/13/2017   Procedure: PERIPHERAL VASCULAR BALLOON ANGIOPLASTY;  Surgeon: [name], MD;  Location: [facility] INVASIVE CV LAB;  Service: Cardiovascular;  Laterality: Left;  SFA   PERIPHERAL VASCULAR INTERVENTION  12/06/2020   Procedure: PERIPHERAL VASCULAR INTERVENTION;  Surgeon: [name], MD;  Location: [facility] INVASIVE CV LAB;  Service: Cardiovascular;;  Lt Iliac   RIGHT/LEFT HEART CATH AND CORONARY/GRAFT ANGIOGRAPHY N/A 06/28/2020   Procedure: RIGHT/LEFT HEART CATH AND CORONARY/GRAFT ANGIOGRAPHY;  Surgeon: [name], MD;  Location: [facility] INVASIVE CV LAB;  Service: Cardiovascular;  Laterality: N/A;    ROS:   General:  No weight loss, Fever, chills  HEENT: No recent headaches, no nasal bleeding, no visual changes, no sore throat  Neurologic: No dizziness, blackouts, seizures. No recent symptoms of stroke or mini- stroke. No recent episodes of slurred speech, or temporary blindness.  Cardiac: No recent episodes of chest pain/pressure, no shortness of breath at rest.  No shortness of breath with exertion.  Denies history of atrial fibrillation or irregular heartbeat  Vascular: No history of rest pain in feet.  No history of claudication.  No history of non-healing ulcer, No history of DVT   Pulmonary: No home oxygen, no productive cough, no hemoptysis,  No asthma or wheezing  Musculoskeletal:  [ ]  Arthritis, [x ] Low back pain,  [ ]  Joint pain  Hematologic:No history of hypercoagulable state.  No history of easy bleeding.  No history of anemia  Gastrointestinal: No hematochezia or melena,  No gastroesophageal reflux, no trouble swallowing  Urinary: [ ]  chronic Kidney disease, [ ]  on HD - [ ]  MWF or [ ]  TTHS, [ ]  Burning with urination, [ ]  Frequent urination, [ ]  Difficulty urinating;   Skin: No rashes  Psychological: No history of anxiety,  No history of  depression  Social History Social History   Tobacco Use   Smoking status: Former    Current packs/day: 0.00    Average packs/day: 1 pack/day for 50.0 years (50.0 ttl pk-yrs)    Types: Cigarettes    Start date: 12/18/1954    Quit date: 12/17/2004    Years since quitting: 18.3   Smokeless tobacco: Never  Vaping Use   Vaping status: Never Used  Substance Use Topics   Alcohol use: Yes    Comment: occasionally   Drug use: No    Family History Family History  Problem Relation Age of Onset   Hypertension Mother        77   Clotting disorder Father        1   Diabetes Brother        heart surgery    Allergies  Allergies  Allergen Reactions   Crestor [Rosuvastatin] Other (See Comments)    Myalgias   Lipitor [Atorvastatin] Other (See Comments)    Myalgias    Zestril [Lisinopril] Cough     Current Outpatient Medications  Medication Sig Dispense Refill   amLODipine (NORVASC) 10 MG tablet Take 1 tablet (10 mg total) by mouth daily. 90 tablet 3   aspirin 81 MG tablet  Take 81 mg by mouth daily.     Cholecalciferol (VITAMIN D) 50 MCG (2000 UT) tablet Take 2,000 Units by mouth daily.     clopidogrel (PLAVIX) 75 MG tablet TAKE 1 TABLET BY MOUTH EVERY DAY 90 tablet 3   Coenzyme Q10 (COQ10) 200 MG CAPS Take 200 mg by mouth daily.     doxycycline (VIBRA-TABS) 100 MG tablet Take 1 tablet (100 mg total) by mouth 2 (two) times daily. 20 tablet 0   Evolocumab (REPATHA SURECLICK) 140 MG/ML SOAJ Inject 140 mg into the skin every 14 (fourteen) days. 2 mL 11   ezetimibe (ZETIA) 10 MG tablet Take 1 tablet (10 mg total) by mouth daily. 90 tablet 3   famotidine (PEPCID) 20 MG tablet TAKE 1 TABLET BY MOUTH EVERY DAY 90 tablet 1   isosorbide mononitrate (IMDUR) 60 MG 24 hr tablet Take 1.5 tablets (90 mg total) by mouth daily. 135 tablet 3   levocetirizine (XYZAL) 5 MG tablet TAKE 1 TABLET BY MOUTH EVERY DAY IN THE EVENING 90 tablet 1   metFORMIN (GLUCOPHAGE) 500 MG tablet TAKE 1 TABLET BY  MOUTH 2 TIMES DAILY WITH A MEAL. 180 tablet 3   metoprolol succinate (TOPROL-XL) 50 MG 24 hr tablet TAKE 1 TABLET (50 MG TOTAL) BY MOUTH TWICE A DAY .TAKE WITH OR IMMEDIATELY FOLLOWING A MEAL 180 tablet 3   Omega-3 Fatty Acids (FISH OIL) 1200 MG CAPS Take 2 capsules (2,400 mg total) by mouth every morning.     levofloxacin (LEVAQUIN) 750 MG tablet Take 750 mg by mouth daily. 30 day course. (Patient not taking: Reported on 04/22/2023)     meclizine (ANTIVERT) 12.5 MG tablet Take 1 tablet (12.5 mg total) by mouth 3 (three) times daily as needed for dizziness. (Patient not taking: Reported on 05/06/2023) 8 tablet 0   nitroGLYCERIN (NITROSTAT) 0.4 MG SL tablet Place 1 tablet (0.4 mg total) under the tongue every 5 (five) minutes as needed for chest pain. (Patient not taking: Reported on 04/22/2023) 25 tablet 3   No current facility-administered medications for this visit.    Physical Examination  Vitals:   05/06/23 0830  BP: 124/70  Pulse: 63  Resp: 20  Temp: 98.2 F (36.8 C)  TempSrc: Temporal  SpO2: 95%  Weight: 221 lb (100.2 kg)  Height: 6\' 1"  (1.854 m)    Body mass index is 29.16 kg/m.  General:  Alert and oriented, no acute distress HEENT: Normal Neck: No bruit or JVD Pulmonary: Clear to auscultation bilaterally Cardiac: Regular Rate and Rhythm without murmur Abdomen: Soft, non-tender, non-distended, no mass, no scars Skin: No rash Extremity Pulses:   radial,  femoral pulses bilaterally Musculoskeletal: No deformity or edema  Neurologic: Upper and lower extremity motor 5/5 and symmetric  DATA:   ABI Findings:  +---------+------------------+-----+----------+--------+  Right   Rt Pressure (mmHg)IndexWaveform  Comment   +---------+------------------+-----+----------+--------+  Brachial 140                                        +---------+------------------+-----+----------+--------+  PTA     255               1.82 monophasic           +---------+------------------+-----+----------+--------+  DP      101               0.72 monophasic          +---------+------------------+-----+----------+--------+  Great Toe63                0.45                     +---------+------------------+-----+----------+--------+   +---------+------------------+-----+-----------+-------+  Left    Lt Pressure (mmHg)IndexWaveform   Comment  +---------+------------------+-----+-----------+-------+  Brachial 137                                        +---------+------------------+-----+-----------+-------+  PTA     140               1.00 biphasic            +---------+------------------+-----+-----------+-------+  DP      144               1.03 multiphasic         +---------+------------------+-----+-----------+-------+  Great Toe140               1.00                     +---------+------------------+-----+-----------+-------+   +-------+-----------+-----------+------------+------------+  ABI/TBIToday's ABIToday's TBIPrevious ABIPrevious TBI  +-------+-----------+-----------+------------+------------+  Right [place]         0.45       [place]          0.36          +-------+-----------+-----------+------------+------------+  Left  1.03       1.00       1.06        0.56          +-------+-----------+-----------+------------+------------+       Arterial wall calcification precludes accurate ankle pressures and ABIs.  Bilateral ABIs appear essentially unchanged compared to prior study on  10/29/22.    Summary:  Right: Resting right ankle-brachial index indicates noncompressible right  lower extremity arteries. The right toe-brachial index is abnormal.   Left: Resting left ankle-brachial index is within normal range. The left  toe-brachial index is abnormal.    Left Graft #1: fem- bk pop  +--------------------+--------+--------+---------+--------+                     PSV  cm/sStenosisWaveform Comments  +--------------------+--------+--------+---------+--------+  Inflow             89              biphasic           +--------------------+--------+--------+---------+--------+  Proximal Anastomosis96              triphasic          +--------------------+--------+--------+---------+--------+  Proximal Graft      89              triphasic          +--------------------+--------+--------+---------+--------+  Mid Graft           69              biphasic           +--------------------+--------+--------+---------+--------+  Distal Graft        79              biphasic           +--------------------+--------+--------+---------+--------+  Distal Anastomosis  143             biphasic           +--------------------+--------+--------+---------+--------+  Outflow            80              biphasic           +--------------------+--------+--------+---------+--------+     Summary:  Left: Patent graft with no stenosis.   ASSESSMENT/PLAN:  82 y.o. male here for follow up for PAD. He is s/p :Left common femoral to below-knee popliteal artery bypass with 6 mm ringed PTFE on 01/28/22 by Dr. 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  He did have post op lymphocele but this is now resolved.    The bypass graft is patent with biphasic/triphasic flow. Bilateral ABIs appear essentially unchanged compared to prior study on 10/29/22. He is able to do daily activity without new symptoms.  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  He denies rest pain, non healing wounds and claudication in the calve muscle.  I will have him f/u in 9 months for repeat studies.  He will call if he has concerns.          Mosetta Pigeon PA-C [facility] [place] Office: [phone]  MD in clinic [place]

## 2023-05-11 ENCOUNTER — Other Ambulatory Visit: Payer: Self-pay | Admitting: Cardiovascular Disease

## 2023-05-11 DIAGNOSIS — E785 Hyperlipidemia, unspecified: Secondary | ICD-10-CM

## 2023-05-11 DIAGNOSIS — I25810 Atherosclerosis of coronary artery bypass graft(s) without angina pectoris: Secondary | ICD-10-CM

## 2023-05-11 DIAGNOSIS — I739 Peripheral vascular disease, unspecified: Secondary | ICD-10-CM

## 2023-05-15 ENCOUNTER — Encounter: Payer: Self-pay | Admitting: Medical

## 2023-05-15 ENCOUNTER — Ambulatory Visit (INDEPENDENT_AMBULATORY_CARE_PROVIDER_SITE_OTHER): Payer: Medicare HMO | Admitting: Medical

## 2023-05-15 ENCOUNTER — Other Ambulatory Visit: Payer: Self-pay

## 2023-05-15 VITALS — BP 130/60 | HR 68 | Temp 98.1°F | Resp 18 | Ht 73.0 in | Wt 218.0 lb

## 2023-05-15 DIAGNOSIS — R350 Frequency of micturition: Secondary | ICD-10-CM

## 2023-05-15 DIAGNOSIS — Z7984 Long term (current) use of oral hypoglycemic drugs: Secondary | ICD-10-CM

## 2023-05-15 DIAGNOSIS — E119 Type 2 diabetes mellitus without complications: Secondary | ICD-10-CM

## 2023-05-15 DIAGNOSIS — R0789 Other chest pain: Secondary | ICD-10-CM

## 2023-05-15 DIAGNOSIS — I739 Peripheral vascular disease, unspecified: Secondary | ICD-10-CM

## 2023-05-15 DIAGNOSIS — R059 Cough, unspecified: Secondary | ICD-10-CM

## 2023-05-15 DIAGNOSIS — Z951 Presence of aortocoronary bypass graft: Secondary | ICD-10-CM | POA: Diagnosis not present

## 2023-05-15 DIAGNOSIS — J069 Acute upper respiratory infection, unspecified: Secondary | ICD-10-CM

## 2023-05-15 DIAGNOSIS — I10 Essential (primary) hypertension: Secondary | ICD-10-CM

## 2023-05-15 LAB — CBC WITH DIFFERENTIAL/PLATELET
Basophils Absolute: 0.1 10*3/uL (ref 0.0–0.1)
Basophils Relative: 0.7 % (ref 0.0–3.0)
Eosinophils Absolute: 0.4 10*3/uL (ref 0.0–0.7)
Eosinophils Relative: 3 % (ref 0.0–5.0)
HCT: 39.9 % (ref 39.0–52.0)
Hemoglobin: 13.2 g/dL (ref 13.0–17.0)
Lymphocytes Relative: 16.9 % (ref 12.0–46.0)
Lymphs Abs: 2.1 10*3/uL (ref 0.7–4.0)
MCHC: 33.2 g/dL (ref 30.0–36.0)
MCV: 89.3 fL (ref 78.0–100.0)
Monocytes Absolute: 1.1 10*3/uL — ABNORMAL HIGH (ref 0.1–1.0)
Monocytes Relative: 8.5 % (ref 3.0–12.0)
Neutro Abs: 8.9 10*3/uL — ABNORMAL HIGH (ref 1.4–7.7)
Neutrophils Relative %: 70.9 % (ref 43.0–77.0)
Platelets: 276 10*3/uL (ref 150.0–400.0)
RBC: 4.47 Mil/uL (ref 4.22–5.81)
RDW: 14.2 % (ref 11.5–15.5)
WBC: 12.5 10*3/uL — ABNORMAL HIGH (ref 4.0–10.5)

## 2023-05-15 LAB — COMPREHENSIVE METABOLIC PANEL
ALT: 12 U/L (ref 0–53)
AST: 15 U/L (ref 0–37)
Albumin: 4.4 g/dL (ref 3.5–5.2)
Alkaline Phosphatase: 69 U/L (ref 39–117)
BUN: 20 mg/dL (ref 6–23)
CO2: 28 meq/L (ref 19–32)
Calcium: 9.3 mg/dL (ref 8.4–10.5)
Chloride: 103 meq/L (ref 96–112)
Creatinine, Ser: 0.68 mg/dL (ref 0.40–1.50)
GFR: 87.43 mL/min (ref >60.00)
Glucose, Bld: 114 mg/dL — ABNORMAL HIGH (ref 70–99)
Potassium: 4.3 meq/L (ref 3.5–5.1)
Sodium: 141 meq/L (ref 135–145)
Total Bilirubin: 1.1 mg/dL (ref 0.2–1.2)
Total Protein: 6.8 g/dL (ref 6.0–8.3)

## 2023-05-15 LAB — POC URINALSYSI DIPSTICK (AUTOMATED)
Blood, UA: NEGATIVE
Glucose, UA: NEGATIVE
Ketones, UA: NEGATIVE
Nitrite, UA: NEGATIVE
Protein, UA: NEGATIVE
Spec Grav, UA: 1.02 (ref 1.010–1.025)
Urobilinogen, UA: 0.2 U/dL
pH, UA: 5 (ref 5.0–8.0)

## 2023-05-15 LAB — HEMOGLOBIN A1C: Hgb A1c MFr Bld: 6.8 % — ABNORMAL HIGH (ref 4.6–6.5)

## 2023-05-15 LAB — TROPONIN I (HIGH SENSITIVITY): High Sens Troponin I: 13 ng/L (ref 2–17)

## 2023-05-15 LAB — PSA: PSA: 0.51 ng/mL (ref 0.10–4.00)

## 2023-05-15 MED ORDER — CEPHALEXIN 500 MG PO CAPS: 500.0000 mg | ORAL_CAPSULE | Freq: Two times a day (BID) | ORAL | 0 refills | Status: DC

## 2023-05-15 MED ORDER — BENZONATATE 100 MG PO CAPS: 100.0000 mg | ORAL_CAPSULE | Freq: Three times a day (TID) | ORAL | 0 refills | Status: DC | PRN

## 2023-05-15 MED ORDER — FLUTICASONE PROPIONATE 50 MCG/ACT NA SUSP: 2.0000 | Freq: Every day | NASAL | 1 refills | Status: DC

## 2023-05-15 NOTE — Progress Notes (Signed)
Subjective:    Patient ID: [name], male    DOB: 12-23-1940, 82 y.o.   MRN: [id number]  HPI  Pt in for follow up.  Pt has seen cardiologist early this month.  On review of that note   History of Present Illness: .   [name] is a 82 y.o. 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  Myoview in February 2021 was negative.  Last cardiac catheterization in February 2022 revealed normal EF, left dominant system, patent SVG to distal left PDA, patent LIMA to LAD, 90% apical LAD stenosis after LIMA insertion.  He underwent vascular bypass surgery by Dr. Randie Heinz on 01/28/2022.  Patient returned back to the hospital 5 days after being released from admission of vascular bypass surgery with chest discomfort.  Troponin was mildly elevated.  Patient underwent nuclear stress test on 02/05/2022 which came back intermediate risk with ischemia in a relatively small patch of basal anterior wall and anterior septum, Dr. Royann Shivers suspected this is due to left main coronary artery stenosis feeding a relatively short segment of LAD artery was attached to septals and diagonal with downstream occlusion of the mid LAD.  Area of myocardium was relatively small and risk outweighed the potential benefit to pursue cardiac catheterization.  Echocardiogram showed EF 50-55 % with no regional wall motion abnormality, normal RV, no significant valve disease.  Medical management was recommended.  Beta-blocker was increased.  Hemoglobin was 9.0 at the time of discharge.  I last saw the patient in February 2024 at which time he was not taking his Praluent as due to cost, I discussed with our clinical pharmacist with enrolled him in [facility].   Patient presents today for follow-up.  For the past month, he has been having some left-sided chest pain  radiating to the left shoulder.  This occurs both with exertion and also at rest.  Symptom would last 5 to 10 minutes each before self resolving.  He never took any sublingual nitroglycerin.  EKG showed normal sinus rhythm without significant ST-T wave changes.  He denies any shortness of breath or dizziness.  I recommended a PET stress test and following up in 6 weeks.  He has no lower extremity edema, orthopnea or PND.  ASSESSMENT AND PLAN: .     Chest pain -Intermittent chest pain for the past month, occurs both at rest and with exertion.  Chest pain has been increasing frequency.  Last about 5 to 10 minutes each time. -EKG was normal.  Will proceed with PET stress test.  Patient has been instructed to avoid any caffeinated drinks for 12 hours prior to the test.  N.p.o. in the morning of the test.   Coronary Artery Disease History of CABG in 2006 with recent chest pain for the past month. Last cardiac catheterization in February 2022 showed 90% distal LAD stenosis after the insertion of the LIMA. Stress test in September 2023 showed intermediate risk with ischemia in a relatively small patch of basal anterior wall and anterior septum. Medical management was recommended. -EKG normal.  On aspirin and Plavix   Hypertension: Blood pressure stable.   Hyperlipidemia -Continue on Repatha and Zetia.       Dispo: Follow-up in 6 weeks, earlier if the stress test come back abnormal."  On day of pt last visit see read of  EKG stated  EKG showed normal sinus rhythm without significant ST-T wave changes.   Pt had yet to be contacted for his PET stress test.    Pt also saw vascular surgeon office for his hx of left toe ulcer.  "ASSESSMENT/PLAN:  82 y.o. male here for follow up for PAD. He is s/p :Left common femoral to below-knee popliteal artery bypass with 6 mm ringed PTFE on 01/28/22 by Dr. 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  He did have post  op lymphocele but this is now resolved.     The bypass graft is patent with biphasic/triphasic flow. Bilateral ABIs appear essentially unchanged compared to prior study on 10/29/22. He is able to do daily activity without new symptoms.  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  He denies rest pain, non healing wounds and claudication in the calve muscle.  I will have him f/u in 9 months for repeat studies.  He will call if he has concerns."   Pt states since seeing cardiologist he is having more pain in his chest. He points to left upper chest. He notices more at night.  He had pain last night for 10-15 minutes then pain subsided. Pt states about 4 times pain can be at rest and activity. No jaw pain, no shoulder pain or shortness. Over past month no nitroglycerin use. On discussion he notes can feel pain on palpation.    Pt also reporting Sunday nasal and chest congestion. He is coughing dry cough. No fever,  or sweats. But one chill on Sunday night. No myalgia   Pt is diabetic- last A1c was 6.4.  Also pt reports frequent urination for one year. More so recently. At time up to 4 times.    Review of Systems  Constitutional:  Negative for chills, fatigue and fever.       See hpi  HENT:  Positive for congestion. Negative for ear pain, nosebleeds and sneezing.   Respiratory:  Positive for cough. Negative for shortness of breath and wheezing.   Cardiovascular:  Negative for chest pain and palpitations.       Atypical chest pain last night.   Gastrointestinal:  Negative for abdominal pain, constipation and nausea.  Genitourinary:  Positive for frequency. Negative for dysuria, flank pain and urgency.  Musculoskeletal:  Negative for back pain, myalgias and neck stiffness.       Msk type pain upper pec on paplpation.  Skin:  Negative for rash.    Past Medical History:  Diagnosis Date   Claudication (HCC) 06/27/2011   Left ABIs abnormal values with  mild-moderate arterial insufficiency, right SFA moderate amount of mixed density plaque elevating velocities suggestive 50-69% diameter reduction   Coronary artery disease 06/27/2011   Normal Lexiscan, EF 64, post-stress EF 64, no EKG changes   Diabetes mellitus without complication (HCC)    prediabetic   Hyperlipidemia    Hypertension    Impotence of organic origin    Osteoarthritis    Other testicular hypofunction    Peripheral arterial disease (HCC)    PVD (peripheral vascular disease) (HCC)      Social History   Socioeconomic History   Marital status: Married    Spouse name: cathy   Number of children: 1   Years of education: 12   Highest education level: Not on file  Occupational History   Occupation: retired  Tobacco Use   Smoking status: Former  Current packs/day: 0.00    Average packs/day: 1 pack/day for 50.0 years (50.0 ttl pk-yrs)    Types: Cigarettes    Start date: 12/18/1954    Quit date: 12/17/2004    Years since quitting: 18.4   Smokeless tobacco: Never  Vaping Use   Vaping status: Never Used  Substance and Sexual Activity   Alcohol use: Yes    Comment: occasionally   Drug use: No   Sexual activity: Not on file  Other Topics Concern   Not on file  Social History Narrative   Lives with wife   Caffeine - coffee, 1 cup daily   Social Drivers of Corporate investment banker Strain: Not on file  Food Insecurity: No Food Insecurity (02/04/2022)   Hunger Vital Sign    Worried About Running Out of Food in the Last Year: Never true    Ran Out of Food in the Last Year: Never true  Transportation Needs: No Transportation Needs (02/04/2022)   PRAPARE - Administrator, Civil Service (Medical): No    Lack of Transportation (Non-Medical): No  Physical Activity: Not on file  Stress: Not on file  Social Connections: Not on file  Intimate Partner Violence: Not At Risk (02/05/2022)   Humiliation, Afraid, Rape, and Kick questionnaire    Fear of Current or  Ex-Partner: No    Emotionally Abused: No    Physically Abused: No    Sexually Abused: No    Past Surgical History:  Procedure Laterality Date   ABDOMINAL AORTOGRAM N/A 06/28/2020   Procedure: ABDOMINAL AORTOGRAM;  Surgeon: [name], MD;  Location: [facility] INVASIVE CV LAB;  Service: Cardiovascular;  Laterality: N/A;   ABDOMINAL AORTOGRAM W/LOWER EXTREMITY Left 01/27/2022   Procedure: ABDOMINAL AORTOGRAM W/LOWER EXTREMITY;  Surgeon: Maeola Harman, MD;  Location: [facility] INVASIVE CV LAB;  Service: Cardiovascular;  Laterality: Left;   CARDIAC CATHETERIZATION  12/26/2004   Left main-90% distal tapered stenosis, left circumflex-95% ostial stenosis, first marginal branch 99% ostial branch, posterior descending artery 99% ostial stenosis, 60-70% segmental mid stenosis, needed CABG   CORONARY ARTERY BYPASS GRAFT  12/27/2004   Left main 3-vessel disease, LIMA to LAD, saphenous vein to circumflexmarginal branch and to the PDA   ENDARTERECTOMY FEMORAL Left 01/28/2022   Procedure: LEFT ENDARTERECTOMYCOMMON  [facility] BELOW KNEE POPLITEAL;  Surgeon: Maeola Harman, MD;  Location: [facility] OR;  Service: Vascular;  Laterality: Left;   FEMORAL-POPLITEAL BYPASS GRAFT Left 01/28/2022   Procedure: LEFT FEMORAL-POPLITEAL ARTERY BYPASS;  Surgeon: Maeola Harman, MD;  Location: [facility] OR;  Service: Vascular;  Laterality: Left;   INGUINAL HERNIA REPAIR Left 2005   LOWER EXTREMITY ANGIOGRAPHY N/A 04/13/2017   Procedure: LOWER EXTREMITY ANGIOGRAPHY;  Surgeon: [name], MD;  Location: [facility] INVAS[facility] CV LAB;  Service: Cardiovascular;  Laterality: N/A;   LOWER EXTREMITY ANGIOGRAPHY Bilateral 12/06/2020   Procedure: Lower Extremity Angiography;  Surgeon: [name], MD;  Location: [facility] INVASIVE CV LAB;  Service: Cardiovascular;  Laterality: Bilateral;  Limited Study   LOWER EXTREMITY INTERVENTION N/A 08/04/2016   Procedure: Lower Extremity Intervention;  Surgeon: [name], MD;   Location: [facility] INVASIVE CV LAB;  Service: Cardiovascular;  Laterality: N/A;   PERIPHERAL VASCULAR ATHERECTOMY Left 04/13/2017   Procedure: PERIPHERAL VASCULAR ATHERECTOMY;  Surgeon: [name], MD;  Location: [facility] INVASIVE CV LAB;  Service: Cardiovascular;  Laterality: Left;  SFA   PERIPHERAL VASCULAR ATHERECTOMY  12/06/2020   Procedure: PERIPHERAL VASCULAR ATHERECTOMY;  Surgeon: [name],  MD;  Location: [facility] INVASIVE CV LAB;  Service: Cardiovascular;;  Lt Iliac   PERIPHERAL VASCULAR BALLOON ANGIOPLASTY Left 04/13/2017   Procedure: PERIPHERAL VASCULAR BALLOON ANGIOPLASTY;  Surgeon: [name], MD;  Location: [facility];  Service: Cardiovascular;  Laterality: Left;  SFA   PERIPHERAL VASCULAR INTERVENTION  12/06/2020   Procedure: PERIPHERAL VASCULAR INTERVENTION;  Surgeon: [name], MD;  Location: [facility] INVASIVE CV LAB;  Service: Cardiovascular;;  Lt Iliac   RIGHT/LEFT HEART CATH AND CORONARY/GRAFT ANGIOGRAPHY N/A 06/28/2020   Procedure: RIGHT/LEFT HEART CATH AND CORONARY/GRAFT ANGIOGRAPHY;  Surgeon: [name], MD;  Location: [facility] INVASIVE CV LAB;  Service: Cardiovascular;  Laterality: N/A;    Family History  Problem Relation Age of Onset   Hypertension Mother        5   Clotting disorder Father        63   Diabetes Brother        heart surgery    Allergies  Allergen Reactions   Crestor [Rosuvastatin] Other (See Comments)    Myalgias   Lipitor [Atorvastatin] Other (See Comments)    Myalgias    Zestril [Lisinopril] Cough    Current Outpatient Medications on File Prior to Visit  Medication Sig Dispense Refill   amLODipine (NORVASC) 10 MG tablet Take 1 tablet (10 mg total) by mouth daily. 90 tablet 3   aspirin 81 MG tablet Take 81 mg by mouth daily.     Cholecalciferol (VITAMIN D) 50 MCG (2000 UT) tablet Take 2,000 Units by mouth daily.     clopidogrel (PLAVIX) 75 MG tablet TAKE 1 TABLET BY MOUTH EVERY DAY 90 tablet 3   Coenzyme Q10 (COQ10) 200  MG CAPS Take 200 mg by mouth daily.     Evolocumab (REPATHA SURECLICK) 140 MG/ML SOAJ INJECT 140 MG INTO THE SKIN EVERY 14 (FOURTEEN) DAYS. 6 mL 3   ezetimibe (ZETIA) 10 MG tablet Take 1 tablet (10 mg total) by mouth daily. 90 tablet 3   famotidine (PEPCID) 20 MG tablet TAKE 1 TABLET BY MOUTH EVERY DAY 90 tablet 1   isosorbide mononitrate (IMDUR) 60 MG 24 hr tablet Take 1.5 tablets (90 mg total) by mouth daily. 135 tablet 3   levocetirizine (XYZAL) 5 MG tablet TAKE 1 TABLET BY MOUTH EVERY DAY IN THE EVENING 90 tablet 1   levofloxacin (LEVAQUIN) 750 MG tablet Take 750 mg by mouth daily. 30 day course. (Patient not taking: Reported on 04/22/2023)     meclizine (ANTIVERT) 12.5 MG tablet Take 1 tablet (12.5 mg total) by mouth 3 (three) times daily as needed for dizziness. (Patient not taking: Reported on 05/06/2023) 8 tablet 0   metFORMIN (GLUCOPHAGE) 500 MG tablet TAKE 1 TABLET BY MOUTH 2 TIMES DAILY WITH A MEAL. 180 tablet 3   metoprolol succinate (TOPROL-XL) 50 MG 24 hr tablet TAKE 1 TABLET (50 MG TOTAL) BY MOUTH TWICE A DAY .TAKE WITH OR IMMEDIATELY FOLLOWING A MEAL 180 tablet 3   nitroGLYCERIN (NITROSTAT) 0.4 MG SL tablet Place 1 tablet (0.4 mg total) under the tongue every 5 (five) minutes as needed for chest pain. (Patient not taking: Reported on 04/22/2023) 25 tablet 3   Omega-3 Fatty Acids (FISH OIL) 1200 MG CAPS Take 2 capsules (2,400 mg total) by mouth every morning.     No current facility-administered medications on file prior to visit.    BP 130/60   Pulse 68   Temp 98.1 F (36.7 C)   Resp 18   Ht 6'  1" (1.854 m)   Wt 218 lb (98.9 kg)   SpO2 97%   BMI 28.76 kg/m        Objective:   Physical Exam  General Mental Status- Alert. General Appearance- Not in acute distress.   Skin General: Color- Normal Color. Moisture- Normal Moisture.  Neck Carotid Arteries- Normal color. Moisture- Normal Moisture. No carotid bruits. No JVD.  Chest and Lung Exam Auscultation: Breath  Sounds:-CTA  Cardiovascular Auscultation:Rythm- RRR Murmurs & Other Heart Sounds:Auscultation of the heart reveals- No Murmurs.  Abdomen Inspection:-Inspeection Normal. Palpation/Percussion:Note:No mass. Palpation and Percussion of the abdomen reveal- Non Tender, Non Distended + BS, no rebound or guarding.    Neurologic Cranial Nerve exam:- CN III-XII intact(No nystagmus), symmetric smile. Strength:- 5/5 equal and symmetric strength both upper and lower extremities.   Lower ext- no pedal edema, calfs symmetric,negative homans signs.  Heent- nasal congested, tm normal, posterior pharynx normal and  +pnd.  No sinus pressure.  Anterior thorax- mild left upper pectoralis mild tender to palpation    Assessment & Plan:  Assessment and Plan         Atypical Chest Pain History of CABG, intermittent chest pain 4 times a week over past month. EKG and troponin ordered to rule out cardiac etiology. Patient advised to seek emergency care if chest pain reoccurs or if  becomes constant and severe on reoccurence. -Order EKG and troponin.(EKG nsr. Only artifact on v3. No ischemia seen) -Contact patient if troponin is elevated and advise to go to the emergency department. -Patient to follow up with cardiologist regarding stress test that was to be done but was not ordered. I will also try to send message to that provider.   Musculoskeletal Chest Pain Possible pectoralis muscle pain contributing to chest discomfort. -Advise trial of Tylenol for pain relief.  Diabetes Plan to check metabolic panel and A1c. -Continue Metformin. -Advise low sugar diet.  Hyperlipidemia Patient on Zetia and Repatha. -Continue Zetia and Repatha.  Frequent Urination Plan to check urine sample, culture, and PSA to evaluate for infection or prostate elevation. -Order urine sample, culture, and PSA. Then decide on treatment.  Hypertension Well controlled on current medications. -Continue current blood pressure  medications.  Upper Respiratory Infection Likely viral etiology, but will monitor for worsening symptoms. -Prescribe Flonase for nasal congestion and benzonatate for cough. -Advise patient to update on symptoms by Monday. -If symptoms worsen (increased sinus pain, brown nasal mucus, productive cough), prescribe antibiotic with coverage for sinuses and lungs.   Follow up date to be determined after lab review.  Time spent with patient today was  45 minutes which consisted of chart revdiew, discussing diagnosis, work up treatment and documentation.

## 2023-05-15 NOTE — Patient Instructions (Addendum)
Atypical Chest Pain History of CABG, intermittent chest pain 4 times a week over past month. EKG and troponin ordered to rule out cardiac etiology. Patient advised to seek emergency care if chest pain reoccurs or if  becomes constant and severe on reoccurence. -Order EKG and troponin.(EKG nsr. Only artifact on v3. No ischemia seen) -Contact patient if troponin is elevated and advise to go to the emergency department. -Patient to follow up with cardiologist regarding stress test that was to be done but was not ordered. I will also try to send message to that provider.   Musculoskeletal Chest Pain Possible pectoralis muscle pain contributing to chest discomfort. -Advise trial of Tylenol for pain relief.  Diabetes Plan to check metabolic panel and A1c. -Continue Metformin. -Advise low sugar diet.  Hyperlipidemia Patient on Zetia and Repatha. -Continue Zetia and Repatha.  Frequent Urination Plan to check urine sample, culture, and PSA to evaluate for infection or prostate elevation. -Order urine sample, culture, and PSA. Then decide on treatment.  Hypertension Well controlled on current medications. -Continue current blood pressure medications.  Upper Respiratory Infection Likely viral etiology, but will monitor for worsening symptoms. -Prescribe Flonase for nasal congestion and benzonatate for cough. -Advise patient to update on symptoms by Monday. -If symptoms worsen (increased sinus pain, brown nasal mucus, productive cough), prescribe antibiotic with coverage for sinuses and lungs.   Follow up date to be determined after lab review.

## 2023-05-16 LAB — URINE CULTURE
MICRO NUMBER:: 15894241
Result:: NO GROWTH
SPECIMEN QUALITY:: ADEQUATE

## 2023-05-17 ENCOUNTER — Encounter: Payer: Self-pay | Admitting: Medical

## 2023-05-17 MED ORDER — TAMSULOSIN HCL 0.4 MG PO CAPS: 0.4000 mg | ORAL_CAPSULE | Freq: Every day | ORAL | 0 refills | Status: DC

## 2023-05-17 NOTE — Telephone Encounter (Signed)
Thank you for letting me know. I have messaged our PET stress test coordinator to see what's going on. We will get it scheduled. Thank you again.

## 2023-05-17 NOTE — Telephone Encounter (Signed)
Pt asked me to pass message to you regarding stress testing. He and his wife state no one has called them regarding testing. Thanks for taking care of this mutual patient.

## 2023-05-17 NOTE — Addendum Note (Signed)
Addended by: Gwenevere Abbot on: 05/17/2023 06:10 AM   Modules accepted: Orders

## 2023-05-19 ENCOUNTER — Ambulatory Visit: Payer: Medicare HMO | Admitting: Physician Assistant

## 2023-05-20 ENCOUNTER — Other Ambulatory Visit: Payer: Self-pay | Admitting: Medical

## 2023-06-04 ENCOUNTER — Ambulatory Visit: Payer: Medicare HMO | Admitting: Physician Assistant

## 2023-06-06 ENCOUNTER — Other Ambulatory Visit: Payer: Self-pay | Admitting: Medical

## 2023-06-14 ENCOUNTER — Other Ambulatory Visit: Payer: Self-pay | Admitting: Medical

## 2023-07-01 ENCOUNTER — Other Ambulatory Visit: Payer: Self-pay | Admitting: Medical

## 2023-07-06 ENCOUNTER — Telehealth (HOSPITAL_COMMUNITY): Payer: Self-pay | Admitting: Emergency Medicine

## 2023-07-06 NOTE — Telephone Encounter (Signed)
 Reaching out to patient to offer assistance regarding upcoming cardiac imaging study; pt verbalizes understanding of appt date/time, parking situation and where to check in, pre-test NPO status and medications ordered, and verified current allergies; name and call back number provided for further questions should they arise Rockwell Alexandria RN Navigator Cardiac Imaging [facility] [phone] office [phone] cell

## 2023-07-07 ENCOUNTER — Ambulatory Visit (HOSPITAL_COMMUNITY)
Admission: RE | Admit: 2023-07-07 | Discharge: 2023-07-07 | Disposition: A | Discharge status: Home / Self Care | Payer: Medicare HMO | Source: Ambulatory Visit | Attending: Physician Assistant | Admitting: Physician Assistant

## 2023-07-07 DIAGNOSIS — R079 Chest pain, unspecified: Secondary | ICD-10-CM | POA: Insufficient documentation

## 2023-07-07 LAB — NM PET CT CARDIAC PERFUSION MULTI W/ABSOLUTE BLOODFLOW
MBFR: 1.19
Rest MBF: 0.91 ml/g/min
Rest Nuclear Isotope Dose: 26.1 mCi
ST Depression (mm): 0 mm
Stress MBF: 1.08 ml/g/min
Stress Nuclear Isotope Dose: 26.1 mCi

## 2023-07-07 MED ORDER — RUBIDIUM RB82 GENERATOR (RUBYFILL)
26.1300 | PACK | Freq: Once | INTRAVENOUS | Status: AC
  Administered 2023-07-07: 26.13 via INTRAVENOUS

## 2023-07-07 MED ORDER — REGADENOSON 0.4 MG/5ML IV SOLN
INTRAVENOUS | Status: AC
  Filled 2023-07-07: qty 5

## 2023-07-07 MED ORDER — RUBIDIUM RB82 GENERATOR (RUBYFILL)
26.1200 | PACK | Freq: Once | INTRAVENOUS | Status: AC
  Administered 2023-07-07: 26.12 via INTRAVENOUS

## 2023-07-07 MED ORDER — REGADENOSON 0.4 MG/5ML IV SOLN
0.4000 mg | Freq: Once | INTRAVENOUS | Status: AC
  Administered 2023-07-07: 0.4 mg via INTRAVENOUS

## 2023-07-07 NOTE — Progress Notes (Signed)
 [facility]

## 2023-07-14 ENCOUNTER — Other Ambulatory Visit: Payer: Self-pay | Admitting: Medical

## 2023-07-14 NOTE — Progress Notes (Signed)
 Please arrange a earlier visit for this week. Double book me if needed.

## 2023-07-15 ENCOUNTER — Telehealth: Payer: Self-pay

## 2023-07-15 NOTE — Telephone Encounter (Signed)
-----   Message from [place] sent at 07/09/2023 11:21 AM EST ----- Abnormal stress test, ideally need early follow up. Currently scheduled to see Dr. Allyson Sabal on 3/4, if we can find a earlier slot tomorrow or early next week, that would be more ideal.

## 2023-07-15 NOTE — Telephone Encounter (Signed)
 Spoke with patient wife accepted appointment for patient on 2/28 at 3:35pm

## 2023-07-17 ENCOUNTER — Other Ambulatory Visit: Payer: Self-pay | Admitting: Physician Assistant

## 2023-07-17 ENCOUNTER — Encounter: Payer: Self-pay | Admitting: Physician Assistant

## 2023-07-17 ENCOUNTER — Ambulatory Visit: Payer: Medicare HMO | Attending: Physician Assistant | Admitting: Physician Assistant

## 2023-07-17 VITALS — BP 120/70 | HR 64 | Ht 73.0 in | Wt 224.0 lb

## 2023-07-17 DIAGNOSIS — E785 Hyperlipidemia, unspecified: Secondary | ICD-10-CM

## 2023-07-17 DIAGNOSIS — I10 Essential (primary) hypertension: Secondary | ICD-10-CM

## 2023-07-17 DIAGNOSIS — R9439 Abnormal result of other cardiovascular function study: Secondary | ICD-10-CM

## 2023-07-17 DIAGNOSIS — I739 Peripheral vascular disease, unspecified: Secondary | ICD-10-CM | POA: Diagnosis not present

## 2023-07-17 DIAGNOSIS — I25702 Atherosclerosis of coronary artery bypass graft(s), unspecified, with refractory angina pectoris: Secondary | ICD-10-CM | POA: Diagnosis not present

## 2023-07-17 DIAGNOSIS — Z01818 Encounter for other preprocedural examination: Secondary | ICD-10-CM | POA: Diagnosis not present

## 2023-07-17 NOTE — Progress Notes (Signed)
 Cardiology Office Note:  .   Date:  07/17/2023  ID:  [name], DOB [birth year], MRN [id number] PCP: Richard [facility]  [facility] Providers Cardiologist:  Richard Batty, MD     History of Present Illness: .   [name] is a 83 y.o. 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  Myoview in February 2021 was negative.  Last cardiac catheterization in February 2022 revealed normal EF, left dominant system, patent SVG to distal left PDA, patent LIMA to LAD, 90% apical LAD stenosis after LIMA insertion.  He underwent vascular bypass surgery by Dr. Randie Heinz on 01/28/2022.  Patient returned back to the hospital 5 days after being released from admission of vascular bypass surgery with chest discomfort.  Troponin was mildly elevated.  Patient underwent nuclear stress test on 02/05/2022 which came back intermediate risk with ischemia in a relatively small patch of basal anterior wall and anterior septum, Dr. Royann Shivers suspected this is due to left main coronary artery stenosis feeding a relatively short segment of LAD artery was attached to septals and diagonal with downstream occlusion of the mid LAD.  Area of myocardium was relatively small and risk outweighed the potential benefit to pursue cardiac catheterization.  Echocardiogram showed EF 50-55 % with no regional wall motion abnormality, normal RV, no significant valve disease.  Medical management was recommended.  Beta-blocker was increased.  Hemoglobin was 9.0 at the time of discharge.  I last saw the patient in February 2024 at which time he was not taking his Praluent as due to cost, I discussed with our clinical pharmacist with enrolled him in [facility].   I last saw the patient in December 2024 with chest discomfort, I recommended a PET stress test PET stress test  that was eventually performed on 07/07/2023, this was severely abnormal with large territory of LAD ischemia.  I have forwarded this to Dr. Allyson Sabal who suspect patient will need a cardiac catheterization if still symptomatic.  I brought the patient back early to the clinic today, he continued to have worsening chest discomfort.  With a large territory of ischemia, we recommend proceed with cardiac authorization.  Risk and benefit of the procedure has been discussed with the patient and his wife who are agreeable to proceed.  He will need to hold metformin for 24 hours before and 48 hours after.  He will need a CBC and a basic metabolic panel.  I plan to see the patient back in 2 weeks.  ROS:   He denies palpitations, dyspnea, pnd, orthopnea, n, v, dizziness, syncope, edema, weight gain, or early satiety. All other systems reviewed and are otherwise negative except as noted above.   He complains of worsening chest discomfort.  Studies Reviewed: .        Cardiac Studies & Procedures   ______________________________________________________________________________________________ CARDIAC CATHETERIZATION  CARDIAC CATHETERIZATION 06/28/2020  Narrative Images from the original result were not included.   Ost LM to Mid LM lesion is 90% stenosed.  Ost Cx lesion is 90% stenosed.  Prox Cx lesion is 100% stenosed.  Prox LAD to Mid LAD lesion is 100% stenosed.  Dist LAD lesion is 90% stenosed.  The left ventricular systolic function is normal.  LV end diastolic pressure is normal.  The left ventricular ejection fraction is 55-65% by visual estimate.  [name] is  a 83 y.o. male   [id number] LOCATION:  FACILITY: [facility] PHYSICIAN: [name]   DATE OF PROCEDURE:  06/28/2020  DATE OF DISCHARGE:     CARDIAC CATHETERIZATION    History obtained from chart review.[name] is a 83 y.o.  mildly overweight married Caucasian male whoI last saw him in the office   08/02/2019.  He is accompanied by his wife [name] today.  He is the father of one child, grandfather and one grandchild. I last saw him in the office 09/12/16. 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 [name] December 27, 2004, for left main 3-vessel disease. He had a LIMA to his LAD, a vein to his circumflex marginal branch as well as to the PDA. His other problems include hypertension and hyperlipidemia. He denies chest pain or shortness of breath. He does have left ABI in the 0.85range with a high-frequency signal in his left SFA by duplex ultrasound 10/20/12 but did not claudication at that time. His last Myoview performed 06/27/11 was nonischemic.4 years ago he developed lifestyle limiting bilateral lower extremity claudication. Should be noted that he did stop smoking at the time of his bypass surgery in 2006. He underwent peripheral angiography by myself 08/04/16 revealing a long segment calcified occlusion right SFA and high-grade diffuse calcified left SFA stenosis. I did not think that he could be percutaneously revascularized on the right, and therefore opted for medical therapy. This Pletal has been titrated to 100 mg by mouth twice a day without significant improvement in his claudication symptoms. These are lifestyle limiting. He denies chest pain or shortness of breath. I performed peripheral angiography and endovascular therapy of his diffusely calcified and highly obstructive left SFA using diamondback orbital rotational atherectomy followed by drug-eluting balloon plasty. This left ABI increased from 0.63-.85 and his claudication has significantly improved and were resolved.   His most recent Dopplers performed 04/27/2019 suggest restenosis of his left SFA although he continues to deny claudication.  Denies chest pain or shortness of breath.  He is statin intolerant and was recently approved for Repatha.  Since I saw him in the office a year ago he has had progressive  upper extremity effort angina.  He did have a negative Myoview 07/14/2019.  He saw Corine Shelter, PA-C in the office 04/27/2020.  His Imdur was increased but this is not affected the frequency and/or severity of his symptoms.  He also complains of claudication bilaterally.  He has a known occluded right SFA from prior angiography status post orbital atherectomy, PTA of his subtotally occluded calcified left SFA 04/13/2017.  Recent Dopplers performed 04/26/2020 revealed a left ABI of 0.52 with an occluded left popliteal artery.  Because of ongoing chest pain and shortness of breath he was elected to proceed with outpatient diagnostic coronary angiography to define his anatomy and physiology given that he had 82 year old grafts.  Impression Mr. Sudol had a left dominant system with a 90% distal left main, occluded LAD and circumflex, patent vein to the PDA on the left, patent vein to an OM branch and patent LIMA to the LAD.  He had normal LV function and normal filling pressures.  There is no "culprit lesion" that would explain his chest pain.  Continue medical therapy will be recommended.  A right femoral artery angiogram was performed and a Mynx closure device was deployed successfully in the right common femoral artery and vein.  Pressure was held.  Patient left lab in  stable condition.  He will be gently hydrated, discharged home later this afternoon.  I will see him back in the office in 2 to 3 weeks for follow-up.  [name]. MD, [facility] 06/28/2020 1:17 PM  Findings Coronary Findings Diagnostic  Dominance: Left  Left Main Ost LM to Mid LM lesion is 90% stenosed. The lesion is calcified.  Left Anterior Descending Prox LAD to Mid LAD lesion is 100% stenosed. Dist LAD lesion is 90% stenosed.  Left Circumflex Ost Cx lesion is 90% stenosed. Prox Cx lesion is 100% stenosed.  Graft To LPDA  Graft To 3rd Mrg  LIMA Graft To Mid LAD  Intervention  No interventions have been  documented.   STRESS TESTS  NM PET CT CARDIAC PERFUSION MULTI W/ABSOLUTE BLOODFLOW 07/07/2023  Narrative   Findings are consistent with ischemia. The study is high risk.  There is a large territory of LAD ischemia with stress.  Though a MBFR < 1.2 can represent stress non-responder, the  associated ischemia is a high risk feature.  TID visually present.   LV perfusion is abnormal. There is evidence of ischemia. There is no evidence of infarction. Defect 1: There is a large defect with severe reduction in uptake present in the apical to basal anterior location(s) that is reversible. There is abnormal wall motion in the defect area. Consistent with ischemia.   Rest left ventricular function is normal. Stress left ventricular function is normal. End diastolic cavity size is moderately enlarged. End systolic cavity size is normal.   Myocardial blood flow was computed to be 0.8ml/g/min at rest and 1.78ml/g/min at stress. Global myocardial blood flow reserve was 1.19 and was highly abnormal.   Coronary calcium assessment not performed due to prior revascularization. Aortic atherosclerosis noted.  Venous bypass grafts visualized at the ostia.   Electronically Signed  By: Riley Lam M.D.  CLINICAL DATA:  This over-read does not include interpretation of cardiac or coronary anatomy or pathology. The Cardiac PET CT interpretation by the cardiologist is attached.  COMPARISON:  CT chest angiogram, 02/04/2022  FINDINGS: Cardiovascular: Aortic atherosclerosis. Mild cardiomegaly. Three-vessel coronary artery calcifications status post median sternotomy and CABG. No pericardial effusion.  Limited Mediastinum/Nodes: No enlarged mediastinal, hilar, or axillary lymph nodes. Trachea and esophagus demonstrate no significant findings.  Limited Lungs/Pleura: Emphysema. No pleural effusion or pneumothorax.  Upper Abdomen: No acute abnormality.  Musculoskeletal: No chest wall abnormality. No  acute osseous findings.  IMPRESSION: 1. No acute CT findings of the included chest. 2. Emphysema. 3. Coronary artery disease.   Electronically Signed By: Jearld Lesch M.D. On: 07/07/2023 10:57   ECHOCARDIOGRAM  ECHOCARDIOGRAM COMPLETE 02/05/2022  Narrative ECHOCARDIOGRAM REPORT    Patient Name:   Malique Driskill Date of Exam: 02/05/2022 Medical Rec #:  [id number]   Height:       73.0 in Accession #:    [id number]  Weight:       214.7 lb Date of Birth:  [birth year]  BSA:          2.218 m Patient Age:    80 years    BP:           127/64 mmHg Patient Gender: M           HR:           78 bpm. Exam Location:  Inpatient  Procedure: 2D Echo, Cardiac Doppler and Color Doppler  Indications:    Chest pain  History:        Patient has no  prior history of Echocardiogram examinations. Prior CABG; Risk Factors:Hypertension, Diabetes and Former Smoker. PAD. Orthopnea.  Sonographer:    Ross Ludwig RDCS (AE) Referring Phys: Therisa Doyne   Sonographer Comments: No subcostal window. IMPRESSIONS   1. Left ventricular ejection fraction, by estimation, is 50 to 55%. The left ventricle has low normal function. The left ventricle has no regional wall motion abnormalities. Left ventricular diastolic parameters are indeterminate. 2. Right ventricular systolic function is normal. The right ventricular size is normal. 3. The mitral valve is normal in structure. No evidence of mitral valve regurgitation. No evidence of mitral stenosis. Moderate mitral annular calcification. 4. The aortic valve is grossly normal. There is mild calcification of the aortic valve. There is mild thickening of the aortic valve. Aortic valve regurgitation is not visualized. Aortic valve sclerosis/calcification is present, without any evidence of aortic stenosis.  Comparison(s): No prior Echocardiogram.  Conclusion(s)/Recommendation(s): Otherwise normal echocardiogram, with minor abnormalities described in the  report.  FINDINGS Left Ventricle: Left ventricular ejection fraction, by estimation, is 50 to 55%. The left ventricle has low normal function. The left ventricle has no regional wall motion abnormalities. The left ventricular internal cavity size was normal in size. There is no left ventricular hypertrophy. Abnormal (paradoxical) septal motion consistent with post-operative status. Left ventricular diastolic parameters are indeterminate.  Right Ventricle: The right ventricular size is normal. No increase in right ventricular wall thickness. Right ventricular systolic function is normal.  Left Atrium: Left atrial size was normal in size.  Right Atrium: Right atrial size was normal in size.  Pericardium: There is no evidence of pericardial effusion.  Mitral Valve: The mitral valve is normal in structure. Moderate mitral annular calcification. No evidence of mitral valve regurgitation. No evidence of mitral valve stenosis.  Tricuspid Valve: The tricuspid valve is normal in structure. Tricuspid valve regurgitation is trivial. No evidence of tricuspid stenosis.  Aortic Valve: The aortic valve is grossly normal. There is mild calcification of the aortic valve. There is mild thickening of the aortic valve. Aortic valve regurgitation is not visualized. Aortic valve sclerosis/calcification is present, without any evidence of aortic stenosis. Aortic valve mean gradient measures 4.0 mmHg. Aortic valve peak gradient measures 7.4 mmHg. Aortic valve area, by VTI measures 2.62 cm.  Pulmonic Valve: The pulmonic valve was not well visualized. Pulmonic valve regurgitation is not visualized.  Aorta: The ascending aorta was not well visualized and the aortic root is normal in size and structure.  Venous: The inferior vena cava was not well visualized.  IAS/Shunts: The atrial septum is grossly normal.   LEFT VENTRICLE PLAX 2D LVIDd:         5.30 cm   Diastology LVIDs:         4.10 cm   LV e' medial:     6.53 cm/s LV PW:         1.10 cm   LV E/e' medial:  18.1 LV IVS:        0.90 cm   LV e' lateral:   8.27 cm/s LVOT diam:     2.10 cm   LV E/e' lateral: 14.3 LV SV:         72 LV SV Index:   33 LVOT Area:     3.46 cm   RIGHT VENTRICLE RV Basal diam:  3.50 cm RV S prime:     12.40 cm/s TAPSE (M-mode): 3.0 cm  LEFT ATRIUM             Index  RIGHT ATRIUM           Index LA diam:        4.00 cm 1.80 cm/m   RA Area:     21.50 cm LA Vol (A2C):   45.6 ml 20.56 ml/m  RA Volume:   63.10 ml  28.45 ml/m LA Vol (A4C):   45.5 ml 20.52 ml/m LA Biplane Vol: 50.0 ml 22.55 ml/m AORTIC VALVE AV Area (Vmax):    2.73 cm AV Area (Vmean):   2.47 cm AV Area (VTI):     2.62 cm AV Vmax:           136.00 cm/s AV Vmean:          85.300 cm/s AV VTI:            0.276 m AV Peak Grad:      7.4 mmHg AV Mean Grad:      4.0 mmHg LVOT Vmax:         107.00 cm/s LVOT Vmean:        60.900 cm/s LVOT VTI:          0.209 m LVOT/AV VTI ratio: 0.76  AORTA Ao Root diam: 3.60 cm  MITRAL VALVE MV Area (PHT): 4.54 cm     SHUNTS MV Decel Time: 167 msec     Systemic VTI:  0.21 m MV E velocity: 118.00 cm/s  Systemic Diam: 2.10 cm MV A velocity: 89.60 cm/s MV E/A ratio:  1.32  Jodelle Red MD Electronically signed by Jodelle Red MD Signature Date/Time: 02/05/2022/11:32:23 AM    Final          ______________________________________________________________________________________________      Risk Assessment/Calculations:             Physical Exam:   VS:  BP 120/70 (BP Location: Left Arm, Patient Position: Sitting, Cuff Size: Normal)   Pulse 64   Ht 6\' 1"  (1.854 m)   Wt 224 lb (101.6 kg)   SpO2 95%   BMI 29.55 kg/m    Wt Readings from Last 3 Encounters:  07/17/23 224 lb (101.6 kg)  05/15/23 218 lb (98.9 kg)  05/06/23 221 lb (100.2 kg)    GEN: Well nourished, well developed in no acute distress NECK: No JVD; No carotid bruits CARDIAC: RRR, no murmurs, rubs,  gallops RESPIRATORY:  Clear to auscultation without rales, wheezing or rhonchi  ABDOMEN: Soft, non-tender, non-distended EXTREMITIES:  No edema; No deformity   ASSESSMENT AND PLAN: .    Abnormal stress test: Large territory of ischemia in the anterior wall on the recent stress test.  Talking with the patient today, he continued to have worsening chest discomfort.  PET stress test has been reviewed by Dr. Allyson Sabal as well.  Various options has been discussed with the patient, given large territory of ischemia, we recommended proceed with cardiac catheterization.  Risk and the benefit of the procedure has been discussed with the patient and his wife who is agreeable to proceed.  Hold metformin for 24 hours before and 48 hours after the procedure.  CAD: History of CABG in 2006 with LIMA to LAD, SVG to left circumflex, SVG to PDA.  See #1.  Continue to have worsening chest discomfort.  On aspirin and the Plavix.  Due to large territory of ischemia, I doubt medical therapy will succeed.  PAD: History of femoral-popliteal bypass surgery and a left femoral endarterectomy in September 2023  Hypertension: Blood pressure well-controlled  Hyperlipidemia: His cholesterol is very well-controlled on Repatha  Informed Consent   Shared Decision Making/Informed Consent The risks [stroke (1 in 1000), death (1 in 1000), kidney failure [usually temporary] (1 in 500), bleeding (1 in 200), allergic reaction [possibly serious] (1 in 200)], benefits (diagnostic support and management of coronary artery disease) and alternatives of a cardiac catheterization were discussed in detail with Mr. Sperling and he is willing to proceed.     Dispo: Follow-up in 2 to 3 weeks postprocedure  Signed, Azalee Course, PA

## 2023-07-17 NOTE — Patient Instructions (Signed)
 Medication Instructions:  NO CHANGES *If you need a refill on your cardiac medications before your next appointment, please call your pharmacy*   Lab Work: CBC AND BMP ON MONDAY 07/20/23 If you have labs (blood work) drawn today and your tests are completely normal, you will receive your results only by: MyChart Message (if you have MyChart) OR A paper copy in the mail If you have any lab test that is abnormal or we need to change your treatment, we will call you to review the results.   Testing/Procedures:  [place] A DEPT OF [facility] AT [facility] [address] [place] [address] Dept: [phone] Loc: [phone]  [name]  07/17/2023  You are scheduled for a Cardiac Catheterization on Wednesday, March 3 with Dr. Verne Carrow.  1. Please arrive at the [facility] (Main Entrance A) at [facility]: [address] [place], [place] [address] at 12:30 PM (This time is 2 hour(s) before your procedure to ensure your preparation).   Free valet parking service is available. You will check in at ADMITTING. The support person will be asked to wait in the waiting room.  It is OK to have someone drop you off and come back when you are ready to be discharged.    Special note: Every effort is made to have your procedure done on time. Please understand that emergencies sometimes delay scheduled procedures.  2. Diet: Do not eat solid foods after midnight.  The patient may have clear liquids until 5am upon the day of the procedure.  3. Labs: You will need to have blood drawn on [facility] 07/20/23. You do not need to be fasting.  4. Medication instructions in preparation for your procedure:   Contrast Allergy: No  HOLD METFORMIN FOR 24 HRS PRIOR AND 48 HRS AFTER PROCEDURE  On the morning of your procedure, take your Aspirin 81 mg and any morning medicines NOT listed above.  You may use sips of  water.  5. Plan to go home the same day, you will only stay overnight if medically necessary. 6. Bring a current list of your medications and current insurance cards. 7. You MUST have a responsible person to drive you home. 8. Someone MUST be with you the first 24 hours after you arrive home or your discharge will be delayed. 9. Please wear clothes that are easy to get on and off and wear slip-on shoes.  Thank you for allowing [place] to care for you!   -- [facility]    Follow-Up: At [facility], you and your health needs are our priority.  As part of our continuing mission to provide you with exceptional heart care, we have created designated Provider Care Teams.  These Care Teams include your primary Cardiologist (physician) and Advanced Practice Providers (APPs -  Physician Assistants and Nurse Practitioners) who all work together to provide you with the care you need, when you need it.  We recommend signing up for the patient portal called "MyChart".  Sign up information is provided on this After Visit Summary.  MyChart is used to connect with patients for Virtual Visits (Telemedicine).  Patients are able to view lab/test results, encounter notes, upcoming appointments, etc.  Non-urgent messages can be sent to your provider as well.   To learn more about what you can do with MyChart, go to [email or web address].    Your next appointment:   2 week(s)  AFTER HEART CATH  Provider:   Nanetta Batty, MD     Other Instructions

## 2023-07-17 NOTE — H&P (View-Only) (Signed)
 Cardiology Office Note:  .   Date:  07/17/2023  ID:  [name], DOB 09-16-1940, MRN [id number] PCP: Marisue Brooklyn  [facility] Providers Cardiologist:  Nanetta Batty, MD     History of Present Illness: .   [name] is a 83 y.o. 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  Myoview in February 2021 was negative.  Last cardiac catheterization in February 2022 revealed normal EF, left dominant system, patent SVG to distal left PDA, patent LIMA to LAD, 90% apical LAD stenosis after LIMA insertion.  He underwent vascular bypass surgery by Dr. Randie Heinz on 01/28/2022.  Patient returned back to the hospital 5 days after being released from admission of vascular bypass surgery with chest discomfort.  Troponin was mildly elevated.  Patient underwent nuclear stress test on 02/05/2022 which came back intermediate risk with ischemia in a relatively small patch of basal anterior wall and anterior septum, Dr. Royann Shivers suspected this is due to left main coronary artery stenosis feeding a relatively short segment of LAD artery was attached to septals and diagonal with downstream occlusion of the mid LAD.  Area of myocardium was relatively small and risk outweighed the potential benefit to pursue cardiac catheterization.  Echocardiogram showed EF 50-55 % with no regional wall motion abnormality, normal RV, no significant valve disease.  Medical management was recommended.  Beta-blocker was increased.  Hemoglobin was 9.0 at the time of discharge.  I last saw the patient in February 2024 at which time he was not taking his Praluent as due to cost, I discussed with our clinical pharmacist with enrolled him in [facility].   I last saw the patient in December 2024 with chest discomfort, I recommended a PET stress test PET stress test  that was eventually performed on 07/07/2023, this was severely abnormal with large territory of LAD ischemia.  I have forwarded this to Dr. Allyson Sabal who suspect patient will need a cardiac catheterization if still symptomatic.  I brought the patient back early to the clinic today, he continued to have worsening chest discomfort.  With a large territory of ischemia, we recommend proceed with cardiac authorization.  Risk and benefit of the procedure has been discussed with the patient and his wife who are agreeable to proceed.  He will need to hold metformin for 24 hours before and 48 hours after.  He will need a CBC and a basic metabolic panel.  I plan to see the patient back in 2 weeks.  ROS:   He denies palpitations, dyspnea, pnd, orthopnea, n, v, dizziness, syncope, edema, weight gain, or early satiety. All other systems reviewed and are otherwise negative except as noted above.   He complains of worsening chest discomfort.  Studies Reviewed: .        Cardiac Studies & Procedures   ______________________________________________________________________________________________ CARDIAC CATHETERIZATION  CARDIAC CATHETERIZATION 06/28/2020  Narrative Images from the original result were not included.   Ost LM to Mid LM lesion is 90% stenosed.  Ost Cx lesion is 90% stenosed.  Prox Cx lesion is 100% stenosed.  Prox LAD to Mid LAD lesion is 100% stenosed.  Dist LAD lesion is 90% stenosed.  The left ventricular systolic function is normal.  LV end diastolic pressure is normal.  The left ventricular ejection fraction is 55-65% by visual estimate.  [name] is  a 83 y.o. male   [id number] LOCATION:  FACILITY: [facility] PHYSICIAN: Nanetta Batty, M.D. August 19, 1940   DATE OF PROCEDURE:  06/28/2020  DATE OF DISCHARGE:     CARDIAC CATHETERIZATION    History obtained from chart review.[name] is a 83 y.o.  mildly overweight married Caucasian male whoI last saw him in the office   08/02/2019.  He is accompanied by his wife [name] today.  He is the father of one child, grandfather and one grandchild. I last saw him in the office 09/12/16. 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 [name] December 27, 2004, for left main 3-vessel disease. He had a LIMA to his LAD, a vein to his circumflex marginal branch as well as to the PDA. His other problems include hypertension and hyperlipidemia. He denies chest pain or shortness of breath. He does have left ABI in the 0.85range with a high-frequency signal in his left SFA by duplex ultrasound 10/20/12 but did not claudication at that time. His last Myoview performed 06/27/11 was nonischemic.4 years ago he developed lifestyle limiting bilateral lower extremity claudication. Should be noted that he did stop smoking at the time of his bypass surgery in 2006. He underwent peripheral angiography by myself 08/04/16 revealing a long segment calcified occlusion right SFA and high-grade diffuse calcified left SFA stenosis. I did not think that he could be percutaneously revascularized on the right, and therefore opted for medical therapy. This Pletal has been titrated to 100 mg by mouth twice a day without significant improvement in his claudication symptoms. These are lifestyle limiting. He denies chest pain or shortness of breath. I performed peripheral angiography and endovascular therapy of his diffusely calcified and highly obstructive left SFA using diamondback orbital rotational atherectomy followed by drug-eluting balloon plasty. This left ABI increased from 0.63-.85 and his claudication has significantly improved and were resolved.   His most recent Dopplers performed 04/27/2019 suggest restenosis of his left SFA although he continues to deny claudication.  Denies chest pain or shortness of breath.  He is statin intolerant and was recently approved for Repatha.  Since I saw him in the office a year ago he has had progressive  upper extremity effort angina.  He did have a negative Myoview 07/14/2019.  He saw Corine Shelter, PA-C in the office 04/27/2020.  His Imdur was increased but this is not affected the frequency and/or severity of his symptoms.  He also complains of claudication bilaterally.  He has a known occluded right SFA from prior angiography status post orbital atherectomy, PTA of his subtotally occluded calcified left SFA 04/13/2017.  Recent Dopplers performed 04/26/2020 revealed a left ABI of 0.52 with an occluded left popliteal artery.  Because of ongoing chest pain and shortness of breath he was elected to proceed with outpatient diagnostic coronary angiography to define his anatomy and physiology given that he had 82 year old grafts.  Impression Mr. Sudol had a left dominant system with a 90% distal left main, occluded LAD and circumflex, patent vein to the PDA on the left, patent vein to an OM branch and patent LIMA to the LAD.  He had normal LV function and normal filling pressures.  There is no "culprit lesion" that would explain his chest pain.  Continue medical therapy will be recommended.  A right femoral artery angiogram was performed and a Mynx closure device was deployed successfully in the right common femoral artery and vein.  Pressure was held.  Patient left lab in  stable condition.  He will be gently hydrated, discharged home later this afternoon.  I will see him back in the office in 2 to 3 weeks for follow-up.  Nanetta Batty. MD, [facility] 06/28/2020 1:17 PM  Findings Coronary Findings Diagnostic  Dominance: Left  Left Main Ost LM to Mid LM lesion is 90% stenosed. The lesion is calcified.  Left Anterior Descending Prox LAD to Mid LAD lesion is 100% stenosed. Dist LAD lesion is 90% stenosed.  Left Circumflex Ost Cx lesion is 90% stenosed. Prox Cx lesion is 100% stenosed.  Graft To LPDA  Graft To 3rd Mrg  LIMA Graft To Mid LAD  Intervention  No interventions have been  documented.   STRESS TESTS  NM PET CT CARDIAC PERFUSION MULTI W/ABSOLUTE BLOODFLOW 07/07/2023  Narrative   Findings are consistent with ischemia. The study is high risk.  There is a large territory of LAD ischemia with stress.  Though a MBFR < 1.2 can represent stress non-responder, the  associated ischemia is a high risk feature.  TID visually present.   LV perfusion is abnormal. There is evidence of ischemia. There is no evidence of infarction. Defect 1: There is a large defect with severe reduction in uptake present in the apical to basal anterior location(s) that is reversible. There is abnormal wall motion in the defect area. Consistent with ischemia.   Rest left ventricular function is normal. Stress left ventricular function is normal. End diastolic cavity size is moderately enlarged. End systolic cavity size is normal.   Myocardial blood flow was computed to be 0.8ml/g/min at rest and 1.78ml/g/min at stress. Global myocardial blood flow reserve was 1.19 and was highly abnormal.   Coronary calcium assessment not performed due to prior revascularization. Aortic atherosclerosis noted.  Venous bypass grafts visualized at the ostia.   Electronically Signed  By: Riley Lam M.D.  CLINICAL DATA:  This over-read does not include interpretation of cardiac or coronary anatomy or pathology. The Cardiac PET CT interpretation by the cardiologist is attached.  COMPARISON:  CT chest angiogram, 02/04/2022  FINDINGS: Cardiovascular: Aortic atherosclerosis. Mild cardiomegaly. Three-vessel coronary artery calcifications status post median sternotomy and CABG. No pericardial effusion.  Limited Mediastinum/Nodes: No enlarged mediastinal, hilar, or axillary lymph nodes. Trachea and esophagus demonstrate no significant findings.  Limited Lungs/Pleura: Emphysema. No pleural effusion or pneumothorax.  Upper Abdomen: No acute abnormality.  Musculoskeletal: No chest wall abnormality. No  acute osseous findings.  IMPRESSION: 1. No acute CT findings of the included chest. 2. Emphysema. 3. Coronary artery disease.   Electronically Signed By: Jearld Lesch M.D. On: 07/07/2023 10:57   ECHOCARDIOGRAM  ECHOCARDIOGRAM COMPLETE 02/05/2022  Narrative ECHOCARDIOGRAM REPORT    Patient Name:   Malique Driskill Date of Exam: 02/05/2022 Medical Rec #:  [id number]   Height:       73.0 in Accession #:    [id number]  Weight:       214.7 lb Date of Birth:  13-Apr-1941  BSA:          2.218 m Patient Age:    80 years    BP:           127/64 mmHg Patient Gender: M           HR:           78 bpm. Exam Location:  Inpatient  Procedure: 2D Echo, Cardiac Doppler and Color Doppler  Indications:    Chest pain  History:        Patient has no  prior history of Echocardiogram examinations. Prior CABG; Risk Factors:Hypertension, Diabetes and Former Smoker. PAD. Orthopnea.  Sonographer:    Ross Ludwig RDCS (AE) Referring Phys: Therisa Doyne   Sonographer Comments: No subcostal window. IMPRESSIONS   1. Left ventricular ejection fraction, by estimation, is 50 to 55%. The left ventricle has low normal function. The left ventricle has no regional wall motion abnormalities. Left ventricular diastolic parameters are indeterminate. 2. Right ventricular systolic function is normal. The right ventricular size is normal. 3. The mitral valve is normal in structure. No evidence of mitral valve regurgitation. No evidence of mitral stenosis. Moderate mitral annular calcification. 4. The aortic valve is grossly normal. There is mild calcification of the aortic valve. There is mild thickening of the aortic valve. Aortic valve regurgitation is not visualized. Aortic valve sclerosis/calcification is present, without any evidence of aortic stenosis.  Comparison(s): No prior Echocardiogram.  Conclusion(s)/Recommendation(s): Otherwise normal echocardiogram, with minor abnormalities described in the  report.  FINDINGS Left Ventricle: Left ventricular ejection fraction, by estimation, is 50 to 55%. The left ventricle has low normal function. The left ventricle has no regional wall motion abnormalities. The left ventricular internal cavity size was normal in size. There is no left ventricular hypertrophy. Abnormal (paradoxical) septal motion consistent with post-operative status. Left ventricular diastolic parameters are indeterminate.  Right Ventricle: The right ventricular size is normal. No increase in right ventricular wall thickness. Right ventricular systolic function is normal.  Left Atrium: Left atrial size was normal in size.  Right Atrium: Right atrial size was normal in size.  Pericardium: There is no evidence of pericardial effusion.  Mitral Valve: The mitral valve is normal in structure. Moderate mitral annular calcification. No evidence of mitral valve regurgitation. No evidence of mitral valve stenosis.  Tricuspid Valve: The tricuspid valve is normal in structure. Tricuspid valve regurgitation is trivial. No evidence of tricuspid stenosis.  Aortic Valve: The aortic valve is grossly normal. There is mild calcification of the aortic valve. There is mild thickening of the aortic valve. Aortic valve regurgitation is not visualized. Aortic valve sclerosis/calcification is present, without any evidence of aortic stenosis. Aortic valve mean gradient measures 4.0 mmHg. Aortic valve peak gradient measures 7.4 mmHg. Aortic valve area, by VTI measures 2.62 cm.  Pulmonic Valve: The pulmonic valve was not well visualized. Pulmonic valve regurgitation is not visualized.  Aorta: The ascending aorta was not well visualized and the aortic root is normal in size and structure.  Venous: The inferior vena cava was not well visualized.  IAS/Shunts: The atrial septum is grossly normal.   LEFT VENTRICLE PLAX 2D LVIDd:         5.30 cm   Diastology LVIDs:         4.10 cm   LV e' medial:     6.53 cm/s LV PW:         1.10 cm   LV E/e' medial:  18.1 LV IVS:        0.90 cm   LV e' lateral:   8.27 cm/s LVOT diam:     2.10 cm   LV E/e' lateral: 14.3 LV SV:         72 LV SV Index:   33 LVOT Area:     3.46 cm   RIGHT VENTRICLE RV Basal diam:  3.50 cm RV S prime:     12.40 cm/s TAPSE (M-mode): 3.0 cm  LEFT ATRIUM             Index  RIGHT ATRIUM           Index LA diam:        4.00 cm 1.80 cm/m   RA Area:     21.50 cm LA Vol (A2C):   45.6 ml 20.56 ml/m  RA Volume:   63.10 ml  28.45 ml/m LA Vol (A4C):   45.5 ml 20.52 ml/m LA Biplane Vol: 50.0 ml 22.55 ml/m AORTIC VALVE AV Area (Vmax):    2.73 cm AV Area (Vmean):   2.47 cm AV Area (VTI):     2.62 cm AV Vmax:           136.00 cm/s AV Vmean:          85.300 cm/s AV VTI:            0.276 m AV Peak Grad:      7.4 mmHg AV Mean Grad:      4.0 mmHg LVOT Vmax:         107.00 cm/s LVOT Vmean:        60.900 cm/s LVOT VTI:          0.209 m LVOT/AV VTI ratio: 0.76  AORTA Ao Root diam: 3.60 cm  MITRAL VALVE MV Area (PHT): 4.54 cm     SHUNTS MV Decel Time: 167 msec     Systemic VTI:  0.21 m MV E velocity: 118.00 cm/s  Systemic Diam: 2.10 cm MV A velocity: 89.60 cm/s MV E/A ratio:  1.32  Jodelle Red MD Electronically signed by Jodelle Red MD Signature Date/Time: 02/05/2022/11:32:23 AM    Final          ______________________________________________________________________________________________      Risk Assessment/Calculations:             Physical Exam:   VS:  BP 120/70 (BP Location: Left Arm, Patient Position: Sitting, Cuff Size: Normal)   Pulse 64   Ht 6\' 1"  (1.854 m)   Wt 224 lb (101.6 kg)   SpO2 95%   BMI 29.55 kg/m    Wt Readings from Last 3 Encounters:  07/17/23 224 lb (101.6 kg)  05/15/23 218 lb (98.9 kg)  05/06/23 221 lb (100.2 kg)    GEN: Well nourished, well developed in no acute distress NECK: No JVD; No carotid bruits CARDIAC: RRR, no murmurs, rubs,  gallops RESPIRATORY:  Clear to auscultation without rales, wheezing or rhonchi  ABDOMEN: Soft, non-tender, non-distended EXTREMITIES:  No edema; No deformity   ASSESSMENT AND PLAN: .    Abnormal stress test: Large territory of ischemia in the anterior wall on the recent stress test.  Talking with the patient today, he continued to have worsening chest discomfort.  PET stress test has been reviewed by Dr. Allyson Sabal as well.  Various options has been discussed with the patient, given large territory of ischemia, we recommended proceed with cardiac catheterization.  Risk and the benefit of the procedure has been discussed with the patient and his wife who is agreeable to proceed.  Hold metformin for 24 hours before and 48 hours after the procedure.  CAD: History of CABG in 2006 with LIMA to LAD, SVG to left circumflex, SVG to PDA.  See #1.  Continue to have worsening chest discomfort.  On aspirin and the Plavix.  Due to large territory of ischemia, I doubt medical therapy will succeed.  PAD: History of femoral-popliteal bypass surgery and a left femoral endarterectomy in September 2023  Hypertension: Blood pressure well-controlled  Hyperlipidemia: His cholesterol is very well-controlled on Repatha  Informed Consent   Shared Decision Making/Informed Consent The risks [stroke (1 in 1000), death (1 in 1000), kidney failure [usually temporary] (1 in 500), bleeding (1 in 200), allergic reaction [possibly serious] (1 in 200)], benefits (diagnostic support and management of coronary artery disease) and alternatives of a cardiac catheterization were discussed in detail with Mr. Sperling and he is willing to proceed.     Dispo: Follow-up in 2 to 3 weeks postprocedure  Signed, Azalee Course, PA

## 2023-07-20 LAB — CBC

## 2023-07-21 ENCOUNTER — Telehealth: Payer: Self-pay | Admitting: *Deleted

## 2023-07-21 ENCOUNTER — Ambulatory Visit: Payer: Medicare HMO | Admitting: Cardiovascular Disease

## 2023-07-21 LAB — BASIC METABOLIC PANEL
BUN/Creatinine Ratio: 20 (ref 10–24)
BUN: 15 mg/dL (ref 8–27)
CO2: 21 mmol/L (ref 20–29)
Calcium: 9.4 mg/dL (ref 8.6–10.2)
Chloride: 105 mmol/L (ref 96–106)
Creatinine, Ser: 0.75 mg/dL — ABNORMAL LOW (ref 0.76–1.27)
Glucose: 113 mg/dL — ABNORMAL HIGH (ref 70–99)
Potassium: 4.4 mmol/L (ref 3.5–5.2)
Sodium: 141 mmol/L (ref 134–144)
eGFR: 91 mL/min/{1.73_m2} (ref >59)

## 2023-07-21 LAB — CBC
Hematocrit: 39.1 % (ref 37.5–51.0)
Hemoglobin: 13.1 g/dL (ref 13.0–17.7)
MCH: 29.7 pg (ref 26.6–33.0)
MCHC: 33.5 g/dL (ref 31.5–35.7)
MCV: 89 fL (ref 79–97)
Platelets: 263 10*3/uL (ref 150–450)
RBC: 4.41 x10E6/uL (ref 4.14–5.80)
RDW: 13.2 % (ref 11.6–15.4)
WBC: 10.2 10*3/uL (ref 3.4–10.8)

## 2023-07-21 NOTE — Telephone Encounter (Signed)
 Cardiac Catheterization scheduled at [facility] for: Wednesday July 22, 2023 2:30 PM Arrival time [facility] Main Entrance A at: 12:30 PM  Nothing to eat after midnight prior to procedure, clear liquids until 5 AM day of procedure.  Medication instructions: -Hold:  Metformin-day of procedure and 48 hours post procedure -Other usual morning medications can be taken with sips of water including aspirin 81 mg and Plavix 75 mg  Plan to go home the same day, you will only stay overnight if medically necessary.  You must have responsible adult to drive you home.  Someone must be with you the first 24 hours after you arrive home.  Reviewed procedure instructions with patient.

## 2023-07-23 ENCOUNTER — Telehealth: Payer: Self-pay | Admitting: *Deleted

## 2023-07-23 NOTE — Telephone Encounter (Signed)
 Cardiac Catheterization scheduled at [facility] for: Monday July 27, 2023 12 noon Arrival time [facility] Main Entrance A at: 10 AM  Nothing to eat after midnight prior to procedure, clear liquids until 5 AM day of procedure.  Medication instructions: -Hold:  Metformin-day of procedure and 48 hours post procedure -Other usual morning medications can be taken with sips of water including aspirin 81 mg and Plavix 75 mg.  Plan to go home the same day, you will only stay overnight if medically necessary.  You must have responsible adult to drive you home.  Someone must be with you the first 24 hours after you arrive home.  Reviewed with patient's wife (DPR).

## 2023-07-27 ENCOUNTER — Ambulatory Visit (HOSPITAL_COMMUNITY)
Admission: RE | Admit: 2023-07-27 | Discharge: 2023-07-27 | Disposition: A | Discharge status: Home / Self Care | Payer: Medicare HMO | Attending: Internal Medicine | Admitting: Internal Medicine

## 2023-07-27 ENCOUNTER — Ambulatory Visit (HOSPITAL_COMMUNITY)
Admission: RE | Disposition: A | Discharge status: Home / Self Care | Payer: Self-pay | Source: Home / Self Care | Attending: Internal Medicine

## 2023-07-27 ENCOUNTER — Other Ambulatory Visit: Payer: Self-pay

## 2023-07-27 DIAGNOSIS — I2582 Chronic total occlusion of coronary artery: Secondary | ICD-10-CM | POA: Diagnosis not present

## 2023-07-27 DIAGNOSIS — Z79899 Other long term (current) drug therapy: Secondary | ICD-10-CM | POA: Diagnosis not present

## 2023-07-27 DIAGNOSIS — Z87891 Personal history of nicotine dependence: Secondary | ICD-10-CM | POA: Insufficient documentation

## 2023-07-27 DIAGNOSIS — Z7902 Long term (current) use of antithrombotics/antiplatelets: Secondary | ICD-10-CM | POA: Diagnosis not present

## 2023-07-27 DIAGNOSIS — I2584 Coronary atherosclerosis due to calcified coronary lesion: Secondary | ICD-10-CM | POA: Insufficient documentation

## 2023-07-27 DIAGNOSIS — E785 Hyperlipidemia, unspecified: Secondary | ICD-10-CM | POA: Diagnosis not present

## 2023-07-27 DIAGNOSIS — I25110 Atherosclerotic heart disease of native coronary artery with unstable angina pectoris: Secondary | ICD-10-CM | POA: Insufficient documentation

## 2023-07-27 DIAGNOSIS — Z7982 Long term (current) use of aspirin: Secondary | ICD-10-CM | POA: Diagnosis not present

## 2023-07-27 DIAGNOSIS — Z951 Presence of aortocoronary bypass graft: Secondary | ICD-10-CM | POA: Insufficient documentation

## 2023-07-27 DIAGNOSIS — I739 Peripheral vascular disease, unspecified: Secondary | ICD-10-CM | POA: Diagnosis not present

## 2023-07-27 DIAGNOSIS — R9439 Abnormal result of other cardiovascular function study: Secondary | ICD-10-CM | POA: Diagnosis present

## 2023-07-27 DIAGNOSIS — I10 Essential (primary) hypertension: Secondary | ICD-10-CM | POA: Insufficient documentation

## 2023-07-27 HISTORY — PX: LEFT HEART CATH AND CORS/GRAFTS ANGIOGRAPHY: CATH118250

## 2023-07-27 LAB — GLUCOSE, CAPILLARY
Glucose-Capillary: 117 mg/dL — ABNORMAL HIGH (ref 70–99)
Glucose-Capillary: 100 mg/dL — ABNORMAL HIGH (ref 70–99)

## 2023-07-27 SURGERY — LEFT HEART CATH AND CORS/GRAFTS ANGIOGRAPHY
Anesthesia: LOCAL

## 2023-07-27 MED ORDER — MIDAZOLAM HCL 2 MG/2ML IJ SOLN
INTRAMUSCULAR | Status: DC | PRN
  Administered 2023-07-27 (×2): 1 mg via INTRAVENOUS

## 2023-07-27 MED ORDER — SODIUM CHLORIDE 0.9 % WEIGHT BASED INFUSION: 3.0000 mL/kg/h | INTRAVENOUS | Status: AC

## 2023-07-27 MED ORDER — SODIUM CHLORIDE 0.9 % WEIGHT BASED INFUSION: 1.0000 mL/kg/h | INTRAVENOUS | Status: DC

## 2023-07-27 MED ORDER — NITROGLYCERIN 0.4 MG SL SUBL: 0.4000 mg | SUBLINGUAL_TABLET | SUBLINGUAL | Status: DC | PRN

## 2023-07-27 MED ORDER — LIDOCAINE HCL (PF) 1 % IJ SOLN
INTRAMUSCULAR | Status: AC
  Filled 2023-07-27: qty 30

## 2023-07-27 MED ORDER — ASPIRIN 81 MG PO CHEW: 81.0000 mg | CHEWABLE_TABLET | ORAL | Status: DC

## 2023-07-27 MED ORDER — HEPARIN (PORCINE) IN NACL 1000-0.9 UT/500ML-% IV SOLN
INTRAVENOUS | Status: DC | PRN
  Administered 2023-07-27 (×2): 500 mL

## 2023-07-27 MED ORDER — VERAPAMIL HCL 2.5 MG/ML IV SOLN
INTRAVENOUS | Status: DC | PRN
  Administered 2023-07-27: 10 mL via INTRA_ARTERIAL
  Administered 2023-07-27: 2 mL via INTRA_ARTERIAL

## 2023-07-27 MED ORDER — FENTANYL CITRATE (PF) 100 MCG/2ML IJ SOLN
INTRAMUSCULAR | Status: DC | PRN
  Administered 2023-07-27 (×2): 25 ug via INTRAVENOUS

## 2023-07-27 MED ORDER — IOHEXOL 350 MG/ML SOLN
INTRAVENOUS | Status: DC | PRN
  Administered 2023-07-27: 85 mL

## 2023-07-27 MED ORDER — HEPARIN SODIUM (PORCINE) 1000 UNIT/ML IJ SOLN
INTRAMUSCULAR | Status: AC
  Filled 2023-07-27: qty 10

## 2023-07-27 MED ORDER — MIDAZOLAM HCL 2 MG/2ML IJ SOLN
INTRAMUSCULAR | Status: AC
  Filled 2023-07-27: qty 2

## 2023-07-27 MED ORDER — LIDOCAINE HCL (PF) 1 % IJ SOLN
INTRAMUSCULAR | Status: DC | PRN
  Administered 2023-07-27: 2 mL

## 2023-07-27 MED ORDER — HEPARIN SODIUM (PORCINE) 1000 UNIT/ML IJ SOLN
INTRAMUSCULAR | Status: DC | PRN
  Administered 2023-07-27: 5000 [IU] via INTRAVENOUS

## 2023-07-27 MED ORDER — NITROGLYCERIN 0.4 MG SL SUBL
SUBLINGUAL_TABLET | SUBLINGUAL | Status: AC
  Filled 2023-07-27: qty 1

## 2023-07-27 MED ORDER — ISOSORBIDE MONONITRATE ER 120 MG PO TB24: 120.0000 mg | ORAL_TABLET | Freq: Every day | ORAL | 5 refills | Status: DC

## 2023-07-27 MED ORDER — VERAPAMIL HCL 2.5 MG/ML IV SOLN
INTRAVENOUS | Status: AC
Start: 2023-07-27 — End: ?
  Filled 2023-07-27: qty 2

## 2023-07-27 MED ORDER — FENTANYL CITRATE (PF) 100 MCG/2ML IJ SOLN
INTRAMUSCULAR | Status: AC
  Filled 2023-07-27: qty 2

## 2023-07-27 SURGICAL SUPPLY — 12 items
CATH INFINITI 5 FR IM (CATHETERS) IMPLANT
CATH INFINITI 5 FR MPA2 (CATHETERS) IMPLANT
CATH INFINITI 5FR AL1 (CATHETERS) IMPLANT
CATH INFINITI 5FR JL4 (CATHETERS) IMPLANT
DEVICE RAD COMP TR BAND LRG (VASCULAR PRODUCTS) IMPLANT
ELECT DEFIB PAD ADLT CADENCE (PAD) IMPLANT
GLIDESHEATH SLEND SS 6F .021 (SHEATH) IMPLANT
GUIDEWIRE INQWIRE 1.5J.035X260 (WIRE) IMPLANT
INQWIRE 1.5J .035X260CM (WIRE) ×1 IMPLANT
KIT SINGLE USE MANIFOLD (KITS) IMPLANT
PACK CARDIAC CATHETERIZATION (CUSTOM PROCEDURE TRAY) ×1 IMPLANT
SET ATX-X65L (MISCELLANEOUS) IMPLANT

## 2023-07-27 NOTE — Interval H&P Note (Signed)
 History and Physical Interval Note:  07/27/2023 1:12 PM  [name]  has presented today for surgery, with the diagnosis of coronary artery disease with accelerating angina and abnormal stress test.  The various methods of treatment have been discussed with the patient and family. After consideration of risks, benefits and other options for treatment, the patient has consented to  Procedure(s): LEFT HEART CATH AND CORS/GRAFTS ANGIOGRAPHY (N/A) as a surgical intervention.  The patient's history has been reviewed, patient examined, no change in status, stable for surgery.  I have reviewed the patient's chart and labs.  Questions were answered to the patient's satisfaction.    Cath Lab Visit (complete for each Cath Lab visit)  Clinical Evaluation Leading to the Procedure:   ACS: No.  Non-ACS:    Anginal Classification: CCS III  Anti-ischemic medical therapy: Maximal Therapy (2 or more classes of medications)  Non-Invasive Test Results: High-risk stress test findings: cardiac mortality >3%/year  Prior CABG: Previous CABG  [name]

## 2023-07-27 NOTE — Progress Notes (Signed)
 TR band removed at 1705, gauze dressing applied. Left radial level 0, clean, dry, and intact.

## 2023-07-27 NOTE — Progress Notes (Signed)
 Patient stated he had some jaw pain, MD notified and at bedside. EKG ordered and done. MD is okay with discharge

## 2023-07-27 NOTE — Brief Op Note (Signed)
 BRIEF CARDIAC CATHETERIZATION NOTE  07/27/2023  3:41 PM  PATIENT:  [name]  83 y.o. male  PRE-OPERATIVE DIAGNOSIS:  Accelerating angina abnormal stress test  POST-OPERATIVE DIAGNOSIS:  Same  PROCEDURE:  Procedure(s): LEFT HEART CATH AND CORS/GRAFTS ANGIOGRAPHY (N/A)  SURGEON:  Surgeons and Role:    * Nysha Koplin, MD - Primary  CONCLUSIONS: Severe native CAD with left dominant system and 99% distal LMCA stenosis as well as CTO's of proximal LAD and LCx. Widely patent LIMA-LAD, SVG-OM, and SVG-LPL/LPDA. Normal LVEF and LVEDP.  RECOMMENDATIONS: Increase isosorbide mononitrate to 120 mg daily.  No interventional targets for PCI. Continue aggressive secondary prevention of coronary artery disease.  Yvonne Kendall, MD [facility]

## 2023-07-27 NOTE — Discharge Instructions (Signed)

## 2023-07-28 ENCOUNTER — Encounter (HOSPITAL_COMMUNITY): Payer: Self-pay | Admitting: Internal Medicine

## 2023-07-29 MED FILL — Verapamil HCl IV Soln 2.5 MG/ML: INTRAVENOUS | Qty: 2 | Status: AC

## 2023-08-17 ENCOUNTER — Encounter: Payer: Self-pay | Admitting: Cardiovascular Disease

## 2023-08-17 ENCOUNTER — Ambulatory Visit: Attending: Cardiovascular Disease | Admitting: Cardiovascular Disease

## 2023-08-17 VITALS — BP 100/50 | HR 76 | Ht 73.0 in | Wt 223.0 lb

## 2023-08-17 DIAGNOSIS — E785 Hyperlipidemia, unspecified: Secondary | ICD-10-CM

## 2023-08-17 DIAGNOSIS — I10 Essential (primary) hypertension: Secondary | ICD-10-CM

## 2023-08-17 DIAGNOSIS — I739 Peripheral vascular disease, unspecified: Secondary | ICD-10-CM | POA: Diagnosis not present

## 2023-08-17 DIAGNOSIS — Z951 Presence of aortocoronary bypass graft: Secondary | ICD-10-CM

## 2023-08-17 MED ORDER — RANOLAZINE ER 500 MG PO TB12: 500.0000 mg | ORAL_TABLET | Freq: Two times a day (BID) | ORAL | 3 refills | Status: DC

## 2023-08-17 NOTE — Assessment & Plan Note (Signed)
 History of peripheral arterial disease status post diamondback orbital rotational atherectomy, DCB of left SFA 08/04/2016.  He had a known right SFA occlusion.  I also performed orbital atherectomy and covered stenting of a calcified left common iliac artery 12/06/2020.  He underwent bypass grafting by Dr. Randie Heinz because of critical limb ischemia with healing of his wound.  Dr. Randie Heinz follows his lower extremity arterial Doppler studies.  He still has some right lower extremity claudication but it does not wish to have revascularization.

## 2023-08-17 NOTE — Assessment & Plan Note (Signed)
 History of CABG by Dr. [name]  12/27/2004 for left main three-vessel disease.  He had a LIMA to his LAD, vein to the circumflex branch and left PDA.  Because of an abnormal PET study performed 07/07/2023 with ischemia in the LAD territory done because of chest pain he underwent outpatient diagnostic cath by Dr. Okey Dupre 07/27/2023 revealing a left dominant system, patent grafts with progression of his left main disease jeopardizing diagonal branch.  He also had 90% apical LAD stenosis unchanged from prior caths.  He had no PCI targets.  Medical therapy was recommended.  Imdur was increased.  His chest pain mildly improved although he still has angina particularly at night.  He is maxed out on antianginals with his soft blood pressure.  I am going to start him on Ranexa 500 mg p.o. twice daily I will have him see Azalee Course PA-c back in 3 months for follow-up.

## 2023-08-17 NOTE — Assessment & Plan Note (Signed)
 History of essential hypertension her blood pressure measured today at 100/50.  He is on amlodipine, metoprolol and isosorbide.

## 2023-08-17 NOTE — Assessment & Plan Note (Signed)
 History of dyslipidemia on Repatha and Zetia with lipid profile performed 11/17/2022 revealing total cholesterol 79, LDL 17 and HDL of 46.

## 2023-08-17 NOTE — Progress Notes (Signed)
 08/17/2023 [name]   08/11/1940  [id number]  Primary Physician Saguier, Kateri Mc Primary Cardiologist: [name] MD FACP, [place], [place], [place]  HPI:  [name] is a 83 y.o.    mildly overweight married Caucasian male whoI last saw him in the office 11/18/2022.Marland Kitchen  He is accompanied by his wife [name] today.  He is the father of one child, grandfather to  one grandchild. I last saw him in the office 06/26/2020. 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 [name] December 27, 2004, for left main 3-vessel disease. He had a LIMA to his LAD, a vein to his circumflex marginal branch as well as to the PDA. His other problems include hypertension and hyperlipidemia. He denies chest pain or shortness of breath. He does have left ABI in the 0.85range with a high-frequency signal in his left SFA by duplex ultrasound 10/20/12 but did not claudication at that time. His last Myoview performed 06/27/11 was nonischemic.4 years ago he developed lifestyle limiting bilateral lower extremity claudication. Should be noted that he did stop smoking at the time of his bypass surgery in 2006. He underwent peripheral angiography by myself 08/04/16 revealing a long segment calcified occlusion right SFA and high-grade diffuse calcified left SFA stenosis. I did not think that he could be percutaneously revascularized on the right, and therefore opted for medical therapy. This Pletal has been titrated to 100 mg by mouth twice a day without significant improvement in his claudication symptoms. These are lifestyle limiting. He denies chest pain or shortness of breath.   I performed peripheral angiography and endovascular therapy of his diffusely calcified and highly obstructive left SFA using diamondback orbital rotational atherectomy followed by drug-eluting balloon plasty. This left ABI increased from 0.63-.85 and his claudication has significantly improved and were resolved.    He  had  progressive upper extremity effort angina.  He did have a negative Myoview 07/14/2019.  He saw Corine Shelter, PA-C in the office 04/27/2020.  His Imdur was increased but this is not affected the frequency and/or severity of his symptoms.  He also complains of claudication bilaterally.  He has a known occluded right SFA from prior angiography status post orbital atherectomy, PTA of his subtotally occluded calcified left SFA 04/13/2017.  Recent Dopplers performed 04/26/2020 revealed a left ABI of 0.52 with an occluded left popliteal artery.   I performed right left heart cath on him 06/28/2020 revealing a left dominant system, normal LV function, patent vein to the distal left PDA, LM branch and LIMA to the LAD.  He did have a 90% apical LAD stenosis after LIMA insertion.  His hemodynamics are stable.  He says he feels better since the heart cath although I did not do an intervention.  I also performed abdominal aortography and bilateral iliac angiography revealing no obstructive disease.  He does complain of bilateral calf claudication.  The etiology of his chest pain cannot be explained by the findings on his heart cath I have reassured him and his wife [name].   I performed peripheral angiography on him 12/06/2020 revealing occluded SFAs bilaterally with three-vessel runoff.  He had a 90% calcified exophytic distal left common iliac artery stenosis.  I performed orbital atherectomy, PTA and covered stenting with an 8 mm x 29 mm long VBX covered stent postdilated with a 10 mm x 2 cm balloon.  His follow-up Doppler studies revealed unchanged ABIs although his velocities improved.  His claudication  improved as well although given his SFAs bilaterally.  He still has some lifestyle limiting claudication.  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  He ultimately had left common femoral and tibioperoneal trunk endarterectomy and vein angioplasty with a 6 mm PTFE bypass graft to the below the knee popliteal  artery.  His wound on his left foot has ultimately healed and he was discharged from wound care center last week.  He did have a Myoview stress test performed 02/05/2022 that showed some anterior ischemia but I suspect this is related to his apical LAD lesion that I demonstrated at that time of cardiac catheterization 06/28/2020.  This is somewhat improved with long-acting oral nitrates which I will titrate.   Since I saw him in the office 9 months ago he did see Azalee Course, PA-C in the office complaining of chest pain.  He had a cardiac PET study performed 07/07/2023 that was notable for ischemia in the LAD territory and based on this he underwent outpatient cath by Dr. Okey Dupre 07/27/2023 revealing left dominant system, patent grafts, progression of left main disease jeopardizing a first diagonal branch.  He also had an apical LAD stenosis in the 90% range which is unchanged from prior caths.  There were no PCI targets.  Medical therapy was recommended.  His Imdur was uptitrated.  He still has some angina principally at night.  He also complains of some right lower extremity claudication when walking long distances.   Current Meds  Medication Sig   amLODipine (NORVASC) 10 MG tablet Take 1 tablet (10 mg total) by mouth daily.   aspirin 81 MG tablet Take 81 mg by mouth daily.   carboxymethylcellul-glycerin (REFRESH RELIEVA) 0.5-0.9 % ophthalmic solution Place 1 drop into both eyes 2 (two) times daily.   Cholecalciferol (VITAMIN D) 50 MCG (2000 UT) tablet Take 2,000 Units by mouth daily.   clopidogrel (PLAVIX) 75 MG tablet TAKE 1 TABLET BY MOUTH EVERY DAY   Coenzyme Q10 (COQ10) 200 MG CAPS Take 200 mg by mouth daily.   Evolocumab (REPATHA SURECLICK) 140 MG/ML SOAJ INJECT 140 MG INTO THE SKIN EVERY 14 (FOURTEEN) DAYS.   ezetimibe (ZETIA) 10 MG tablet TAKE 1 TABLET BY MOUTH EVERY DAY   famotidine (PEPCID) 20 MG tablet TAKE 1 TABLET BY MOUTH EVERY DAY   isosorbide mononitrate (IMDUR) 120 MG 24 hr tablet Take 1  tablet (120 mg total) by mouth daily.   levocetirizine (XYZAL) 5 MG tablet TAKE 1 TABLET BY MOUTH EVERY DAY IN THE EVENING   metFORMIN (GLUCOPHAGE) 500 MG tablet TAKE 1 TABLET BY MOUTH 2 TIMES DAILY WITH A MEAL.   metoprolol succinate (TOPROL-XL) 50 MG 24 hr tablet TAKE 1 TABLET (50 MG TOTAL) BY MOUTH TWICE A DAY .TAKE WITH OR IMMEDIATELY FOLLOWING A MEAL   nitroGLYCERIN (NITROSTAT) 0.4 MG SL tablet Place 1 tablet (0.4 mg total) under the tongue every 5 (five) minutes as needed for chest pain.   Omega-3 Fatty Acids (FISH OIL) 1200 MG CAPS Take 2 capsules (2,400 mg total) by mouth every morning.   ranolazine (RANEXA) 500 MG 12 hr tablet Take 1 tablet (500 mg total) by mouth 2 (two) times daily.   tamsulosin (FLOMAX) 0.4 MG CAPS capsule TAKE 1 CAPSULE BY MOUTH EVERY DAY     Allergies  Allergen Reactions   Crestor [Rosuvastatin] Other (See Comments)    Myalgias   Lipitor [Atorvastatin] Other (See Comments)    Myalgias    Zestril [Lisinopril] Cough  Social History   Socioeconomic History   Marital status: Married    Spouse name: [name]   Number of children: 1   Years of education: 12   Highest education level: Not on file  Occupational History   Occupation: retired  Tobacco Use   Smoking status: Former    Current packs/day: 0.00    Average packs/day: 1 pack/day for 50.0 years (50.0 ttl pk-yrs)    Types: Cigarettes    Start date: 12/18/1954    Quit date: 12/17/2004    Years since quitting: 18.6   Smokeless tobacco: Never  Vaping Use   Vaping status: Never Used  Substance and Sexual Activity   Alcohol use: Yes    Comment: occasionally   Drug use: No   Sexual activity: Not on file  Other Topics Concern   Not on file  Social History Narrative   Lives with wife   Caffeine - coffee, 1 cup daily   Social Drivers of Corporate investment banker Strain: Not on file  Food Insecurity: No Food Insecurity (02/04/2022)   Hunger Vital Sign    Worried About Running Out of Food in the  Last Year: Never true    Ran Out of Food in the Last Year: Never true  Transportation Needs: No Transportation Needs (02/04/2022)   PRAPARE - Administrator, Civil Service (Medical): No    Lack of Transportation (Non-Medical): No  Physical Activity: Not on file  Stress: Not on file  Social Connections: Not on file  Intimate Partner Violence: Not At Risk (02/05/2022)   Humiliation, Afraid, Rape, and Kick questionnaire    Fear of Current or Ex-Partner: No    Emotionally Abused: No    Physically Abused: No    Sexually Abused: No     Review of Systems: General: negative for chills, fever, night sweats or weight changes.  Cardiovascular: negative for chest pain, dyspnea on exertion, edema, orthopnea, palpitations, paroxysmal nocturnal dyspnea or shortness of breath Dermatological: negative for rash Respiratory: negative for cough or wheezing Urologic: negative for hematuria Abdominal: negative for nausea, vomiting, diarrhea, bright red blood per rectum, melena, or hematemesis Neurologic: negative for visual changes, syncope, or dizziness All other systems reviewed and are otherwise negative except as noted above.    Blood pressure (!) 100/50, pulse 76, height 6\' 1"  (1.854 m), weight 223 lb (101.2 kg), SpO2 98%.  General appearance: alert and no distress Neck: no adenopathy, no carotid bruit, no JVD, supple, symmetrical, trachea midline, and thyroid not enlarged, symmetric, no tenderness/mass/nodules Lungs: clear to auscultation bilaterally Heart: regular rate and rhythm, S1, S2 normal, no murmur, click, rub or gallop Extremities: extremities normal, atraumatic, no cyanosis or edema Pulses: Diminished pedal pulses Skin: Skin color, texture, turgor normal. No rashes or lesions Neurologic: Grossly normal  EKG not performed today      ASSESSMENT AND PLAN:   Hx of CABG History of CABG by Dr. [name]  12/27/2004 for left main three-vessel disease.  He had a LIMA to his LAD,  vein to the circumflex branch and left PDA.  Because of an abnormal PET study performed 07/07/2023 with ischemia in the LAD territory done because of chest pain he underwent outpatient diagnostic cath by Dr. Okey Dupre 07/27/2023 revealing a left dominant system, patent grafts with progression of his left main disease jeopardizing diagonal branch.  He also had 90% apical LAD stenosis unchanged from prior caths.  He had no PCI targets.  Medical therapy was recommended.  Imdur  was increased.  His chest pain mildly improved although he still has angina particularly at night.  He is maxed out on antianginals with his soft blood pressure.  I am going to start him on Ranexa 500 mg p.o. twice daily I will have him see Azalee Course PA-c back in 3 months for follow-up.  HTN (hypertension) History of essential hypertension her blood pressure measured today at 100/50.  He is on amlodipine, metoprolol and isosorbide.  Dyslipidemia History of dyslipidemia on Repatha and Zetia with lipid profile performed 11/17/2022 revealing total cholesterol 79, LDL 17 and HDL of 46.  Peripheral vascular disease (HCC) History of peripheral arterial disease status post diamondback orbital rotational atherectomy, DCB of left SFA 08/04/2016.  He had a known right SFA occlusion.  I also performed orbital atherectomy and covered stenting of a calcified left common iliac artery 12/06/2020.  He underwent bypass grafting by Dr. Randie Heinz because of critical limb ischemia with healing of his wound.  Dr. Randie Heinz follows his lower extremity arterial Doppler studies.  He still has some right lower extremity claudication but it does not wish to have revascularization.     [name] MD FACP,FACC,FAHA, [facility] 08/17/2023 9:20 AM

## 2023-08-17 NOTE — Patient Instructions (Signed)
 Medication Instructions:  Your physician has recommended you make the following change in your medication:   -Start ranolazine (Renexa) 500mg  twice daily.  *If you need a refill on your cardiac medications before your next appointment, please call your pharmacy*   Follow-Up: At [facility], you and your health needs are our priority.  As part of our continuing mission to provide you with exceptional heart care, our providers are all part of one team.  This team includes your primary Cardiologist (physician) and Advanced Practice Providers or APPs (Physician Assistants and Nurse Practitioners) who all work together to provide you with the care you need, when you need it.  Your next appointment:   3 month(s)  Provider:   Azalee Course, PA-C         Then, Nanetta Batty, MD will plan to see you again in 12 month(s).    We recommend signing up for the patient portal called "MyChart".  Sign up information is provided on this After Visit Summary.  MyChart is used to connect with patients for Virtual Visits (Telemedicine).  Patients are able to view lab/test results, encounter notes, upcoming appointments, etc.  Non-urgent messages can be sent to your provider as well.   To learn more about what you can do with MyChart, go to [email or web address].   Other Instructions       1st Floor: - Lobby - Registration  - Pharmacy  - Lab - Cafe  2nd Floor: - PV Lab - Diagnostic Testing (echo, CT, nuclear med)  3rd Floor: - Vacant  4th Floor: - TCTS (cardiothoracic surgery) - AFib Clinic - Structural Heart Clinic - Vascular Surgery  - Vascular Ultrasound  5th Floor: - HeartCare Cardiology (general and EP) - Clinical Pharmacy for coumadin, hypertension, lipid, weight-loss medications, and med management appointments    Valet parking services will be available as well.

## 2023-08-18 ENCOUNTER — Ambulatory Visit (INDEPENDENT_AMBULATORY_CARE_PROVIDER_SITE_OTHER): Admitting: Podiatry

## 2023-08-18 ENCOUNTER — Encounter: Payer: Self-pay | Admitting: Podiatry

## 2023-08-18 DIAGNOSIS — B351 Tinea unguium: Secondary | ICD-10-CM | POA: Diagnosis not present

## 2023-08-18 DIAGNOSIS — M79675 Pain in left toe(s): Secondary | ICD-10-CM | POA: Diagnosis not present

## 2023-08-18 DIAGNOSIS — M79674 Pain in right toe(s): Secondary | ICD-10-CM | POA: Diagnosis not present

## 2023-08-18 DIAGNOSIS — E1142 Type 2 diabetes mellitus with diabetic polyneuropathy: Secondary | ICD-10-CM

## 2023-08-18 DIAGNOSIS — I739 Peripheral vascular disease, unspecified: Secondary | ICD-10-CM

## 2023-08-18 NOTE — Progress Notes (Unsigned)
  Subjective:  Patient ID: [name], male    DOB: 1941/04/26,  MRN: [id number]  Chief Complaint  Patient presents with   [facility]    [facility] today wit out callous.  Last A1c was in Dec, it was 6.8 and he takes ASA 80    83 y.o. male presents with the above complaint. History confirmed with patient. Patient presenting with pain related to dystrophic thickened elongated nails. Patient is unable to trim own nails related to nail dystrophy and/or mobility issues. Patient does  have a history of T2DM.  Does report history of left lower extremity bypass. History of CABG. No other pedal complaints.  Patient notes that the nails have been slow to grow.  Last A1c 6.8.  Objective:  Physical Exam:  Nail exam onychomycosis of the toenails, onycholysis, and dystrophic nails Nonpalpable DP and PT pulses bilaterally.  Capillary refill time approximately 3 seconds. Protective sensation absent. Left Foot:  Pain with palpation of nails due to elongation and dystrophic growth.  Right Foot: Pain with palpation of nails due to elongation and dystrophic growth.   Assessment:   1. Pain due to onychomycosis of toenails of both feet   2. Diabetic polyneuropathy associated with type 2 diabetes mellitus (HCC)   3. PAD (peripheral artery disease) (HCC)        Plan:  Patient was evaluated and treated and all questions answered.  #Onychomycosis with pain  -Nails palliatively debrided as below. -Educated on self-care  Procedure: Nail Debridement Rationale: Pain Type of Debridement: manual, sharp debridement. Instrumentation: Nail nipper, rotary burr. Number of Nails: 10  Patient educated on diabetes. Discussed proper diabetic foot care and discussed risks and complications of disease. Educated patient in depth on reasons to return to the office immediately should he/she discover anything concerning or new on the feet. All questions answered. Discussed proper shoes as well.   Patient would like to push next  appointment out to 4 months.  We may do this but instructed patient to monitor feet closely and to make appointment soon as possible if anything concerning develops.  Return in about 4 months (around 12/18/2023) for Diabetic Foot Care.         Bronwen Betters, DPM [facility] / [facility]

## 2023-08-19 ENCOUNTER — Encounter: Payer: Self-pay | Admitting: Cardiovascular Disease

## 2023-08-24 ENCOUNTER — Ambulatory Visit: Payer: Medicare HMO | Admitting: Podiatry

## 2023-08-29 ENCOUNTER — Other Ambulatory Visit: Payer: Self-pay | Admitting: Cardiovascular Disease

## 2023-10-10 ENCOUNTER — Other Ambulatory Visit: Payer: Self-pay | Admitting: Cardiovascular Disease

## 2023-10-21 ENCOUNTER — Other Ambulatory Visit: Payer: Self-pay | Admitting: Medical

## 2023-11-10 ENCOUNTER — Encounter: Payer: Self-pay | Admitting: Physician Assistant

## 2023-11-10 ENCOUNTER — Ambulatory Visit: Attending: Physician Assistant | Admitting: Physician Assistant

## 2023-11-10 VITALS — BP 100/56 | HR 65 | Ht 73.0 in | Wt 219.0 lb

## 2023-11-10 DIAGNOSIS — I739 Peripheral vascular disease, unspecified: Secondary | ICD-10-CM | POA: Diagnosis not present

## 2023-11-10 DIAGNOSIS — I10 Essential (primary) hypertension: Secondary | ICD-10-CM | POA: Diagnosis not present

## 2023-11-10 DIAGNOSIS — I25709 Atherosclerosis of coronary artery bypass graft(s), unspecified, with unspecified angina pectoris: Secondary | ICD-10-CM

## 2023-11-10 DIAGNOSIS — E785 Hyperlipidemia, unspecified: Secondary | ICD-10-CM

## 2023-11-10 LAB — LIPID PANEL

## 2023-11-10 MED ORDER — NITROGLYCERIN 0.4 MG SL SUBL: 0.4000 mg | SUBLINGUAL_TABLET | SUBLINGUAL | 3 refills | Status: AC | PRN

## 2023-11-10 NOTE — Patient Instructions (Signed)
 Medication Instructions:  NO CHANGES *If you need a refill on your cardiac medications before your next appointment, please call your pharmacy*  Lab Work: FASTING LIPID PANEL AND LFT TODAY If you have labs (blood work) drawn today and your tests are completely normal, you will receive your results only by: MyChart Message (if you have MyChart) OR A paper copy in the mail If you have any lab test that is abnormal or we need to change your treatment, we will call you to review the results.  Testing/Procedures: NO TESTING  Follow-Up: At [facility], you and your health needs are our priority.  As part of our continuing mission to provide you with exceptional heart care, our providers are all part of one team.  This team includes your primary Cardiologist (physician) and Advanced Practice Providers or APPs (Physician Assistants and Nurse Practitioners) who all work together to provide you with the care you need, when you need it.  Your next appointment:   6 month(s)  Provider:   Dorn Lesches, MD

## 2023-11-10 NOTE — Progress Notes (Signed)
 Cardiology Office Note   Date:  11/10/2023  ID:  [name], DOB 1940-09-23, MRN [id number] PCP: Dorina Dallas RIGGERS  [facility] Providers Cardiologist:  Dorn Lesches, MD     History of Present Illness [name] is a 83 y.o. 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  Myoview  in February 2021 was negative.  Cardiac catheterization in February 2022 revealed normal EF, left dominant system, patent SVG to distal left PDA, patent LIMA to LAD, 90% apical LAD stenosis after LIMA insertion.  He underwent vascular bypass surgery by Dr. Sheree on 01/28/2022.  Patient returned back to the hospital 5 days after being released from admission of vascular bypass surgery with chest discomfort.  Troponin was mildly elevated.  Patient underwent nuclear stress test on 02/05/2022 which came back intermediate risk with ischemia in a relatively small patch of basal anterior wall and anterior septum, Dr. Francyne suspected this is due to left main coronary artery stenosis feeding a relatively short segment of LAD artery was attached to septals and diagonal with downstream occlusion of the mid LAD.  Area of myocardium was relatively small and risk outweighed the potential benefit to pursue cardiac catheterization.  Echocardiogram showed EF 50-55 % with no regional wall motion abnormality, normal RV, no significant valve disease.  Medical management was recommended.  Beta-blocker was increased.  I saw the patient in December 2024 for chest discomfort and recommended PET stress test, PET stress test was not completed until July 07, 2023, this came back severely abnormal with large territory of LAD ischemia.  He eventually underwent cardiac catheterization on 07/27/2023 which showed 99% distal left main stenosis as well as CTO of proximal LAD in the  left circumflex artery, widely patent LIMA-LAD, SVG-OM, SVG-LPL/L PDA.  Medical therapy was recommended, Imdur  increased to 120 mg daily.  Ranexa  was started on follow-up with Dr. Lesches in March 2025.  Patient presents today accompanied by family.  His chest discomfort is fairly well-controlled, only occurs about once every 2 to 4 weeks.  Chest discomfort would last anywhere between 5 minutes to 20 minutes.  It usually does not occur with activity.  His functional ability is fairly limited due to significant hip and leg pain.  He has some kind of side effect associated with Ranexa  and is now only taking Ranexa  during the daytime and not at night.  He says his chest discomfort is fairly infrequent, I recommend continue on the current therapy.  He is due for fasting lipid panel and LFT.  He has no lower extremity edema, orthopnea or PND.  He can follow-up with Dr. Lesches in 6 months.  ROS:   He denies palpitations, dyspnea, pnd, orthopnea, n, v, dizziness, syncope, edema, weight gain, or early satiety. All other systems reviewed and are otherwise negative except as noted above.   She has rare episodes of chest discomfort.  Studies Reviewed      Cardiac Studies & Procedures   ______________________________________________________________________________________________ CARDIAC CATHETERIZATION  CARDIAC CATHETERIZATION 07/27/2023  Conclusion Conclusions: Severe native coronary artery disease with left dominant system and 99% distal LMCA stenosis as well as chronic total occlusions of proximal LAD and LCx.  Compared with prior catheterization in 06/2020, distal LMCA stenosis has worsened and flow is no longer evident in a proximal diagonal branch. Widely patent LIMA-LAD, SVG-OM, and SVG-LPL/LPDA. Normal left ventricular  systolic function (LVEF 55-65%) and filling pressure (LVEDP 13 mmHg).  Recommendations: Increase isosorbide  mononitrate to 120 mg daily.  No interventional targets for PCI. Continue  aggressive secondary prevention of coronary artery disease.  Richard Hanson, MD [facility]  Findings Coronary Findings Diagnostic  Dominance: Left  Left Main Ost LM to Mid LM lesion is 99% stenosed. The lesion is severely calcified.  Left Anterior Descending Prox LAD lesion is 100% stenosed. Prox LAD to Mid LAD lesion is 100% stenosed. Dist LAD lesion is 90% stenosed.  Left Circumflex Ost Cx lesion is 90% stenosed. Prox Cx lesion is 100% stenosed. Dist Cx lesion is 100% stenosed with 80% stenosed side branch in LPAV.  Left Posterior Atrioventricular Artery LPAV lesion is 100% stenosed.  Right Coronary Artery Vessel is small. Vessel is angiographically normal.  Saphenous Graft To 3rd Mrg SVG graft was visualized by angiography and is normal in caliber.  LIMA LIMA Graft To Mid LAD LIMA graft was visualized by angiography and is normal in caliber.  Sequential Saphenous Graft To LPDA, 1st LPL SVG graft was visualized by angiography and is normal in caliber.  Intervention  No interventions have been documented.   CARDIAC CATHETERIZATION  CARDIAC CATHETERIZATION 06/28/2020  Conclusion Images from the original result were not included.   Ost LM to Mid LM lesion is 90% stenosed.  Ost Cx lesion is 90% stenosed.  Prox Cx lesion is 100% stenosed.  Prox LAD to Mid LAD lesion is 100% stenosed.  Dist LAD lesion is 90% stenosed.  The left ventricular systolic function is normal.  LV end diastolic pressure is normal.  The left ventricular ejection fraction is 55-65% by visual estimate.  [name] is a 83 y.o. male   [id number] LOCATION:  FACILITY: [facility] PHYSICIAN: Dorn Lesches, M.D. 01/19/1941   DATE OF PROCEDURE:  06/28/2020  DATE OF DISCHARGE:     CARDIAC CATHETERIZATION    History obtained from chart review.[name] is a 83 y.o.  mildly overweight married Caucasian male whoI last saw him in the office  08/02/2019.  He is accompanied by  his wife Donny today.  He is the father of one child, grandfather and one grandchild. I last saw him in the office 09/12/16. 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 Leetta Laine December 27, 2004, for left main 3-vessel disease. He had a LIMA to his LAD, a vein to his circumflex marginal branch as well as to the PDA. His other problems include hypertension and hyperlipidemia. He denies chest pain or shortness of breath. He does have left ABI in the 0.85range with a high-frequency signal in his left SFA by duplex ultrasound 10/20/12 but did not claudication at that time. His last Myoview  performed 06/27/11 was nonischemic.4 years ago he developed lifestyle limiting bilateral lower extremity claudication. Should be noted that he did stop smoking at the time of his bypass surgery in 2006. He underwent peripheral angiography by myself 08/04/16 revealing a long segment calcified occlusion right SFA and high-grade diffuse calcified left SFA stenosis. I did not think that he could be percutaneously revascularized on the right, and therefore opted for medical therapy. This Pletal  has been titrated to 100 mg by mouth twice a day without significant improvement in his claudication symptoms. These are lifestyle limiting. He denies chest pain or shortness of breath. I performed peripheral angiography and endovascular therapy of his diffusely calcified and highly obstructive left SFA using diamondback orbital rotational atherectomy followed by drug-eluting balloon  plasty. This left ABI increased from 0.63-.85 and his claudication has significantly improved and were resolved.   His most recent Dopplers performed 04/27/2019 suggest restenosis of his left SFA although he continues to deny claudication.  Denies chest pain or shortness of breath.  He is statin intolerant and was recently approved for Repatha .  Since I saw him in the office a year ago he has had progressive upper extremity effort angina.   He did have a negative Myoview  07/14/2019.  He saw Herlene Canterbury, PA-C in the office 04/27/2020.  His Imdur  was increased but this is not affected the frequency and/or severity of his symptoms.  He also complains of claudication bilaterally.  He has a known occluded right SFA from prior angiography status post orbital atherectomy, PTA of his subtotally occluded calcified left SFA 04/13/2017.  Recent Dopplers performed 04/26/2020 revealed a left ABI of 0.52 with an occluded left popliteal artery.  Because of ongoing chest pain and shortness of breath he was elected to proceed with outpatient diagnostic coronary angiography to define his anatomy and physiology given that he had 82 year old grafts.  Impression Mr. Durbin had a left dominant system with a 90% distal left main, occluded LAD and circumflex, patent vein to the PDA on the left, patent vein to an OM branch and patent LIMA to the LAD.  He had normal LV function and normal filling pressures.  There is no culprit lesion that would explain his chest pain.  Continue medical therapy will be recommended.  A right femoral artery angiogram was performed and a Mynx closure device was deployed successfully in the right common femoral artery and vein.  Pressure was held.  Patient left lab in stable condition.  He will be gently hydrated, discharged home later this afternoon.  I will see him back in the office in 2 to 3 weeks for follow-up.  Dorn Lesches. MD, [facility] 06/28/2020 1:17 PM  Findings Coronary Findings Diagnostic  Dominance: Left  Left Main Ost LM to Mid LM lesion is 90% stenosed. The lesion is calcified.  Left Anterior Descending Prox LAD to Mid LAD lesion is 100% stenosed. Dist LAD lesion is 90% stenosed.  Left Circumflex Ost Cx lesion is 90% stenosed. Prox Cx lesion is 100% stenosed.  Graft To LPDA  Graft To 3rd Mrg  LIMA Graft To Mid LAD  Intervention  No interventions have been documented.   STRESS TESTS  NM PET CT CARDIAC  PERFUSION MULTI W/ABSOLUTE BLOODFLOW 07/07/2023  Narrative   Findings are consistent with ischemia. The study is high risk.  There is a large territory of LAD ischemia with stress.  Though a MBFR < 1.2 can represent stress non-responder, the  associated ischemia is a high risk feature.  TID visually present.   LV perfusion is abnormal. There is evidence of ischemia. There is no evidence of infarction. Defect 1: There is a large defect with severe reduction in uptake present in the apical to basal anterior location(s) that is reversible. There is abnormal wall motion in the defect area. Consistent with ischemia.   Rest left ventricular function is normal. Stress left ventricular function is normal. End diastolic cavity size is moderately enlarged. End systolic cavity size is normal.   Myocardial blood flow was computed to be 0.80ml/g/min at rest and 1.08ml/g/min at stress. Global myocardial blood flow reserve was 1.19 and was highly abnormal.   Coronary calcium  assessment not performed due to prior revascularization. Aortic atherosclerosis noted.  Venous bypass grafts visualized at the ostia.  Electronically Signed  By: Stanly Leavens M.D.  CLINICAL DATA:  This over-read does not include interpretation of cardiac or coronary anatomy or pathology. The Cardiac PET CT interpretation by the cardiologist is attached.  COMPARISON:  CT chest angiogram, 02/04/2022  FINDINGS: Cardiovascular: Aortic atherosclerosis. Mild cardiomegaly. Three-vessel coronary artery calcifications status post median sternotomy and CABG. No pericardial effusion.  Limited Mediastinum/Nodes: No enlarged mediastinal, hilar, or axillary lymph nodes. Trachea and esophagus demonstrate no significant findings.  Limited Lungs/Pleura: Emphysema. No pleural effusion or pneumothorax.  Upper Abdomen: No acute abnormality.  Musculoskeletal: No chest wall abnormality. No acute osseous findings.  IMPRESSION: 1. No acute CT  findings of the included chest. 2. Emphysema. 3. Coronary artery disease.   Electronically Signed By: Marolyn JONETTA Jaksch M.D. On: 07/07/2023 10:57   ECHOCARDIOGRAM  ECHOCARDIOGRAM COMPLETE 02/05/2022  Narrative ECHOCARDIOGRAM REPORT    Patient Name:   [name] Date of Exam: 02/05/2022 Medical Rec #:  [id number]   Height:       73.0 in Accession #:    [id number]  Weight:       214.7 lb Date of Birth:  July 10, 1940  BSA:          2.218 m Patient Age:    80 years    BP:           127/64 mmHg Patient Gender: M           HR:           78 bpm. Exam Location:  Inpatient  Procedure: 2D Echo, Cardiac Doppler and Color Doppler  Indications:    Chest pain  History:        Patient has no prior history of Echocardiogram examinations. Prior CABG; Risk Factors:Hypertension, Diabetes and Former Smoker. PAD. Orthopnea.  Sonographer:    Rome Eans RDCS (AE) Referring Phys: ANASTASSIA DOUTOVA   Sonographer Comments: No subcostal window. IMPRESSIONS   1. Left ventricular ejection fraction, by estimation, is 50 to 55%. The left ventricle has low normal function. The left ventricle has no regional wall motion abnormalities. Left ventricular diastolic parameters are indeterminate. 2. Right ventricular systolic function is normal. The right ventricular size is normal. 3. The mitral valve is normal in structure. No evidence of mitral valve regurgitation. No evidence of mitral stenosis. Moderate mitral annular calcification. 4. The aortic valve is grossly normal. There is mild calcification of the aortic valve. There is mild thickening of the aortic valve. Aortic valve regurgitation is not visualized. Aortic valve sclerosis/calcification is present, without any evidence of aortic stenosis.  Comparison(s): No prior Echocardiogram.  Conclusion(s)/Recommendation(s): Otherwise normal echocardiogram, with minor abnormalities described in the report.  FINDINGS Left Ventricle: Left ventricular ejection  fraction, by estimation, is 50 to 55%. The left ventricle has low normal function. The left ventricle has no regional wall motion abnormalities. The left ventricular internal cavity size was normal in size. There is no left ventricular hypertrophy. Abnormal (paradoxical) septal motion consistent with post-operative status. Left ventricular diastolic parameters are indeterminate.  Right Ventricle: The right ventricular size is normal. No increase in right ventricular wall thickness. Right ventricular systolic function is normal.  Left Atrium: Left atrial size was normal in size.  Right Atrium: Right atrial size was normal in size.  Pericardium: There is no evidence of pericardial effusion.  Mitral Valve: The mitral valve is normal in structure. Moderate mitral annular calcification. No evidence of mitral valve regurgitation. No evidence of mitral valve stenosis.  Tricuspid Valve: The tricuspid valve is normal in  structure. Tricuspid valve regurgitation is trivial. No evidence of tricuspid stenosis.  Aortic Valve: The aortic valve is grossly normal. There is mild calcification of the aortic valve. There is mild thickening of the aortic valve. Aortic valve regurgitation is not visualized. Aortic valve sclerosis/calcification is present, without any evidence of aortic stenosis. Aortic valve mean gradient measures 4.0 mmHg. Aortic valve peak gradient measures 7.4 mmHg. Aortic valve area, by VTI measures 2.62 cm.  Pulmonic Valve: The pulmonic valve was not well visualized. Pulmonic valve regurgitation is not visualized.  Aorta: The ascending aorta was not well visualized and the aortic root is normal in size and structure.  Venous: The inferior vena cava was not well visualized.  IAS/Shunts: The atrial septum is grossly normal.   LEFT VENTRICLE PLAX 2D LVIDd:         5.30 cm   Diastology LVIDs:         4.10 cm   LV e' medial:    6.53 cm/s LV PW:         1.10 cm   LV E/e' medial:  18.1 LV  IVS:        0.90 cm   LV e' lateral:   8.27 cm/s LVOT diam:     2.10 cm   LV E/e' lateral: 14.3 LV SV:         72 LV SV Index:   33 LVOT Area:     3.46 cm   RIGHT VENTRICLE RV Basal diam:  3.50 cm RV S prime:     12.40 cm/s TAPSE (M-mode): 3.0 cm  LEFT ATRIUM             Index        RIGHT ATRIUM           Index LA diam:        4.00 cm 1.80 cm/m   RA Area:     21.50 cm LA Vol (A2C):   45.6 ml 20.56 ml/m  RA Volume:   63.10 ml  28.45 ml/m LA Vol (A4C):   45.5 ml 20.52 ml/m LA Biplane Vol: 50.0 ml 22.55 ml/m AORTIC VALVE AV Area (Vmax):    2.73 cm AV Area (Vmean):   2.47 cm AV Area (VTI):     2.62 cm AV Vmax:           136.00 cm/s AV Vmean:          85.300 cm/s AV VTI:            0.276 m AV Peak Grad:      7.4 mmHg AV Mean Grad:      4.0 mmHg LVOT Vmax:         107.00 cm/s LVOT Vmean:        60.900 cm/s LVOT VTI:          0.209 m LVOT/AV VTI ratio: 0.76  AORTA Ao Root diam: 3.60 cm  MITRAL VALVE MV Area (PHT): 4.54 cm     SHUNTS MV Decel Time: 167 msec     Systemic VTI:  0.21 m MV E velocity: 118.00 cm/s  Systemic Diam: 2.10 cm MV A velocity: 89.60 cm/s MV E/A ratio:  1.32  Richard Bruckner MD Electronically signed by Richard Bruckner MD Signature Date/Time: 02/05/2022/11:32:23 AM    Final          ______________________________________________________________________________________________      Risk Assessment/Calculations           Physical Exam VS:  BP (!) 100/56 (BP  Location: Right Arm)   Pulse 65   Ht 6' 1 (1.854 m)   Wt 219 lb (99.3 kg)   SpO2 95%   BMI 28.89 kg/m        Wt Readings from Last 3 Encounters:  11/10/23 219 lb (99.3 kg)  08/17/23 223 lb (101.2 kg)  07/27/23 224 lb (101.6 kg)    GEN: Well nourished, well developed in no acute distress NECK: No JVD; No carotid bruits CARDIAC: RRR, no murmurs, rubs, gallops RESPIRATORY:  Clear to auscultation without rales, wheezing or rhonchi  ABDOMEN: Soft,  non-tender, non-distended EXTREMITIES:  No edema; No deformity   ASSESSMENT AND PLAN  CAD/status post CABG: Recent cardiac catheterization showed stable and mean.  Recommend continue medical management.  He was started on Ranexa  during the last office visit with Dr. Court, he could not tolerate twice a day dosing on Ranexa , family does not remember the type of reaction he had.  He was able to tolerate a once a day dosing.  Currently, he only has chest discomfort about once every 2 to 4 weeks.  Will continue on the current therapy.  Hypertension: Blood pressure stable  Hyperlipidemia: On Zetia  and Repatha .  Due for fasting lipid panel and LFT  PAD: Denies significant claudication symptom.      Dispo: Follow-up with Dr. Court in 6 months  Signed, Jerilynn Feldmeier, [place]

## 2023-11-11 LAB — HEPATIC FUNCTION PANEL
ALT: 14 IU/L (ref 0–44)
AST: 16 IU/L (ref 0–40)
Albumin: 4.4 g/dL (ref 3.7–4.7)
Alkaline Phosphatase: 71 IU/L (ref 44–121)
Bilirubin Total: 1.4 mg/dL — ABNORMAL HIGH (ref 0.0–1.2)
Bilirubin, Direct: 0.57 mg/dL — ABNORMAL HIGH (ref 0.00–0.40)
Total Protein: 6.6 g/dL (ref 6.0–8.5)

## 2023-11-11 LAB — LIPID PANEL
Cholesterol, Total: 68 mg/dL — ABNORMAL LOW (ref 100–199)
HDL: 41 mg/dL (ref >39)
LDL CALC COMMENT:: 1.7 ratio (ref 0.0–5.0)
LDL Chol Calc (NIH): 6 mg/dL (ref 0–99)
Triglycerides: 117 mg/dL (ref 0–149)
VLDL Cholesterol Cal: 21 mg/dL (ref 5–40)

## 2023-11-12 ENCOUNTER — Ambulatory Visit: Payer: Self-pay | Admitting: Physician Assistant

## 2023-11-17 ENCOUNTER — Telehealth: Payer: Self-pay | Admitting: Medical

## 2023-11-17 NOTE — Telephone Encounter (Signed)
 Copied from CRM [id number]. Topic: Medicare AWV >> Nov 17, 2023 10:09 AM Richard DEL wrote: Reason for CRM: LVM 11/17/2023 to schedule AWV. Please schedule Virtual or Telehealth visits ONLY.   [name]; Care Guide Ambulatory Clinical Support [place] [facility] Direct Dial: [phone]

## 2023-11-24 ENCOUNTER — Other Ambulatory Visit: Payer: Self-pay | Admitting: Internal Medicine

## 2023-12-06 ENCOUNTER — Other Ambulatory Visit: Payer: Self-pay | Admitting: Medical

## 2023-12-21 ENCOUNTER — Ambulatory Visit (INDEPENDENT_AMBULATORY_CARE_PROVIDER_SITE_OTHER): Admitting: Podiatry

## 2023-12-21 ENCOUNTER — Encounter: Payer: Self-pay | Admitting: Podiatry

## 2023-12-21 DIAGNOSIS — E1142 Type 2 diabetes mellitus with diabetic polyneuropathy: Secondary | ICD-10-CM | POA: Diagnosis not present

## 2023-12-21 DIAGNOSIS — M79674 Pain in right toe(s): Secondary | ICD-10-CM

## 2023-12-21 DIAGNOSIS — M79675 Pain in left toe(s): Secondary | ICD-10-CM | POA: Diagnosis not present

## 2023-12-21 DIAGNOSIS — I739 Peripheral vascular disease, unspecified: Secondary | ICD-10-CM

## 2023-12-21 DIAGNOSIS — B351 Tinea unguium: Secondary | ICD-10-CM | POA: Diagnosis not present

## 2023-12-21 NOTE — Progress Notes (Signed)
  Subjective:  Patient ID: [name], male    DOB: 1940/11/01,  MRN: [id number]  Chief Complaint  Patient presents with   [facility]    [facility] with out callous. Last A1c was 6.8 in Dec. Takes Plavix  and ASA     83 y.o. male presents with the above complaint. History confirmed with patient. Patient presenting with pain related to dystrophic thickened elongated nails. Patient is unable to trim own nails related to nail dystrophy and/or mobility issues. Patient does  have a history of T2DM.  Does report history of left lower extremity bypass. History of CABG. No other pedal complaints.  Patient notes that the nails have been slow to grow. Reports the left 1st toenail did fall off a couple of weeks ago without underlying wound, he reports this does happen recurrently. Last A1c 6.8.  Objective:  Physical Exam:  Nail exam onychomycosis of the toenails, onycholysis, and dystrophic nails Nonpalpable DP and PT pulses bilaterally.  Capillary refill time approximately 3 seconds. Protective sensation absent. Left Foot:  Pain with palpation of nails due to elongation and dystrophic growth. Left 1st toenail has fallen off, no underlying wound. Right Foot: Pain with palpation of nails due to elongation and dystrophic growth.   Assessment:   1. Pain due to onychomycosis of toenails of both feet   2. Diabetic polyneuropathy associated with type 2 diabetes mellitus (HCC)   3. PAD (peripheral artery disease) (HCC)        Plan:  Patient was evaluated and treated and all questions answered.  #Onychomycosis with pain  -Nails palliatively debrided as below. -Educated on self-care  Procedure: Nail Debridement Rationale: Pain Type of Debridement: manual, sharp debridement. Instrumentation: Nail nipper, rotary burr. Number of Nails: 9  Patient educated on diabetes. Discussed proper diabetic foot care and discussed risks and complications of disease. Educated patient in depth on reasons to return to the office  immediately should he/she discover anything concerning or new on the feet. All questions answered. Discussed proper shoes as well.     Return in about 3 months (around 03/22/2024) for Diabetic Foot Care.         Ethan Saddler, DPM [facility] / [facility]

## 2023-12-26 ENCOUNTER — Other Ambulatory Visit: Payer: Self-pay | Admitting: Medical

## 2024-01-06 ENCOUNTER — Other Ambulatory Visit: Payer: Self-pay | Admitting: Medical

## 2024-01-22 ENCOUNTER — Ambulatory Visit: Payer: Self-pay

## 2024-01-22 NOTE — Telephone Encounter (Signed)
 FYI Only or Action Required?: FYI only for provider.  Patient was last seen in primary care on 05/15/2023 by Dorina Loving, PA-C.  Called Nurse Triage reporting Dizziness.  Symptoms began a month and a half ago--- 3-4 episodes of dizziness.  Interventions attempted: Rest, hydration, or home remedies.  Symptoms are: not present during the time of triage and last episode a few days ago.  Triage Disposition: See Physician Within 24 Hours  Patient/caregiver understands and will follow disposition?: Yes---Patient states he is going to Urgent Care due to no availability in PCP office         Copied from CRM [id number]. Topic: Clinical - Red Word Triage >> Jan 22, 2024  8:54 AM Berneda FALCON wrote: Red Word that prompted transfer to Nurse Triage: PT having dizzy spells and blood sugar problems for a couple weeks now.  He has an appt and said they would call back to be triaged in the notes.  Blood sugar readings were unavailable-felt dizzy and then gone in an hour, but has not tested it Reason for Disposition  [1] MODERATE dizziness (e.g., interferes with normal activities) AND [2] has NOT been evaluated by doctor (or NP/PA) for this  (Exception: Dizziness caused by heat exposure, sudden standing, or poor fluid intake.)  Answer Assessment - Initial Assessment Questions Patient's chart states history of diabetes type 2 and patient takes Metformin  Patient doesn't check his blood sugar and states he doesn't have a way to  95% Oxygen saturation 66p on pulse oximeter during triage Patient states that he feels fine now He is advised that with his symptoms it is recommended that he is seen and evaluated in the next 24 hours There are no openings in his PCP office or surrounding offices today Patient is advised to be seen at Urgent Care and patient agrees to do this Patient's appointment for next week that was scheduled prior to triage is kept in case patient needs to follow up with his  PCP  Patient is advised that if anything worsens to go to the Emergency Room. Patient verbalized understanding.      1. DESCRIPTION: Describe your dizziness.     Has to sit down and then it will go away 2. LIGHTHEADED: Do you feel lightheaded? (e.g., somewhat faint, woozy, weak upon standing)     Just dizzy 3. VERTIGO: Do you feel like either you or the room is spinning or tilting? (i.e., vertigo)     Just dizzy 4. SEVERITY: How bad is it?  Do you feel like you are going to faint? Can you stand and walk?     Patient not dizzy at this time  5. ONSET:  When did the dizziness begin?     3-4 dizzy spells within last month and a half with the last one being a few days ago 6. AGGRAVATING FACTORS: Does anything make it worse? (e.g., standing, change in head position)     ----- 7. HEART RATE: Can you tell me your heart rate? How many beats in 15 seconds?  (Note: Not all patients can do this.)       --- 8. CAUSE: What do you think is causing the dizziness? (e.g., decreased fluids or food, diarrhea, emotional distress, heat exposure, new medicine, sudden standing, vomiting; unknown)     unknown 9. RECURRENT SYMPTOM: Have you had dizziness before? If Yes, ask: When was the last time? What happened that time?     Never assessed 10. OTHER SYMPTOMS: Do you have any other  symptoms? (e.g., fever, chest pain, vomiting, diarrhea, bleeding)       Patient denies  Protocols used: Dizziness - Lightheadedness-A-AH

## 2024-01-22 NOTE — Telephone Encounter (Signed)
FYI. Pt going to UC.

## 2024-01-26 ENCOUNTER — Other Ambulatory Visit: Payer: Self-pay

## 2024-01-26 DIAGNOSIS — I739 Peripheral vascular disease, unspecified: Secondary | ICD-10-CM

## 2024-01-28 ENCOUNTER — Ambulatory Visit: Payer: Self-pay | Admitting: Medical

## 2024-01-28 ENCOUNTER — Ambulatory Visit: Admitting: Medical

## 2024-01-28 VITALS — BP 116/70 | HR 56 | Temp 98.8°F | Resp 16 | Ht 73.0 in | Wt 209.0 lb

## 2024-01-28 DIAGNOSIS — R5383 Other fatigue: Secondary | ICD-10-CM

## 2024-01-28 DIAGNOSIS — E119 Type 2 diabetes mellitus without complications: Secondary | ICD-10-CM | POA: Diagnosis not present

## 2024-01-28 DIAGNOSIS — I10 Essential (primary) hypertension: Secondary | ICD-10-CM

## 2024-01-28 DIAGNOSIS — R42 Dizziness and giddiness: Secondary | ICD-10-CM

## 2024-01-28 DIAGNOSIS — M791 Myalgia, unspecified site: Secondary | ICD-10-CM | POA: Diagnosis not present

## 2024-01-28 DIAGNOSIS — R059 Cough, unspecified: Secondary | ICD-10-CM

## 2024-01-28 DIAGNOSIS — U071 COVID-19: Secondary | ICD-10-CM

## 2024-01-28 LAB — POCT INFLUENZA A/B
Influenza A, POC: NEGATIVE
Influenza B, POC: NEGATIVE

## 2024-01-28 LAB — POC COVID19 BINAXNOW: SARS Coronavirus 2 Ag: POSITIVE — AB

## 2024-01-28 LAB — CBC WITH DIFFERENTIAL/PLATELET
Basophils Absolute: 0.1 K/uL (ref 0.0–0.1)
Basophils Relative: 0.7 % (ref 0.0–3.0)
Eosinophils Absolute: 0 K/uL (ref 0.0–0.7)
Eosinophils Relative: 0.3 % (ref 0.0–5.0)
HCT: 41.3 % (ref 39.0–52.0)
Hemoglobin: 13.7 g/dL (ref 13.0–17.0)
Lymphocytes Relative: 14.5 % (ref 12.0–46.0)
Lymphs Abs: 1.2 K/uL (ref 0.7–4.0)
MCHC: 33.3 g/dL (ref 30.0–36.0)
MCV: 89.4 fl (ref 78.0–100.0)
Monocytes Absolute: 1.1 K/uL — ABNORMAL HIGH (ref 0.1–1.0)
Monocytes Relative: 13.6 % — ABNORMAL HIGH (ref 3.0–12.0)
Neutro Abs: 5.7 K/uL (ref 1.4–7.7)
Neutrophils Relative %: 70.9 % (ref 43.0–77.0)
Platelets: 211 K/uL (ref 150.0–400.0)
RBC: 4.62 Mil/uL (ref 4.22–5.81)
RDW: 14.1 % (ref 11.5–15.5)
WBC: 8 K/uL (ref 4.0–10.5)

## 2024-01-28 LAB — COMPREHENSIVE METABOLIC PANEL WITH GFR
ALT: 9 U/L (ref 0–53)
AST: 16 U/L (ref 0–37)
Albumin: 4.4 g/dL (ref 3.5–5.2)
Alkaline Phosphatase: 52 U/L (ref 39–117)
BUN: 14 mg/dL (ref 6–23)
CO2: 26 meq/L (ref 19–32)
Calcium: 9.4 mg/dL (ref 8.4–10.5)
Chloride: 100 meq/L (ref 96–112)
Creatinine, Ser: 0.92 mg/dL (ref 0.40–1.50)
GFR: 77.85 mL/min (ref >60.00)
Glucose, Bld: 126 mg/dL — ABNORMAL HIGH (ref 70–99)
Potassium: 3.9 meq/L (ref 3.5–5.1)
Sodium: 137 meq/L (ref 135–145)
Total Bilirubin: 1.3 mg/dL — ABNORMAL HIGH (ref 0.2–1.2)
Total Protein: 7 g/dL (ref 6.0–8.3)

## 2024-01-28 LAB — HEMOGLOBIN A1C: Hgb A1c MFr Bld: 6.7 % — ABNORMAL HIGH (ref 4.6–6.5)

## 2024-01-28 MED ORDER — BENZONATATE 100 MG PO CAPS: 100.0000 mg | ORAL_CAPSULE | Freq: Three times a day (TID) | ORAL | 0 refills | Status: DC | PRN

## 2024-01-28 MED ORDER — NIRMATRELVIR/RITONAVIR (PAXLOVID) TABLET (RENAL DOSING): 2.0000 | ORAL_TABLET | Freq: Two times a day (BID) | ORAL | 0 refills | Status: AC

## 2024-01-28 MED ORDER — FLUTICASONE PROPIONATE 50 MCG/ACT NA SUSP: 2.0000 | Freq: Every day | NASAL | 1 refills | Status: DC

## 2024-01-28 MED ORDER — AMLODIPINE BESYLATE 2.5 MG PO TABS: 2.5000 mg | ORAL_TABLET | Freq: Every day | ORAL | 0 refills | Status: DC

## 2024-01-28 NOTE — Patient Instructions (Signed)
 COVID-19 infection Confirmed with positive test. Symptoms include fever, cough, myalgia, and fatigue. Considering antiviral treatment due to age and potential complications. Discussed potential drug interactions with amlodipine  and Ranexa . - Prescribe Paxlovid  based on previous kidney function numbers. Do not pick up medication until kidney function results are confirmed. - Hold Ranexa  for the duration of Paxlovid  treatment and an additional three days. - Adjust amlodipine  dosage to 2.5 mg for six days during Paxlovid  treatment. - Ensure adequate hydration and monitor blood pressure. If bp low/systllic less than 100 hold amlodipine . - Monitor for potential drug interactions, especially with amlodipine .  Cough, myalgia, and fatigue associated with COVID-19 infection Symptoms consistent with COVID-19 infection. - Prescribe cough tablet benzonatate  and  flonase  nasal spray for congestion. - Ensure adequate rest and hydration.  Dizziness Intermittent over two months, not positional. Blood pressure on the lower end, possibly contributing. Differential includes dehydration, low blood pressure, or blood sugar fluctuations. - Check blood pressure and blood sugar during dizzy spells. - Stay hydrated, especially when working outside. - Monitor for associated symptoms like headache, vision changes, or slurred speech. If such occur then ED evaluation.  Essential hypertension Recent blood pressure readings on the lower end. On Toprol  XL 50 mg daily and amlodipine . Blood pressure management complicated by potential interactions with Paxlovid . - Adjust amlodipine  dosage to 2.5 mg for six days during Paxlovid  treatment. - Monitor blood pressure closely, especially during Paxlovid  treatment. Hold amlodipine  if systolic blood pressure drops below 100 mmHg.  Type 2 diabetes mellitus Last A1c of 6.8% eight months ago. No recent blood sugar checks during dizzy spells. - Order A1c test. - Check blood sugar  during dizzy spells.  Follow up 10 days or sooner if needed

## 2024-01-28 NOTE — Progress Notes (Signed)
 Subjective:    Patient ID: [name], male    DOB: 1941-01-07, 83 y.o.   MRN: [id number]  HPI  [name] is an 83 year old male with coronary artery disease and diabetes who presents with intermittent dizziness and a recent upper respiratory infection.  He has been experiencing intermittent dizziness over the past three months, described as a lightheaded sensation rather than vertigo. The episodes occur randomly, not associated with changes in position, and have happened approximately three times in the last two months. The most recent and severe episode occurred last week, lasting up to thirty minutes, with relief found by sitting down. He has not checked his blood pressure or blood sugar during these episodes.  No current neurologic signs/symptoms.   He has a history of coronary artery disease and is on several medications including Ranexa , Repatha , Zetia , Plavix , and Toprol  XL 50 mg once daily. He also takes metformin  500 mg twice a day for diabetes. However, he has not been taking his medications regularly since Tuesday due to illness. At a recent cardiology visit, his blood pressure was recorded as 100/56. He has a blood pressure cuff at home but has not been using it regularly.  He recently developed symptoms of an upper respiratory infection starting on Tuesday, including fever, muscle aches, weakness, and a dry cough. He has not received a flu vaccine this season. He reports feeling very weak and has had a poor appetite, consuming only soup and minimal fluids.  He works outside frequently and acknowledges the importance of staying hydrated, especially during the summer months. He has not had a recent kidney function test, with the last GFR recorded at 91 in March. He has not checked his blood sugar during dizzy spells, although he has a glucometer at home. No palpitations, chest pain, or wheezing.      Review of Systems  Constitutional:  Positive for fatigue. Negative for chills.   HENT:  Positive for congestion. Negative for ear pain, sinus pressure and sinus pain.   Respiratory:  Positive for cough. Negative for shortness of breath.   Cardiovascular:  Negative for chest pain and palpitations.  Gastrointestinal:  Negative for abdominal pain, diarrhea, rectal pain and vomiting.  Musculoskeletal:  Negative for back pain and myalgias.  Neurological:  Negative for dizziness.  Hematological:  Negative for adenopathy. Does not bruise/bleed easily.  Psychiatric/Behavioral:  Negative for behavioral problems and decreased concentration.         Objective:   Physical Exam  General Mental Status- Alert. General Appearance- Not in acute distress.   Skin General: Color- Normal Color. Moisture- Normal Moisture.  Neck Carotid Arteries- Normal color. Moisture- Normal Moisture. No carotid bruits. No JVD.  Chest and Lung Exam Auscultation: Breath Sounds:-CTA  Cardiovascular Auscultation:Rythm- RRR Murmurs & Other Heart Sounds:Auscultation of the heart reveals- No Murmurs.  Abdomen Inspection:-Inspeection Normal. Palpation/Percussion:Note:No mass. Palpation and Percussion of the abdomen reveal- Non Tender, Non Distended + BS, no rebound or guarding.   Neurologic Cranial Nerve exam:- CN III-XII intact(No nystagmus), symmetric smile. Drift Test:- No drift. Finger to Nose:- Normal/Intact Strength:- 5/5 equal and symmetric strength both upper and lower extremities.       Assessment & Plan:   Patient Instructions  COVID-19 infection Confirmed with positive test. Symptoms include fever, cough, myalgia, and fatigue. Considering antiviral treatment due to age and potential complications. Discussed potential drug interactions with amlodipine  and Ranexa . - Prescribe Paxlovid  based on previous kidney function numbers. Do not pick up medication until kidney  function results are confirmed. - Hold Ranexa  for the duration of Paxlovid  treatment and an additional three  days. - Adjust amlodipine  dosage to 2.5 mg for six days during Paxlovid  treatment. - Ensure adequate hydration and monitor blood pressure. If bp low/systllic less than 100 hold amlodipine . - Monitor for potential drug interactions, especially with amlodipine .  Cough, myalgia, and fatigue associated with COVID-19 infection Symptoms consistent with COVID-19 infection. - Prescribe cough tablet benzonatate  and  flonase  nasal spray for congestion. - Ensure adequate rest and hydration.  Dizziness Intermittent over two months, not positional. Blood pressure on the lower end, possibly contributing. Differential includes dehydration, low blood pressure, or blood sugar fluctuations. - Check blood pressure and blood sugar during dizzy spells. - Stay hydrated, especially when working outside. - Monitor for associated symptoms like headache, vision changes, or slurred speech. If such occur then ED evaluation.  Essential hypertension Recent blood pressure readings on the lower end. On Toprol  XL 50 mg daily and amlodipine . Blood pressure management complicated by potential interactions with Paxlovid . - Adjust amlodipine  dosage to 2.5 mg for six days during Paxlovid  treatment. - Monitor blood pressure closely, especially during Paxlovid  treatment. Hold amlodipine  if systolic blood pressure drops below 100 mmHg.  Type 2 diabetes mellitus Last A1c of 6.8% eight months ago. No recent blood sugar checks during dizzy spells. - Order A1c test. - Check blood sugar during dizzy spells.  Follow up 10 days or sooner if needed   Dallas Maxwell, PA-C   I personally spent a total of 46 minutes in the care of the patient today including performing a medically appropriate exam/evaluation, counseling and educating, placing orders, referring and communicating with other health care professionals, and documenting clinical information in the EHR.  Extra time spent today consulting with pharmacist on potential med  interaction and modification while on paxlovid .

## 2024-01-30 ENCOUNTER — Other Ambulatory Visit: Payer: Self-pay | Admitting: Cardiovascular Disease

## 2024-02-04 ENCOUNTER — Ambulatory Visit: Admitting: Medical

## 2024-02-04 VITALS — BP 140/70 | HR 57 | Temp 97.8°F | Resp 16 | Ht 73.0 in | Wt 210.6 lb

## 2024-02-04 DIAGNOSIS — E119 Type 2 diabetes mellitus without complications: Secondary | ICD-10-CM

## 2024-02-04 DIAGNOSIS — U071 COVID-19: Secondary | ICD-10-CM | POA: Diagnosis not present

## 2024-02-04 DIAGNOSIS — I10 Essential (primary) hypertension: Secondary | ICD-10-CM

## 2024-02-04 DIAGNOSIS — Z23 Encounter for immunization: Secondary | ICD-10-CM

## 2024-02-04 DIAGNOSIS — Z7984 Long term (current) use of oral hypoglycemic drugs: Secondary | ICD-10-CM

## 2024-02-04 DIAGNOSIS — R143 Flatulence: Secondary | ICD-10-CM

## 2024-02-04 NOTE — Patient Instructions (Addendum)
 COVID-19 infection COVID-19 infection treated early with Paxlovid , symptoms improved significantly. Lingering cough may persist. - Hold Ranexa  for three days post-Paxlovid . - Resume amlodipine  5 mg. - Administer flu vaccine today.  Type 2 diabetes mellitus Type 2 diabetes managed with metformin  and low sugar diet. A1c at 6.7, acceptable for age. Dietary modifications encouraged. - Continue metformin . - Maintain low sugar diet.  Hypertension Hypertension borderline at 140/70 mmHg, possibly affected by reduced amlodipine  dose. and did not take bp med today. - Resume amlodipine  5 mg.  Flatulence No significant dietary triggers identified. Possible causes include carbonated beverages and eating habits. - Avoid excess carbonated beverages. - Consider probiotics over the counter. - Use Gas-X, Fuzeon, or Mylanta as needed.  Flu vaccine today.  Follow up in early february  or sooner if needed

## 2024-02-04 NOTE — Progress Notes (Addendum)
 Subjective:    Patient ID: [name], male    DOB: 10-Aug-1940, 83 y.o.   MRN: [id number]  HPI  [name] is an 83 year old male with type 2 diabetes and hypertension who presents for follow-up after COVID-19 treatment.  He recently completed a course of Paxlovid  for COVID-19, experiencing significant symptom improvement within two days of starting the medication. During treatment, he held his Ranexa  and reduced his amlodipine  dose due to potential drug interactions. He denies sinus pressure and chest congestion, but reports a slight lingering cough.  He has a history of type 2 diabetes, with a recent A1c of 6.7. He manages his diabetes with metformin  and a low sugar diet, although he faces challenges with dietary adherence.  He had reduced his amlodipine  dose to 2 mg during Paxlovid  treatment.  He experiences gas and bloating for the past month or two, occasionally using Gas-X. His diet includes fruits and vegetables in moderation, and he consumes carbonated beverages like Pepsi about once a day. He denies consuming beans, broccoli, cabbage, or cauliflower, and does not drink milk. He does not eat particularly fast, which could contribute to swallowing air.     Review of Systems  Constitutional:  Negative for chills, fatigue and fever.  HENT:  Positive for sinus pressure. Negative for congestion, postnasal drip, sneezing and trouble swallowing.   Respiratory:  Positive for cough. Negative for chest tightness, shortness of breath and wheezing.        Residual.  Cardiovascular:  Negative for chest pain and palpitations.  Gastrointestinal:  Negative for abdominal pain, constipation, nausea and vomiting.       See hpi.  Musculoskeletal:  Negative for back pain and myalgias.  Neurological:  Negative for facial asymmetry, speech difficulty, weakness and numbness.  Hematological:  Negative for adenopathy.  Psychiatric/Behavioral:  Negative for behavioral problems and dysphoric mood. The  patient is not nervous/anxious.     Past Medical History:  Diagnosis Date   Claudication (HCC) 06/27/2011   Left ABIs abnormal values with mild-moderate arterial insufficiency, right SFA moderate amount of mixed density plaque elevating velocities suggestive 50-69% diameter reduction   Coronary artery disease 06/27/2011   Normal Lexiscan , EF 64, post-stress EF 64, no EKG changes   Diabetes mellitus without complication (HCC)    prediabetic   Hyperlipidemia    Hypertension    Impotence of organic origin    Osteoarthritis    Other testicular hypofunction    Peripheral arterial disease (HCC)    PVD (peripheral vascular disease) (HCC)      Social History   Socioeconomic History   Marital status: Married    Spouse name: cathy   Number of children: 1   Years of education: 12   Highest education level: 12th grade  Occupational History   Occupation: retired  Tobacco Use   Smoking status: Former    Current packs/day: 0.00    Average packs/day: 1 pack/day for 50.0 years (50.0 ttl pk-yrs)    Types: Cigarettes    Start date: 12/18/1954    Quit date: 12/17/2004    Years since quitting: 19.1   Smokeless tobacco: Never  Vaping Use   Vaping status: Never Used  Substance and Sexual Activity   Alcohol use: Yes    Comment: occasionally   Drug use: No   Sexual activity: Not on file  Other Topics Concern   Not on file  Social History Narrative   Lives with wife   Caffeine - coffee, 1 cup daily  Social Drivers of Corporate investment banker Strain: Low Risk  (01/28/2024)   Overall Financial Resource Strain (CARDIA)    Difficulty of Paying Living Expenses: Not hard at all  Food Insecurity: No Food Insecurity (01/28/2024)   Hunger Vital Sign    Worried About Running Out of Food in the Last Year: Never true    Ran Out of Food in the Last Year: Never true  Transportation Needs: No Transportation Needs (01/28/2024)   PRAPARE - Administrator, Civil Service (Medical): No     Lack of Transportation (Non-Medical): No  Physical Activity: Inactive (01/28/2024)   Exercise Vital Sign    Days of Exercise per Week: 0 days    Minutes of Exercise per Session: Not on file  Stress: No Stress Concern Present (01/28/2024)   [employer] of Occupational Health - Occupational Stress Questionnaire    Feeling of Stress: Not at all  Social Connections: Moderately Integrated (01/28/2024)   Social Connection and Isolation Panel    Frequency of Communication with Friends and Family: Three times a week    Frequency of Social Gatherings with Friends and Family: Three times a week    Attends Religious Services: 1 to 4 times per year    Active Member of Clubs or Organizations: No    Attends Banker Meetings: Not on file    Marital Status: Married  Intimate Partner Violence: Not At Risk (02/05/2022)   Humiliation, Afraid, Rape, and Kick questionnaire    Fear of Current or Ex-Partner: No    Emotionally Abused: No    Physically Abused: No    Sexually Abused: No    Past Surgical History:  Procedure Laterality Date   ABDOMINAL AORTOGRAM N/A 06/28/2020   Procedure: ABDOMINAL AORTOGRAM;  Surgeon: Court Dorn PARAS, MD;  Location: [facility] IN[facility];  Service: Cardiovascular;  Laterality: N/A;   ABDOMINAL AORTOGRAM W/LOWER EXTREMITY Left 01/27/2022   Procedure: ABDOMINAL AORTOGRAM W/LOWER EXTREMITY;  Surgeon: Sheree Penne Bruckner, MD;  Location: [facility] INVASIVE CV LAB;  Service: Cardiovascular;  Laterality: Left;   CARDIAC CATHETERIZATION  12/26/2004   Left main-90% distal tapered stenosis, left circumflex-95% ostial stenosis, first marginal branch 99% ostial branch, posterior descending artery 99% ostial stenosis, 60-70% segmental mid stenosis, needed CABG   CORONARY ARTERY BYPASS GRAFT  12/27/2004   Left main 3-vessel disease, LIMA to LAD, saphenous vein to circumflexmarginal branch and to the PDA   ENDARTERECTOMY FEMORAL Left 01/28/2022   Procedure: LEFT  ENDARTERECTOMYCOMMON  [facility] BELOW KNEE POPLITEAL;  Surgeon: Sheree Penne Bruckner, MD;  Location: [facility] OR;  Service: Vascular;  Laterality: Left;   FEMORAL-POPLITEAL BYPASS GRAFT Left 01/28/2022   Procedure: LEFT FEMORAL-POPLITEAL ARTERY BYPASS;  Surgeon: Sheree Penne Bruckner, MD;  Location: [facility] OR;  Service: Vascular;  Laterality: Left;   INGUINAL HERNIA REPAIR Left 2005   LEFT HEART CATH AND CORS/GRAFTS ANGIOGRAPHY N/A 07/27/2023   Procedure: LEFT HEART CATH AND CORS/GRAFTS ANGIOGRAPHY;  Surgeon: Mady Bruckner, MD;  Location: [facility];  Service: Cardiovascular;  Laterality: N/A;   LOWER EXTREMITY ANGIOGRAPHY N/A 04/13/2017   Procedure: LOWER EXTREMITY ANGIOGRAPHY;  Surgeon: Court Dorn PARAS, MD;  Location: [facility];  Service: Cardiovascular;  Laterality: N/A;   LOWER EXTREMITY ANGIOGRAPHY Bilateral 12/06/2020   Procedure: Lower Extremity Angiography;  Surgeon: Court Dorn PARAS, MD;  Location: [facility] INVASIVE CV LAB;  Service: Cardiovascular;  Laterality: Bilateral;  Limited Study   LOWER EXTREMITY INTERVENTION N/A 08/04/2016   Procedure: Lower Extremity Intervention;  Surgeon: Dorn JINNY Lesches, MD;  Location: [facility] INVASIVE CV LAB;  Service: Cardiovascular;  Laterality: N/A;   PERIPHERAL VASCULAR ATHERECTOMY Left 04/13/2017   Procedure: PERIPHERAL VASCULAR ATHERECTOMY;  Surgeon: Lesches Dorn JINNY, MD;  Location: [facility] INVASIVE CV LAB;  Service: Cardiovascular;  Laterality: Left;  SFA   PERIPHERAL VASCULAR ATHERECTOMY  12/06/2020   Procedure: PERIPHERAL VASCULAR ATHERECTOMY;  Surgeon: Lesches Dorn JINNY, MD;  Location: [facility] INVASIVE CV LAB;  Service: Cardiovascular;;  Lt Iliac   PERIPHERAL VASCULAR BALLOON ANGIOPLASTY Left 04/13/2017   Procedure: PERIPHERAL VASCULAR BALLOON ANGIOPLASTY;  Surgeon: Lesches Dorn JINNY, MD;  Location: [facility];  Service: Cardiovascular;  Laterality: Left;  SFA   PERIPHERAL VASCULAR INTERVENTION  12/06/2020   Procedure: PERIPHERAL VASCULAR  INTERVENTION;  Surgeon: Lesches Dorn JINNY, MD;  Location: [facility];  Service: Cardiovascular;;  Lt Iliac   RIGHT/LEFT HEART CATH AND CORONARY/GRAFT ANGIOGRAPHY N/A 06/28/2020   Procedure: RIGHT/LEFT HEART CATH AND CORONARY/GRAFT ANGIOGRAPHY;  Surgeon: Lesches Dorn JINNY, MD;  Location: [facility];  Service: Cardiovascular;  Laterality: N/A;    Family History  Problem Relation Age of Onset   Hypertension Mother        76   Clotting disorder Father        55   Diabetes Brother        heart surgery    Allergies  Allergen Reactions   Crestor  [Rosuvastatin ] Other (See Comments)    Myalgias   Lipitor [Atorvastatin] Other (See Comments)    Myalgias    Zestril [Lisinopril] Cough    Current Outpatient Medications on File Prior to Visit  Medication Sig Dispense Refill   amLODipine  (NORVASC ) 10 MG tablet TAKE 1 TABLET BY MOUTH EVERY DAY 90 tablet 3   amLODipine  (NORVASC ) 2.5 MG tablet Take 1 tablet (2.5 mg total) by mouth daily. 6 tablet 0   aspirin  81 MG tablet Take 81 mg by mouth daily.     benzonatate  (TESSALON ) 100 MG capsule Take 1 capsule (100 mg total) by mouth 3 (three) times daily as needed for cough. 30 capsule 0   carboxymethylcellul-glycerin (REFRESH RELIEVA) 0.5-0.9 % ophthalmic solution Place 1 drop into both eyes 2 (two) times daily.     Cholecalciferol  (VITAMIN D ) 50 MCG (2000 UT) tablet Take 2,000 Units by mouth daily.     clopidogrel  (PLAVIX ) 75 MG tablet TAKE 1 TABLET BY MOUTH EVERY DAY 90 tablet 3   Coenzyme Q10 (COQ10) 200 MG CAPS Take 200 mg by mouth daily.     Evolocumab  (REPATHA  SURECLICK) 140 MG/ML SOAJ INJECT 140 MG INTO THE SKIN EVERY 14 (FOURTEEN) DAYS. 6 mL 3   ezetimibe  (ZETIA ) 10 MG tablet TAKE 1 TABLET BY MOUTH EVERY DAY 90 tablet 3   famotidine  (PEPCID ) 20 MG tablet TAKE 1 TABLET BY MOUTH EVERY DAY 90 tablet 1   fluticasone  (FLONASE ) 50 MCG/ACT nasal spray Place 2 sprays into both nostrils daily. 16 g 1   isosorbide  mononitrate (IMDUR )  120 MG 24 hr tablet TAKE 1 TABLET BY MOUTH EVERY DAY 90 tablet 3   levocetirizine (XYZAL ) 5 MG tablet TAKE 1 TABLET BY MOUTH EVERY DAY IN THE EVENING 90 tablet 1   metFORMIN  (GLUCOPHAGE ) 500 MG tablet Take 1 tablet (500 mg total) by mouth 2 (two) times daily with a meal. 180 tablet 0   metoprolol  succinate (TOPROL -XL) 50 MG 24 hr tablet TAKE 1 TABLET (50 MG TOTAL) BY MOUTH TWICE A DAY .TAKE WITH OR IMMEDIATELY FOLLOWING A MEAL  180 tablet 2   nitroGLYCERIN  (NITROSTAT ) 0.4 MG SL tablet Place 1 tablet (0.4 mg total) under the tongue every 5 (five) minutes as needed for chest pain. 25 tablet 3   Omega-3 Fatty Acids (FISH OIL ) 1200 MG CAPS Take 2 capsules (2,400 mg total) by mouth every morning.     ranolazine  (RANEXA ) 500 MG 12 hr tablet Take 1 tablet (500 mg total) by mouth 2 (two) times daily. 180 tablet 3   tamsulosin  (FLOMAX ) 0.4 MG CAPS capsule TAKE 1 CAPSULE BY MOUTH EVERY DAY 90 capsule 1   No current facility-administered medications on file prior to visit.    BP (!) 140/70   Pulse (!) 57   Temp 97.8 F (36.6 C) (Oral)   Resp 16   Ht 6' 1 (1.854 m)   Wt 210 lb 9.6 oz (95.5 kg)   SpO2 95%   BMI 27.79 kg/m        Objective:   Physical Exam  General Mental Status- Alert. General Appearance- Not in acute distress.   Skin General: Color- Normal Color. Moisture- Normal Moisture.  Neck Carotid Arteries- Normal color. Moisture- Normal Moisture. No carotid bruits. No JVD.  Chest and Lung Exam Auscultation: Breath Sounds:-Normal.  Cardiovascular Auscultation:Rythm- Regular. Murmurs & Other Heart Sounds:Auscultation of the heart reveals- No Murmurs.  Abdomen Inspection:-Inspeection Normal. Palpation/Percussion:Note:No mass. Palpation and Percussion of the abdomen reveal- Non Tender, Non Distended + BS, no rebound or guarding.   Neurologic Cranial Nerve exam:- CN III-XII intact(No nystagmus), symmetric smile. Strength:- 5/5 equal and symmetric strength both upper and  lower extremities.    Heent- no sinus pressure to palpation.     Assessment & Plan:   Patient Instructions  COVID-19 infection COVID-19 infection treated early with Paxlovid , symptoms improved significantly. Lingering cough may persist. - Hold Ranexa  for three days post-Paxlovid . - Resume amlodipine  5 mg. - Administer flu vaccine today.  Type 2 diabetes mellitus Type 2 diabetes managed with metformin  and low sugar diet. A1c at 6.7, acceptable for age. Dietary modifications encouraged. - Continue metformin . - Maintain low sugar diet.  Hypertension Hypertension borderline at 140/70 mmHg, possibly affected by reduced amlodipine  dose. and did not take bp med today. - Resume amlodipine  5 mg.  Flatulence No significant dietary triggers identified. Possible causes include carbonated beverages and eating habits. - Avoid excess carbonated beverages. - Consider probiotics over the counter. - Use Gas-X, Fuzeon, or Mylanta as needed.  Flu vaccine today.  Follow up in early february  or sooner if needed    [employer], PA-C

## 2024-02-22 LAB — OPHTHALMOLOGY REPORT-SCANNED

## 2024-02-24 ENCOUNTER — Ambulatory Visit (HOSPITAL_COMMUNITY)
Admission: RE | Admit: 2024-02-24 | Discharge: 2024-02-24 | Disposition: A | Discharge status: Home / Self Care | Source: Ambulatory Visit | Attending: Vascular Surgery | Admitting: Vascular Surgery

## 2024-02-24 ENCOUNTER — Ambulatory Visit (HOSPITAL_COMMUNITY): Admission: RE | Admit: 2024-02-24 | Discharge: 2024-02-24 | Attending: Vascular Surgery

## 2024-02-24 ENCOUNTER — Ambulatory Visit (INDEPENDENT_AMBULATORY_CARE_PROVIDER_SITE_OTHER): Admitting: Physician Assistant

## 2024-02-24 VITALS — BP 110/62 | HR 58 | Temp 97.7°F | Wt 213.1 lb

## 2024-02-24 DIAGNOSIS — I739 Peripheral vascular disease, unspecified: Secondary | ICD-10-CM

## 2024-02-24 LAB — VAS US ABI WITH/WO TBI: Left ABI: 1.2

## 2024-02-24 NOTE — Progress Notes (Signed)
 HISTORY AND PHYSICAL     CC:  follow up. Requesting Provider:  Dorina Loving, PA-C  HPI: This is a 83 y.o. male who is here today for follow up for PAD.  Pt has hx of  left common femoral to below-knee popliteal artery bypass with 6 mm ringed PTFE on 01/28/22 by Dr. 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   Pt was last seen 05/06/2023 and at that time, he was having some lumbar and buttock pain with walking 1/4 mile or less.  He has hx of spinal issues.  He was not having any claudication, rest pain or non healing wounds.    The pt returns today for follow up.  He is doing well and denies any non healing wounds, rest pain or claudication. He does have low back and thigh pain that is attributed to his back.  He is compliant with his asa/plavix /statin.  He quit smoking after his heart surgery in 2006.    The pt is on a statin for cholesterol management.    The pt is on an aspirin .    Other AC:  Plavix  The pt is on CCB, BB for hypertension.  The pt is  on diabetic medication. Tobacco hx:  former    Past Medical History:  Diagnosis Date   Claudication 06/27/2011   Left ABIs abnormal values with mild-moderate arterial insufficiency, right SFA moderate amount of mixed density plaque elevating velocities suggestive 50-69% diameter reduction   Coronary artery disease 06/27/2011   Normal Lexiscan , EF 64, post-stress EF 64, no EKG changes   Diabetes mellitus without complication (HCC)    prediabetic   Hyperlipidemia    Hypertension    Impotence of organic origin    Osteoarthritis    Other testicular hypofunction    Peripheral arterial disease    PVD (peripheral vascular disease)     Past Surgical History:  Procedure Laterality Date   ABDOMINAL AORTOGRAM N/A 06/28/2020   Procedure: ABDOMINAL AORTOGRAM;  Surgeon: Court Dorn PARAS, MD;  Location: [facility] IN[facility]IVE [facility];  Service: Cardiovascular;  Laterality: N/A;   ABDOMINAL AORTOGRAM  W/LOWER EXTREMITY Left 01/27/2022   Procedure: ABDOMINAL AORTOGRAM W/LOWER EXTREMITY;  Surgeon: Sheree Penne Bruckner, MD;  Location: [facility] INVASIVE CV LAB;  Service: Cardiovascular;  Laterality: Left;   CARDIAC CATHETERIZATION  12/26/2004   Left main-90% distal tapered stenosis, left circumflex-95% ostial stenosis, first marginal branch 99% ostial branch, posterior descending artery 99% ostial stenosis, 60-70% segmental mid stenosis, needed CABG   CORONARY ARTERY BYPASS GRAFT  12/27/2004   Left main 3-vessel disease, LIMA to LAD, saphenous vein to circumflexmarginal branch and to the PDA   ENDARTERECTOMY FEMORAL Left 01/28/2022   Procedure: LEFT ENDARTERECTOMYCOMMON  [facility] BELOW KNEE POPLITEAL;  Surgeon: Sheree Penne Bruckner, MD;  Location: [facility] OR;  Service: Vascular;  Laterality: Left;   FEMORAL-POPLITEAL BYPASS GRAFT Left 01/28/2022   Procedure: LEFT FEMORAL-POPLITEAL ARTERY BYPASS;  Surgeon: Sheree Penne Bruckner, MD;  Location: [facility] OR;  Service: Vascular;  Laterality: Left;   INGUINAL HERNIA REPAIR Left 2005   LEFT HEART CATH AND CORS/GRAFTS ANGIOGRAPHY N/A 07/27/2023   Procedure: LEFT HEART CATH AND CORS/GRAFTS ANGIOGRAPHY;  Surgeon: Mady Bruckner, MD;  Location: [facility];  Service: Cardiovascular;  Laterality: N/A;   LOWER EXTREMITY ANGIOGRAPHY N/A 04/13/2017   Procedure: LOWER EXTREMITY ANGIOGRAPHY;  Surgeon: Court Dorn PARAS, MD;  Location: [facility];  Service: Cardiovascular;  Laterality: N/A;   LOWER EXTREMITY ANGIOGRAPHY Bilateral 12/06/2020  Procedure: Lower Extremity Angiography;  Surgeon: Court Dorn PARAS, MD;  Location: [facility] INVASIVE CV LAB;  Service: Cardiovascular;  Laterality: Bilateral;  Limited Study   LOWER EXTREMITY INTERVENTION N/A 08/04/2016   Procedure: Lower Extremity Intervention;  Surgeon: Dorn PARAS Court, MD;  Location: [facility] INVASIVE CV LAB;  Service: Cardiovascular;  Laterality: N/A;   PERIPHERAL VASCULAR ATHERECTOMY Left 04/13/2017    Procedure: PERIPHERAL VASCULAR ATHERECTOMY;  Surgeon: Court Dorn PARAS, MD;  Location: [facility] INVASIVE CV LAB;  Service: Cardiovascular;  Laterality: Left;  SFA   PERIPHERAL VASCULAR ATHERECTOMY  12/06/2020   Procedure: PERIPHERAL VASCULAR ATHERECTOMY;  Surgeon: Court Dorn PARAS, MD;  Location: [facility] INVASIVE CV LAB;  Service: Cardiovascular;;  Lt Iliac   PERIPHERAL VASCULAR BALLOON ANGIOPLASTY Left 04/13/2017   Procedure: PERIPHERAL VASCULAR BALLOON ANGIOPLASTY;  Surgeon: Court Dorn PARAS, MD;  Location: [facility];  Service: Cardiovascular;  Laterality: Left;  SFA   PERIPHERAL VASCULAR INTERVENTION  12/06/2020   Procedure: PERIPHERAL VASCULAR INTERVENTION;  Surgeon: Court Dorn PARAS, MD;  Location: [facility] CV [facility];  Service: Cardiovascular;;  Lt Iliac   RIGHT/LEFT HEART CATH AND CORONARY/GRAFT ANGIOGRAPHY N/A 06/28/2020   Procedure: RIGHT/LEFT HEART CATH AND CORONARY/GRAFT ANGIOGRAPHY;  Surgeon: Court Dorn PARAS, MD;  Location: [facility] INVAS[facility];  Service: Cardiovascular;  Laterality: N/A;    Allergies  Allergen Reactions   Crestor  [Rosuvastatin ] Other (See Comments)    Myalgias   Lipitor [Atorvastatin] Other (See Comments)    Myalgias    Zestril [Lisinopril] Cough    Current Outpatient Medications  Medication Sig Dispense Refill   amLODipine  (NORVASC ) 10 MG tablet TAKE 1 TABLET BY MOUTH EVERY DAY 90 tablet 3   amLODipine  (NORVASC ) 2.5 MG tablet Take 1 tablet (2.5 mg total) by mouth daily. 6 tablet 0   aspirin  81 MG tablet Take 81 mg by mouth daily.     benzonatate  (TESSALON ) 100 MG capsule Take 1 capsule (100 mg total) by mouth 3 (three) times daily as needed for cough. 30 capsule 0   carboxymethylcellul-glycerin (REFRESH RELIEVA) 0.5-0.9 % ophthalmic solution Place 1 drop into both eyes 2 (two) times daily.     Cholecalciferol  (VITAMIN D ) 50 MCG (2000 UT) tablet Take 2,000 Units by mouth daily.     clopidogrel  (PLAVIX ) 75 MG tablet TAKE 1 TABLET BY MOUTH EVERY DAY 90 tablet 3    Coenzyme Q10 (COQ10) 200 MG CAPS Take 200 mg by mouth daily.     Evolocumab  (REPATHA  SURECLICK) 140 MG/ML SOAJ INJECT 140 MG INTO THE SKIN EVERY 14 (FOURTEEN) DAYS. 6 mL 3   ezetimibe  (ZETIA ) 10 MG tablet TAKE 1 TABLET BY MOUTH EVERY DAY 90 tablet 3   famotidine  (PEPCID ) 20 MG tablet TAKE 1 TABLET BY MOUTH EVERY DAY 90 tablet 1   fluticasone  (FLONASE ) 50 MCG/ACT nasal spray Place 2 sprays into both nostrils daily. 16 g 1   isosorbide  mononitrate (IMDUR ) 120 MG 24 hr tablet TAKE 1 TABLET BY MOUTH EVERY DAY 90 tablet 3   levocetirizine (XYZAL ) 5 MG tablet TAKE 1 TABLET BY MOUTH EVERY DAY IN THE EVENING 90 tablet 1   metFORMIN  (GLUCOPHAGE ) 500 MG tablet Take 1 tablet (500 mg total) by mouth 2 (two) times daily with a meal. 180 tablet 0   metoprolol  succinate (TOPROL -XL) 50 MG 24 hr tablet TAKE 1 TABLET (50 MG TOTAL) BY MOUTH TWICE A DAY .TAKE WITH OR IMMEDIATELY FOLLOWING A MEAL 180 tablet 2   nitroGLYCERIN  (NITROSTAT ) 0.4 MG SL tablet Place 1 tablet (0.4  mg total) under the tongue every 5 (five) minutes as needed for chest pain. 25 tablet 3   Omega-3 Fatty Acids (FISH OIL ) 1200 MG CAPS Take 2 capsules (2,400 mg total) by mouth every morning.     ranolazine  (RANEXA ) 500 MG 12 hr tablet Take 1 tablet (500 mg total) by mouth 2 (two) times daily. 180 tablet 3   tamsulosin  (FLOMAX ) 0.4 MG CAPS capsule TAKE 1 CAPSULE BY MOUTH EVERY DAY 90 capsule 1   No current facility-administered medications for this visit.    Family History  Problem Relation Age of Onset   Hypertension Mother        61   Clotting disorder Father        37   Diabetes Brother        heart surgery    Social History   Socioeconomic History   Marital status: Married    Spouse name: cathy   Number of children: 1   Years of education: 12   Highest education level: 12th grade  Occupational History   Occupation: retired  Tobacco Use   Smoking status: Former    Current packs/day: 0.00    Average packs/day: 1 pack/day  for 50.0 years (50.0 ttl pk-yrs)    Types: Cigarettes    Start date: 12/18/1954    Quit date: 12/17/2004    Years since quitting: 19.2   Smokeless tobacco: Never  Vaping Use   Vaping status: Never Used  Substance and Sexual Activity   Alcohol use: Yes    Comment: occasionally   Drug use: No   Sexual activity: Not on file  Other Topics Concern   Not on file  Social History Narrative   Lives with wife   Caffeine - coffee, 1 cup daily   Social Drivers of Health   Financial Resource Strain: Low Risk  (01/28/2024)   Overall Financial Resource Strain (CARDIA)    Difficulty of Paying Living Expenses: Not hard at all  Food Insecurity: No Food Insecurity (01/28/2024)   Hunger Vital Sign    Worried About Running Out of Food in the Last Year: Never true    Ran Out of Food in the Last Year: Never true  Transportation Needs: No Transportation Needs (01/28/2024)   PRAPARE - Administrator, Civil Service (Medical): No    Lack of Transportation (Non-Medical): No  Physical Activity: Inactive (01/28/2024)   Exercise Vital Sign    Days of Exercise per Week: 0 days    Minutes of Exercise per Session: Not on file  Stress: No Stress Concern Present (01/28/2024)   [employer] of Occupational Health - Occupational Stress Questionnaire    Feeling of Stress: Not at all  Social Connections: Moderately Integrated (01/28/2024)   Social Connection and Isolation Panel    Frequency of Communication with Friends and Family: Three times a week    Frequency of Social Gatherings with Friends and Family: Three times a week    Attends Religious Services: 1 to 4 times per year    Active Member of Clubs or Organizations: No    Attends Banker Meetings: Not on file    Marital Status: Married  Catering manager Violence: Not At Risk (02/05/2022)   Humiliation, Afraid, Rape, and Kick questionnaire    Fear of Current or Ex-Partner: No    Emotionally Abused: No    Physically Abused: No     Sexually Abused: No     REVIEW OF SYSTEMS:   [X]  denotes  positive finding, [ ]  denotes negative finding Cardiac  Comments:  Chest pain or chest pressure:    Shortness of breath upon exertion:    Short of breath when lying flat:    Irregular heart rhythm:        Vascular    Pain in calf, thigh, or hip brought on by ambulation:    Pain in feet at night that wakes you up from your sleep:     Blood clot in your veins:    Leg swelling:         Pulmonary    Oxygen at home:    Productive cough:     Wheezing:         Neurologic    Sudden weakness in arms or legs:     Sudden numbness in arms or legs:     Sudden onset of difficulty speaking or slurred speech:    Temporary loss of vision in one eye:     Problems with dizziness:         Gastrointestinal    Blood in stool:     Vomited blood:         Genitourinary    Burning when urinating:     Blood in urine:        Psychiatric    Major depression:         Hematologic    Bleeding problems:    Problems with blood clotting too easily:        Skin    Rashes or ulcers:        Constitutional    Fever or chills:      PHYSICAL EXAMINATION:  Today's Vitals   02/24/24 0846  BP: 110/62  Pulse: (!) 58  Temp: 97.7 F (36.5 C)  TempSrc: Temporal  Weight: 213 lb 1.6 oz (96.7 kg)  PainSc: 0-No pain   Body mass index is 28.12 kg/m.   General:  WDWN in NAD; vital signs documented above Gait: Not observed HENT: WNL, normocephalic Pulmonary: normal non-labored breathing , without wheezing Cardiac: regular HR, without carotid bruits Abdomen: soft, NT; aortic pulse is not palpable Skin: without rashes Vascular Exam/Pulses:  Right Left  Radial 2+ (normal) 2+ (normal)  Femoral 1+ (weak) 2+ (normal)  DP Unable to doppler Brisk biphasic   PT Brisk monophasic Brisk biphasic  Peroneal monophasic Brisk biphasic   Extremities: without ischemic changes, without Gangrene , without cellulitis; without open  wounds Musculoskeletal: no muscle wasting or atrophy  Neurologic: A&O X 3 Psychiatric:  The pt has Normal affect.   Non-Invasive Vascular Imaging:   ABI's/TBI's on 02/24/2024: Right:  [place]/0.33 - Great toe pressure: 45 Left:  1.2/0.71 - Great toe pressure: 97  Arterial duplex on 02/24/2024: Left Graft #1: fem-pop  +--------------------+--------+--------+--------+--------+                     PSV cm/sStenosisWaveformComments  +--------------------+--------+--------+--------+--------+  Inflow             53              biphasic          +--------------------+--------+--------+--------+--------+  Proximal Anastomosis98              biphasic          +--------------------+--------+--------+--------+--------+  Proximal Graft      86              biphasic          +--------------------+--------+--------+--------+--------+  Mid  Graft           83              biphasic          +--------------------+--------+--------+--------+--------+  Distal Graft        86              biphasic          +--------------------+--------+--------+--------+--------+  Distal Anastomosis  167             biphasic          +--------------------+--------+--------+--------+--------+  Outflow            128             biphasic          +--------------------+--------+--------+--------+--------+   Summary:  Left: Widely patent femoropopliteal bypass without evidence of stenosis.   Previous ABI's/TBI's on 05/06/2023: Right:  [place]/0.45 - Great toe pressure: 63 Left:  1.03/1.0 - Great toe pressure:  140  Previous arterial duplex on 05/06/2023: Left: Patent graft with no stenosis     ASSESSMENT/PLAN:: 83 y.o. male here for follow up for PAD with hx of  left common femoral to below-knee popliteal artery bypass with 6 mm ringed PTFE on 01/28/22 by Dr. 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    -pt doing well with  patent bypass without stenosis.  Brisk doppler flow left foot.  -continue asa/statin/plavix  -discussed importance of increased walking daily, which he stays active.  He is limited by his spinal issues.  Discussed to protect his feet.   -pt will f/u in one year with LLE arterial duplex and ABI.  He will call sooner if he develops any rest pain or non healing wounds.  -fortunately, he quit smoking almost 20 years ago.     Lucie Apt, [facility] [phone]  Clinic MD:   Sheree

## 2024-03-21 ENCOUNTER — Encounter: Payer: Self-pay | Admitting: Podiatry

## 2024-03-21 ENCOUNTER — Ambulatory Visit (INDEPENDENT_AMBULATORY_CARE_PROVIDER_SITE_OTHER): Admitting: Podiatry

## 2024-03-21 DIAGNOSIS — M79674 Pain in right toe(s): Secondary | ICD-10-CM | POA: Diagnosis not present

## 2024-03-21 DIAGNOSIS — B351 Tinea unguium: Secondary | ICD-10-CM

## 2024-03-21 DIAGNOSIS — M79675 Pain in left toe(s): Secondary | ICD-10-CM

## 2024-03-21 DIAGNOSIS — I739 Peripheral vascular disease, unspecified: Secondary | ICD-10-CM

## 2024-03-21 DIAGNOSIS — E1142 Type 2 diabetes mellitus with diabetic polyneuropathy: Secondary | ICD-10-CM | POA: Diagnosis not present

## 2024-03-21 NOTE — Progress Notes (Signed)
  Subjective:  Patient ID: [name], male    DOB: 1940/11/15,  MRN: [id number]  Chief Complaint  Patient presents with   [facility] with out callous but really dry skin.  A1c was 6.7 in Sept.  ASA and Plavix .     83 y.o. male presents with the above complaint. History confirmed with patient. Patient presenting with pain related to dystrophic thickened elongated nails. Patient is unable to trim own nails related to nail dystrophy and/or mobility issues. Patient does  have a history of T2DM.  Does report history of left lower extremity bypass. History of CABG. No other pedal complaints.  Patient notes that the nails have been slow to grow.  Last A1c 6.7.  Objective:  Physical Exam:  Nail exam onychomycosis of the toenails, onycholysis, and dystrophic nails Nonpalpable DP and PT pulses bilaterally.  Capillary refill time approximately 3 seconds. Protective sensation absent. Left Foot:  Pain with palpation of nails due to elongation and dystrophic growth. Right Foot: Pain with palpation of nails due to elongation and dystrophic growth.   Assessment:   1. Pain due to onychomycosis of toenails of both feet   2. Diabetic polyneuropathy associated with type 2 diabetes mellitus (HCC)   3. PAD (peripheral artery disease)        Plan:  Patient was evaluated and treated and all questions answered.  #Onychomycosis with pain  -Nails palliatively debrided as below. -Educated on self-care  Procedure: Nail Debridement Rationale: Pain Type of Debridement: manual, sharp debridement. Instrumentation: Nail nipper, rotary burr. Number of Nails: 10   Patient educated on diabetes. Discussed proper diabetic foot care and discussed risks and complications of disease. Educated patient in depth on reasons to return to the office immediately should he/she discover anything concerning or new on the feet. All questions answered. Discussed proper shoes as well.     Return in about 3 months (around  06/21/2024) for Diabetic Foot Care.         Ethan Saddler, DPM [facility] / [facility]

## 2024-03-23 ENCOUNTER — Other Ambulatory Visit: Payer: Self-pay | Admitting: Medical

## 2024-03-26 ENCOUNTER — Other Ambulatory Visit: Payer: Self-pay | Admitting: Medical

## 2024-04-01 MED ORDER — AMLODIPINE BESYLATE 5 MG PO TABS: ORAL_TABLET | ORAL | 0 refills | Status: DC

## 2024-04-01 NOTE — Addendum Note (Signed)
 Addended by: DORINA DALLAS HERO on: 04/01/2024 04:47 PM   Modules accepted: Orders

## 2024-04-01 NOTE — Telephone Encounter (Signed)
 Pt was given lower dose of amlodipine  2.5 mg when he had Covid and I dose adjusted from former 10 mg dose when he had Covid. Pt has been out of med for 2 weeks. I talked with wife and she confirms last dose before reduction was 10 mg before I temporarily reduced. So did Rx 5 mg with instruction to take 2 tab po daily. Also asked to make follow up appt with me in 10 days as want to recheck bp.

## 2024-04-04 NOTE — Telephone Encounter (Signed)
 Called pt got him scheduled for a 10 day bp fu 04/11/24

## 2024-04-11 ENCOUNTER — Ambulatory Visit: Payer: Self-pay | Admitting: Medical

## 2024-04-11 ENCOUNTER — Ambulatory Visit: Admitting: Medical

## 2024-04-11 ENCOUNTER — Encounter: Payer: Self-pay | Admitting: *Deleted

## 2024-04-11 VITALS — BP 107/58 | HR 61 | Temp 98.1°F | Resp 12 | Ht 73.0 in | Wt 218.8 lb

## 2024-04-11 DIAGNOSIS — I10 Essential (primary) hypertension: Secondary | ICD-10-CM

## 2024-04-11 DIAGNOSIS — E119 Type 2 diabetes mellitus without complications: Secondary | ICD-10-CM

## 2024-04-11 DIAGNOSIS — R5383 Other fatigue: Secondary | ICD-10-CM | POA: Diagnosis not present

## 2024-04-11 LAB — CBC WITH DIFFERENTIAL/PLATELET
Basophils Absolute: 0.1 K/uL (ref 0.0–0.1)
Basophils Relative: 0.7 % (ref 0.0–3.0)
Eosinophils Absolute: 0.2 K/uL (ref 0.0–0.7)
Eosinophils Relative: 1.8 % (ref 0.0–5.0)
HCT: 37.7 % — ABNORMAL LOW (ref 39.0–52.0)
Hemoglobin: 12.7 g/dL — ABNORMAL LOW (ref 13.0–17.0)
Lymphocytes Relative: 19.4 % (ref 12.0–46.0)
Lymphs Abs: 1.9 K/uL (ref 0.7–4.0)
MCHC: 33.7 g/dL (ref 30.0–36.0)
MCV: 87.6 fl (ref 78.0–100.0)
Monocytes Absolute: 0.7 K/uL (ref 0.1–1.0)
Monocytes Relative: 7 % (ref 3.0–12.0)
Neutro Abs: 6.9 K/uL (ref 1.4–7.7)
Neutrophils Relative %: 71.1 % (ref 43.0–77.0)
Platelets: 249 K/uL (ref 150.0–400.0)
RBC: 4.31 Mil/uL (ref 4.22–5.81)
RDW: 14.1 % (ref 11.5–15.5)
WBC: 9.7 K/uL (ref 4.0–10.5)

## 2024-04-11 LAB — TSH: TSH: 1.49 u[IU]/mL (ref 0.35–5.50)

## 2024-04-11 LAB — T4, FREE: Free T4: 0.72 ng/dL (ref 0.60–1.60)

## 2024-04-11 LAB — COMPREHENSIVE METABOLIC PANEL WITH GFR
ALT: 8 U/L (ref 0–53)
AST: 10 U/L (ref 0–37)
Albumin: 4.2 g/dL (ref 3.5–5.2)
Alkaline Phosphatase: 55 U/L (ref 39–117)
BUN: 15 mg/dL (ref 6–23)
CO2: 30 meq/L (ref 19–32)
Calcium: 9.2 mg/dL (ref 8.4–10.5)
Chloride: 106 meq/L (ref 96–112)
Creatinine, Ser: 0.72 mg/dL (ref 0.40–1.50)
GFR: 85.38 mL/min (ref >60.00)
Glucose, Bld: 112 mg/dL — ABNORMAL HIGH (ref 70–99)
Potassium: 4.3 meq/L (ref 3.5–5.1)
Sodium: 142 meq/L (ref 135–145)
Total Bilirubin: 1.2 mg/dL (ref 0.2–1.2)
Total Protein: 6.4 g/dL (ref 6.0–8.3)

## 2024-04-11 LAB — MICROALBUMIN / CREATININE URINE RATIO
Creatinine,U: 106.1 mg/dL
Microalb Creat Ratio: 19.3 mg/g (ref 0.0–30.0)
Microalb, Ur: 2.1 mg/dL — ABNORMAL HIGH (ref 0.0–1.9)

## 2024-04-11 LAB — VITAMIN B12: Vitamin B-12: 141 pg/mL — ABNORMAL LOW (ref 211–911)

## 2024-04-11 NOTE — Patient Instructions (Signed)
 Essential hypertension Blood pressure low at 107/50 mmHg, likely due to amlodipine  regimen. No symptoms of lightheadedness or dizziness. Differential contributing factor includes dehydration or anemia. - Reduced amlodipine  to 5 mg once daily. - Ordered CBC and metabolic panel to assess for dehydration or anemia. - Instructed to check blood pressure three times a week and report symptoms. - Ordered urine test for proteinuria.  Fatigue Possible causes include low blood pressure, dehydration, anemia, or vitamin B12 deficiency. - Ordered B12 level and thyroid  function tests.  Follow up date to be determined after lab work review and my chart update on bp readings in about one week.

## 2024-04-11 NOTE — Progress Notes (Signed)
 Subjective:    Patient ID: [name], male    DOB: 03-05-1941, 83 y.o.   MRN: [id number]  HPI  Krist Rosenboom is an 83 year old male with hypertension who presents with concerns about low blood pressure. Recent bp adjustment made after he had covid and recovered. Had to reduce his amlodipine  when he was on paxlovid . On recent former 10 mg dose bp lower than in past.  He notes recent low blood pressure readings, with a measurement of 107/50, but he has no lightheadedness, dizziness, or other hypotensive symptoms.  About two weeks ago, he had fatigue for three days, which has since resolved, and he is unsure if this was related to his blood pressure.  He currently takes amlodipine  5 mg twice daily and Toprol  XL 50 mg twice daily. During a prior COVID-19 infection he was instructed to take two tablets of amlodipine  per day. Before that he was on amlodipine  10 mg dail       Review of Systems  Constitutional:  Negative for chills and fatigue.       2 weeks ago fatigue  Respiratory:  Negative for chest tightness and shortness of breath.   Cardiovascular:  Negative for chest pain and palpitations.  Gastrointestinal:  Negative for abdominal pain.  Genitourinary:  Negative for enuresis and frequency.  Musculoskeletal:  Negative for back pain.  Neurological:  Negative for dizziness, tremors, weakness and light-headedness.  Hematological:  Negative for adenopathy.  Psychiatric/Behavioral:  Negative for behavioral problems and dysphoric mood.      Past Medical History:  Diagnosis Date   Claudication 06/27/2011   Left ABIs abnormal values with mild-moderate arterial insufficiency, right SFA moderate amount of mixed density plaque elevating velocities suggestive 50-69% diameter reduction   Coronary artery disease 06/27/2011   Normal Lexiscan , EF 64, post-stress EF 64, no EKG changes   Diabetes mellitus without complication (HCC)    prediabetic   Hyperlipidemia    Hypertension     Impotence of organic origin    Osteoarthritis    Other testicular hypofunction    Peripheral arterial disease    PVD (peripheral vascular disease)      Social History   Socioeconomic History   Marital status: Married    Spouse name: cathy   Number of children: 1   Years of education: 12   Highest education level: 11th grade  Occupational History   Occupation: retired  Tobacco Use   Smoking status: Former    Current packs/day: 0.00    Average packs/day: 1 pack/day for 50.0 years (50.0 ttl pk-yrs)    Types: Cigarettes    Start date: 12/18/1954    Quit date: 12/17/2004    Years since quitting: 19.3   Smokeless tobacco: Never  Vaping Use   Vaping status: Never Used  Substance and Sexual Activity   Alcohol use: Yes    Comment: occasionally   Drug use: No   Sexual activity: Not on file  Other Topics Concern   Not on file  Social History Narrative   Lives with wife   Caffeine - coffee, 1 cup daily   Social Drivers of Health   Financial Resource Strain: Low Risk  (04/11/2024)   Overall Financial Resource Strain (CARDIA)    Difficulty of Paying Living Expenses: Not hard at all  Food Insecurity: No Food Insecurity (04/11/2024)   Hunger Vital Sign    Worried About Running Out of Food in the Last Year: Never true    Ran Out of Food  in the Last Year: Never true  Transportation Needs: No Transportation Needs (04/11/2024)   PRAPARE - Administrator, Civil Service (Medical): No    Lack of Transportation (Non-Medical): No  Physical Activity: Inactive (04/11/2024)   Exercise Vital Sign    Days of Exercise per Week: 0 days    Minutes of Exercise per Session: Not on file  Stress: No Stress Concern Present (04/11/2024)   [employer] of Occupational Health - Occupational Stress Questionnaire    Feeling of Stress: Not at all  Social Connections: Moderately Integrated (04/11/2024)   Social Connection and Isolation Panel    Frequency of Communication with Friends and  Family: More than three times a week    Frequency of Social Gatherings with Friends and Family: Twice a week    Attends Religious Services: 1 to 4 times per year    Active Member of [employer] or Organizations: No    Attends Banker Meetings: Not on file    Marital Status: Married  Intimate Partner Violence: Not At Risk (02/05/2022)   Humiliation, Afraid, Rape, and Kick questionnaire    Fear of Current or Ex-Partner: No    Emotionally Abused: No    Physically Abused: No    Sexually Abused: No    Past Surgical History:  Procedure Laterality Date   ABDOMINAL AORTOGRAM N/A 06/28/2020   Procedure: ABDOMINAL AORTOGRAM;  Surgeon: Court Dorn PARAS, MD;  Location: [facility];  Service: Cardiovascular;  Laterality: N/A;   ABDOMINAL AORTOGRAM W/LOWER EXTREMITY Left 01/27/2022   Procedure: ABDOMINAL AORTOGRAM W/LOWER EXTREMITY;  Surgeon: Sheree Penne Bruckner, MD;  Location: [facility] INVASIVE CV LAB;  Service: Cardiovascular;  Laterality: Left;   CARDIAC CATHETERIZATION  12/26/2004   Left main-90% distal tapered stenosis, left circumflex-95% ostial stenosis, first marginal branch 99% ostial branch, posterior descending artery 99% ostial stenosis, 60-70% segmental mid stenosis, needed CABG   CORONARY ARTERY BYPASS GRAFT  12/27/2004   Left main 3-vessel disease, LIMA to LAD, saphenous vein to circumflexmarginal branch and to the PDA   ENDARTERECTOMY FEMORAL Left 01/28/2022   Procedure: LEFT ENDARTERECTOMYCOMMON  [facility] BELOW KNEE POPLITEAL;  Surgeon: Sheree Penne Bruckner, MD;  Location: [facility] OR;  Service: Vascular;  Laterality: Left;   FEMORAL-POPLITEAL BYPASS GRAFT Left 01/28/2022   Procedure: LEFT FEMORAL-POPLITEAL ARTERY BYPASS;  Surgeon: Sheree Penne Bruckner, MD;  Location: [facility] OR;  Service: Vascular;  Laterality: Left;   INGUINAL HERNIA REPAIR Left 2005   LEFT HEART CATH AND CORS/GRAFTS ANGIOGRAPHY N/A 07/27/2023   Procedure: LEFT HEART CATH AND CORS/GRAFTS ANGIOGRAPHY;   Surgeon: Mady Bruckner, MD;  Location: [facility];  Service: Cardiovascular;  Laterality: N/A;   LOWER EXTREMITY ANGIOGRAPHY N/A 04/13/2017   Procedure: LOWER EXTREMITY ANGIOGRAPHY;  Surgeon: Court Dorn PARAS, MD;  Location: [facility];  Service: Cardiovascular;  Laterality: N/A;   LOWER EXTREMITY ANGIOGRAPHY Bilateral 12/06/2020   Procedure: Lower Extremity Angiography;  Surgeon: Court Dorn PARAS, MD;  Location: [facility] INVASIVE CV LAB;  Service: Cardiovascular;  Laterality: Bilateral;  Limited Study   LOWER EXTREMITY INTERVENTION N/A 08/04/2016   Procedure: Lower Extremity Intervention;  Surgeon: Dorn PARAS Court, MD;  Location: [facility] INVASIVE CV LAB;  Service: Cardiovascular;  Laterality: N/A;   PERIPHERAL VASCULAR ATHERECTOMY Left 04/13/2017   Procedure: PERIPHERAL VASCULAR ATHERECTOMY;  Surgeon: Court Dorn PARAS, MD;  Location: [facility] INVASIVE CV LAB;  Service: Cardiovascular;  Laterality: Left;  SFA   PERIPHERAL VASCULAR ATHERECTOMY  12/06/2020   Procedure: PERIPHERAL VASCULAR ATHERECTOMY;  Surgeon: Court Dorn PARAS, MD;  Location: [facility] INVASIVE CV LAB;  Service: Cardiovascular;;  Lt Iliac   PERIPHERAL VASCULAR BALLOON ANGIOPLASTY Left 04/13/2017   Procedure: PERIPHERAL VASCULAR BALLOON ANGIOPLASTY;  Surgeon: Court Dorn PARAS, MD;  Location: [facility];  Service: Cardiovascular;  Laterality: Left;  SFA   PERIPHERAL VASCULAR INTERVENTION  12/06/2020   Procedure: PERIPHERAL VASCULAR INTERVENTION;  Surgeon: Court Dorn PARAS, MD;  Location: [facility];  Service: Cardiovascular;;  Lt Iliac   RIGHT/LEFT HEART CATH AND CORONARY/GRAFT ANGIOGRAPHY N/A 06/28/2020   Procedure: RIGHT/LEFT HEART CATH AND CORONARY/GRAFT ANGIOGRAPHY;  Surgeon: Court Dorn PARAS, MD;  Location: [facility];  Service: Cardiovascular;  Laterality: N/A;    Family History  Problem Relation Age of Onset   Hypertension Mother        34   Clotting disorder Father        79   Diabetes Brother         heart surgery    Allergies  Allergen Reactions   Crestor  [Rosuvastatin ] Other (See Comments)    Myalgias   Lipitor [Atorvastatin] Other (See Comments)    Myalgias    Zestril [Lisinopril] Cough    Current Outpatient Medications on File Prior to Visit  Medication Sig Dispense Refill   amLODipine  (NORVASC ) 5 MG tablet 2 tab po daily 60 tablet 0   aspirin  81 MG tablet Take 81 mg by mouth daily.     Cholecalciferol  (VITAMIN D ) 50 MCG (2000 UT) tablet Take 2,000 Units by mouth daily.     clopidogrel  (PLAVIX ) 75 MG tablet TAKE 1 TABLET BY MOUTH EVERY DAY 90 tablet 3   Coenzyme Q10 (COQ10) 200 MG CAPS Take 200 mg by mouth daily.     Evolocumab  (REPATHA  SURECLICK) 140 MG/ML SOAJ INJECT 140 MG INTO THE SKIN EVERY 14 (FOURTEEN) DAYS. 6 mL 3   ezetimibe  (ZETIA ) 10 MG tablet TAKE 1 TABLET BY MOUTH EVERY DAY 90 tablet 3   famotidine  (PEPCID ) 20 MG tablet TAKE 1 TABLET BY MOUTH EVERY DAY 90 tablet 1   isosorbide  mononitrate (IMDUR ) 120 MG 24 hr tablet TAKE 1 TABLET BY MOUTH EVERY DAY 90 tablet 3   levocetirizine (XYZAL ) 5 MG tablet TAKE 1 TABLET BY MOUTH EVERY DAY IN THE EVENING 90 tablet 1   metFORMIN  (GLUCOPHAGE ) 500 MG tablet TAKE 1 TABLET BY MOUTH 2 TIMES DAILY WITH A MEAL. 180 tablet 0   metoprolol  succinate (TOPROL -XL) 50 MG 24 hr tablet TAKE 1 TABLET (50 MG TOTAL) BY MOUTH TWICE A DAY .TAKE WITH OR IMMEDIATELY FOLLOWING A MEAL 180 tablet 2   nitroGLYCERIN  (NITROSTAT ) 0.4 MG SL tablet Place 1 tablet (0.4 mg total) under the tongue every 5 (five) minutes as needed for chest pain. 25 tablet 3   Omega-3 Fatty Acids (FISH OIL ) 1200 MG CAPS Take 2 capsules (2,400 mg total) by mouth every morning.     ranolazine  (RANEXA ) 500 MG 12 hr tablet Take 1 tablet (500 mg total) by mouth 2 (two) times daily. 180 tablet 3   tamsulosin  (FLOMAX ) 0.4 MG CAPS capsule TAKE 1 CAPSULE BY MOUTH EVERY DAY 90 capsule 1   No current facility-administered medications on file prior to visit.    BP (!) 107/58    Pulse 61   Temp 98.1 F (36.7 C) (Oral)   Resp 12   Ht 6' 1 (1.854 m)   Wt 218 lb 12.8 oz (99.2 kg) Comment: w/ shoes  SpO2 96%   BMI 28.87  kg/m        Objective:   Physical Exam  General Mental Status- Alert. General Appearance- Not in acute distress.   Skin General: Color- Normal Color. Moisture- Normal Moisture.  Neck Carotid Arteries- Normal color. Moisture- Normal Moisture. No carotid bruits. No JVD.  Chest and Lung Exam Auscultation: Breath Sounds:-Normal.  Cardiovascular Auscultation:Rythm- Regular. Murmurs & Other Heart Sounds:Auscultation of the heart reveals- No Murmurs.    Neurologic Cranial Nerve exam:- CN III-XII intact(No nystagmus), symmetric smile. Strength:- 5/5 equal and symmetric strength both upper and lower extremities.       Assessment & Plan:   Essential hypertension Blood pressure low at 107/50 mmHg, likely due to amlodipine  regimen. No symptoms of lightheadedness or dizziness. Differential contributing factor includes dehydration or anemia. - Reduced amlodipine  to 5 mg once daily. - Ordered CBC and metabolic panel to assess for dehydration or anemia. - Instructed to check blood pressure three times a week and report symptoms. - Ordered urine test for proteinuria.  Fatigue Possible causes include low blood pressure, dehydration, anemia, or vitamin B12 deficiency. - Ordered B12 level and thyroid  function tests.  Follow up date to be determined after lab work review and my chart update on bp readings in about one week.    Tamiyah Moulin, PA-C

## 2024-04-15 LAB — VITAMIN B1: Vitamin B1 (Thiamine): <6 nmol/L — ABNORMAL LOW (ref 8–30)

## 2024-04-26 ENCOUNTER — Other Ambulatory Visit: Payer: Self-pay | Admitting: Medical

## 2024-05-06 ENCOUNTER — Telehealth: Payer: Self-pay | Admitting: Medical

## 2024-05-06 ENCOUNTER — Ambulatory Visit

## 2024-05-06 VITALS — BP 110/57 | HR 64 | Temp 97.9°F | Resp 18 | Ht 73.0 in | Wt 213.8 lb

## 2024-05-06 DIAGNOSIS — Z0000 Encounter for general adult medical examination without abnormal findings: Secondary | ICD-10-CM

## 2024-05-06 NOTE — Telephone Encounter (Signed)
 Pt had some complaints on awv. Does he need appointment?

## 2024-05-06 NOTE — Progress Notes (Signed)
 "  Chief Complaint  Patient presents with   Medicare Wellness     Subjective:   [name] is a 83 y.o. male who presents for a Medicare Annual Wellness Visit.  Visit info / Clinical Intake: Medicare Wellness Visit Type:: Subsequent Annual Wellness Visit Persons participating in visit and providing information:: patient Medicare Wellness Visit Mode:: In-person (required for WTM) Interpreter Needed?: No Pre-visit prep was completed: yes AWV questionnaire completed by patient prior to visit?: yes Date:: 05/03/24 Living arrangements:: (Patient-Rptd) lives with spouse/significant other Patient's Overall Health Status Rating: (!) (Patient-Rptd) fair Typical amount of pain: some (back pain when walking) Does pain affect daily life?: (!) (Patient-Rptd) yes Are you currently prescribed opioids?: no  Dietary Habits and Nutritional Risks How many meals a day?: (Patient-Rptd) 3 Eats fruit and vegetables daily?: (Patient-Rptd) yes Most meals are obtained by: (Patient-Rptd) preparing own meals; having others provide food In the last 2 weeks, have you had any of the following?: (!) nausea, vomiting, diarrhea (due to the flu) Diabetic:: (!) yes Any non-healing wounds?: no How often do you check your BS?: 0 Would you like to be referred to a Nutritionist or for Diabetic Management? : no  Functional Status Activities of Daily Living (to include ambulation/medication): (Patient-Rptd) Independent Ambulation: (Patient-Rptd) Independent Medication Administration: (Patient-Rptd) Independent Home Management (perform basic housework or laundry): (Patient-Rptd) Independent Manage your own finances?: (Patient-Rptd) yes Primary transportation is: (Patient-Rptd) driving Concerns about vision?: no *vision screening is required for WTM* (up to date with Dr Malvina, will request record) Concerns about hearing?: (!) yes (pt states he is fine at current hearing level) Uses hearing aids?: no  Fall  Screening Falls in the past year?: (Patient-Rptd) 0 Number of falls in past year: 0 Was there an injury with Fall?: 0 Fall Risk Category Calculator: 0 Patient Fall Risk Level: Low Fall Risk  Fall Risk Patient at Risk for Falls Due to: Orthopedic patient Fall risk Follow up: Falls evaluation completed  Home and Transportation Safety: All rugs have non-skid backing?: (Patient-Rptd) N/A, no rugs All stairs or steps have railings?: (Patient-Rptd) yes Grab bars in the bathtub or shower?: (Patient-Rptd) yes Have non-skid surface in bathtub or shower?: (Patient-Rptd) yes Good home lighting?: (Patient-Rptd) yes Regular seat belt use?: (Patient-Rptd) yes Hospital stays in the last year:: (Patient-Rptd) no  Cognitive Assessment Difficulty concentrating, remembering, or making decisions? : (Patient-Rptd) no Will 6CIT or Mini Cog be Completed: yes What year is it?: 0 points What month is it?: 0 points Give patient an address phrase to remember (5 components): [address] [place]  About what time is it?: 0 points Count backwards from 20 to 1: 0 points Say the months of the year in reverse: 4 points Repeat the address phrase from earlier: 2 points 6 CIT Score: 6 points  Advance Directives (For Healthcare) Does Patient Have a Medical Advance Directive?: No Would patient like information on creating a medical advance directive?: No - Patient declined  Reviewed/Updated  Reviewed/Updated: Reviewed All (Medical, Surgical, Family, Medications, Allergies, Care Teams, Patient Goals)    Allergies (verified) Crestor  [rosuvastatin ], Lipitor [atorvastatin], and Zestril [lisinopril]   Current Medications (verified) Outpatient Encounter Medications as of 05/06/2024  Medication Sig   amLODipine  (NORVASC ) 5 MG tablet TAKE 2 TABLETS BY MOUTH EVERY DAY   aspirin  81 MG tablet Take 81 mg by mouth daily.   Cholecalciferol  (VITAMIN D ) 50 MCG (2000 UT) tablet Take 2,000 Units by mouth daily.    clopidogrel  (PLAVIX ) 75 MG tablet TAKE 1 TABLET  BY MOUTH EVERY DAY   Coenzyme Q10 (COQ10) 200 MG CAPS Take 200 mg by mouth daily.   Evolocumab  (REPATHA  SURECLICK) 140 MG/ML SOAJ INJECT 140 MG INTO THE SKIN EVERY 14 (FOURTEEN) DAYS.   ezetimibe  (ZETIA ) 10 MG tablet TAKE 1 TABLET BY MOUTH EVERY DAY   famotidine  (PEPCID ) 20 MG tablet TAKE 1 TABLET BY MOUTH EVERY DAY   isosorbide  mononitrate (IMDUR ) 120 MG 24 hr tablet TAKE 1 TABLET BY MOUTH EVERY DAY   levocetirizine (XYZAL ) 5 MG tablet TAKE 1 TABLET BY MOUTH EVERY DAY IN THE EVENING   metFORMIN  (GLUCOPHAGE ) 500 MG tablet TAKE 1 TABLET BY MOUTH 2 TIMES DAILY WITH A MEAL.   metoprolol  succinate (TOPROL -XL) 50 MG 24 hr tablet TAKE 1 TABLET (50 MG TOTAL) BY MOUTH TWICE A DAY .TAKE WITH OR IMMEDIATELY FOLLOWING A MEAL   nitroGLYCERIN  (NITROSTAT ) 0.4 MG SL tablet Place 1 tablet (0.4 mg total) under the tongue every 5 (five) minutes as needed for chest pain.   Omega-3 Fatty Acids (FISH OIL ) 1200 MG CAPS Take 2 capsules (2,400 mg total) by mouth every morning.   ranolazine  (RANEXA ) 500 MG 12 hr tablet Take 1 tablet (500 mg total) by mouth 2 (two) times daily.   tamsulosin  (FLOMAX ) 0.4 MG CAPS capsule TAKE 1 CAPSULE BY MOUTH EVERY DAY   No facility-administered encounter medications on file as of 05/06/2024.    History: Past Medical History:  Diagnosis Date   Claudication 06/27/2011   Left ABIs abnormal values with mild-moderate arterial insufficiency, right SFA moderate amount of mixed density plaque elevating velocities suggestive 50-69% diameter reduction   Coronary artery disease 06/27/2011   Normal Lexiscan , EF 64, post-stress EF 64, no EKG changes   Diabetes mellitus without complication (HCC)    prediabetic   Hyperlipidemia    Hypertension    Impotence of organic origin    Osteoarthritis    Other testicular hypofunction    Peripheral arterial disease    PVD (peripheral vascular disease)    Past Surgical History:  Procedure Laterality  Date   ABDOMINAL AORTOGRAM N/A 06/28/2020   Procedure: ABDOMINAL AORTOGRAM;  Surgeon: Court Dorn PARAS, MD;  Location: [facility] INVASIVE CV LAB;  Service: Cardiovascular;  Laterality: N/A;   ABDOMINAL AORTOGRAM W/LOWER EXTREMITY Left 01/27/2022   Procedure: ABDOMINAL AORTOGRAM W/LOWER EXTREMITY;  Surgeon: Sheree Penne Bruckner, MD;  [facility] INVASIVE CV LAB;  Service: Cardiovascular;  Laterality: Left;   CARDIAC CATHETERIZATION  12/26/2004   Left main-90% distal tapered stenosis, left circumflex-95% ostial stenosis, first marginal branch 99% ostial branch, posterior descending artery 99% ostial stenosis, 60-70% segmental mid stenosis, needed CABG   CORONARY ARTERY BYPASS GRAFT  12/27/2004   Left main 3-vessel disease, LIMA to LAD, saphenous vein to circumflexmarginal branch and to the PDA   ENDARTERECTOMY FEMORAL Left 01/28/2022   Procedure: LEFT ENDARTERECTOMYCOMMON  [facility] BELOW KNEE POPLITEAL;  Surgeon: Sheree Penne Bruckner, MD;  Location: [facility] OR;  Service: Vascular;  Laterality: Left;   FEMORAL-POPLITEAL BYPASS GRAFT Left 01/28/2022   Procedure: LEFT FEMORAL-POPLITEAL ARTERY BYPASS;  Surgeon: Sheree Penne Bruckner, MD;  Location: [facility] OR;  Service: Vascular;  Laterality: Left;   INGUINAL HERNIA REPAIR Left 2005   LEFT HEART CATH AND CORS/GRAFTS ANGIOGRAPHY N/A 07/27/2023   Procedure: LEFT HEART CATH AND CORS/GRAFTS ANGIOGRAPHY;  Surgeon: Mady Bruckner, MD;  Location: [facility] INVASIVE CV LAB;  Service: Cardiovascular;  Laterality: N/A;   LOWER EXTREMITY ANGIOGRAPHY N/A 04/13/2017   Procedure: LOWER EXTREMITY ANGIOGRAPHY;  Surgeon: Court Dorn PARAS, MD;  Location: [facility] INVASIVE CV LAB;  Service: Cardiovascular;  Laterality: N/A;   LOWER EXTREMITY ANGIOGRAPHY Bilateral 12/06/2020   Procedure: Lower Extremity Angiography;  Surgeon: Court Dorn PARAS, MD;  Location[facility]VASIVE CV LAB;  Service: Cardiovascular;  Laterality: Bilateral;  Limited Study   LOWER EXTREMITY INTERVENTION N/A 08/04/2016    Procedure: Lower Extremity Intervention;  Surgeon: Dorn PARAS Court, MD;  Location: [facility] INVASIVE CV LAB;  Service: Cardiovascular;  Laterality: N/A;   PERIPHERAL VASCULAR ATHERECTOMY Left 04/13/2017   Procedure: PERIPHERAL VASCULAR ATHERECTOMY;  Surgeon: Court Dorn PARAS, MD;  Location: [facility] INVASIVE CV LAB;  Service: Cardiovascular;  Laterality: Left;  SFA   PERIPHERAL VASCULAR ATHERECTOMY  12/06/2020   Procedure: PERIPHERAL VASCULAR ATHERECTOMY;  Surgeon: Court Dorn PARAS, MD;  Location: [facility] INVASIVE CV LAB;  Service: Cardiovascular;;  Lt Iliac   PERIPHERAL VASCULAR BALLOON ANGIOPLASTY Left 04/13/2017   Procedure: PERIPHERAL VASCULAR BALLOON ANGIOPLASTY;  Surgeon: Court Dorn PARAS, MD;  Location: [facility] IN[facility];  Service: Cardiovascular;  Laterality: Left;  SFA   PERIPHERAL VASCULAR INTERVENTION  12/06/2020   Procedure: PERIPHERAL VASCULAR INTERVENTION;  Surgeon: Court Dorn PARAS, MD;  Location: [facility] INVASIVE CV LAB;  Service: Cardiovascular;;  Lt Iliac   RIGHT/LEFT HEART CATH AND CORONARY/GRAFT ANGIOGRAPHY N/A 06/28/2020   Procedure: RIGHT/LEFT HEART CATH AND CORONARY/GRAFT ANGIOGRAPHY;  Surgeon: Court Dorn PARAS, MD;  Location: [facility] INVASIVE CV LAB;  Service: Cardiovascular;  Laterality: N/A;   Family History  Problem Relation Age of Onset   Hypertension Mother        67   Clotting disorder Father        41   Diabetes Brother        heart surgery   Social History   Occupational History   Occupation: retired  Tobacco Use   Smoking status: Former    Current packs/day: 0.00    Average packs/day: 1 pack/day for 50.0 years (50.0 ttl pk-yrs)    Types: Cigarettes    Start date: 12/18/1954    Quit date: 12/17/2004    Years since quitting: 19.3   Smokeless tobacco: Never  Vaping Use   Vaping status: Never Used  Substance and Sexual Activity   Alcohol use: Yes    Comment: occasionally   Drug use: No   Sexual activity: Not on file   Tobacco Counseling Counseling given: Not  Answered  SDOH Screenings   Food Insecurity: No Food Insecurity (05/06/2024)  Housing: Low Risk (05/06/2024)  Transportation Needs: No Transportation Needs (05/06/2024)  Utilities: Not At Risk (05/06/2024)  Alcohol Screen: Low Risk (04/11/2024)  Depression (PHQ2-9): Low Risk (05/06/2024)  Financial Resource Strain: Low Risk (04/11/2024)  Physical Activity: Inactive (05/06/2024)  Social Connections: Moderately Integrated (05/06/2024)  Stress: No Stress Concern Present (05/06/2024)  Tobacco Use: Medium Risk (05/06/2024)   See flowsheets for full screening details  Depression Screen PHQ 2 & 9 Depression Scale- Over the past 2 weeks, how often have you been bothered by any of the following problems? Little interest or pleasure in doing things: 0 Feeling down, depressed, or hopeless (PHQ Adolescent also includes...irritable): 0 PHQ-2 Total Score: 0 Trouble falling or staying asleep, or sleeping too much: 0 Feeling tired or having little energy: 0 Poor appetite or overeating (PHQ Adolescent also includes...weight loss): 0 Feeling bad about yourself - or that you are a failure or have let yourself or your family down: 0 Trouble concentrating on things, such as reading the newspaper or watching television (PHQ Adolescent also includes...like school work): 0 Moving  or speaking so slowly that other people could have noticed. Or the opposite - being so fidgety or restless that you have been moving around a lot more than usual: 0 Thoughts that you would be better off dead, or of hurting yourself in some way: 0 PHQ-9 Total Score: 0 If you checked off any problems, how difficult have these problems made it for you to do your work, take care of things at home, or get along with other people?: Not difficult at all     Goals Addressed   None          Objective:    Today's Vitals   05/06/24 1007  BP: (!) 110/57  Pulse: 64  Resp: 18  Temp: 97.9 F (36.6 C)  TempSrc: Oral  SpO2: (!)  64%  Weight: 213 lb 12.8 oz (97 kg)  Height: 6' 1 (1.854 m)   Body mass index is 28.21 kg/m.  Hearing/Vision screen No results found. Immunizations and Health Maintenance Health Maintenance  Topic Date Due   OPHTHALMOLOGY EXAM  03/26/2023   Zoster Vaccines- Shingrix (1 of 2) 07/12/2024 (Originally 04/09/1992)   COVID-19 Vaccine (4 - 2025-26 season) 03/11/2025 (Originally 01/18/2024)   DTaP/Tdap/Td (1 - Tdap) 04/11/2025 (Originally 04/09/1960)   HEMOGLOBIN A1C  07/27/2024   FOOT EXAM  08/17/2024   Diabetic kidney evaluation - eGFR measurement  04/11/2025   Diabetic kidney evaluation - Urine ACR  04/11/2025   Medicare Annual Wellness (AWV)  05/06/2025   Pneumococcal Vaccine: 50+ Years  Completed   Influenza Vaccine  Completed   Meningococcal B Vaccine  Aged Out        Assessment/Plan:  This is a routine wellness examination for [place].  Patient Care Team: Saguier, Edward, PA-C as PCP - General (Internal Medicine) Court Dorn PARAS, MD as PCP - Cardiology (Cardiology) Oneil Dixons (Optometry) Pleak, [place], DPM as Consulting Physician (Podiatry)  I have personally reviewed and noted the following in the patients chart:   Medical and social history Use of alcohol, tobacco or illicit drugs  Current medications and supplements including opioid prescriptions. Functional ability and status Nutritional status Physical activity Advanced directives List of other physicians Hospitalizations, surgeries, and ER visits in previous 12 months Vitals Screenings to include cognitive, depression, and falls Referrals and appointments  No orders of the defined types were placed in this encounter.  In addition, I have reviewed and discussed with patient certain preventive protocols, quality metrics, and best practice recommendations. A written personalized care plan for preventive services as well as general preventive health recommendations were provided to patient.   Lolita Libra, CMA   05/06/2024   Return in 1 year (on 05/06/2025).  After Visit Summary: (In Person-Printed) AVS printed and given to the patient  Nurse Notes: HM Addressed: requested DM eye exam from January.  "

## 2024-05-06 NOTE — Patient Instructions (Addendum)
 Mr. Woolsey,  Thank you for taking the time for your Medicare Wellness Visit. I appreciate your continued commitment to your health goals. Please review the care plan we discussed, and feel free to reach out if I can assist you further.  Please note that Annual Wellness Visits do not include a physical exam. Some assessments may be limited, especially if the visit was conducted virtually. If needed, we may recommend an in-person follow-up with your provider.  Ongoing Care Seeing your primary care provider every 3 to 6 months helps us  monitor your health and provide consistent, personalized care.   Dallas Maxwell, PA-C: 07/07/24 8am Medicare AWV:  05/10/25 10:20am, in person  Referrals If a referral was made during today's visit and you haven't received any updates within two weeks, please contact the referred provider directly to check on the status.  I will call to get your most recent Diabetic eye exam result.  Recommended Screenings:  Health Maintenance  Topic Date Due   Eye exam for diabetics  03/26/2023   Medicare Annual Wellness Visit  06/20/2024*   Zoster (Shingles) Vaccine (1 of 2) 07/12/2024*   COVID-19 Vaccine (4 - 2025-26 season) 03/11/2025*   DTaP/Tdap/Td vaccine (1 - Tdap) 04/11/2025*   Hemoglobin A1C  07/27/2024   Complete foot exam   08/17/2024   Yearly kidney function blood test for diabetes  04/11/2025   Yearly kidney health urinalysis for diabetes  04/11/2025   Pneumococcal Vaccine for age over 36  Completed   Flu Shot  Completed   Meningitis B Vaccine  Aged Out  *Topic was postponed. The date shown is not the original due date.       05/03/2024   10:00 AM  Advanced Directives  Does Patient Have a Medical Advance Directive? No  Would patient like information on creating a medical advance directive? No - Patient declined   Please bring a copy of your health care power of attorney and living will to the office to be added to your chart at your convenience. You  can mail a copy to [facility] [address] [place], [place] [address] or email to [email or web address]  Vision: Annual vision screenings are recommended for early detection of glaucoma, cataracts, and diabetic retinopathy. These exams can also reveal signs of chronic conditions such as diabetes and high blood pressure.  Dental: Annual dental screenings help detect early signs of oral cancer, gum disease, and other conditions linked to overall health, including heart disease and diabetes.  Please see the attached documents for additional preventive care recommendations.

## 2024-05-09 NOTE — Telephone Encounter (Signed)
 I do not recall him mentioning any present concerns.  He mentioned he had been diagnosed with flu 1 1/2- 2wks prior to our visit and did have some n/v during that time. He is scheduled to see you for follow up on 07/07/24.

## 2024-05-10 ENCOUNTER — Other Ambulatory Visit: Payer: Self-pay | Admitting: Medical

## 2024-05-25 ENCOUNTER — Ambulatory Visit: Attending: Cardiovascular Disease | Admitting: Cardiovascular Disease

## 2024-05-25 ENCOUNTER — Encounter: Payer: Self-pay | Admitting: Cardiovascular Disease

## 2024-05-25 VITALS — BP 108/62 | HR 63 | Ht 73.0 in

## 2024-05-25 DIAGNOSIS — I10 Essential (primary) hypertension: Secondary | ICD-10-CM

## 2024-05-25 DIAGNOSIS — E785 Hyperlipidemia, unspecified: Secondary | ICD-10-CM | POA: Diagnosis not present

## 2024-05-25 DIAGNOSIS — Z951 Presence of aortocoronary bypass graft: Secondary | ICD-10-CM | POA: Diagnosis not present

## 2024-05-25 DIAGNOSIS — I739 Peripheral vascular disease, unspecified: Secondary | ICD-10-CM | POA: Diagnosis not present

## 2024-05-25 MED ORDER — PANTOPRAZOLE SODIUM 40 MG PO TBEC: 40.0000 mg | DELAYED_RELEASE_TABLET | Freq: Every day | ORAL | 3 refills | Status: AC

## 2024-05-25 NOTE — Assessment & Plan Note (Signed)
 Complex history of PAD with known occlusion of both SFAs and orbital atherectomy/PTA and stenting of a calcified left iliac artery 12/06/2020.  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 Sheree who performed left common femoral and tibioperoneal trunk endarterectomy and bypass grafting with a 6 mm PT FV graft to below below the knee popliteal resulting in an healing of his wound.  Dr. Sheree  currently follows his noninvasive studies.  He denies claudication.

## 2024-05-25 NOTE — Assessment & Plan Note (Signed)
 History of dyslipidemia on Repatha  with a lipid profile performed 11/10/2023 revealing total cholesterol of 68, LDL of 60 and HDL 41.

## 2024-05-25 NOTE — Assessment & Plan Note (Signed)
 History of CAD status post CABG by Dr. Dusty  12/27/2004 for left main/three-vessel disease.  He had a LIMA to his LAD, vein to circumflex marginal and to the PDA.  His last cath performed by Dr. Mady 07/27/2023 did reveal progression of his left main disease which jeopardized his first diagonal branch, patent LIMA to the LAD and patent vein to OM 3 and to the left PDA.  He had a left dominant system.  He does have occasional nocturnal chest pain when lying in bed.  I wonder whether this is reflux.  He is on good antianginal medications.  I am going to empirically begin him on Protonix  40 mg a day.

## 2024-05-25 NOTE — Progress Notes (Signed)
 "     05/25/2024 [name]   06/02/1940  [id number]  Primary Physician [place], [place] RIGGERS Primary Cardiologist: Dorn JINNY Lesches MD FACP, [place], [place], [place]  HPI:  [name] is a 84 y.o.   mildly overweight married Caucasian male whoI last saw him in the office 08/17/2023.[name]  He is accompanied by his wife [name] today.  He is the father of one child, grandfather to  one grandchild. I last saw him in the office 06/26/2020. 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 Leetta Laine December 27, 2004, for left main 3-vessel disease. He had a LIMA to his LAD, a vein to his circumflex marginal branch as well as to the PDA. His other problems include hypertension and hyperlipidemia. He denies chest pain or shortness of breath. He does have left ABI in the 0.85range with a high-frequency signal in his left SFA by duplex ultrasound 10/20/12 but did not claudication at that time. His last Myoview  performed 06/27/11 was nonischemic.4 years ago he developed lifestyle limiting bilateral lower extremity claudication. Should be noted that he did stop smoking at the time of his bypass surgery in 2006. He underwent peripheral angiography by myself 08/04/16 revealing a long segment calcified occlusion right SFA and high-grade diffuse calcified left SFA stenosis. I did not think that he could be percutaneously revascularized on the right, and therefore opted for medical therapy. This Pletal  has been titrated to 100 mg by mouth twice a day without significant improvement in his claudication symptoms. These are lifestyle limiting. He denies chest pain or shortness of breath.   I performed peripheral angiography and endovascular therapy of his diffusely calcified and highly obstructive left SFA using diamondback orbital rotational atherectomy followed by drug-eluting balloon plasty. This left ABI increased from 0.63-.85 and his claudication has significantly improved and were resolved.    He  had  progressive upper extremity effort angina.  He did have a negative Myoview  07/14/2019.  He saw Herlene Canterbury, PA-C in the office 04/27/2020.  His Imdur  was increased but this is not affected the frequency and/or severity of his symptoms.  He also complains of claudication bilaterally.  He has a known occluded right SFA from prior angiography status post orbital atherectomy, PTA of his subtotally occluded calcified left SFA 04/13/2017.  Recent Dopplers performed 04/26/2020 revealed a left ABI of 0.52 with an occluded left popliteal artery.   I performed right left heart cath on him 06/28/2020 revealing a left dominant system, normal LV function, patent vein to the distal left PDA, LM branch and LIMA to the LAD.  He did have a 90% apical LAD stenosis after LIMA insertion.  His hemodynamics are stable.  He says he feels better since the heart cath although I did not do an intervention.  I also performed abdominal aortography and bilateral iliac angiography revealing no obstructive disease.  He does complain of bilateral calf claudication.  The etiology of his chest pain cannot be explained by the findings on his heart cath I have reassured him and his wife [name].   I performed peripheral angiography on him 12/06/2020 revealing occluded SFAs bilaterally with three-vessel runoff.  He had a 90% calcified exophytic distal left common iliac artery stenosis.  I performed orbital atherectomy, PTA and covered stenting with an 8 mm x 29 mm long VBX covered stent postdilated with a 10 mm x 2 cm balloon.  His follow-up Doppler studies revealed unchanged ABIs although his velocities improved.  His  claudication improved as well although given his SFAs bilaterally.  He still has some lifestyle limiting claudication.  I referred him to Dr. Sheree for critical limb ischemia.  He ultimately had left common femoral and tibioperoneal trunk endarterectomy and vein angioplasty with a 6 mm PTFE bypass graft to the below the knee popliteal  artery.  His wound on his left foot has ultimately healed and he was discharged from wound care center last week.  He did have a Myoview  stress test performed 02/05/2022 that showed some anterior ischemia but I suspect this is related to his apical LAD lesion that I demonstrated at that time of cardiac catheterization 06/28/2020.  This is somewhat improved with long-acting oral nitrates which I will titrate.   He had a cardiac PET study performed 07/07/2023 that was notable for ischemia in the LAD territory and based on this he underwent outpatient cath by Dr. Mady 07/27/2023 revealing left dominant system, patent grafts, progression of left main disease jeopardizing a first diagonal branch.  He also had an apical LAD stenosis in the 90% range which is unchanged from prior caths.  There were no PCI targets.  Medical therapy was recommended.  His Imdur  was uptitrated.  He still has some angina principally at night.  He also complains of some right lower extremity claudication when walking long distances.  Since I saw him 9 months ago he continues to do well.  He still has some nocturnal angina principally when lying in bed at night which improves with sitting up.  I am wondering whether this is related to reflux.  He is on good antianginal therapy including long-acting nitrate at max dose and ranolazine .  He denies claudication.   Active Medications[1]   Allergies[2]  Social History   Socioeconomic History   Marital status: Married    Spouse name: [name]   Number of children: 1   Years of education: 12   Highest education level: 11th grade  Occupational History   Occupation: retired  Tobacco Use   Smoking status: Former    Current packs/day: 0.00    Average packs/day: 1 pack/day for 50.0 years (50.0 ttl pk-yrs)    Types: Cigarettes    Start date: 12/18/1954    Quit date: 12/17/2004    Years since quitting: 19.4   Smokeless tobacco: Never  Vaping Use   Vaping status: Never Used  Substance and  Sexual Activity   Alcohol use: Yes    Comment: occasionally   Drug use: No   Sexual activity: Not on file  Other Topics Concern   Not on file  Social History Narrative   Lives with wife   Caffeine - coffee, 1 cup daily   Social Drivers of Health   Tobacco Use: Medium Risk (05/25/2024)   Patient History    Smoking Tobacco Use: Former    Smokeless Tobacco Use: Never    Passive Exposure: Not on Actuary Strain: Low Risk (04/11/2024)   Overall Financial Resource Strain (CARDIA)    Difficulty of Paying Living Expenses: Not hard at all  Food Insecurity: No Food Insecurity (05/06/2024)   Epic    Worried About Radiation Protection Practitioner of Food in the Last Year: Never true    Ran Out of Food in the Last Year: Never true  Transportation Needs: No Transportation Needs (05/06/2024)   Epic    Lack of Transportation (Medical): No    Lack of Transportation (Non-Medical): No  Physical Activity: Inactive (05/06/2024)   Exercise Vital Sign  Days of Exercise per Week: 0 days    Minutes of Exercise per Session: 0 min  Stress: No Stress Concern Present (05/06/2024)   [employer] of Occupational Health - Occupational Stress Questionnaire    Feeling of Stress: Not at all  Social Connections: Moderately Integrated (05/06/2024)   Social Connection and Isolation Panel    Frequency of Communication with Friends and Family: More than three times a week    Frequency of Social Gatherings with Friends and Family: Twice a week    Attends Religious Services: 1 to 4 times per year    Active Member of [employer] or Organizations: No    Attends Banker Meetings: Not on file    Marital Status: Married  Catering Manager Violence: Not At Risk (05/06/2024)   Epic    Fear of Current or Ex-Partner: No    Emotionally Abused: No    Physically Abused: No    Sexually Abused: No  Depression (PHQ2-9): Low Risk (05/06/2024)   Depression (PHQ2-9)    PHQ-2 Score: 0  Alcohol Screen: Low Risk  (04/11/2024)   Alcohol Screen    Last Alcohol Screening Score (AUDIT): 1  Housing: Low Risk (05/06/2024)   Epic    Unable to Pay for Housing in the Last Year: No    Number of Times Moved in the Last Year: 0    Homeless in the Last Year: No  Utilities: Not At Risk (05/06/2024)   Epic    Threatened with loss of utilities: No  Health Literacy: Not on file     Review of Systems: General: negative for chills, fever, night sweats or weight changes.  Cardiovascular: negative for chest pain, dyspnea on exertion, edema, orthopnea, palpitations, paroxysmal nocturnal dyspnea or shortness of breath Dermatological: negative for rash Respiratory: negative for cough or wheezing Urologic: negative for hematuria Abdominal: negative for nausea, vomiting, diarrhea, bright red blood per rectum, melena, or hematemesis Neurologic: negative for visual changes, syncope, or dizziness All other systems reviewed and are otherwise negative except as noted above.    Blood pressure 108/62, pulse 63, height 6' 1 (1.854 m), SpO2 94%.  General appearance: alert and no distress Neck: no adenopathy, no carotid bruit, no JVD, supple, symmetrical, trachea midline, and thyroid  not enlarged, symmetric, no tenderness/mass/nodules Lungs: clear to auscultation bilaterally Heart: regular rate and rhythm, S1, S2 normal, no murmur, click, rub or gallop Extremities: extremities normal, atraumatic, no cyanosis or edema Pulses: Diminished pedal pulses Skin: Skin color, texture, turgor normal. No rashes or lesions Neurologic: Grossly normal  EKG EKG Interpretation Date/Time:  Wednesday May 25 2024 10:00:45 EST Ventricular Rate:  63 PR Interval:  186 QRS Duration:  74 QT Interval:  432 QTC Calculation: 442 R Axis:   10  Text Interpretation: Normal sinus rhythm Normal ECG When compared with ECG of 27-Jul-2023 17:14, PR interval has decreased Confirmed by Court Carrier [phone]) on 05/25/2024 10:09:09 AM     ASSESSMENT AND PLAN:   Hx of CABG History of CAD status post CABG by Dr. Dusty  12/27/2004 for left main/three-vessel disease.  He had a LIMA to his LAD, vein to circumflex marginal and to the PDA.  His last cath performed by Dr. Mady 07/27/2023 did reveal progression of his left main disease which jeopardized his first diagonal branch, patent LIMA to the LAD and patent vein to OM 3 and to the left PDA.  He had a left dominant system.  He does have occasional nocturnal chest pain when lying in bed.  I wonder whether this is reflux.  He is on good antianginal medications.  I am going to empirically begin him on Protonix  40 mg a day.  HTN (hypertension) History of essential hypertension blood pressure measured today at 108/62.  He is on amlodipine  and Toprol .  Dyslipidemia History of dyslipidemia on Repatha  with a lipid profile performed 11/10/2023 revealing total cholesterol of 68, LDL of 60 and HDL 41.  Peripheral vascular disease Complex history of PAD with known occlusion of both SFAs and orbital atherectomy/PTA and stenting of a calcified left iliac artery 12/06/2020.  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 Sheree who performed left common femoral and tibioperoneal trunk endarterectomy and bypass grafting with a 6 mm PT FV graft to below below the knee popliteal resulting in an healing of his wound.  Dr. Sheree  currently follows his noninvasive studies.  He denies claudication.     Dorn DOROTHA Lesches MD FACP,FACC,FAHA, [facility] 05/25/2024 10:20 AM    [1]  Current Meds  Medication Sig   amLODipine  (NORVASC ) 5 MG tablet TAKE 2 TABLETS BY MOUTH EVERY DAY   aspirin  81 MG tablet Take 81 mg by mouth daily.   Cholecalciferol  (VITAMIN D ) 50 MCG (2000 UT) tablet Take 2,000 Units by mouth daily.   clopidogrel  (PLAVIX ) 75 MG tablet TAKE 1 TABLET BY MOUTH EVERY DAY   Coenzyme Q10 (COQ10) 200 MG CAPS Take 200 mg by mouth daily.   Evolocumab  (REPATHA  SURECLICK) 140 MG/ML SOAJ INJECT 140 MG INTO THE  SKIN EVERY 14 (FOURTEEN) DAYS.   ezetimibe  (ZETIA ) 10 MG tablet Take 1 tablet (10 mg total) by mouth daily.   famotidine  (PEPCID ) 20 MG tablet TAKE 1 TABLET BY MOUTH EVERY DAY   isosorbide  mononitrate (IMDUR ) 120 MG 24 hr tablet TAKE 1 TABLET BY MOUTH EVERY DAY   levocetirizine (XYZAL ) 5 MG tablet TAKE 1 TABLET BY MOUTH EVERY DAY IN THE EVENING   metFORMIN  (GLUCOPHAGE ) 500 MG tablet TAKE 1 TABLET BY MOUTH 2 TIMES DAILY WITH A MEAL.   metoprolol  succinate (TOPROL -XL) 50 MG 24 hr tablet TAKE 1 TABLET (50 MG TOTAL) BY MOUTH TWICE A DAY .TAKE WITH OR IMMEDIATELY FOLLOWING A MEAL   nitroGLYCERIN  (NITROSTAT ) 0.4 MG SL tablet Place 1 tablet (0.4 mg total) under the tongue every 5 (five) minutes as needed for chest pain.   Omega-3 Fatty Acids (FISH OIL ) 1200 MG CAPS Take 2 capsules (2,400 mg total) by mouth every morning.   pantoprazole  (PROTONIX ) 40 MG tablet Take 1 tablet (40 mg total) by mouth daily with breakfast.   ranolazine  (RANEXA ) 500 MG 12 hr tablet Take 1 tablet (500 mg total) by mouth 2 (two) times daily.   tamsulosin  (FLOMAX ) 0.4 MG CAPS capsule TAKE 1 CAPSULE BY MOUTH EVERY DAY  [2]  Allergies Allergen Reactions   Crestor  [Rosuvastatin ] Other (See Comments)    Myalgias   Lipitor [Atorvastatin] Other (See Comments)    Myalgias    Zestril [Lisinopril] Cough   "

## 2024-05-25 NOTE — Patient Instructions (Signed)
 Medication Instructions:  Your physician has recommended you make the following change in your medication:   -Start pantoprazole  (Protonix ) 40mg  daily in the morning before breakfast.  *If you need a refill on your cardiac medications before your next appointment, please call your pharmacy*  Follow-Up: At [facility], you and your health needs are our priority.  As part of our continuing mission to provide you with exceptional heart care, our providers are all part of one team.  This team includes your primary Cardiologist (physician) and Advanced Practice Providers or APPs (Physician Assistants and Nurse Practitioners) who all work together to provide you with the care you need, when you need it.  Your next appointment:   3 month(s)  Provider:   Hao Meng, PA-C         Then, Dorn Lesches, MD will plan to see you again in 12 month(s).    We recommend signing up for the patient portal called MyChart.  Sign up information is provided on this After Visit Summary.  MyChart is used to connect with patients for Virtual Visits (Telemedicine).  Patients are able to view lab/test results, encounter notes, upcoming appointments, etc.  Non-urgent messages can be sent to your provider as well.   To learn more about what you can do with MyChart, go to [email or web address].

## 2024-05-25 NOTE — Assessment & Plan Note (Signed)
 History of essential hypertension blood pressure measured today at 108/62.  He is on amlodipine  and Toprol .

## 2024-06-16 ENCOUNTER — Other Ambulatory Visit: Payer: Self-pay | Admitting: Cardiovascular Disease

## 2024-06-16 ENCOUNTER — Other Ambulatory Visit: Payer: Self-pay | Admitting: Medical

## 2024-06-17 ENCOUNTER — Ambulatory Visit: Admitting: Podiatry

## 2024-06-17 DIAGNOSIS — M79675 Pain in left toe(s): Secondary | ICD-10-CM

## 2024-06-17 DIAGNOSIS — M79674 Pain in right toe(s): Secondary | ICD-10-CM | POA: Diagnosis not present

## 2024-06-17 DIAGNOSIS — B351 Tinea unguium: Secondary | ICD-10-CM | POA: Diagnosis not present

## 2024-06-17 NOTE — Progress Notes (Unsigned)
 Nails x10.

## 2024-06-18 ENCOUNTER — Other Ambulatory Visit: Payer: Self-pay | Admitting: Medical

## 2024-06-20 ENCOUNTER — Ambulatory Visit: Admitting: Podiatry

## 2024-06-23 ENCOUNTER — Other Ambulatory Visit: Payer: Self-pay | Admitting: Cardiovascular Disease

## 2024-06-23 DIAGNOSIS — E785 Hyperlipidemia, unspecified: Secondary | ICD-10-CM

## 2024-06-23 DIAGNOSIS — I739 Peripheral vascular disease, unspecified: Secondary | ICD-10-CM

## 2024-06-23 DIAGNOSIS — I25810 Atherosclerosis of coronary artery bypass graft(s) without angina pectoris: Secondary | ICD-10-CM

## 2024-07-07 ENCOUNTER — Ambulatory Visit: Admitting: Medical

## 2024-08-24 ENCOUNTER — Ambulatory Visit: Admitting: Physician Assistant

## 2024-09-12 ENCOUNTER — Ambulatory Visit: Admitting: Podiatry

## 2025-05-10 ENCOUNTER — Ambulatory Visit
